# Patient Record
Sex: Female | Born: 1960
Health system: Southern US, Community
[De-identification: ages and names within clinical notes are randomized; demographics above are authoritative.]

## PROBLEM LIST (undated history)

## (undated) DIAGNOSIS — D649 Anemia, unspecified: Secondary | ICD-10-CM

## (undated) DIAGNOSIS — Z923 Personal history of irradiation: Secondary | ICD-10-CM

## (undated) DIAGNOSIS — Z860101 Personal history of adenomatous and serrated colon polyps: Secondary | ICD-10-CM

## (undated) DIAGNOSIS — F32A Depression, unspecified: Secondary | ICD-10-CM

## (undated) DIAGNOSIS — D069 Carcinoma in situ of cervix, unspecified: Secondary | ICD-10-CM

## (undated) DIAGNOSIS — R7303 Prediabetes: Secondary | ICD-10-CM

## (undated) DIAGNOSIS — Z8719 Personal history of other diseases of the digestive system: Secondary | ICD-10-CM

## (undated) DIAGNOSIS — D126 Benign neoplasm of colon, unspecified: Secondary | ICD-10-CM

## (undated) DIAGNOSIS — F329 Major depressive disorder, single episode, unspecified: Secondary | ICD-10-CM

## (undated) DIAGNOSIS — R011 Cardiac murmur, unspecified: Secondary | ICD-10-CM

## (undated) DIAGNOSIS — M4800 Spinal stenosis, site unspecified: Secondary | ICD-10-CM

## (undated) DIAGNOSIS — R52 Pain, unspecified: Secondary | ICD-10-CM

## (undated) DIAGNOSIS — R251 Tremor, unspecified: Secondary | ICD-10-CM

## (undated) DIAGNOSIS — Z8669 Personal history of other diseases of the nervous system and sense organs: Secondary | ICD-10-CM

## (undated) DIAGNOSIS — M4310 Spondylolisthesis, site unspecified: Secondary | ICD-10-CM

## (undated) DIAGNOSIS — Z86010 Personal history of colonic polyps: Secondary | ICD-10-CM

## (undated) DIAGNOSIS — M1990 Unspecified osteoarthritis, unspecified site: Secondary | ICD-10-CM

## (undated) DIAGNOSIS — F419 Anxiety disorder, unspecified: Secondary | ICD-10-CM

## (undated) DIAGNOSIS — E039 Hypothyroidism, unspecified: Secondary | ICD-10-CM

## (undated) DIAGNOSIS — R87613 High grade squamous intraepithelial lesion on cytologic smear of cervix (HGSIL): Secondary | ICD-10-CM

## (undated) DIAGNOSIS — K648 Other hemorrhoids: Secondary | ICD-10-CM

## (undated) DIAGNOSIS — K449 Diaphragmatic hernia without obstruction or gangrene: Secondary | ICD-10-CM

## (undated) DIAGNOSIS — Z800 Family history of malignant neoplasm of digestive organs: Secondary | ICD-10-CM

## (undated) DIAGNOSIS — K5730 Diverticulosis of large intestine without perforation or abscess without bleeding: Secondary | ICD-10-CM

## (undated) DIAGNOSIS — K759 Inflammatory liver disease, unspecified: Secondary | ICD-10-CM

## (undated) DIAGNOSIS — Z853 Personal history of malignant neoplasm of breast: Secondary | ICD-10-CM

## (undated) DIAGNOSIS — I10 Essential (primary) hypertension: Secondary | ICD-10-CM

## (undated) DIAGNOSIS — K219 Gastro-esophageal reflux disease without esophagitis: Secondary | ICD-10-CM

## (undated) DIAGNOSIS — C801 Malignant (primary) neoplasm, unspecified: Secondary | ICD-10-CM

## (undated) HISTORY — DX: Other hemorrhoids: K64.8

## (undated) HISTORY — PX: ESOPHAGOGASTRODUODENOSCOPY: SHX1529

## (undated) HISTORY — PX: WISDOM TOOTH EXTRACTION: SHX21

## (undated) HISTORY — DX: Benign neoplasm of colon, unspecified: D12.6

## (undated) HISTORY — PX: TOOTH EXTRACTION: SUR596

## (undated) HISTORY — DX: Family history of malignant neoplasm of digestive organs: Z80.0

## (undated) HISTORY — DX: High grade squamous intraepithelial lesion on cytologic smear of cervix (HGSIL): R87.613

## (undated) HISTORY — DX: Essential (primary) hypertension: I10

## (undated) HISTORY — DX: Carcinoma in situ of cervix, unspecified: D06.9

## (undated) HISTORY — PX: COLONOSCOPY: SHX174

## (undated) HISTORY — PX: CRANIECTOMY: SHX331

## (undated) HISTORY — DX: Personal history of other diseases of the nervous system and sense organs: Z86.69

## (undated) HISTORY — DX: Pain, unspecified: R52

## (undated) HISTORY — DX: Gastro-esophageal reflux disease without esophagitis: K21.9

---

## 2000-08-16 DIAGNOSIS — C50919 Malignant neoplasm of unspecified site of unspecified female breast: Secondary | ICD-10-CM

## 2000-08-16 HISTORY — DX: Malignant neoplasm of unspecified site of unspecified female breast: C50.919

## 2000-08-16 HISTORY — PX: BREAST LUMPECTOMY: SHX2

## 2001-02-15 DIAGNOSIS — D0590 Unspecified type of carcinoma in situ of unspecified breast: Secondary | ICD-10-CM | POA: Insufficient documentation

## 2004-10-29 ENCOUNTER — Ambulatory Visit: Payer: Self-pay | Admitting: Hematology and Oncology

## 2005-03-02 ENCOUNTER — Ambulatory Visit: Payer: Self-pay | Admitting: Hematology and Oncology

## 2005-06-29 ENCOUNTER — Ambulatory Visit: Payer: Self-pay | Admitting: Hematology and Oncology

## 2005-12-13 ENCOUNTER — Ambulatory Visit: Payer: Self-pay | Admitting: Family Medicine

## 2005-12-23 ENCOUNTER — Ambulatory Visit: Payer: Self-pay | Admitting: Family Medicine

## 2006-01-17 ENCOUNTER — Ambulatory Visit: Payer: Self-pay | Admitting: Hematology and Oncology

## 2006-01-19 LAB — CBC WITH DIFFERENTIAL/PLATELET
BASO%: 0.3 % (ref 0.0–2.0)
EOS%: 2.7 % (ref 0.0–7.0)
Eosinophils Absolute: 0.1 10*3/uL (ref 0.0–0.5)
HCT: 36.6 % (ref 34.8–46.6)
LYMPH%: 37.7 % (ref 14.0–48.0)
MONO#: 0.4 10*3/uL (ref 0.1–0.9)
MONO%: 7.9 % (ref 0.0–13.0)
NEUT#: 2.3 10*3/uL (ref 1.5–6.5)
NEUT%: 51.4 % (ref 39.6–76.8)
lymph#: 1.7 10*3/uL (ref 0.9–3.3)

## 2006-01-19 LAB — CANCER ANTIGEN 27.29: CA 27.29: 13 U/mL (ref 0–39)

## 2006-01-19 LAB — COMPREHENSIVE METABOLIC PANEL
AST: 17 U/L (ref 0–37)
CO2: 28 mEq/L (ref 19–32)
Calcium: 9.2 mg/dL (ref 8.4–10.5)
Creatinine, Ser: 0.85 mg/dL (ref 0.40–1.20)
Potassium: 4 mEq/L (ref 3.5–5.3)
Sodium: 139 mEq/L (ref 135–145)
Total Bilirubin: 0.4 mg/dL (ref 0.3–1.2)

## 2006-01-31 ENCOUNTER — Ambulatory Visit: Payer: Self-pay | Admitting: Sports Medicine

## 2006-02-07 ENCOUNTER — Ambulatory Visit: Payer: Self-pay | Admitting: Family Medicine

## 2006-03-07 ENCOUNTER — Ambulatory Visit: Payer: Self-pay | Admitting: Family Medicine

## 2006-04-04 ENCOUNTER — Ambulatory Visit: Payer: Self-pay | Admitting: Family Medicine

## 2006-04-16 ENCOUNTER — Encounter (INDEPENDENT_AMBULATORY_CARE_PROVIDER_SITE_OTHER): Payer: Self-pay | Admitting: *Deleted

## 2006-04-16 LAB — CONVERTED CEMR LAB

## 2006-05-02 ENCOUNTER — Ambulatory Visit: Payer: Self-pay | Admitting: Family Medicine

## 2006-05-30 ENCOUNTER — Ambulatory Visit: Payer: Self-pay | Admitting: Family Medicine

## 2006-07-18 ENCOUNTER — Ambulatory Visit: Payer: Self-pay | Admitting: Hematology and Oncology

## 2006-07-21 LAB — CBC WITH DIFFERENTIAL/PLATELET
Basophils Absolute: 0 10*3/uL (ref 0.0–0.1)
EOS%: 2.9 % (ref 0.0–7.0)
Eosinophils Absolute: 0.1 10*3/uL (ref 0.0–0.5)
HCT: 39.3 % (ref 34.8–46.6)
HGB: 13.7 g/dL (ref 11.6–15.9)
MCH: 31.3 pg (ref 26.0–34.0)
MCHC: 34.9 g/dL (ref 32.0–36.0)
MONO%: 5.9 % (ref 0.0–13.0)
NEUT%: 55.7 % (ref 39.6–76.8)
Platelets: 291 10*3/uL (ref 145–400)
RBC: 4.38 10*6/uL (ref 3.70–5.32)
RDW: 12.4 % (ref 11.3–14.5)

## 2006-07-21 LAB — COMPREHENSIVE METABOLIC PANEL
BUN: 14 mg/dL (ref 6–23)
Chloride: 103 mEq/L (ref 96–112)
Glucose, Bld: 92 mg/dL (ref 70–99)
Sodium: 141 mEq/L (ref 135–145)
Total Protein: 7.5 g/dL (ref 6.0–8.3)

## 2006-09-12 ENCOUNTER — Encounter: Payer: Self-pay | Admitting: Family Medicine

## 2006-09-12 ENCOUNTER — Ambulatory Visit (HOSPITAL_COMMUNITY): Admission: RE | Admit: 2006-09-12 | Discharge: 2006-09-12 | Payer: Self-pay | Admitting: Family Medicine

## 2006-09-12 ENCOUNTER — Ambulatory Visit: Payer: Self-pay | Admitting: Sports Medicine

## 2006-09-12 LAB — CONVERTED CEMR LAB
HCT: 37.7 % (ref 36.0–46.0)
Hemoglobin: 12.9 g/dL (ref 12.0–15.0)
Platelets: 263 10*3/uL (ref 150–400)
RDW: 12.5 % (ref 11.5–14.0)

## 2006-09-15 ENCOUNTER — Ambulatory Visit: Payer: Self-pay | Admitting: Family Medicine

## 2006-09-15 ENCOUNTER — Encounter: Payer: Self-pay | Admitting: Family Medicine

## 2006-09-15 LAB — CONVERTED CEMR LAB
ALT: 14 units/L (ref 0–35)
Alkaline Phosphatase: 34 units/L — ABNORMAL LOW (ref 39–117)
BUN: 14 mg/dL (ref 6–23)
CO2: 26 meq/L (ref 19–32)
Calcium: 9.4 mg/dL (ref 8.4–10.5)
Chloride: 101 meq/L (ref 96–112)
Glucose, Bld: 85 mg/dL (ref 70–99)
HDL: 65 mg/dL (ref >39)
Potassium: 4.3 meq/L (ref 3.5–5.3)
Sodium: 139 meq/L (ref 135–145)
Total Bilirubin: 0.4 mg/dL (ref 0.3–1.2)
VLDL: 23 mg/dL (ref 0–40)

## 2006-10-12 ENCOUNTER — Ambulatory Visit (HOSPITAL_COMMUNITY): Admission: RE | Admit: 2006-10-12 | Discharge: 2006-10-12 | Payer: Self-pay | Admitting: Sports Medicine

## 2006-10-14 ENCOUNTER — Encounter (INDEPENDENT_AMBULATORY_CARE_PROVIDER_SITE_OTHER): Payer: Self-pay | Admitting: *Deleted

## 2006-10-17 ENCOUNTER — Ambulatory Visit: Payer: Self-pay | Admitting: Family Medicine

## 2006-10-17 DIAGNOSIS — F39 Unspecified mood [affective] disorder: Secondary | ICD-10-CM | POA: Insufficient documentation

## 2006-10-17 DIAGNOSIS — I10 Essential (primary) hypertension: Secondary | ICD-10-CM | POA: Insufficient documentation

## 2006-11-21 ENCOUNTER — Ambulatory Visit: Payer: Self-pay | Admitting: Family Medicine

## 2006-11-21 DIAGNOSIS — K2970 Gastritis, unspecified, without bleeding: Secondary | ICD-10-CM | POA: Insufficient documentation

## 2006-11-21 DIAGNOSIS — K2990 Gastroduodenitis, unspecified, without bleeding: Secondary | ICD-10-CM

## 2007-02-16 ENCOUNTER — Ambulatory Visit: Payer: Self-pay | Admitting: Hematology and Oncology

## 2007-02-23 ENCOUNTER — Encounter: Admission: RE | Admit: 2007-02-23 | Discharge: 2007-02-23 | Payer: Self-pay | Admitting: Hematology and Oncology

## 2007-03-03 LAB — COMPREHENSIVE METABOLIC PANEL
ALT: 25 U/L (ref 0–35)
AST: 21 U/L (ref 0–37)
Albumin: 4.4 g/dL (ref 3.5–5.2)
Alkaline Phosphatase: 48 U/L (ref 39–117)
BUN: 10 mg/dL (ref 6–23)
CO2: 24 mEq/L (ref 19–32)
Creatinine, Ser: 0.74 mg/dL (ref 0.40–1.20)
Glucose, Bld: 106 mg/dL — ABNORMAL HIGH (ref 70–99)
Total Protein: 7.6 g/dL (ref 6.0–8.3)

## 2007-03-03 LAB — CBC WITH DIFFERENTIAL/PLATELET
EOS%: 2.6 % (ref 0.0–7.0)
MCHC: 35.2 g/dL (ref 32.0–36.0)
MONO#: 0.3 10*3/uL (ref 0.1–0.9)
MONO%: 7.1 % (ref 0.0–13.0)
NEUT#: 2.6 10*3/uL (ref 1.5–6.5)
NEUT%: 59.4 % (ref 39.6–76.8)
lymph#: 1.4 10*3/uL (ref 0.9–3.3)

## 2007-04-06 ENCOUNTER — Encounter (INDEPENDENT_AMBULATORY_CARE_PROVIDER_SITE_OTHER): Payer: Self-pay | Admitting: Family Medicine

## 2007-04-06 ENCOUNTER — Telehealth (INDEPENDENT_AMBULATORY_CARE_PROVIDER_SITE_OTHER): Payer: Self-pay | Admitting: *Deleted

## 2007-04-06 ENCOUNTER — Ambulatory Visit: Payer: Self-pay | Admitting: Family Medicine

## 2007-04-06 DIAGNOSIS — F329 Major depressive disorder, single episode, unspecified: Secondary | ICD-10-CM

## 2007-04-06 DIAGNOSIS — R5383 Other fatigue: Secondary | ICD-10-CM | POA: Insufficient documentation

## 2007-04-07 LAB — CONVERTED CEMR LAB
Basophils Absolute: 0 10*3/uL (ref 0.0–0.1)
Eosinophils Relative: 2 % (ref 0–5)
HCT: 38.3 % (ref 36.0–46.0)
Hemoglobin: 12.7 g/dL (ref 12.0–15.0)
RBC: 4.25 M/uL (ref 3.87–5.11)

## 2007-07-18 ENCOUNTER — Ambulatory Visit: Payer: Self-pay | Admitting: Family Medicine

## 2007-07-18 DIAGNOSIS — M7710 Lateral epicondylitis, unspecified elbow: Secondary | ICD-10-CM | POA: Insufficient documentation

## 2007-07-18 DIAGNOSIS — N912 Amenorrhea, unspecified: Secondary | ICD-10-CM | POA: Insufficient documentation

## 2007-07-19 ENCOUNTER — Emergency Department (HOSPITAL_COMMUNITY): Admission: EM | Admit: 2007-07-19 | Discharge: 2007-07-19 | Payer: Self-pay | Admitting: Emergency Medicine

## 2007-07-31 ENCOUNTER — Ambulatory Visit: Payer: Self-pay | Admitting: Family Medicine

## 2007-07-31 DIAGNOSIS — G5600 Carpal tunnel syndrome, unspecified upper limb: Secondary | ICD-10-CM | POA: Insufficient documentation

## 2007-07-31 LAB — CONVERTED CEMR LAB
ALT: 24 units/L (ref 0–35)
AST: 18 units/L (ref 0–37)
Alkaline Phosphatase: 58 units/L (ref 39–117)
BUN: 15 mg/dL (ref 6–23)
CO2: 27 meq/L (ref 19–32)
Creatinine, Ser: 0.7 mg/dL (ref 0.40–1.20)
Glucose, Bld: 115 mg/dL — ABNORMAL HIGH (ref 70–99)
Potassium: 4 meq/L (ref 3.5–5.3)
Sodium: 139 meq/L (ref 135–145)

## 2007-08-02 ENCOUNTER — Encounter: Admission: RE | Admit: 2007-08-02 | Discharge: 2007-08-02 | Payer: Self-pay | Admitting: Family Medicine

## 2007-08-02 ENCOUNTER — Encounter: Payer: Self-pay | Admitting: Family Medicine

## 2007-08-21 ENCOUNTER — Encounter (INDEPENDENT_AMBULATORY_CARE_PROVIDER_SITE_OTHER): Payer: Self-pay | Admitting: *Deleted

## 2007-08-29 ENCOUNTER — Ambulatory Visit: Payer: Self-pay | Admitting: Hematology and Oncology

## 2007-08-31 LAB — LACTATE DEHYDROGENASE: LDH: 142 U/L (ref 94–250)

## 2007-08-31 LAB — CBC WITH DIFFERENTIAL/PLATELET
Basophils Absolute: 0 10*3/uL (ref 0.0–0.1)
Eosinophils Absolute: 0.1 10*3/uL (ref 0.0–0.5)
HCT: 36.3 % (ref 34.8–46.6)
HGB: 12.8 g/dL (ref 11.6–15.9)
LYMPH%: 37.5 % (ref 14.0–48.0)
MONO#: 0.3 10*3/uL (ref 0.1–0.9)
NEUT#: 2.6 10*3/uL (ref 1.5–6.5)
NEUT%: 52.5 % (ref 39.6–76.8)
Platelets: 300 10*3/uL (ref 145–400)
RBC: 4.14 10*6/uL (ref 3.70–5.32)
WBC: 5 10*3/uL (ref 3.9–10.0)

## 2007-08-31 LAB — COMPREHENSIVE METABOLIC PANEL
CO2: 24 mEq/L (ref 19–32)
Glucose, Bld: 94 mg/dL (ref 70–99)
Sodium: 137 mEq/L (ref 135–145)
Total Bilirubin: 0.3 mg/dL (ref 0.3–1.2)
Total Protein: 7.5 g/dL (ref 6.0–8.3)

## 2007-08-31 LAB — CANCER ANTIGEN 27.29: CA 27.29: 22 U/mL (ref 0–39)

## 2007-10-26 ENCOUNTER — Emergency Department (HOSPITAL_COMMUNITY): Admission: EM | Admit: 2007-10-26 | Discharge: 2007-10-26 | Payer: Self-pay | Admitting: Emergency Medicine

## 2007-12-21 ENCOUNTER — Emergency Department (HOSPITAL_COMMUNITY): Admission: EM | Admit: 2007-12-21 | Discharge: 2007-12-21 | Payer: Self-pay | Admitting: Family Medicine

## 2007-12-22 ENCOUNTER — Telehealth: Payer: Self-pay | Admitting: *Deleted

## 2008-01-15 ENCOUNTER — Telehealth: Payer: Self-pay | Admitting: *Deleted

## 2008-01-17 ENCOUNTER — Ambulatory Visit: Payer: Self-pay | Admitting: Family Medicine

## 2008-01-17 DIAGNOSIS — H9190 Unspecified hearing loss, unspecified ear: Secondary | ICD-10-CM | POA: Insufficient documentation

## 2008-02-19 ENCOUNTER — Ambulatory Visit: Payer: Self-pay | Admitting: Hematology and Oncology

## 2008-03-25 ENCOUNTER — Encounter: Admission: RE | Admit: 2008-03-25 | Discharge: 2008-03-25 | Payer: Self-pay | Admitting: Hematology and Oncology

## 2008-04-08 ENCOUNTER — Ambulatory Visit: Payer: Self-pay | Admitting: Hematology and Oncology

## 2008-07-22 ENCOUNTER — Ambulatory Visit: Payer: Self-pay | Admitting: Family Medicine

## 2008-07-22 LAB — CONVERTED CEMR LAB
Alkaline Phosphatase: 56 units/L (ref 39–117)
Creatinine, Ser: 1.37 mg/dL — ABNORMAL HIGH (ref 0.40–1.20)
HCT: 38.8 % (ref 36.0–46.0)
Hemoglobin: 13.2 g/dL (ref 12.0–15.0)
MCHC: 34 g/dL (ref 30.0–36.0)
Platelets: 315 10*3/uL (ref 150–400)
Sodium: 140 meq/L (ref 135–145)
Total Protein: 8 g/dL (ref 6.0–8.3)
WBC: 5.6 10*3/uL (ref 4.0–10.5)

## 2008-07-23 ENCOUNTER — Encounter: Payer: Self-pay | Admitting: Family Medicine

## 2008-08-20 ENCOUNTER — Ambulatory Visit: Payer: Self-pay | Admitting: Family Medicine

## 2008-08-20 ENCOUNTER — Encounter: Payer: Self-pay | Admitting: Family Medicine

## 2008-08-20 LAB — CONVERTED CEMR LAB
BUN: 20 mg/dL (ref 6–23)
CO2: 24 meq/L (ref 19–32)
Chloride: 103 meq/L (ref 96–112)
Creatinine, Ser: 0.68 mg/dL (ref 0.40–1.20)
Glucose, Bld: 95 mg/dL (ref 70–99)
Pap Smear: NORMAL

## 2008-08-22 ENCOUNTER — Encounter: Payer: Self-pay | Admitting: Family Medicine

## 2008-12-24 ENCOUNTER — Ambulatory Visit: Payer: Self-pay | Admitting: Family Medicine

## 2008-12-24 LAB — CONVERTED CEMR LAB
Bilirubin Urine: NEGATIVE
Glucose, Urine, Semiquant: NEGATIVE
Nitrite: NEGATIVE
Specific Gravity, Urine: 1.015

## 2008-12-25 ENCOUNTER — Encounter: Admission: RE | Admit: 2008-12-25 | Discharge: 2008-12-25 | Payer: Self-pay | Admitting: Family Medicine

## 2009-01-05 ENCOUNTER — Encounter: Payer: Self-pay | Admitting: Family Medicine

## 2009-04-17 ENCOUNTER — Encounter: Admission: RE | Admit: 2009-04-17 | Discharge: 2009-04-17 | Payer: Self-pay | Admitting: Family Medicine

## 2009-08-06 ENCOUNTER — Encounter: Payer: Self-pay | Admitting: Family Medicine

## 2009-08-07 ENCOUNTER — Encounter: Payer: Self-pay | Admitting: Family Medicine

## 2009-08-07 ENCOUNTER — Ambulatory Visit: Payer: Self-pay | Admitting: Family Medicine

## 2009-08-07 DIAGNOSIS — IMO0001 Reserved for inherently not codable concepts without codable children: Secondary | ICD-10-CM | POA: Insufficient documentation

## 2009-08-07 DIAGNOSIS — E785 Hyperlipidemia, unspecified: Secondary | ICD-10-CM | POA: Insufficient documentation

## 2009-08-07 LAB — CONVERTED CEMR LAB
ALT: 37 units/L — ABNORMAL HIGH (ref 0–35)
Alkaline Phosphatase: 60 units/L (ref 39–117)
CO2: 22 meq/L (ref 19–32)
Chloride: 100 meq/L (ref 96–112)
Glucose, Bld: 96 mg/dL (ref 70–99)
HCT: 38 % (ref 36.0–46.0)
MCHC: 35.5 g/dL (ref 30.0–36.0)
MCV: 88.4 fL (ref 78.0–100.0)
Platelets: 320 10*3/uL (ref 150–400)
Total Protein: 7.8 g/dL (ref 6.0–8.3)
VLDL: 16 mg/dL (ref 0–40)
WBC: 4.3 10*3/uL (ref 4.0–10.5)

## 2009-08-22 ENCOUNTER — Encounter: Payer: Self-pay | Admitting: Family Medicine

## 2010-02-04 ENCOUNTER — Telehealth: Payer: Self-pay | Admitting: Family Medicine

## 2010-02-04 ENCOUNTER — Ambulatory Visit: Payer: Self-pay | Admitting: Family Medicine

## 2010-02-04 DIAGNOSIS — R112 Nausea with vomiting, unspecified: Secondary | ICD-10-CM | POA: Insufficient documentation

## 2010-02-04 DIAGNOSIS — R51 Headache: Secondary | ICD-10-CM | POA: Insufficient documentation

## 2010-02-04 DIAGNOSIS — R519 Headache, unspecified: Secondary | ICD-10-CM | POA: Insufficient documentation

## 2010-04-07 ENCOUNTER — Telehealth: Payer: Self-pay | Admitting: Family Medicine

## 2010-08-12 ENCOUNTER — Encounter
Admission: RE | Admit: 2010-08-12 | Discharge: 2010-08-12 | Payer: Self-pay | Source: Home / Self Care | Attending: Family Medicine | Admitting: Family Medicine

## 2010-09-06 ENCOUNTER — Encounter: Payer: Self-pay | Admitting: Family Medicine

## 2010-09-15 NOTE — Progress Notes (Signed)
Summary: triage  Phone Note Call from Patient Call back at Home Phone [phone]   Caller: Patient Summary of Call: not feeling well and wants to come in Initial call taken by: De Nurse,  February 04, 2010 10:40 AM  Follow-up for Phone Call        HA, dizzy, nausea. last bp meds were last night. tylenol yesterday for HA.  crying & says she feels sad. parents are in home with her. sad x 1 day. wants to be seen today. told her to be here at 2:30 to 3pm for a work in. she is aware there will be a wait & that her md will not be here Follow-up by: Golden Circle RN,  February 04, 2010 10:53 AM

## 2010-09-15 NOTE — Progress Notes (Signed)
Summary: refill  Phone Note Refill Request Call back at Home Phone [phone] Message from:  Patient  Refills Requested: Medication #1:  EFFEXOR XR 150 MG XR24H-CAP Take one tab daily   Supply Requested: 3 months   Notes: mail order [facility] [address] - pt needs 2 wks worth- enough to get it from mail order - she is out  Initial call taken by: De Nurse,  April 07, 2010 11:13 AM  Follow-up for Phone Call        Done Follow-up by: Zachery Dauer MD,  April 08, 2010 1:53 PM
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
## 2010-09-15 NOTE — Letter (Signed)
Summary: Results Follow-up Letter  [facility]  [address]   [place], [place] [address]   Phone: [phone]  Fax: [phone]    08/22/2009  [address] [place], [place]  [address]  Dear [name],   The following are the results of your recent test(s): Patient: Sherry Anderson Note: All result statuses are Final unless otherwise noted.  Tests: (1) CBC NO Diff (Complete Blood Count) (10000)   Order Note: FASTING   WBC                       4.3 K/uL                    4.0-10.5   RBC                       4.30 MIL/uL                 3.87-5.11   Hemoglobin                13.5 g/dL                   62.9-52.8   Hematocrit                38.0 %                      36.0-46.0   MCV                       88.4 fL                     78.0-100.0   MCHC                      35.5 g/dL                   41.3-24.4   RDW                       13.4 %                      11.5-15.5   Platelet Count            320 K/uL                    150-400 No padece de anemia Tests: (2) Comprehensive Metabolic Panel (01027)   Sodium                    138 mEq/L                   135-145   Potassium                 3.8 mEq/L                   3.5-5.3   Chloride                  100 mEq/L                   96-112   CO2                       22 mEq/L  19-32   Glucose                   96 mg/dL                    84-69   BUN                       15 mg/dL                    6-29   Creatinine                0.73 mg/dL                  0.40-1.20   Bilirubin, Total          0.6 mg/dL                   5.2-8.4   Alkaline Phosphatase      60 U/L                      39-117   AST/SGOT                  26 U/L                      0-37   ALT/SGPT             [H]  37 U/L                      0-35   Total Protein             7.8 g/dL                    1.3-2.4   Albumin                   4.6 g/dL                    4.0-1.0   Calcium                   9.2 mg/dL                    2.7-25.3 [place] estan normal Tests: (3) Lipid Profile (66440)   Cholesterol          [H]  216 mg/dL                   3-474     ATP III Classification:           < 200        mg/dL        Desirable          200 - 239     mg/dL        Borderline High          >= 240        mg/dL        High         Triglyceride              81 mg/dL                    <259   HDL Cholesterol           59  mg/dL                    >98   Total Chol/HDL Ratio      3.7 Ratio  VLDL Cholesterol (Calc)                             16 mg/dL                    1-19  LDL Cholesterol (Calc)                        [H]  141 mg/dL                   1-47    Cholesterol LDL(Bad cholesterol):          Su meta es menos de: 130        HDL (Good cholesterol):        Your goal is more than: 45 Baje la grasa saturada en su dieta.   _________________________________________________________ Other Tests: Tests: (4) CK, Total (82956)   CK, Total                 74 U/L                      7-177_  No hay enfermedad de los musculos _________________________________________________________  Sincerely,  Zachery Dauer MD [facility]           Appended Document: Results Follow-up Letter mailed.

## 2010-12-23 ENCOUNTER — Ambulatory Visit (INDEPENDENT_AMBULATORY_CARE_PROVIDER_SITE_OTHER): Payer: Managed Care, Other (non HMO) | Admitting: Family Medicine

## 2010-12-23 ENCOUNTER — Encounter: Payer: Self-pay | Admitting: Family Medicine

## 2010-12-23 VITALS — BP 118/75 | HR 71 | Temp 98.1°F | Wt 150.0 lb

## 2010-12-23 DIAGNOSIS — R51 Headache: Secondary | ICD-10-CM

## 2010-12-24 NOTE — Assessment & Plan Note (Signed)
Likely mild tension headache. No red flags. Does not fit cluster or migraine pattern. Advised treat with Tylenol. Reviewed red flags. Follow up with PCP as needed.

## 2010-12-24 NOTE — Progress Notes (Signed)
  Subjective:    Patient ID: Sherry Anderson, female    DOB: [birth year], 50 y.o.   MRN: [id number]  HPI  Reports intermittent headache which is bi-temporal, not "pounding", is "dull", last for a few hours then resolves, but she has not taken anything for this headache and the headache does not wake her from sleep and headache is not severe. Also reports  itching canals of bilateral ears over past two weeks. Reports "dry throat" over past two weeks.  Denies cough, runny nose, ear pain, ear discharge, hearing loss, fever, vision change, conjunctivitis or eye itching, focal neurological signs, sinus pain or pressure .   Pertinent past history reviewed  Review of Systems As per HPI     Objective:   Physical Exam  Constitutional: She appears well-developed and well-nourished. No distress.  HENT:  Right Ear: Hearing, tympanic membrane, external ear and ear canal normal. No drainage or tenderness. No foreign bodies.  Left Ear: Hearing, tympanic membrane, external ear and ear canal normal. No drainage or tenderness. No foreign bodies.  Nose: Nose normal. No mucosal edema, rhinorrhea or sinus tenderness. Right sinus exhibits no maxillary sinus tenderness and no frontal sinus tenderness. Left sinus exhibits no maxillary sinus tenderness and no frontal sinus tenderness.  Mouth/Throat: Uvula is midline, oropharynx is clear and moist and mucous membranes are normal. No oropharyngeal exudate, posterior oropharyngeal edema or posterior oropharyngeal erythema.       No tenderness with tragal tension bilaterally   Neck: Normal range of motion. No mass and no thyromegaly present.  Cardiovascular: Normal rate and regular rhythm.   Pulmonary/Chest: Effort normal and breath sounds normal.  Lymphadenopathy:    She has no cervical adenopathy.  Skin: Skin is warm and dry.          Assessment & Plan:

## 2011-05-10 LAB — URINALYSIS, ROUTINE W REFLEX MICROSCOPIC
Hgb urine dipstick: NEGATIVE
Nitrite: NEGATIVE
Protein, ur: NEGATIVE
Urobilinogen, UA: 1

## 2011-05-10 LAB — POCT CARDIAC MARKERS
CKMB, poc: <1 — ABNORMAL LOW
CKMB, poc: <1 — ABNORMAL LOW
Operator id: 270651
Troponin i, poc: <0.05

## 2011-05-10 LAB — I-STAT 8, (EC8 V) (CONVERTED LAB)
Glucose, Bld: 130 — ABNORMAL HIGH
Potassium: 3 — ABNORMAL LOW
TCO2: 23
pCO2, Ven: 24.2 — ABNORMAL LOW
pH, Ven: 7.569 — ABNORMAL HIGH

## 2011-05-10 LAB — CBC
HCT: 37.5
Hemoglobin: 13.2
MCV: 90.4
Platelets: 253
RDW: 13.1

## 2011-05-10 LAB — POCT I-STAT CREATININE: Operator id: 270651

## 2011-05-10 LAB — HEPATIC FUNCTION PANEL
ALT: 56 — ABNORMAL HIGH
AST: 92 — ABNORMAL HIGH
Bilirubin, Direct: 0.2
Indirect Bilirubin: 0.8
Total Bilirubin: 1

## 2011-05-10 LAB — DIFFERENTIAL
Basophils Absolute: 0
Eosinophils Absolute: 0.2
Eosinophils Relative: 3
Lymphocytes Relative: 43
Lymphs Abs: 2.2
Monocytes Absolute: 0.3

## 2011-09-02 ENCOUNTER — Other Ambulatory Visit: Payer: Self-pay | Admitting: Family Medicine

## 2011-09-02 MED ORDER — TRIAMTERENE-HCTZ 37.5-25 MG PO CAPS: 1.0000 | ORAL_CAPSULE | ORAL | Status: DC

## 2011-09-02 NOTE — Telephone Encounter (Signed)
Fax sent back with one refill for 90 with no further refills until seen since no blood tests in the past years Sent to [facility]

## 2011-09-06 ENCOUNTER — Other Ambulatory Visit: Payer: Self-pay | Admitting: Family Medicine

## 2011-09-06 MED ORDER — VENLAFAXINE HCL ER 150 MG PO CP24: 150.0000 mg | ORAL_CAPSULE | Freq: Every day | ORAL | Status: DC

## 2011-09-06 MED ORDER — LISINOPRIL 20 MG PO TABS: 20.0000 mg | ORAL_TABLET | Freq: Every day | ORAL | Status: DC

## 2011-09-06 NOTE — Telephone Encounter (Signed)
[employer] faxes for Omeprazole and Venlafaxine completed for 90 days with note that no more refills until seen.

## 2011-09-10 ENCOUNTER — Other Ambulatory Visit: Payer: Self-pay | Admitting: Family Medicine

## 2011-09-10 MED ORDER — TRIAMTERENE-HCTZ 37.5-25 MG PO CAPS: 1.0000 | ORAL_CAPSULE | ORAL | Status: DC

## 2011-09-10 NOTE — Telephone Encounter (Signed)
Faxed with note that she needs to be seen before further refills. Apparently I didn't sign the prior refill.

## 2011-09-24 ENCOUNTER — Other Ambulatory Visit: Payer: Self-pay | Admitting: Family Medicine

## 2011-09-24 DIAGNOSIS — Z1231 Encounter for screening mammogram for malignant neoplasm of breast: Secondary | ICD-10-CM

## 2011-10-06 ENCOUNTER — Other Ambulatory Visit: Payer: Self-pay | Admitting: Family Medicine

## 2011-10-14 ENCOUNTER — Ambulatory Visit
Admission: RE | Admit: 2011-10-14 | Discharge: 2011-10-14 | Disposition: A | Discharge status: Home / Self Care | Payer: Managed Care, Other (non HMO) | Source: Ambulatory Visit | Attending: Family Medicine | Admitting: Family Medicine

## 2011-10-14 DIAGNOSIS — Z1231 Encounter for screening mammogram for malignant neoplasm of breast: Secondary | ICD-10-CM

## 2012-01-05 ENCOUNTER — Telehealth: Payer: Self-pay | Admitting: Family Medicine

## 2012-01-05 NOTE — Telephone Encounter (Signed)
Fax requests for refills of Maxide and Venlafaxine denied due to no visits of labs in over a year.

## 2012-01-17 ENCOUNTER — Ambulatory Visit: Payer: Managed Care, Other (non HMO)

## 2012-01-19 ENCOUNTER — Ambulatory Visit (INDEPENDENT_AMBULATORY_CARE_PROVIDER_SITE_OTHER): Payer: Managed Care, Other (non HMO) | Admitting: Family Medicine

## 2012-01-19 ENCOUNTER — Encounter: Payer: Self-pay | Admitting: Family Medicine

## 2012-01-19 VITALS — BP 118/77 | HR 88 | Temp 97.7°F | Ht 63.25 in | Wt 155.8 lb

## 2012-01-19 DIAGNOSIS — D649 Anemia, unspecified: Secondary | ICD-10-CM

## 2012-01-19 DIAGNOSIS — H9201 Otalgia, right ear: Secondary | ICD-10-CM

## 2012-01-19 DIAGNOSIS — H9209 Otalgia, unspecified ear: Secondary | ICD-10-CM

## 2012-01-19 DIAGNOSIS — I10 Essential (primary) hypertension: Secondary | ICD-10-CM

## 2012-01-19 NOTE — Progress Notes (Signed)
  Subjective:  1. Right ear pain, right throat pain   Sherry Anderson is a 51 y.o. female who presents with ear pain and possible ear infection. Symptoms include: right ear pain, sore throat and swollen glands. Onset of symptoms was 3 days ago, and have been gradually worsening since that time. Associated symptoms include: none.  Patient denies: chills, congestion, coryza, fever , headache, non productive cough, post nasal drip, productive cough, sinus pressure and sneezing. She is drinking plenty of fluids.  Review of Systems Pertinent items are noted in HPI.  Objective:    BP 118/77  Pulse 88  Temp(Src) 97.7 F (36.5 C) (Oral)  Ht 5' 3.25" (1.607 m)  Wt 155 lb 12.8 oz (70.67 kg)  BMI 27.38 kg/m2 General:  alert and cooperative  Right Ear: normal landmarks and mobility ear canal normal without redness  Left Ear: normal landmarks and mobility  Mouth:  lips, mucosa, and tongue normal; teeth and gums normal  Neck: no adenopathy, no carotid bruit, no JVD, supple, symmetrical, trachea midline and thyroid not enlarged, symmetric, no tenderness/mass/nodules     Assessment:

## 2012-01-19 NOTE — Assessment & Plan Note (Signed)
No sign of infection in ear/throat/sinus. No allergy symptoms.  I think she may have a atypical viral illness.  Supportive treatment with saline gargles and warmth/fluids.

## 2012-01-24 ENCOUNTER — Encounter: Payer: Self-pay | Admitting: Family Medicine

## 2012-01-24 ENCOUNTER — Ambulatory Visit (INDEPENDENT_AMBULATORY_CARE_PROVIDER_SITE_OTHER): Payer: Managed Care, Other (non HMO) | Admitting: Family Medicine

## 2012-01-24 VITALS — BP 132/81 | HR 60 | Ht 63.25 in | Wt 157.0 lb

## 2012-01-24 DIAGNOSIS — D649 Anemia, unspecified: Secondary | ICD-10-CM

## 2012-01-24 DIAGNOSIS — K2960 Other gastritis without bleeding: Secondary | ICD-10-CM

## 2012-01-24 DIAGNOSIS — R109 Unspecified abdominal pain: Secondary | ICD-10-CM | POA: Insufficient documentation

## 2012-01-24 DIAGNOSIS — F329 Major depressive disorder, single episode, unspecified: Secondary | ICD-10-CM

## 2012-01-24 DIAGNOSIS — I10 Essential (primary) hypertension: Secondary | ICD-10-CM

## 2012-01-24 DIAGNOSIS — D0590 Unspecified type of carcinoma in situ of unspecified breast: Secondary | ICD-10-CM

## 2012-01-24 DIAGNOSIS — H9201 Otalgia, right ear: Secondary | ICD-10-CM

## 2012-01-24 DIAGNOSIS — R51 Headache: Secondary | ICD-10-CM

## 2012-01-24 DIAGNOSIS — H9209 Otalgia, unspecified ear: Secondary | ICD-10-CM

## 2012-01-24 LAB — POCT URINALYSIS DIPSTICK
Bilirubin, UA: NEGATIVE
Blood, UA: NEGATIVE
Glucose, UA: NEGATIVE
Spec Grav, UA: 1.02

## 2012-01-24 MED ORDER — TRIAMTERENE-HCTZ 37.5-25 MG PO CAPS: 1.0000 | ORAL_CAPSULE | Freq: Every day | ORAL | Status: DC

## 2012-01-24 MED ORDER — OMEPRAZOLE 20 MG PO CPDR: 20.0000 mg | DELAYED_RELEASE_CAPSULE | Freq: Two times a day (BID) | ORAL | Status: DC

## 2012-01-24 MED ORDER — VENLAFAXINE HCL ER 150 MG PO CP24: 150.0000 mg | ORAL_CAPSULE | Freq: Every day | ORAL | Status: DC

## 2012-01-24 MED ORDER — LISINOPRIL 20 MG PO TABS: 20.0000 mg | ORAL_TABLET | Freq: Every day | ORAL | Status: DC

## 2012-01-24 NOTE — Assessment & Plan Note (Signed)
I considered renal colic as the cause of her pains, but no hematuria

## 2012-01-24 NOTE — Assessment & Plan Note (Signed)
Controlled by Omeprazole.

## 2012-01-24 NOTE — Assessment & Plan Note (Signed)
Still controlled one week off of the HCTZ. No proteinuria to explain the facial puffiness

## 2012-01-24 NOTE — Assessment & Plan Note (Signed)
May be component of TMJ syndrome

## 2012-01-24 NOTE — Patient Instructions (Addendum)
We will continue your blood pressure medicines the same, unless the lab tests indicate that we need to change.   Please return to see Dr Sheffield Slider in 3 months  Favor de regresar en 3 meses.  Voy a [employer] [place] [place]. I'll send you the results of your tests.

## 2012-01-24 NOTE — Assessment & Plan Note (Signed)
Due to TMJ? No otitis

## 2012-01-24 NOTE — Progress Notes (Signed)
  Subjective:    Patient ID: Sherry Anderson, female    DOB: 17-Jun-1961, 51 y.o.   MRN: [id number]  HPI 7 days of facial and ankle edema since she has been off the Triamterene/HCTZ which I wouldn't refill until she came in for labs.   Syncopal episode after severe midabdominal pain with sweating that was gone when she awakened. She bruised her left cheek and R knee.   Intermittent LUQ pain not associated with eating or movement. No dysuria, constipation, or diarrhea.   She continues headaches. Ear pain is better, but it recurs.   Missed one day of Venlafaxine, very anxious, and isn't interested in weaning off of it.  Review of Systems     see HPI for ROS  Objective:   Physical Exam  Constitutional: She is oriented to person, place, and time. She appears well-developed and well-nourished.       She does appear puffy in the face  HENT:  Right Ear: External ear normal.  Left Ear: External ear normal.  Eyes:       Fundi normal, disks flat.   Cardiovascular: Normal rate and regular rhythm.   Pulmonary/Chest: Effort normal and breath sounds normal.  Abdominal: Soft. She exhibits no distension and no mass. There is no tenderness. There is no guarding.  Musculoskeletal: She exhibits no edema.  Neurological: She is alert and oriented to person, place, and time.  Psychiatric:       Mildly dysphoric affect as usual          Assessment & Plan:

## 2012-01-24 NOTE — Assessment & Plan Note (Signed)
She wishes to continue the Venlafaxine

## 2012-01-25 ENCOUNTER — Encounter: Payer: Self-pay | Admitting: Family Medicine

## 2012-01-25 DIAGNOSIS — D649 Anemia, unspecified: Secondary | ICD-10-CM | POA: Insufficient documentation

## 2012-01-25 LAB — COMPREHENSIVE METABOLIC PANEL
Albumin: 4 g/dL (ref 3.5–5.2)
BUN: 12 mg/dL (ref 6–23)
Calcium: 9.1 mg/dL (ref 8.4–10.5)
Chloride: 108 mEq/L (ref 96–112)
Glucose, Bld: 87 mg/dL (ref 70–99)
Potassium: 3.8 mEq/L (ref 3.5–5.3)

## 2012-01-25 LAB — CBC
Hemoglobin: 11.1 g/dL — ABNORMAL LOW (ref 12.0–15.0)
RBC: 3.71 MIL/uL — ABNORMAL LOW (ref 3.87–5.11)

## 2012-01-25 NOTE — Assessment & Plan Note (Signed)
Creatinine, K, and UA are normal

## 2012-01-25 NOTE — Assessment & Plan Note (Signed)
She had been given Guaiac cards. Letter sent to come in for CBC in about a month to see if back to normal.

## 2012-02-07 ENCOUNTER — Telehealth: Payer: Self-pay | Admitting: Family Medicine

## 2012-02-07 DIAGNOSIS — I10 Essential (primary) hypertension: Secondary | ICD-10-CM

## 2012-02-07 MED ORDER — TRIAMTERENE-HCTZ 37.5-25 MG PO CAPS: 1.0000 | ORAL_CAPSULE | Freq: Every day | ORAL | Status: DC

## 2012-02-07 NOTE — Telephone Encounter (Signed)
Patient is calling because [facility] [address] did not receive the Rx for triamterene-hydrochlorothiazide And she needs her medication.  Please call her when this has been addressed.

## 2012-02-07 NOTE — Telephone Encounter (Signed)
Called pt/ informed meds are at the pharmacy. Lorenda Hatchet, Renato Battles

## 2012-02-24 ENCOUNTER — Other Ambulatory Visit: Payer: Self-pay | Admitting: Family Medicine

## 2012-02-24 DIAGNOSIS — I10 Essential (primary) hypertension: Secondary | ICD-10-CM

## 2012-02-24 MED ORDER — OMEPRAZOLE 20 MG PO CPDR: 20.0000 mg | DELAYED_RELEASE_CAPSULE | Freq: Two times a day (BID) | ORAL | Status: DC

## 2012-02-24 MED ORDER — VENLAFAXINE HCL ER 150 MG PO CP24: 150.0000 mg | ORAL_CAPSULE | Freq: Every day | ORAL | Status: DC

## 2012-02-24 MED ORDER — TRIAMTERENE-HCTZ 37.5-25 MG PO CAPS: 1.0000 | ORAL_CAPSULE | Freq: Every day | ORAL | Status: DC

## 2012-02-24 NOTE — Telephone Encounter (Signed)
The prescriptions were printed and attached to the forms from [facility] Prescriptions to be faxed to them

## 2012-03-03 LAB — HEMOCCULT GUIAC POC 1CARD (OFFICE)
Card #3 Fecal Occult Blood, POC: NEGATIVE
Fecal Occult Blood, POC: NEGATIVE

## 2012-03-03 NOTE — Addendum Note (Signed)
Addended by: [place], Brayton Baumgartner on: 03/03/2012 05:37 PM   Modules accepted: Orders

## 2012-03-09 ENCOUNTER — Encounter: Payer: Self-pay | Admitting: Family Medicine

## 2012-03-30 ENCOUNTER — Ambulatory Visit (HOSPITAL_COMMUNITY)
Admission: RE | Admit: 2012-03-30 | Discharge: 2012-03-30 | Disposition: A | Discharge status: Home / Self Care | Payer: Managed Care, Other (non HMO) | Source: Ambulatory Visit | Attending: Family Medicine | Admitting: Family Medicine

## 2012-03-30 ENCOUNTER — Telehealth: Payer: Self-pay | Admitting: *Deleted

## 2012-03-30 ENCOUNTER — Ambulatory Visit (INDEPENDENT_AMBULATORY_CARE_PROVIDER_SITE_OTHER): Payer: Managed Care, Other (non HMO) | Admitting: Family Medicine

## 2012-03-30 VITALS — BP 144/90 | HR 60 | Ht 63.25 in | Wt 158.4 lb

## 2012-03-30 DIAGNOSIS — I498 Other specified cardiac arrhythmias: Secondary | ICD-10-CM | POA: Insufficient documentation

## 2012-03-30 DIAGNOSIS — I209 Angina pectoris, unspecified: Secondary | ICD-10-CM

## 2012-03-30 DIAGNOSIS — R079 Chest pain, unspecified: Secondary | ICD-10-CM

## 2012-03-30 DIAGNOSIS — I208 Other forms of angina pectoris: Secondary | ICD-10-CM | POA: Insufficient documentation

## 2012-03-30 DIAGNOSIS — I2089 Other forms of angina pectoris: Secondary | ICD-10-CM

## 2012-03-30 DIAGNOSIS — I10 Essential (primary) hypertension: Secondary | ICD-10-CM

## 2012-03-30 DIAGNOSIS — R9431 Abnormal electrocardiogram [ECG] [EKG]: Secondary | ICD-10-CM | POA: Insufficient documentation

## 2012-03-30 MED ORDER — ASPIRIN EC 81 MG PO TBEC: 81.0000 mg | DELAYED_RELEASE_TABLET | Freq: Every day | ORAL | Status: AC

## 2012-03-30 NOTE — Assessment & Plan Note (Signed)
A: elevated. Improved with rest to 144/90.  Meds: compliant  P: continue current management. Close f/u with PCP. I believe anxiety is playing a major role in her presentation Mild to moderate exercise (stress relief) recommended.

## 2012-03-30 NOTE — Assessment & Plan Note (Addendum)
A: sinus bradycardia on EKG. No evidence of old or current ischemia. Patient's symptoms concerning for angina vs anxiety. Her CAD risk factors include HTN and HLD. P: Cardiology referral for exercise stress test  Start daily aspirin  Hold on BB for pending EST and because of sinus bradycardia Close f/u with PCP in two weeks

## 2012-03-30 NOTE — Telephone Encounter (Signed)
Patient calls reporting BP elevation past 2 days 168/100 to 184/100. She is taking medication as directed. . Denies chest pain or headache, sometimes has vision problem. Appointment scheduled today for Work in appointment.

## 2012-03-30 NOTE — Patient Instructions (Signed)
Sherry Anderson,  Thank you for coming in today. Your blood pressure has improved to 144/90. We will get an EKG today. I am sending you for an exercise stress test because of your symptoms of chest pain and shortness of breath with exertion.  Additionally, please start a daily aspirin.   Please f/u in 2 weeks with Dr. Sheffield Slider. He recommend 04/10/12 late [facility].   Dr. Armen Pickup

## 2012-03-30 NOTE — Progress Notes (Signed)
Subjective:     Patient ID: Sherry Anderson, female   DOB: 09-Mar-1961, 51 y.o.   MRN: [id number]  HPI 51 yo F presents for elevated blood pressure x 3 days. She took her BP at home: SBP range 168-186 and DBP ~ 100. She denies symptoms currently but admits to chest pressure of dyspnea on exertion. She denies HA, vision changes, cough  and LE edema. She is compliant with her antihypertensives and a low salt diet. She does not exercise regularly. She admits to increases social stress/presurres regarding caring for her elderly parents and finding a new job as her current will soon be relocating 1.5 hrs away to [place], [facility].   Review of Systems As per HPI    Objective:   Physical Exam BP 175/98 BP 144/90  Pulse 60  Ht 5' 3.25" (1.607 m)  Wt 158 lb 6.4 oz (71.85 kg)  BMI 27.84 kg/m2 General appearance: alert, cooperative and no distress Eyes: conjunctivae/corneas clear. PERRL, EOM's intact.  Neck: no adenopathy, no carotid bruit, no JVD, supple, symmetrical, trachea midline and thyroid not enlarged, symmetric, no tenderness/mass/nodules Lungs: clear to auscultation bilaterally Heart: regular rate and rhythm, S1, S2 normal, no murmur, click, rub or gallop Extremities: extremities normal, atraumatic, no cyanosis or edema  EKG: no previous EKG, sinus bradycardia.     Assessment and Plan:

## 2012-04-10 ENCOUNTER — Ambulatory Visit: Payer: Managed Care, Other (non HMO)

## 2012-04-14 ENCOUNTER — Encounter: Payer: Self-pay | Admitting: *Deleted

## 2012-04-14 ENCOUNTER — Encounter: Payer: Self-pay | Admitting: Family Medicine

## 2012-04-14 NOTE — Telephone Encounter (Signed)
This encounter was created in error - please disregard.

## 2012-04-14 NOTE — Telephone Encounter (Signed)
error 

## 2012-04-20 ENCOUNTER — Ambulatory Visit: Payer: Managed Care, Other (non HMO) | Admitting: Cardiology

## 2012-09-26 ENCOUNTER — Telehealth: Payer: Self-pay | Admitting: Family Medicine

## 2012-09-26 ENCOUNTER — Other Ambulatory Visit: Payer: Self-pay | Admitting: *Deleted

## 2012-09-26 DIAGNOSIS — I10 Essential (primary) hypertension: Secondary | ICD-10-CM

## 2012-09-26 MED ORDER — LISINOPRIL 20 MG PO TABS: 20.0000 mg | ORAL_TABLET | Freq: Every day | ORAL | Status: DC

## 2012-09-26 NOTE — Telephone Encounter (Signed)
Pt does not have any lisinopril and pharmacy has no record (pt was getting it from mail order) of it and it needs to be called to [address]. Pt has appointment on Friday but needs this today. Please Advise.  JS, SMA

## 2012-09-26 NOTE — Telephone Encounter (Signed)
Called in Lisinopril and called pt and informed. Lorenda Hatchet, Renato Battles

## 2012-09-29 ENCOUNTER — Ambulatory Visit: Payer: Managed Care, Other (non HMO) | Admitting: Family Medicine

## 2012-10-24 ENCOUNTER — Ambulatory Visit (INDEPENDENT_AMBULATORY_CARE_PROVIDER_SITE_OTHER): Payer: Commercial Managed Care - PPO | Admitting: Family Medicine

## 2012-10-24 ENCOUNTER — Other Ambulatory Visit (HOSPITAL_COMMUNITY)
Admission: RE | Admit: 2012-10-24 | Discharge: 2012-10-24 | Disposition: A | Discharge status: Home / Self Care | Payer: Commercial Managed Care - PPO | Source: Ambulatory Visit | Attending: Family Medicine | Admitting: Family Medicine

## 2012-10-24 VITALS — BP 124/80 | HR 71 | Wt 149.5 lb

## 2012-10-24 DIAGNOSIS — Z124 Encounter for screening for malignant neoplasm of cervix: Secondary | ICD-10-CM

## 2012-10-24 DIAGNOSIS — Z01419 Encounter for gynecological examination (general) (routine) without abnormal findings: Secondary | ICD-10-CM | POA: Insufficient documentation

## 2012-10-24 DIAGNOSIS — Z1151 Encounter for screening for human papillomavirus (HPV): Secondary | ICD-10-CM | POA: Insufficient documentation

## 2012-10-24 DIAGNOSIS — Z23 Encounter for immunization: Secondary | ICD-10-CM

## 2012-10-24 DIAGNOSIS — E785 Hyperlipidemia, unspecified: Secondary | ICD-10-CM

## 2012-10-24 DIAGNOSIS — F329 Major depressive disorder, single episode, unspecified: Secondary | ICD-10-CM

## 2012-10-24 DIAGNOSIS — R87810 Cervical high risk human papillomavirus (HPV) DNA test positive: Secondary | ICD-10-CM | POA: Insufficient documentation

## 2012-10-24 DIAGNOSIS — D649 Anemia, unspecified: Secondary | ICD-10-CM

## 2012-10-24 DIAGNOSIS — K2960 Other gastritis without bleeding: Secondary | ICD-10-CM

## 2012-10-24 NOTE — Patient Instructions (Addendum)
Return Friday fasting for blood tests  I'll call you with the results or send by computer  Please return to see Dr Sheffield Slider in 3months.

## 2012-10-25 ENCOUNTER — Encounter: Payer: Self-pay | Admitting: Family Medicine

## 2012-10-25 NOTE — Assessment & Plan Note (Signed)
Still occurs at times despite Omeprazole

## 2012-10-25 NOTE — Assessment & Plan Note (Signed)
Continues her low dose Venlafaxine. Consider higher dose

## 2012-10-25 NOTE — Assessment & Plan Note (Signed)
No current menses or signs of blood in bowel movements. Will repeat CBC with ferritin

## 2012-10-25 NOTE — Progress Notes (Signed)
  Subjective:    Patient ID: Sherry Anderson, female    DOB: 03-Jun-1961, 52 y.o.   MRN: [id number]  HPI Derpression - She continues to be stressed by worries about caring for her parents who are never satisfied, and about her sons stationed in [place] and [place].   Menopausal - no menses for years. Agrees to pap smear.   Anemia - No signs of blood in her bowel movements. Review of Systems     Objective:   Physical Exam  Neck: No thyromegaly present.  Cardiovascular: Normal rate and regular rhythm.   No murmur heard. Pulmonary/Chest: Effort normal and breath sounds normal. She has no rales.  Abdominal: Soft. She exhibits no distension and no mass. There is no tenderness. There is no rebound and no guarding.  Genitourinary: Vagina normal and uterus normal. Guaiac negative stool. No vaginal discharge found.  Lymphadenopathy:    She has no cervical adenopathy.  Psychiatric: Her behavior is normal. Judgment and thought content normal.  Dysphoric affect as usual when stresses are brought up          Assessment & Plan:

## 2012-10-30 ENCOUNTER — Other Ambulatory Visit: Payer: Commercial Managed Care - PPO

## 2012-10-30 ENCOUNTER — Other Ambulatory Visit: Payer: Self-pay | Admitting: Family Medicine

## 2012-10-30 DIAGNOSIS — F329 Major depressive disorder, single episode, unspecified: Secondary | ICD-10-CM

## 2012-10-30 DIAGNOSIS — D649 Anemia, unspecified: Secondary | ICD-10-CM

## 2012-10-30 DIAGNOSIS — E785 Hyperlipidemia, unspecified: Secondary | ICD-10-CM

## 2012-10-30 LAB — CBC
HCT: 35.5 % — ABNORMAL LOW (ref 36.0–46.0)
Hemoglobin: 12.4 g/dL (ref 12.0–15.0)
RDW: 13.7 % (ref 11.5–15.5)
WBC: 4.4 10*3/uL (ref 4.0–10.5)

## 2012-10-30 LAB — LIPID PANEL
HDL: 60 mg/dL (ref >39)
LDL Cholesterol: 146 mg/dL — ABNORMAL HIGH (ref 0–99)

## 2012-10-30 LAB — IRON AND TIBC
TIBC: 380 ug/dL (ref 250–470)
UIBC: 299 ug/dL (ref 125–400)

## 2012-10-30 NOTE — Progress Notes (Signed)
FLP,CBC.TSH,FERRIRIN AND TIBC DONE TODAY Sherry Anderson

## 2012-11-01 ENCOUNTER — Other Ambulatory Visit: Payer: Self-pay | Admitting: *Deleted

## 2012-11-01 DIAGNOSIS — I10 Essential (primary) hypertension: Secondary | ICD-10-CM

## 2012-11-02 LAB — COMPREHENSIVE METABOLIC PANEL
ALT: 16 U/L (ref 0–35)
CO2: 28 mEq/L (ref 19–32)
Calcium: 10 mg/dL (ref 8.4–10.5)
Chloride: 102 mEq/L (ref 96–112)
Creat: 0.76 mg/dL (ref 0.50–1.10)
Glucose, Bld: 99 mg/dL (ref 70–99)
Total Bilirubin: 0.2 mg/dL — ABNORMAL LOW (ref 0.3–1.2)
Total Protein: 7.5 g/dL (ref 6.0–8.3)

## 2012-11-06 MED ORDER — LISINOPRIL 20 MG PO TABS: 20.0000 mg | ORAL_TABLET | Freq: Every day | ORAL | Status: DC

## 2012-11-06 MED ORDER — OMEPRAZOLE 20 MG PO CPDR: 20.0000 mg | DELAYED_RELEASE_CAPSULE | Freq: Two times a day (BID) | ORAL | Status: DC

## 2012-11-06 MED ORDER — TRIAMTERENE-HCTZ 37.5-25 MG PO CAPS: 1.0000 | ORAL_CAPSULE | Freq: Every day | ORAL | Status: DC

## 2012-11-06 MED ORDER — VENLAFAXINE HCL ER 150 MG PO CP24: 150.0000 mg | ORAL_CAPSULE | Freq: Every day | ORAL | Status: DC

## 2012-11-08 ENCOUNTER — Encounter: Payer: Self-pay | Admitting: Family Medicine

## 2012-12-13 ENCOUNTER — Encounter: Payer: Self-pay | Admitting: Family Medicine

## 2013-03-02 ENCOUNTER — Encounter: Payer: Self-pay | Admitting: Family Medicine

## 2013-03-02 ENCOUNTER — Ambulatory Visit (INDEPENDENT_AMBULATORY_CARE_PROVIDER_SITE_OTHER): Payer: Commercial Managed Care - PPO | Admitting: Family Medicine

## 2013-03-02 VITALS — BP 120/79 | HR 71 | Temp 98.2°F | Ht 63.25 in | Wt 150.2 lb

## 2013-03-02 DIAGNOSIS — M25532 Pain in left wrist: Secondary | ICD-10-CM

## 2013-03-02 DIAGNOSIS — R5383 Other fatigue: Secondary | ICD-10-CM

## 2013-03-02 DIAGNOSIS — M25539 Pain in unspecified wrist: Secondary | ICD-10-CM

## 2013-03-02 DIAGNOSIS — R5381 Other malaise: Secondary | ICD-10-CM

## 2013-03-02 LAB — COMPREHENSIVE METABOLIC PANEL
AST: 24 U/L (ref 0–37)
Albumin: 4.4 g/dL (ref 3.5–5.2)
Alkaline Phosphatase: 57 U/L (ref 39–117)
Calcium: 9.8 mg/dL (ref 8.4–10.5)
Chloride: 102 mEq/L (ref 96–112)
Potassium: 3.9 mEq/L (ref 3.5–5.3)
Sodium: 138 mEq/L (ref 135–145)
Total Protein: 7.8 g/dL (ref 6.0–8.3)

## 2013-03-02 LAB — CBC
HCT: 40.2 % (ref 36.0–46.0)
Hemoglobin: 13.9 g/dL (ref 12.0–15.0)
MCHC: 34.6 g/dL (ref 30.0–36.0)
RDW: 13.4 % (ref 11.5–15.5)
WBC: 5.6 10*3/uL (ref 4.0–10.5)

## 2013-03-02 LAB — TSH: TSH: 3.164 u[IU]/mL (ref 0.350–4.500)

## 2013-03-02 NOTE — Progress Notes (Signed)
  Subjective:    Patient ID: Sherry Anderson, female    DOB: 1961/07/09, 52 y.o.   MRN: [id number]  HPI  Visit conducted in Spanish.  Patient presents with concern about chronic feeling of tiredness and fatigue.  She has felt without energy for several months; cannot associate with a particular event.  Maintains good sleep hygiene, goes to bed at 9pm and awakens at 430am to prepare for work.  She gets up 1-2x/night to use the bathroom.  She does not drink excessive caffeine before bed.  She does not smoke or drink (was never a smoker).    Also complaint of pain along the ulnar aspect of her left wrist, on the bony prominence/styloid process of ulna.  No trauma, unclear what triggered. No radiation of pain. No weakness of L wrist or hand.   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  She started the job last Nov.  She previously worked in a similar job that paid her $13/hr, but that company moved to [place] and the commute was too far for her.  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  She cares for her parents, in their 80s/90s.  She expresses frustration that she uses most of her days off to take them to the doctors office.   PMHx; Surgical menopause 12 years ago (hysterectomy/oophorectomy).  She was diagnosed with breast cancer 12 yrs ago and had surgery (she describes what sounds like lumpectomy), radiation therapy and chemotherapy at age 2.  Has annual mammography (screening) with last one being Feb 2013 (normal).   Review of Systems  Is on Effexor for diagnosis of depression; feels that her depression is reasonably well controlled. If she misses a dose of Effexor she feels very ill (nausea, generalized malaise). Denies chest pain or palpitations. Does feel sweats from time to time.      Objective:   Physical Exam Well appearing, no apparent distress HEENT Neck supple. Thyroid supple without nodules or tenderness.   COR Regular S1S2, no extra sounds PULM Clear bilaterally, no rales or wheezes        Assessment & Plan:

## 2013-03-02 NOTE — Assessment & Plan Note (Signed)
Pain over styloid process, no radiation of pain.  No pain to palpate ulnar groove.  Some minor prominence of L ulnar styloid when compared with R side. For x-ray, to consider arthritic cause.  No findings to suggest peripheral neuropathy.

## 2013-03-02 NOTE — Assessment & Plan Note (Signed)
Patient presents with worsening fatigue; she is dealing with the care of her elderly parents and trying to get by with a reduction in her income (hourly wage decrease from $13/hr to $10/hr).  No other family in immediate area. Prior diagnosis of depression.  Plan to check thyroid function, CBC and metabolic profile.  Consider change from Effexor to SSRI; PHQ9 at next visit (in [place]).  Her home number for calls is [phone].

## 2013-03-07 ENCOUNTER — Telehealth: Payer: Self-pay | Admitting: Family Medicine

## 2013-03-07 NOTE — Telephone Encounter (Signed)
I called and left message in Spanish regarding lab results. JB

## 2013-03-22 ENCOUNTER — Telehealth: Payer: Self-pay | Admitting: Family Medicine

## 2013-03-22 NOTE — Telephone Encounter (Signed)
Called patient to report her recent labs; TSH/CBC/CMet all WNL with exception of fasting glucose 103.  She says she continues with tiredness during the day. Recent event where her son had trouble waking her while she was asleep on sofa, said she snored very loudly and even had stopped breathing for a moment.  Will consider Epworth Sleepiness Scale and possibly polysomnography as next step in workup.  She continues on Effexor which she says has been helpful for her mood. JB

## 2013-03-23 ENCOUNTER — Ambulatory Visit: Payer: Commercial Managed Care - PPO | Admitting: Family Medicine

## 2013-03-30 ENCOUNTER — Ambulatory Visit: Payer: Commercial Managed Care - PPO | Admitting: Family Medicine

## 2013-04-20 ENCOUNTER — Other Ambulatory Visit: Payer: Self-pay | Admitting: Family Medicine

## 2013-07-04 ENCOUNTER — Other Ambulatory Visit: Payer: Self-pay | Admitting: Family Medicine

## 2013-08-13 ENCOUNTER — Telehealth: Payer: Self-pay | Admitting: Family Medicine

## 2013-08-13 MED ORDER — VENLAFAXINE HCL ER 150 MG PO CP24: 150.0000 mg | ORAL_CAPSULE | Freq: Every day | ORAL | Status: DC

## 2013-08-13 MED ORDER — OMEPRAZOLE 20 MG PO CPDR: 20.0000 mg | DELAYED_RELEASE_CAPSULE | Freq: Two times a day (BID) | ORAL | Status: DC

## 2013-08-13 NOTE — Telephone Encounter (Signed)
Will fwd to MD.  Katrisha Segall L, CMA  

## 2013-08-13 NOTE — Telephone Encounter (Signed)
Refills sent to [facility] 

## 2013-08-13 NOTE — Telephone Encounter (Signed)
Patient request refill on Effexor XR and Prilosec. Patient usually get meds by mail but is almost completely out. Needs refills sent to [facility] on [address]

## 2013-08-14 NOTE — Telephone Encounter (Signed)
Attempted to notify patient but her mailbox is full.  Tran Arzuaga, Darlyne Russian, CMA

## 2013-10-15 ENCOUNTER — Other Ambulatory Visit: Payer: Self-pay | Admitting: Family Medicine

## 2013-10-23 ENCOUNTER — Ambulatory Visit: Payer: Commercial Managed Care - PPO | Admitting: Family Medicine

## 2013-11-05 ENCOUNTER — Other Ambulatory Visit: Payer: Self-pay

## 2013-11-05 DIAGNOSIS — Z1231 Encounter for screening mammogram for malignant neoplasm of breast: Secondary | ICD-10-CM

## 2013-11-05 DIAGNOSIS — Z9889 Other specified postprocedural states: Secondary | ICD-10-CM

## 2013-11-06 ENCOUNTER — Ambulatory Visit (INDEPENDENT_AMBULATORY_CARE_PROVIDER_SITE_OTHER): Payer: Commercial Managed Care - PPO | Admitting: Family Medicine

## 2013-11-06 ENCOUNTER — Encounter: Payer: Self-pay | Admitting: Family Medicine

## 2013-11-06 ENCOUNTER — Ambulatory Visit
Admission: RE | Admit: 2013-11-06 | Discharge: 2013-11-06 | Disposition: A | Discharge status: Home / Self Care | Payer: Commercial Managed Care - PPO | Source: Ambulatory Visit | Attending: Family Medicine | Admitting: Family Medicine

## 2013-11-06 VITALS — BP 129/81 | HR 65 | Temp 97.7°F | Ht 63.25 in | Wt 160.0 lb

## 2013-11-06 DIAGNOSIS — M25562 Pain in left knee: Principal | ICD-10-CM

## 2013-11-06 DIAGNOSIS — I10 Essential (primary) hypertension: Secondary | ICD-10-CM

## 2013-11-06 DIAGNOSIS — M25561 Pain in right knee: Secondary | ICD-10-CM | POA: Insufficient documentation

## 2013-11-06 DIAGNOSIS — M25569 Pain in unspecified knee: Secondary | ICD-10-CM

## 2013-11-06 DIAGNOSIS — B351 Tinea unguium: Secondary | ICD-10-CM | POA: Insufficient documentation

## 2013-11-06 LAB — COMPREHENSIVE METABOLIC PANEL
ALT: 19 U/L (ref 0–35)
AST: 16 U/L (ref 0–37)
Albumin: 4.4 g/dL (ref 3.5–5.2)
Alkaline Phosphatase: 51 U/L (ref 39–117)
BILIRUBIN TOTAL: 0.4 mg/dL (ref 0.2–1.2)
BUN: 18 mg/dL (ref 6–23)
CALCIUM: 9.7 mg/dL (ref 8.4–10.5)
CHLORIDE: 100 meq/L (ref 96–112)
CO2: 28 meq/L (ref 19–32)
CREATININE: 0.69 mg/dL (ref 0.50–1.10)
GLUCOSE: 97 mg/dL (ref 70–99)
Potassium: 3.9 mEq/L (ref 3.5–5.3)
Sodium: 137 mEq/L (ref 135–145)
Total Protein: 7.5 g/dL (ref 6.0–8.3)

## 2013-11-06 MED ORDER — TERBINAFINE HCL 250 MG PO TABS: 250.0000 mg | ORAL_TABLET | Freq: Every day | ORAL | Status: DC

## 2013-11-06 NOTE — Progress Notes (Signed)
Subjective:     Patient ID: Sherry Anderson, female   DOB: October 17, 1960, 53 y.o.   MRN: [id number] Attending Note; Patient seen and examined by me in Spanish and [place]; in conjunction with Charmian Muff, MS3 [facility], and I agree with his assessment and plan with additions in bold type.   Sherry Anderson presents with complaint of worsening pain along LEFT knee and also involving LEFT shoulder and hip, worsening over the past year.  She has had some pain starting in the right knee as well. Tylenol taken one time daily at the end of the day helps the pain.  She works in [employer], Therapist, nutritional.  Is right-hand dominant.  Pain worse at the end of work day.  No incident of trauma that she can recall.  Is concerned about the yellowing of her toenails, would like this treated. JB  HPI:  Sherry Anderson is here for evaluation of chronic joint pain.  She has polyarticular joint pain on the L side of her body--ankle, knee, hip, and shoulder--for the last year.  It has gotten progressively worse over the past year.  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  It is much worse at the end of the day, and due to the pain, she is unable to sleep on her L side.  There is no inciting traumatic event that she remembers.  Her L knee is probably causing her the most pain.  Over the past 1-2 weeks, a similar pain has developed in her R knee.  Same characteristics as the L knee.  Her pain is currently relieved by Tylenol 500 mg, for which the patient takes 1-2 pills at the end of the day.  Also want medication for toenail fungus on her R foot.   HTN:  Well maintained on Lisinopril, Triamterene-HCTZ.  BO was 129/81 today (repeated manual was 122/80).   Tolerating the medications well, and does not notice any side effects.  Non-smoker, never smoked.  Does no drink alcohol.  Does not exercise outside of work.  Depression/Fatigue:  Notes that she is chronically depressed, currently managed on  Venlafaxine.  Her mood has been "down" for the last few years.  She wonders if the medication is working.  She has endorsed a chronic sense of tiredness all day, and the desire to sleep more and more.  No problems with sleep or early morning awakenings.  No change in appetite.  Has not gotten better or worse over the past few months.  Denies anhedonia, SI/HI.  She does feel a little anxious and irritable from time to time.    Review of Systems Denies swelling, erythema of the joint.  Says the joints are not hot to touch.  Denies low back pain, tingling down her leg.      Objective:   Physical Exam  Constitutional: She appears well-developed and well-nourished.  Neck: Normal range of motion. Neck supple.  Cardiovascular: Normal rate, regular rhythm, normal heart sounds and intact distal pulses.   Pulmonary/Chest: Effort normal and breath sounds normal.  Skin: Skin is warm.   MSK:    L and R knee:  No erythema, no warmness, no swelling.  Tenderness to palpation at the patellar tendon and the medial/lateral joint space bilaterally.  Negative anterior drawer test.  No pain or limited ROM on knee functions.  No pain or limitation on varus and valgus testing.  L hip:  No erythema, no warmness, no swelling.  No pain or limitation in  ROM on adduction, abduction, flexion, extension, internal or external rotation.  L shoulder:  Good range of motion and no pain on the lift off test, arm abduction.  EXAM: Well appearing, no apparent distress.  Able to get up to exam table easily without assistance, gait unremarkable in/out of the room.  HEENT Neck supple, Full active ROM neck in all planes.   MSK: Shoulders: full active ROM, including full internal rotation bilaterally.  Mild tenderness along posterior aspect of left shoulder.  Flexion/extension of both elbows/wrists, as well as bilateral handgrip all full and symmetric.  Sensation in distal fingertips is full and symmetric, with brisk cap refill.  No  active synovitis in hands, no ulnar deviation of digits.  KNEES: no effusion, erythema or warmth to the joints.  Modest joint line tenderness that is worse on the LEFT knee.  Negative drawer sign bilaterally. Dorsi-plantarflexion full and symmetric.  No hyperreflexia, no clonus appreciated. TOENAILS: with erosion/gnawed appearance, and yellowed discoloration of both great toenails. JB    Assessment:     From a health maintenance standpoint, Sherry Anderson is doing well.  HTN is at goal and management should continue as is.  Continue to reinforce the importance of aeorbic exercise as a lifestyle modification.  Given her BMI of 26, and her history of hypertension, she should be screened for T2DM.  She drank a cup of juice at 6 am (blood work to be done around 10 am), so if sugars are above 100, consider a fasting glucose.  Does not need a fasting lipid panel at this time.  Last LDL 146 and she has a cumulative 10-year risk of CAD of 2.2%.  From an acute standpoint, this is likely OA.  The pain is worse with activity and relived by rest.  The primary weight bearing joints are the most painful.  Diagnosis should be confirmed radiographically.  Since the patient is tolerating Tylenol well, should continue with that (adjusting the timing of the dose), and hold Mobic as a backup.  Tylenol SE profile preferred.     Toe fungus should be treated with anti-fungal medication for 3 months, but full results are expected for upto 1 year.     Plan:     1.  Bilateral standing AP of the knee to evaluate for OA. 2.  OA pain management:  Tylenol, 500 mg.  Take 2 pills before work, and 2 pills when get home from work. 3. CMP:  Check blood sugar for potential sings of T2DM. 4. LFTs:  Monitor for liver disease prior to starting Terbafine 5. Terbinafine:  250 mg qd for 12 weeks.  6. F/u in 1 month with parents from [place] to assess OA and chronic fatigue/depression

## 2013-11-06 NOTE — Patient Instructions (Signed)
Fue un Civil [employer] fast streamer.  Creo que el dolor que tiene se debe a "osteoporosis". Tylenol 2 tabletas 2 veces por dia.  Placas de las rodillas.  Quiero verle de nuevo en un mes.   Medicina para las unas (terbinafine 250mg  una vez por dia, por 12 semanas).  Demora 12 meses para la una curarse completamente.   FOLLOWUP IN ONE MONTH WITH DR Lindell Noe.

## 2013-11-07 ENCOUNTER — Encounter: Payer: Self-pay | Admitting: Family Medicine

## 2013-11-07 NOTE — Assessment & Plan Note (Signed)
Clinical appearance of onychomycosis.  To check transaminases today, will treat with oral terbinafine 250mg  daily for 12 weeks.  Discussed expectations for visible improvement (12 months for toenails to regenerate completely), and high likelihood of recurrence. JB

## 2013-11-07 NOTE — Assessment & Plan Note (Signed)
Clinical presentation that is most consistent with OA; will schedule Tylenol for now, consider meloxicam if Tylenol no longer working. Standing Xrays ordered today, although clinical presentation is fairly convincing. JB

## 2013-11-14 ENCOUNTER — Ambulatory Visit: Payer: Commercial Managed Care - PPO

## 2013-11-14 ENCOUNTER — Ambulatory Visit
Admission: RE | Admit: 2013-11-14 | Discharge: 2013-11-14 | Disposition: A | Discharge status: Home / Self Care | Payer: Commercial Managed Care - PPO | Source: Ambulatory Visit

## 2013-11-14 DIAGNOSIS — Z9889 Other specified postprocedural states: Secondary | ICD-10-CM

## 2013-11-14 DIAGNOSIS — Z1231 Encounter for screening mammogram for malignant neoplasm of breast: Secondary | ICD-10-CM

## 2013-11-23 ENCOUNTER — Other Ambulatory Visit: Payer: Self-pay | Admitting: Family Medicine

## 2013-11-23 NOTE — Telephone Encounter (Signed)
Note to nursing staff - please call patient to make an appointment, has not been seen for hypertension in over a year, will need to be seen in office prior to refills being provided, she has given a 6 month supply in November (90 tablets plus 1 refill and therefore should not be out of meds at this time). Thanks

## 2013-12-04 ENCOUNTER — Encounter: Payer: Self-pay | Admitting: Family Medicine

## 2013-12-04 ENCOUNTER — Ambulatory Visit (INDEPENDENT_AMBULATORY_CARE_PROVIDER_SITE_OTHER): Payer: Commercial Managed Care - PPO | Admitting: Family Medicine

## 2013-12-04 VITALS — BP 122/85 | HR 69 | Ht 63.25 in | Wt 153.6 lb

## 2013-12-04 DIAGNOSIS — F329 Major depressive disorder, single episode, unspecified: Secondary | ICD-10-CM

## 2013-12-04 DIAGNOSIS — I10 Essential (primary) hypertension: Secondary | ICD-10-CM

## 2013-12-04 DIAGNOSIS — F3289 Other specified depressive episodes: Secondary | ICD-10-CM

## 2013-12-04 MED ORDER — VENLAFAXINE HCL ER 75 MG PO CP24: ORAL_CAPSULE | ORAL | Status: DC

## 2013-12-04 NOTE — Patient Instructions (Addendum)
Me alegro que las rodillas ya estan mejor.  Para la presion, continue con las medicinas lisinopril y triampterene/HCTZ.   Para la ansiedad, estamos agregando otra tableta de venlafaxine ER 75mg  a la de 150mg  que ya esta' tomando, para una dosis diaria de 225mg  por dia.   Quiero volver a verle en 4 a 6 semanas a ver como esta' de la ansiedad.  FOLLOW UP WITH DR Lindell Noe IN 4 TO 6 WEEKS FOR MEDICATION FOLLOW UP.

## 2013-12-06 NOTE — Assessment & Plan Note (Addendum)
Well controlled; no changes in medications for hypertension.  The 10-year CV risk calculator puts her at a 2% 10-yr risk.

## 2013-12-06 NOTE — Progress Notes (Signed)
   Subjective:    Patient ID: Sherry Anderson, female    DOB: June 07, 1961, 53 y.o.   MRN: [id number]  HPI Visit conducted in [place]. Patient reports improvement in her bilateral knee pain with use of Tylenol.  She had xrays looking for DJD, which were relatively unremarkable. Reviewed the images with her today in the office.   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 Has some soreness in her lower back at the end of the work day.  Not taking NSAIDs for pain.   Reports feeling tired during the day; sleeps well at night and does not have trouble with sleep initiation. Has had increase in her anxiety levels recently.  Is taking venlafaxine presently.    Review of Systems Denies shortness of breath, no chest pain.     Objective:   Physical Exam  Well appearing, no apparent distress.  HEENT Neck supple, no cervical adenopathy.  PULM Clear bilaterally.       Assessment & Plan:

## 2013-12-06 NOTE — Assessment & Plan Note (Signed)
Tiredness and increased anxiety that may be attributed to diagnosis of depression.  Plan to raise dose of venlafaxine to max dose of 225mg /day, recheck in 4 weeks.  Follow blood pressure.

## 2013-12-25 ENCOUNTER — Ambulatory Visit: Payer: Commercial Managed Care - PPO | Admitting: Family Medicine

## 2014-01-30 ENCOUNTER — Other Ambulatory Visit: Payer: Self-pay | Admitting: *Deleted

## 2014-01-30 MED ORDER — TRIAMTERENE-HCTZ 37.5-25 MG PO CAPS: ORAL_CAPSULE | ORAL | Status: DC

## 2014-01-30 MED ORDER — VENLAFAXINE HCL ER 150 MG PO CP24: 150.0000 mg | ORAL_CAPSULE | Freq: Every day | ORAL | Status: DC

## 2014-01-30 MED ORDER — LISINOPRIL 20 MG PO TABS: ORAL_TABLET | ORAL | Status: DC

## 2014-05-20 ENCOUNTER — Encounter: Payer: Self-pay | Admitting: Family Medicine

## 2014-05-20 ENCOUNTER — Ambulatory Visit (INDEPENDENT_AMBULATORY_CARE_PROVIDER_SITE_OTHER): Payer: Commercial Managed Care - PPO | Admitting: Family Medicine

## 2014-05-20 VITALS — BP 140/82 | HR 73 | Temp 98.4°F | Ht 63.25 in | Wt 158.0 lb

## 2014-05-20 DIAGNOSIS — J069 Acute upper respiratory infection, unspecified: Secondary | ICD-10-CM | POA: Insufficient documentation

## 2014-05-20 MED ORDER — VENLAFAXINE HCL ER 150 MG PO CP24: 150.0000 mg | ORAL_CAPSULE | Freq: Every day | ORAL | Status: DC

## 2014-05-20 MED ORDER — LISINOPRIL 20 MG PO TABS: ORAL_TABLET | ORAL | Status: DC

## 2014-05-20 MED ORDER — PHENOL 1.4 % MT LIQD: 2.0000 | OROMUCOSAL | Status: DC | PRN

## 2014-05-20 MED ORDER — OMEPRAZOLE 20 MG PO CPDR: 20.0000 mg | DELAYED_RELEASE_CAPSULE | Freq: Two times a day (BID) | ORAL | Status: DC

## 2014-05-20 MED ORDER — TRIAMTERENE-HCTZ 37.5-25 MG PO CAPS: ORAL_CAPSULE | ORAL | Status: DC

## 2014-05-20 NOTE — Progress Notes (Signed)
   Subjective:    Patient ID: Sherry Anderson, female    DOB: Jan 09, 1961, 53 y.o.   MRN: [id number]  HPI 53 year old female presents for a same day appointment with complaints of sore throat.  1) Sore Throat - Patient reports that this has been going on for 1 day - She reports associated headache, chills, and earache (R). - She denies any cough, SOB, chest pain, fever, nausea, vomiting. - Patient has known sick contact as her husband has been sick w/ sore throat recently - No exacerbating or relieving factors.   Review of Systems Per HPI    Objective:   Physical Exam Filed Vitals:   05/20/14 1048  BP: 140/82  Pulse: 73  Temp: 98.4 F (36.9 C)   Exam: General: well appearing female in NAD.  HEENT: NCAT. Normal TM's bilaterally. Oropharynx clear.  Cardiovascular: RRR. No murmurs, rubs, or gallops. Respiratory: CTAB. No rales, rhonchi, or wheeze     Assessment & Plan:  See Problem List

## 2014-05-20 NOTE — Assessment & Plan Note (Signed)
Exam unremarkable. Given sick contact, this is likely viral. Advised supportive care.

## 2014-05-20 NOTE — Patient Instructions (Addendum)
You have a viral URI.  Use Tylenol and/or Ibuprofen as needed for sore throat/headache/ear discomfort. You can also use the throat spray as needed. Try and get plenty of rest and drink plenty of fluids. Please follow up with your PCP if you fail to improve or worsen.   Upper Respiratory Infection, Adult An upper respiratory infection (URI) is also known as the common cold. It is often caused by a type of germ (virus). Colds are easily spread (contagious). You can pass it to others by kissing, coughing, sneezing, or drinking out of the same glass. Usually, you get better in 1 or 2 weeks.  HOME CARE   Only take medicine as told by your doctor.  Use a warm mist humidifier or breathe in steam from a hot shower.  Drink enough water and fluids to keep your pee (urine) clear or pale yellow.  Get plenty of rest.  Return to work when your temperature is back to normal or as told by your doctor. You may use a face mask and wash your hands to stop your cold from spreading. GET HELP RIGHT AWAY IF:   After the first few days, you feel you are getting worse.  You have questions about your medicine.  You have chills, shortness of breath, or brown or red spit (mucus).  You have yellow or brown snot (nasal discharge) or pain in the face, especially when you bend forward.  You have a fever, puffy (swollen) neck, pain when you swallow, or white spots in the back of your throat.  You have a bad headache, ear pain, sinus pain, or chest pain.  You have a high-pitched whistling sound when you breathe in and out (wheezing).  You have a lasting cough or cough up blood.  You have sore muscles or a stiff neck. MAKE SURE YOU:   Understand these instructions.  Will watch your condition.  Will get help right away if you are not doing well or get worse. Document Released: 01/19/2008 Document Revised: 10/25/2011 Document Reviewed: 11/07/2013 [facility] Patient Information 2015 [place], [place]. This  information is not intended to replace advice given to you by your health care provider. Make sure you discuss any questions you have with your health care provider.

## 2014-06-07 ENCOUNTER — Encounter: Payer: Commercial Managed Care - PPO | Admitting: Family Medicine

## 2014-06-13 ENCOUNTER — Other Ambulatory Visit: Payer: Self-pay | Admitting: *Deleted

## 2014-06-17 ENCOUNTER — Other Ambulatory Visit: Payer: Self-pay | Admitting: *Deleted

## 2014-06-17 NOTE — Telephone Encounter (Signed)
Note to nursing staff - please call patient and confirm that she needs refills of omeprazole, Venlafaxine 75 mg, Venlafaxine 150 mg, Lisinopril, and Dyazide, according to our records the only one that she should need is omeprazole, the rest should have refills and/or were just refills a few weeks ago by Dr. Lacinda Axon

## 2014-06-18 ENCOUNTER — Ambulatory Visit (INDEPENDENT_AMBULATORY_CARE_PROVIDER_SITE_OTHER): Payer: Commercial Managed Care - PPO | Admitting: *Deleted

## 2014-06-18 ENCOUNTER — Encounter: Payer: Self-pay | Admitting: Family Medicine

## 2014-06-18 ENCOUNTER — Ambulatory Visit (INDEPENDENT_AMBULATORY_CARE_PROVIDER_SITE_OTHER): Payer: Commercial Managed Care - PPO | Admitting: Family Medicine

## 2014-06-18 VITALS — BP 118/84 | HR 77 | Temp 98.1°F | Ht 63.0 in | Wt 153.0 lb

## 2014-06-18 DIAGNOSIS — R5383 Other fatigue: Secondary | ICD-10-CM

## 2014-06-18 DIAGNOSIS — F329 Major depressive disorder, single episode, unspecified: Secondary | ICD-10-CM

## 2014-06-18 DIAGNOSIS — K2970 Gastritis, unspecified, without bleeding: Secondary | ICD-10-CM

## 2014-06-18 DIAGNOSIS — K2990 Gastroduodenitis, unspecified, without bleeding: Secondary | ICD-10-CM

## 2014-06-18 DIAGNOSIS — F32A Depression, unspecified: Secondary | ICD-10-CM

## 2014-06-18 DIAGNOSIS — D649 Anemia, unspecified: Secondary | ICD-10-CM

## 2014-06-18 DIAGNOSIS — Z23 Encounter for immunization: Secondary | ICD-10-CM

## 2014-06-18 LAB — CBC
HCT: 38.5 % (ref 36.0–46.0)
Hemoglobin: 13.5 g/dL (ref 12.0–15.0)
MCH: 29.8 pg (ref 26.0–34.0)
MCHC: 35.1 g/dL (ref 30.0–36.0)
MCV: 85 fL (ref 78.0–100.0)
Platelets: 319 10*3/uL (ref 150–400)
RBC: 4.53 MIL/uL (ref 3.87–5.11)
RDW: 13.8 % (ref 11.5–15.5)
WBC: 4.5 10*3/uL (ref 4.0–10.5)

## 2014-06-18 LAB — BASIC METABOLIC PANEL
BUN: 12 mg/dL (ref 6–23)
CALCIUM: 9.6 mg/dL (ref 8.4–10.5)
CO2: 22 mEq/L (ref 19–32)
CREATININE: 0.84 mg/dL (ref 0.50–1.10)
Chloride: 103 mEq/L (ref 96–112)
Glucose, Bld: 84 mg/dL (ref 70–99)
Potassium: 3.7 mEq/L (ref 3.5–5.3)
Sodium: 139 mEq/L (ref 135–145)

## 2014-06-18 LAB — TSH: TSH: 4.134 u[IU]/mL (ref 0.350–4.500)

## 2014-06-18 MED ORDER — CITALOPRAM HYDROBROMIDE 20 MG PO TABS: 20.0000 mg | ORAL_TABLET | Freq: Every day | ORAL | Status: DC

## 2014-06-18 NOTE — Patient Instructions (Addendum)
[phone] mobile  Fue un placer verle hoy.   Para el cansancio, estoy chequeando algunos laboratorios y Physicist, medical con los resultados cuando esten listos.   Quiero que suspenda la Effexor, y comience a tomar la nueva medicina CITALOPRAM 20mg  una tableta todas las noches.   Quiero volver a verle en 2 a 4 semanas para seguimiento con Careers adviser.    Para el dolor en la boca del [place], estoy refiriendole a Brewing technologist para evaluarle para la posibilidad de una ulcera; tambien para vigilancia para cancer de colon (colonoscopia).   FOLLOW UP IN 2 TO 4 WEEKS WITH DR Lindell Noe MEDICATION FOLLOW UP

## 2014-06-19 ENCOUNTER — Encounter: Payer: Self-pay | Admitting: Gastroenterology

## 2014-06-19 ENCOUNTER — Telehealth: Payer: Self-pay | Admitting: Family Medicine

## 2014-06-19 NOTE — Telephone Encounter (Signed)
Called patient, call completed in Spanish. I informed her of results of recent labs.   She did not receive a follow-up appointment yesterday, I asked her to call our office to schedule in 2-4 weeks.   She is to have GI referral for suspected ulcer disease, likely will need upper endoscopy.  I told her that she should expect a call to arrange the GI referral.   JB

## 2014-06-19 NOTE — Progress Notes (Signed)
   Subjective:    Patient ID: Sherry Anderson, female    DOB: 1960-10-23, 53 y.o.   MRN: [id number]  HPI Visit in [place]. Patient with several complaints for this visit, marked as CPE.  She reports constantly feeling "tired", unable to get enough sleep despite sleeping from 9pm to 5am (workdays) and 8am (days off).  Sleeps all night, no nighttime awakening. Takes no meds to sleep. Sometimes (once /week) wakes up with HA. Feels hot flashes on continuous basis, more than when she went through menopause 13 years ago. She is a breast cancer survivor, underwent surgery/radiation/chemotherapy (tamoxifen)  in 2002 for cancer in the L breast. Now has regular screening mammography, most recent one this past April was negative.  Has a sharp pain in the epigastric area, worse when fasting and gets better after she eats. Not associated with any particular types of food. No nausea/vomiting. Takes omeprazole, not better with this.   Follow up for depression. Takes Effexor, feels like "I'm going to die" if she does not take it.  At the same time, she continues to feel depressed, especially when thinking about her three grown sons.  Two of them are in [place], one is local but has his own life. Sherry Anderson feels abandoned by her sons.  Is in a long-time dispute with her brother's wife; sister-in-law asked that Sherry Anderson's parents come to live with them (brother and sister-in-law), Sherry Anderson feels that her parents are not being well cared for there. Does not have social supports of her own locally. No SI.  Review of Systems  No fevers/chills, no weight loss, no chest pain, no cough, no shortness of breath. Does not drink alcohol or smoke tobacco (never-smoker). No vaginal bleeding or discharge. No dysuria.   Family Hx; No colon cancer or breast cancer in family.     Objective:   Physical Exam Generally well appearing, emotional lability when talking about conflict with sister-in-law and depression.  HEENT neck  supple, no cervical adenopathy. Thyroid supple and non-nodular/nontender.  COR Regular S1S2, no extra sounds PULM Clear bilaterally, no rales or wheeze.  ABD Soft, nontender, nondistended. Mild tenderness along epigastrium with deep palpation. No CVA tenderness or suprapubic tenderness.  EXTS no edema in lower extremities.        Assessment & Plan:

## 2014-06-19 NOTE — Telephone Encounter (Signed)
Yes her referral was pout in their workqueue. Marte Celani CMA

## 2014-06-19 NOTE — Assessment & Plan Note (Signed)
Suspect fatigue due to depression. To check TSH and CBC, despite low likelihood of being cause of her sxs. PHQ9 at next visit after change in antidepressant treatment.

## 2014-06-19 NOTE — Assessment & Plan Note (Signed)
Not well controlled based on her report of symptoms (especially sleep disorder; anhedonia; emotional lability). No SI.  Effexor does not appear to be contorlling her sxs. Plan to switch to SSRI (has not been on SSRI in the past), citalopram 20mg  daily. Discussed plan for follow up in 2-3 weeks, sooner if needed. Plans to eventually increase dose to 40mg /day.  Will do PHQ9 on intake at next visit.

## 2014-06-19 NOTE — Assessment & Plan Note (Signed)
Suspect PUD, given improvement after eating and worsening when fasting. She is fasting today for this exam. Already failed PPI (omeprazole). Plan for GI consult for likely EGD.  She lacks concerning sxs such as weight loss, emesis, or hematemesis. Also in need of colon cancer screening, discussed at length as well.

## 2014-06-20 ENCOUNTER — Other Ambulatory Visit: Payer: Self-pay | Admitting: Family Medicine

## 2014-06-20 NOTE — Telephone Encounter (Signed)
Attempted to contact patient by both home phone and mobile, unable to reach patient, per [facility] records the only medication that she is due for a refill is omeprazole, will attempt to contact again later today/tomorrow

## 2014-06-21 MED ORDER — LISINOPRIL 20 MG PO TABS: ORAL_TABLET | ORAL | Status: DC

## 2014-06-21 MED ORDER — TRIAMTERENE-HCTZ 37.5-25 MG PO CAPS: ORAL_CAPSULE | ORAL | Status: DC

## 2014-06-21 MED ORDER — OMEPRAZOLE 20 MG PO CPDR: DELAYED_RELEASE_CAPSULE | ORAL | Status: DC

## 2014-06-21 NOTE — Telephone Encounter (Signed)
Spoke to patient. She needs refills of omeprazole, lisinopril, and Dyazide (apparently was only given 30 tablets of lisinopril and Dyazide by [facility]), does not need refill of Effexor as she has recently changed to Celexa.

## 2014-08-20 ENCOUNTER — Ambulatory Visit: Payer: Commercial Managed Care - PPO | Admitting: Gastroenterology

## 2014-08-20 NOTE — Progress Notes (Signed)
Patient ID: Sherry Anderson, female   DOB: Sep 01, 1960, 54 y.o.   MRN: [id number] Patient no-showed today's appointment; provider notified for review of record.    Please send a no-show letter to the patient recommending that they reschedule the appointment.   Also send a letter to the referring provider alerting them of the NO SHOW.

## 2014-09-05 ENCOUNTER — Other Ambulatory Visit: Payer: Self-pay | Admitting: *Deleted

## 2014-09-09 ENCOUNTER — Other Ambulatory Visit: Payer: Self-pay | Admitting: *Deleted

## 2014-10-14 ENCOUNTER — Other Ambulatory Visit: Payer: Self-pay | Admitting: Family Medicine

## 2014-10-14 NOTE — Telephone Encounter (Signed)
Note to staff - please let patient know that I can not refill lamisil for toenail fungus unless she is evaluated in office again

## 2014-10-14 NOTE — Telephone Encounter (Signed)
Tried calling Aquilla Solian, CMA), left message for patient to call us back.

## 2014-10-16 ENCOUNTER — Encounter: Payer: Self-pay | Admitting: Gastroenterology

## 2014-10-16 ENCOUNTER — Ambulatory Visit (INDEPENDENT_AMBULATORY_CARE_PROVIDER_SITE_OTHER): Payer: Commercial Managed Care - PPO | Admitting: Gastroenterology

## 2014-10-16 VITALS — BP 118/70 | HR 68 | Ht 64.0 in | Wt 157.2 lb

## 2014-10-16 DIAGNOSIS — R109 Unspecified abdominal pain: Secondary | ICD-10-CM

## 2014-10-16 NOTE — Progress Notes (Signed)
HPI: This is a  very pleasant 54 year old woman whom I am meeting for the first time today.  This patient was referred to me by Dr. Lindell Noe to evaluate  GERD.  Labs 06/2014 cbc, cmet were normal  For a long time she has been on ppi, omeprazole.  Has been on 20mg  pill, bid (before dinner and at bedtime).  Normally eats dinner at 4pm, lays down 9pm.  Drinks one soda daily.  No dysphagia.  Overall stable weight.  Takes tylenol only for pains.  She has pyrosis, especially at night; waterbrash.   Laying down. She sill sit up, drink water.  Going on for 2 years.  Review of systems: Pertinent positive and negative review of systems were noted in the above HPI section. Complete review of systems was performed and was otherwise normal.    Past Medical History  Diagnosis Date  . Breast cancer 2002  . Absence of menstruation   . Carpal tunnel syndrome   . Depressive disorder, not elsewhere classified   . Other malaise and fatigue   . Other specified gastritis without mention of hemorrhage   . Headache(784.0)   . Unspecified hearing loss     right ear  . Other and unspecified hyperlipidemia   . Essential hypertension, benign   . Lateral epicondylitis  of elbow   . Myalgia and myositis, unspecified     Past Surgical History  Procedure Laterality Date  . Cesarean section  1990  . Breast lumpectomy  2002    ductal CA in situ    Current Outpatient Prescriptions  Medication Sig Dispense Refill  . Calcium Carbonate-Vitamin D (CALTRATE 600+D) 600-400 MG-UNIT per tablet Take 1 tablet by mouth 2 (two) times daily.      . citalopram (CELEXA) 20 MG tablet Take 1 tablet (20 mg total) by mouth daily. 30 tablet 3  . lisinopril (PRINIVIL,ZESTRIL) 20 MG tablet TAKE 1 TABLET DAILY 90 tablet 1  . omeprazole (PRILOSEC) 20 MG capsule Take 1 capsule (20 mg total) by mouth 2 (two) times daily. 180 capsule 0  . triamterene-hydrochlorothiazide (DYAZIDE) 37.5-25 MG per capsule TAKE 1 CAPSULE DAILY  90 capsule 1  . venlafaxine XR (EFFEXOR-XR) 150 MG 24 hr capsule TAKE 1 CAPSULE DAILY 90 capsule 0  . omeprazole (PRILOSEC) 20 MG capsule TAKE 1 CAPSULE TWICE DAILY (Patient not taking: Reported on 10/16/2014) 180 capsule 1  . phenol (CHLORASEPTIC) 1.4 % LIQD Use as directed 2 sprays in the mouth or throat as needed for throat irritation / pain. (Patient not taking: Reported on 10/16/2014) 118 mL 0  . terbinafine (LAMISIL) 250 MG tablet Take 1 tablet (250 mg total) by mouth daily. (Patient not taking: Reported on 10/16/2014) 90 tablet 0  . venlafaxine XR (EFFEXOR-XR) 75 MG 24 hr capsule Take 1 tablet by mouth one time daily together with venlafaxine ER 150mg , for a total daily dose of 225mg . (Patient not taking: Reported on 10/16/2014) 90 capsule 3   No current facility-administered medications for this visit.    Allergies as of 10/16/2014  . (No Known Allergies)    Family History  Problem Relation Age of Onset  . Diabetes Mother   . Hypertension Mother   . Thyroid disease Sister   . Schizophrenia Brother   . Cancer Maternal Aunt   . Diabetes      family history    History   Social History  . Marital Status: Legally Separated    Spouse Name: N/A  . Number of Children: 3  .  Years of Education: 6   Occupational History  .     Social History Main Topics  . Smoking status: Never Smoker   . Smokeless tobacco: Not on file  . Alcohol Use: Not on file  . Drug Use: No  . Sexual Activity: Not on file   Other Topics Concern  . Not on file   Social History Narrative   Moved from [place] to [place], to [place]   3 sons, Dellis Filbert in [place], another in [place], [place] at [employer] live in [place] with her     Physical Exam: BP 118/70 mmHg  Pulse 68  Ht 5\' 4"  (1.626 m)  Wt 157 lb 3.2 oz (71.305 kg)  BMI 26.97 kg/m2 Constitutional: generally well-appearing Psychiatric: alert and oriented x3 Eyes: extraocular movements intact Mouth: oral pharynx moist, no lesions Neck:  supple no lymphadenopathy Cardiovascular: heart regular rate and rhythm Lungs: clear to auscultation bilaterally Abdomen: soft, nontender, nondistended, no obvious ascites, no peritoneal signs, normal bowel sounds Extremities: no lower extremity edema bilaterally Skin: no lesions on visible extremities    Assessment and plan: 54 y.o. female with  chronic GERD, waterbrash  She is going to change the way she is safe proton pump inhibitor so that she is taking omeprazole 40 mg every morning shortly before breakfast. She will start taking H2 blocker at bedtime every night. This is a much better overnight regimen for GERD. I will proceed with EGD at her soonest to check for underlying gastritis, peptic ulcer disease, GERD complications.   Cc: Willeen Niece, MD [address] [place], [place] [address]

## 2014-10-16 NOTE — Patient Instructions (Signed)
You will be set up for an upper endoscopy. You should change the way you are taking your antiacid medicines; take 40mg  of omeprazole in AM 20-30 min before breakast. Start taking ranitidine 150mg  pill, take one pill at bedtime every night.

## 2014-10-21 ENCOUNTER — Ambulatory Visit (INDEPENDENT_AMBULATORY_CARE_PROVIDER_SITE_OTHER): Payer: Commercial Managed Care - PPO | Admitting: Family Medicine

## 2014-10-21 ENCOUNTER — Encounter: Payer: Self-pay | Admitting: Family Medicine

## 2014-10-21 VITALS — BP 130/70 | HR 66 | Temp 98.2°F | Ht 64.0 in | Wt 157.3 lb

## 2014-10-21 DIAGNOSIS — B351 Tinea unguium: Secondary | ICD-10-CM

## 2014-10-21 DIAGNOSIS — I10 Essential (primary) hypertension: Secondary | ICD-10-CM

## 2014-10-21 DIAGNOSIS — F329 Major depressive disorder, single episode, unspecified: Secondary | ICD-10-CM

## 2014-10-21 DIAGNOSIS — M5441 Lumbago with sciatica, right side: Secondary | ICD-10-CM

## 2014-10-21 DIAGNOSIS — F32A Depression, unspecified: Secondary | ICD-10-CM

## 2014-10-21 DIAGNOSIS — M5136 Other intervertebral disc degeneration, lumbar region: Secondary | ICD-10-CM | POA: Insufficient documentation

## 2014-10-21 MED ORDER — VENLAFAXINE HCL ER 150 MG PO CP24: 150.0000 mg | ORAL_CAPSULE | Freq: Every day | ORAL | Status: DC

## 2014-10-21 NOTE — Progress Notes (Signed)
   Subjective:    Patient ID: Sherry Anderson, female    DOB: 05-08-1961, 54 y.o.   MRN: [id number]  HPI 54 y/o female presents for refill of Venlafaxine. Ran out of medication 2 days ago.   Depression - intermittent since partial mastectomy in 2001, in late 2015 was transitioned from Venlafaxine to Celexa due to poor therapeutic effect, patient however did not tolerate Celexa, reports associated shaking and tingling sensation in extremities, restarted Venlafaxine at 150 mg daily, symptoms resolved, patient continues to have depressed mood which is partially situational, lives alone as boyfriend if often traveling for business, children no longer in household, works for a [employer] and has poor working relationship with supervisor, states that she sleeps well, no difficulty concentrating, no SI, no HI. Denies history of suicidal thoughts.   Low back pain - patient started having low back pain (bilateral lumbar paraspinal) a few months ago, had associated radicular symptoms down right leg, mild associated numbness and weakness of the right lower extremity, went to [facility], had MRI which is not viewable in EPIC, on pain medication (patient does not know name), symptoms have improved, no bladder or bowel incontinence  HTN - no chest pain, no headaches, no vision changes, reports compliance with medications  Social - lives with boyfriend, never a smoker   Review of Systems  Constitutional: Negative for fever, chills and fatigue.  Respiratory: Negative for shortness of breath.   Cardiovascular: Negative for chest pain.  Gastrointestinal: Negative for nausea, vomiting and diarrhea.  Psychiatric/Behavioral: Negative for suicidal ideas, hallucinations, confusion, self-injury, dysphoric mood and decreased concentration.       Objective:   Physical Exam Vitals: reviewed Gen: pleasant female, intermittently tearful HEENT: normocephalic, PERRL,  EOMI, no scleral icterus, MMM Cardiac: RRR, S1 and S2 present, no murmur, no heaves/thrills Resp: CTAB, normal effort Ext: no edema, slight yellowing and onchyomycosis of bilateral toes Psych: tearful, affect is slightly labile, dressed appropriately, normal thought process, does not appear to be responding to internal stimuli, no SI, no HI MSK: bilateral lumbar paraspinal tenderness, no midline tenderness, SLT negative bilaterally Neuro: strength 5/5 bilaterally to hip flexion/leg flexion and extension/ foot plantar and dorsiflexion, 2+ patellar reflexes, no clonus, sensations to light touch intact in lower extremities      Assessment & Plan:  Please see problem specific assessment and plan.

## 2014-10-21 NOTE — Assessment & Plan Note (Signed)
Patient did not tolerate Celexa. Improved symptoms when back on Venlafaxine. Still has some emotional liability however is without SI or HI. -continue Venlafaxine 150 mg daily

## 2014-10-21 NOTE — Patient Instructions (Signed)
It was nice to meet you.  Depressed Mood - continue Effexor 150 mg daily, a refill has been sent to pharmacy  Blood Pressure - well controlled, continue your current medications  Low Back Pain - Dr. Ree Kida will attempt to get records, please call in the name of the pain medication that you are taking  Return in 1-2 months to follow up on your mood.

## 2014-10-21 NOTE — Assessment & Plan Note (Signed)
Patient has few month history of lumbar back pain with right sided radiculopathy. Improved with unknown pain medication. Patient seen at [facility]. MRI completed however results not viewable in EPIC. -send for records -patient to call in name of current pain medication -no change in therapy as patient appears to be responding.

## 2014-10-21 NOTE — Assessment & Plan Note (Signed)
Improved s/p treatment with Terbinifine.

## 2014-10-21 NOTE — Assessment & Plan Note (Signed)
Controlled on current regimen.  -no change in therapy

## 2014-10-25 ENCOUNTER — Encounter: Payer: Self-pay | Admitting: Family Medicine

## 2014-10-30 ENCOUNTER — Other Ambulatory Visit: Payer: Self-pay | Admitting: Family Medicine

## 2014-11-13 ENCOUNTER — Encounter (HOSPITAL_COMMUNITY): Payer: Self-pay

## 2014-11-13 ENCOUNTER — Encounter: Payer: Self-pay | Admitting: Family Medicine

## 2014-11-13 ENCOUNTER — Ambulatory Visit (INDEPENDENT_AMBULATORY_CARE_PROVIDER_SITE_OTHER): Payer: Commercial Managed Care - PPO | Admitting: Family Medicine

## 2014-11-13 ENCOUNTER — Ambulatory Visit (HOSPITAL_COMMUNITY)
Admission: RE | Admit: 2014-11-13 | Discharge: 2014-11-13 | Disposition: A | Discharge status: Home / Self Care | Payer: Commercial Managed Care - PPO | Source: Ambulatory Visit | Attending: Family Medicine | Admitting: Family Medicine

## 2014-11-13 VITALS — BP 132/81 | HR 71 | Temp 98.1°F | Ht 64.0 in | Wt 153.0 lb

## 2014-11-13 DIAGNOSIS — H9041 Sensorineural hearing loss, unilateral, right ear, with unrestricted hearing on the contralateral side: Secondary | ICD-10-CM | POA: Diagnosis present

## 2014-11-13 DIAGNOSIS — Z853 Personal history of malignant neoplasm of breast: Secondary | ICD-10-CM | POA: Diagnosis not present

## 2014-11-13 DIAGNOSIS — H905 Unspecified sensorineural hearing loss: Secondary | ICD-10-CM | POA: Diagnosis not present

## 2014-11-13 DIAGNOSIS — R42 Dizziness and giddiness: Secondary | ICD-10-CM | POA: Diagnosis not present

## 2014-11-13 DIAGNOSIS — H9201 Otalgia, right ear: Secondary | ICD-10-CM | POA: Insufficient documentation

## 2014-11-13 LAB — BASIC METABOLIC PANEL
BUN: 12 mg/dL (ref 6–23)
CO2: 25 mEq/L (ref 19–32)
Calcium: 9.4 mg/dL (ref 8.4–10.5)
Chloride: 101 mEq/L (ref 96–112)
Creat: 0.69 mg/dL (ref 0.50–1.10)
GLUCOSE: 115 mg/dL — AB (ref 70–99)
POTASSIUM: 3.6 meq/L (ref 3.5–5.3)
SODIUM: 136 meq/L (ref 135–145)

## 2014-11-13 LAB — CBC WITH DIFFERENTIAL/PLATELET
Basophils Absolute: 0 10*3/uL (ref 0.0–0.1)
Basophils Relative: 0 % (ref 0–1)
Eosinophils Absolute: 0.2 10*3/uL (ref 0.0–0.7)
Eosinophils Relative: 4 % (ref 0–5)
HEMATOCRIT: 37.4 % (ref 36.0–46.0)
Hemoglobin: 13.1 g/dL (ref 12.0–15.0)
LYMPHS ABS: 2.2 10*3/uL (ref 0.7–4.0)
Lymphocytes Relative: 44 % (ref 12–46)
MCH: 30.5 pg (ref 26.0–34.0)
MCHC: 35 g/dL (ref 30.0–36.0)
MCV: 87 fL (ref 78.0–100.0)
MONO ABS: 0.4 10*3/uL (ref 0.1–1.0)
MPV: 10.5 fL (ref 8.6–12.4)
Monocytes Relative: 7 % (ref 3–12)
NEUTROS ABS: 2.3 10*3/uL (ref 1.7–7.7)
Neutrophils Relative %: 45 % (ref 43–77)
PLATELETS: 298 10*3/uL (ref 150–400)
RBC: 4.3 MIL/uL (ref 3.87–5.11)
RDW: 13.4 % (ref 11.5–15.5)
WBC: 5 10*3/uL (ref 4.0–10.5)

## 2014-11-13 LAB — POCT SEDIMENTATION RATE: POCT SED RATE: 25 mm/h — AB (ref 0–22)

## 2014-11-13 MED ORDER — IOHEXOL 300 MG/ML  SOLN
80.0000 mL | Freq: Once | INTRAMUSCULAR | Status: AC | PRN
  Administered 2014-11-13: 80 mL via INTRAVENOUS

## 2014-11-13 NOTE — Assessment & Plan Note (Addendum)
R sided hearing loss On screening has normal air conduction on screening in office, on weber test lateralizes to L and no bone conduction on R mastoid but still can hear via air conduction She does have some R sided TMJ pain but this is distinctly different Also with eye pain, blurred vision and R sided temple pain.  Vision screen normal 20/20 Neuro exam only significant for R sided tongue deviation With wave like tinitus, Vision changes, and R tongue deviation I am at least concerned enough for stroke or mass that I have ordered a stat CT head today,.  Consider meniere's disease Also consider temporal arteritis, ESR today If workup negative would recommend referral to ENT for consideration of meniere's disease With odd constellation of symptoms would also consider psychogenic etiology Very close follow up tomorrow, if significant results found will contact as appropriate today.  I recommended 600 mg motrin for pain, also hold lisinopril X 3 days with contrast media today.

## 2014-11-13 NOTE — Progress Notes (Signed)
Patient ID: Sherry Anderson, female   DOB: Jul 01, 1961, 54 y.o.   MRN: [id number]   HPI  Patient presents today for hearing loss and eye pain  Patient exaplains that she had sudden onset hearing loss about 2 days ago. She denies trauma or inciting event. She denies muffled hearing or any hearing at all. She describes wave like tinnitus and intermittent dizziness and unsteadiness on her feet without any falls.   She also describes normal vision with increased intermittent blurring (has some at baseline with eye fatigue but feels is worse), as well as intermittent pinching type pain of the eye.   Currently she is having R sided throbbing temple pain as well. She has had frontal/bitemporal HA on and off since the onset of symptoms.   She denies any previous episodes of similar pain or hearing loss.   Smoking status noted ROS: Per HPI +  No fever No ear pain No change in appetite or malaise.   Objective: BP 132/81 mmHg  Pulse 71  Temp(Src) 98.1 F (36.7 C) (Oral)  Ht 5' 4"  (1.626 m)  Wt 153 lb (69.4 kg)  BMI 26.25 kg/m2 Gen: NAD, alert, cooperative with exam HEENT: NCAT, L TM WNL, R tm with orange/brown lesion on TM about 5'oclock possibly cerumen or old scar  Tuning fork: weber lateralizes to L, R ear with no bone conduction from  mastoid but intact air conduction  No Papilledema appreciated  R sided pain at TMJ with palpation and mouth opening Neuro: Alert and oriented, strength 5/5 in BL Upper and lower extremities, normal gait, CN 2-12 with R sided tongue deviation Psych: slightly odd affect, answers seem inconsistent at times- such as saying no hearing att all but still having intact air cpnduction  Snellen 20/20 BL Hearing screening normal in clinic today   Assessment and plan:  Right-sided sensorineural hearing loss R sided hearing loss On screening has normal air conduction on screening in office, on weber test lateralizes to L and no bone conduction on R mastoid  but still can hear via air conduction She does have some R sided TMJ pain but this is distinctly different Also with eye pain, blurred vision and R sided temple pain.  Vision screen normal 20/20 Neuro exam only significant for R sided tongue deviation With wave like tinitus, Vision changes, and R tongue deviation I am at least concerned enough for stroke or mass that I have ordered a stat CT head today,.  Consider meniere's disease Also consider temporal arteritis, ESR today If workup negative would recommend referral to ENT for consideration of meniere's disease With odd constellation of symptoms would also consider psychogenic etiology Very close follow up tomorrow, if significant results found will contact as appropriate today.  I recommended 600 mg motrin for pain, also hold lisinopril X 3 days with contrast media today.         Orders Placed This Encounter  Procedures  . CT Head W Wo Contrast    Standing Status: Future     Number of Occurrences:      Standing Expiration Date: 02/13/2016    Order Specific Question:  Reason for Exam (SYMPTOM  OR DIAGNOSIS REQUIRED)    Answer:  suspect stroke    Order Specific Question:  Is the patient pregnant?    Answer:  No    Order Specific Question:  Preferred imaging location?    Answer:  [facility]  . CBC with Differential  . Basic Metabolic Panel  . POCT SEDIMENTATION RATE

## 2014-11-13 NOTE — Patient Instructions (Addendum)
Great to meet you!  I have arranged a CT scan for you, Also we will look at several labs We have also scheduled a follow up for you tomorrow.  If something comes back very abnormal you will hear from [place] today, iotherwise come back tomorrow for follow up on what we are doing.    If you develop signs of a stroke please go to the Emergency department immediately. These include difficulty talking, walking, ones sided weakness or numbness.   SYMPTOMS  These symptoms usually develop suddenly, or may be newly present upon awakening from sleep:  Sudden weakness or numbness of the face, arm, or leg, especially on one side of the body.  Sudden trouble walking or difficulty moving arms or legs.  Sudden confusion.  Sudden personality changes.  Trouble speaking (aphasia) or understanding.  Difficulty swallowing.  Sudden trouble seeing in one or both eyes.  Double vision.  Dizziness.  Loss of balance or coordination.  Sudden severe headache with no known cause.  Trouble reading or writing. SEEK MEDICAL CARE IF:  You have personality changes.  You have difficulty swallowing.  You are seeing double.  You have dizziness.  You have a fever.  You have skin breakdown. SEEK IMMEDIATE MEDICAL CARE IF:  Any of these symptoms may represent a serious problem that is an emergency. Do not wait to see if the symptoms will go away. Get medical help right away. Call your local emergency services (911 in [place] Do not drive yourself to the hospital.  You have sudden weakness or numbness of the face, arm, or leg, especially on one side of the body.  You have sudden trouble walking or difficulty moving arms or legs.  You have sudden confusion.  You have trouble speaking (aphasia) or understanding.  You have sudden trouble seeing in one or both eyes.  You have a loss of balance or coordination.  You have a sudden, severe headache with no known cause.  You have new chest pain or an  irregular heartbeat.  You have a partial or total loss of consciousness.

## 2014-11-13 NOTE — Addendum Note (Signed)
Addended by: Timmothy Euler on: 11/13/2014 12:22 PM   Modules accepted: Level of Service

## 2014-11-14 ENCOUNTER — Ambulatory Visit: Payer: Commercial Managed Care - PPO | Admitting: Family Medicine

## 2014-11-14 ENCOUNTER — Ambulatory Visit (INDEPENDENT_AMBULATORY_CARE_PROVIDER_SITE_OTHER): Payer: Commercial Managed Care - PPO | Admitting: Family Medicine

## 2014-11-14 ENCOUNTER — Encounter: Payer: Self-pay | Admitting: Family Medicine

## 2014-11-14 VITALS — BP 127/82 | HR 78 | Temp 98.0°F | Ht 64.0 in | Wt 156.0 lb

## 2014-11-14 DIAGNOSIS — H905 Unspecified sensorineural hearing loss: Secondary | ICD-10-CM

## 2014-11-14 NOTE — Progress Notes (Signed)
Patient ID: Sherry Anderson, female   DOB: 1961/05/26, 54 y.o.   MRN: [id number]   HPI  Patient presents today for hearing loss and eye pain  Patient exaplains that she had sudden onset hearing loss about 4 days ago. She denies trauma or inciting event. She denies muffled hearing or any hearing at all. She describes wave like tinnitus and intermittent dizziness and unsteadiness on her feet without any falls.   She also describes normal vision with increased intermittent blurring (has some at baseline with eye fatigue but feels is worse), as well as intermittent pinching type pain of the eye.   Seen yesterday in [facility] and had CT head to r/o acute CVA (negative exam), CBC which was negative for infectious process and ESR of 25 which is basically normal for her age range.  Denies new Sx or change from yesterday.  Denies recent URI or sick contacts or travel or loud noises at work place   She denies any previous episodes of similar pain or hearing loss.   ROS: Per HPI +  No fever No ear pain No change in appetite or malaise.   Objective: BP 127/82 mmHg  Pulse 78  Temp(Src) 98 F (36.7 C) (Oral)  Ht _0  (1.626 m)  Wt 156 lb (70.761 kg)  BMI 26.76 kg/m2 Gen: NAD, alert, cooperative with exam HEENT: NCAT, L TM WNL, R tm basically n ml   Tuning fork: weber lateralizes to L, R ear with no bone conduction from  mastoid but intact air conduction  R sided pain at TMJ with palpation and mouth opening Neuro: Alert and oriented, strength 5/5 in BL Upper and lower extremities, normal gait, CN 2-12 with R sided tongue deviation

## 2014-11-14 NOTE — Assessment & Plan Note (Signed)
Going back through notes, this seems to be chronic issue for her.  Still with R ear fullness and decreased hearing.  TMI R side with Weber lateralizing to L still.  Air Conduction > Bone conduction - Referral to ENT.  May be ongoing Meniere's vs Labyrinthitis vs vestibular neuritis  - F/U with PCP in 3-4 weeks after appt

## 2014-11-19 ENCOUNTER — Encounter: Payer: Self-pay | Admitting: Gastroenterology

## 2014-11-22 ENCOUNTER — Encounter: Payer: Self-pay | Admitting: Gastroenterology

## 2014-11-22 ENCOUNTER — Ambulatory Visit (AMBULATORY_SURGERY_CENTER): Payer: Commercial Managed Care - PPO | Admitting: Gastroenterology

## 2014-11-22 VITALS — BP 103/69 | HR 62 | Temp 97.3°F | Resp 16 | Ht 64.0 in | Wt 157.0 lb

## 2014-11-22 DIAGNOSIS — R1084 Generalized abdominal pain: Secondary | ICD-10-CM

## 2014-11-22 DIAGNOSIS — K2970 Gastritis, unspecified, without bleeding: Secondary | ICD-10-CM | POA: Diagnosis not present

## 2014-11-22 DIAGNOSIS — K2990 Gastroduodenitis, unspecified, without bleeding: Secondary | ICD-10-CM | POA: Diagnosis not present

## 2014-11-22 DIAGNOSIS — B9681 Helicobacter pylori [H. pylori] as the cause of diseases classified elsewhere: Secondary | ICD-10-CM | POA: Diagnosis not present

## 2014-11-22 MED ORDER — SODIUM CHLORIDE 0.9 % IV SOLN: 500.0000 mL | INTRAVENOUS | Status: DC

## 2014-11-22 NOTE — Progress Notes (Signed)
A/ox3 pleased with MAC, report to Tracy RN 

## 2014-11-22 NOTE — Op Note (Signed)
[facility]  [address]. [place], [address]   ENDOSCOPY PROCEDURE REPORT  PATIENT: Sherry, Anderson  MR#: [id number] BIRTHDATE: 12-Dec-1960 , 53  yrs. old GENDER: female ENDOSCOPIST: Milus Banister, MD PROCEDURE DATE:  11/22/2014 PROCEDURE:  EGD w/ biopsy ASA CLASS:     Class II INDICATIONS:  GERD, dyspepsia, epigastric pain. MEDICATIONS: Monitored anesthesia care and Propofol 100 mg IV TOPICAL ANESTHETIC: none  DESCRIPTION OF PROCEDURE: After the risks benefits and alternatives of the procedure were thoroughly explained, informed consent was obtained.  The LB [id number] endoscope was introduced through the mouth and advanced to the second portion of the duodenum , Without limitations.  The instrument was slowly withdrawn as the mucosa was fully examined.   There was mild, non specific pan-gastritis.  This was biopsied distally and sent to pathology.  There was a 4cm hiatal hernia. The examination was otherwise normal.  Retroflexed views revealed no abnormalities.     The scope was then withdrawn from the patient and the procedure completed.  COMPLICATIONS: There were no immediate complications.  ENDOSCOPIC IMPRESSION: There was mild, non specific pan-gastritis.  This was biopsied distally and sent to pathology.  There was a 4cm hiatal hernia. The examination was otherwise normal  RECOMMENDATIONS: Continue your antiacid meds as previously discussed.  If the biopsies show H.  pylori, you will be started on appropriate antibiotics.  If the biopsies are negative, will likely arrange abdominal US to check for gallstones.  eSigned:  Milus Banister, MD 11/22/2014 3:51 PM

## 2014-11-22 NOTE — Patient Instructions (Signed)
Impressions/recommendations:  Gastritis (handout given) Hiatal hernia (handout given)  Continue antacid medications.  YOU HAD AN ENDOSCOPIC PROCEDURE TODAY AT [facility]:   Refer to the procedure report that was given to you for any specific questions about what was found during the examination.  If the procedure report does not answer your questions, please call your gastroenterologist to clarify.  If you requested that your care partner not be given the details of your procedure findings, then the procedure report has been included in a sealed envelope for you to review at your convenience later.  YOU SHOULD EXPECT: Some feelings of bloating in the abdomen. Passage of more gas than usual.  Walking can help get rid of the air that was put into your GI tract during the procedure and reduce the bloating. If you had a lower endoscopy (such as a colonoscopy or flexible sigmoidoscopy) you may notice spotting of blood in your stool or on the toilet paper. If you underwent a bowel prep for your procedure, you may not have a normal bowel movement for a few days.  Please Note:  You might notice some irritation and congestion in your nose or some drainage.  This is from the oxygen used during your procedure.  There is no need for concern and it should clear up in a day or so.  SYMPTOMS TO REPORT IMMEDIATELY:   Following upper endoscopy (EGD)  Vomiting of blood or coffee ground material  New chest pain or pain under the shoulder blades  Painful or persistently difficult swallowing  New shortness of breath  Fever of 100F or higher  Black, tarry-looking stools  For urgent or emergent issues, a gastroenterologist can be reached at any hour by calling [phone].   DIET: Your first meal following the procedure should be a small meal and then it is ok to progress to your normal diet. Heavy or fried foods are harder to digest and may make you feel nauseous or bloated.  Likewise,  meals heavy in dairy and vegetables can increase bloating.  Drink plenty of fluids but you should avoid alcoholic beverages for 24 hours.  ACTIVITY:  You should plan to take it easy for the rest of today and you should NOT DRIVE or use heavy machinery until tomorrow (because of the sedation medicines used during the test).    FOLLOW UP: Our staff will call the number listed on your records the next business day following your procedure to check on you and address any questions or concerns that you may have regarding the information given to you following your procedure. If we do not reach you, we will leave a message.  However, if you are feeling well and you are not experiencing any problems, there is no need to return our call.  We will assume that you have returned to your regular daily activities without incident.  If any biopsies were taken you will be contacted by phone or by letter within the next 1-3 weeks.  Please call us at [phone] if you have not heard about the biopsies in 3 weeks.    SIGNATURES/CONFIDENTIALITY: You and/or your care partner have signed paperwork which will be entered into your electronic medical record.  These signatures attest to the fact that that the information above on your After Visit Summary has been reviewed and is understood.  Full responsibility of the confidentiality of this discharge information lies with you and/or your care-partner.

## 2014-11-22 NOTE — Progress Notes (Signed)
Called to room to assist during endoscopic procedure.  Patient ID and intended procedure confirmed with present staff. Received instructions for my participation in the procedure from the performing physician.  

## 2014-11-25 ENCOUNTER — Telehealth: Payer: Self-pay | Admitting: *Deleted

## 2014-11-25 NOTE — Telephone Encounter (Signed)
No answer, left message to call if questions or concerns. 

## 2014-11-28 ENCOUNTER — Other Ambulatory Visit: Payer: Self-pay

## 2014-11-28 MED ORDER — CLARITHROMYCIN 500 MG PO TABS: 500.0000 mg | ORAL_TABLET | Freq: Two times a day (BID) | ORAL | Status: DC

## 2014-11-28 MED ORDER — AMOXICILLIN 500 MG PO TABS: 1000.0000 mg | ORAL_TABLET | Freq: Two times a day (BID) | ORAL | Status: DC

## 2014-11-28 NOTE — Telephone Encounter (Signed)
Allergies and meds verified and pt notified of biopsy results and told that meds were sent to the pharmacy.  She was also notified to increase omeprazole to bid x 14 days.

## 2014-12-03 ENCOUNTER — Ambulatory Visit (INDEPENDENT_AMBULATORY_CARE_PROVIDER_SITE_OTHER): Payer: Commercial Managed Care - PPO | Admitting: Family Medicine

## 2014-12-03 ENCOUNTER — Encounter: Payer: Self-pay | Admitting: Family Medicine

## 2014-12-03 VITALS — BP 127/83 | HR 82 | Temp 98.1°F | Ht 64.0 in | Wt 158.1 lb

## 2014-12-03 DIAGNOSIS — K2970 Gastritis, unspecified, without bleeding: Secondary | ICD-10-CM

## 2014-12-03 DIAGNOSIS — F418 Other specified anxiety disorders: Secondary | ICD-10-CM

## 2014-12-03 DIAGNOSIS — H905 Unspecified sensorineural hearing loss: Secondary | ICD-10-CM | POA: Diagnosis not present

## 2014-12-03 DIAGNOSIS — K2990 Gastroduodenitis, unspecified, without bleeding: Secondary | ICD-10-CM | POA: Diagnosis not present

## 2014-12-03 NOTE — Assessment & Plan Note (Addendum)
Patient presents today with increased fatigue. Most likely a manifestation of her depression. She reports taking care of her elderly parents full-time which causes increased stress and means she has no time to take care of herself. She also notes increased anxiety over her family members, work, and recent H. Pylori diagnosis. Tearful in office today. No SI/HI.  - continue 150mg  Venlafaxine - offered psychology referral, patient declined at this time - follow-up in 1 month to re-evaluate mood, consider addition of Buspirone due to symptoms of anxiety.  Written by: Dois Davenport, MS3  I agree with the medical student assessment and plan. Continue Venlafaxine at current dose. Consider addition of Buspar. Patient encouraged to make time for herself and perform activities that she enjoys. Dossie Arbour MD

## 2014-12-03 NOTE — Assessment & Plan Note (Addendum)
Post-diagnosis of H.Pylori gastritis, patient is taking amoxicillin, clarithromycin, and omeprazole. Intermittent epigastric pain a few times a week lasting approximately 30 minutes. Unchanged with food intake.  - continue triple therapy - follow-up with GI for colonoscopy  Written by: Dois Davenport, MS3  I agree with the medical student assessment and plan. Little change in GERD/H. Pylori treated with clarithromycin/amoxicillin/omeprazole. As only recently started therapy to changes today.  Dossie Arbour MD

## 2014-12-03 NOTE — Patient Instructions (Signed)
Mood/Anxiety - please continue Venlafaxine, make time for yourself everyday (even if its only 10 minutes)  Stomach pain - continue the antibiotics as prescribed by the stomach doctors  Please talk to you stomach doctor about getting a colonoscopy.

## 2014-12-03 NOTE — Assessment & Plan Note (Addendum)
Patient reports resolution of symptoms today with normal hearing and no tinnitus. She will keep her ENT appointment on 12/06/2014.   Written by: Dois Davenport, MS3  I agree with the medical student assessment and plan. Symptoms resolved. Encouraged to keep follow up with ENT. Dossie Arbour MD

## 2014-12-03 NOTE — Progress Notes (Signed)
Subjective:     Patient ID: Sherry Anderson, female   DOB: 12/24/1960, 54 y.o.   MRN: [id number]  Written by: Dois Davenport, MS3  HPI Ms. Murley is a 54 year old female with a past medical history significant for H.pylori gastritis and depression who presents today for follow-up.   Vision/hearing changes: On 11/14/2014 patient presented with right-sided sensorineural hearing loss with wave-like tinnitus and intermittent blurry vision and dizziness. CT scan ruled out CVA. She experienced symptoms for one week that have since completely resolved. She denies hearing and vision changes today. She has an upcoming ENT appointment for further evaluation.   Depression/Anxiety: She reports increased fatigue over the past couple of weeks. The patient attributed the symptoms to recently-initiated therapy with clarithromycin and amoxicillin. She is sleeping 8-9 hours a night and napping when she has time. She denies anhedonia, but does not have time to walk, which she enjoys doing, due to caring for her parents. She cares for her parents full-time when she is not working and reports that it is very time-consuming. 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 She reports feeling increasingly unsettled and nervous, exacerbated by concern about her recent H.pylori diagnosis and stress of caring for her elderly parents. She reports that she has been anxious her entire life. Denies SI/HI today and no history of SI. She reports taking "a lot of medicines" for her mood in the past and her current medicine, Venlafaxine, is the only one that worked. She takes 150mg /day and did not tolerate the dose increase to 225mg  due to increased nausea. Denies difficulty concentration, no changes in weight.  GERD/H. Pylori - recently initiated therapy with Clarithromycin/Amoxicillin/Omeprazole per GI, no change in symtpoms  Social - lives with brother and parents on days that she is working (usually 5 days  per week), on off days she lives in her own home (nephew lives with her).   Review of Systems Gen: no change in weight, no fever or chills HEENT: no headache, rhinorrhea, sore throat, or cough GI: persistent intermittent epigastric pain, no diarrhea     Objective:   Physical Exam  Vitals: Reviewed Gen: well-appearing in NAD, teary throughout encounter HEENT: PERRLA, EOMI. Clear tympanic membranes bilaterally. No cervical or submandibular LAD. CV: no MRG, RRR, nl S1 and S2 Pulmonary: CTAB, no wheezes or crackles Abdominal: normoactive bowel sounds, no tenderness to palpation Neuro: CN II-XII intact. Grip strength 5/5 bilaterally. All extremities sensitive to light touch. Strength 5/5 in all extremities.  Psych: patient down and depressed throughout encounter, no tangential thoughts, does not appear to be responding to internal stimuli, no SI or HI.    Assessment:     Please see Problem List.     Plan:     Please see Problem List.     I personally saw and evaluated the patient. I have reviewed the medical student note and agree with the documentation. I personally completed the physical exam. Additions to the medical student note are made in blue.   Dossie Arbour MD

## 2014-12-24 ENCOUNTER — Other Ambulatory Visit: Payer: Self-pay | Admitting: Family Medicine

## 2015-02-26 ENCOUNTER — Other Ambulatory Visit: Payer: Self-pay

## 2015-02-26 DIAGNOSIS — Z1231 Encounter for screening mammogram for malignant neoplasm of breast: Secondary | ICD-10-CM

## 2015-03-10 ENCOUNTER — Ambulatory Visit
Admission: RE | Admit: 2015-03-10 | Discharge: 2015-03-10 | Disposition: A | Discharge status: Home / Self Care | Payer: Commercial Managed Care - PPO | Source: Ambulatory Visit

## 2015-03-10 DIAGNOSIS — Z1231 Encounter for screening mammogram for malignant neoplasm of breast: Secondary | ICD-10-CM

## 2015-03-11 ENCOUNTER — Other Ambulatory Visit: Payer: Self-pay | Admitting: Family Medicine

## 2015-03-11 DIAGNOSIS — R928 Other abnormal and inconclusive findings on diagnostic imaging of breast: Secondary | ICD-10-CM

## 2015-03-14 ENCOUNTER — Ambulatory Visit
Admission: RE | Admit: 2015-03-14 | Discharge: 2015-03-14 | Disposition: A | Discharge status: Home / Self Care | Payer: Commercial Managed Care - PPO | Source: Ambulatory Visit | Attending: Family Medicine | Admitting: Family Medicine

## 2015-03-14 DIAGNOSIS — R928 Other abnormal and inconclusive findings on diagnostic imaging of breast: Secondary | ICD-10-CM

## 2015-04-27 ENCOUNTER — Other Ambulatory Visit: Payer: Self-pay | Admitting: Family Medicine

## 2015-06-21 ENCOUNTER — Other Ambulatory Visit: Payer: Self-pay | Admitting: Family Medicine

## 2015-06-26 ENCOUNTER — Ambulatory Visit: Payer: Commercial Managed Care - PPO | Admitting: Family Medicine

## 2015-09-22 ENCOUNTER — Other Ambulatory Visit: Payer: Self-pay | Admitting: Family Medicine

## 2015-09-23 ENCOUNTER — Other Ambulatory Visit: Payer: Self-pay | Admitting: *Deleted

## 2015-10-23 ENCOUNTER — Other Ambulatory Visit: Payer: Self-pay | Admitting: Family Medicine

## 2015-11-04 ENCOUNTER — Ambulatory Visit (INDEPENDENT_AMBULATORY_CARE_PROVIDER_SITE_OTHER): Payer: Commercial Managed Care - PPO | Admitting: Family Medicine

## 2015-11-04 ENCOUNTER — Encounter: Payer: Self-pay | Admitting: Family Medicine

## 2015-11-04 VITALS — BP 127/80 | HR 84 | Temp 98.5°F | Ht 64.0 in | Wt 156.8 lb

## 2015-11-04 DIAGNOSIS — N9089 Other specified noninflammatory disorders of vulva and perineum: Secondary | ICD-10-CM | POA: Diagnosis not present

## 2015-11-04 DIAGNOSIS — R251 Tremor, unspecified: Secondary | ICD-10-CM | POA: Diagnosis not present

## 2015-11-04 NOTE — Patient Instructions (Addendum)
I'm going to refer you to a neurologist to have the tremor evaluated. Someone will call with that appointment  Stop shaving your pubic hair and stop using soap in that area. Lets see if this area improves with those things. Follow up with Dr. Ree Kida or myself in 3-4 weeks to see how it's doing then.  Be well, Dr. Ardelia Mems

## 2015-11-04 NOTE — Progress Notes (Signed)
Date of Visit: 11/04/2015   HPI:  Patient presents to discuss tremor in R hand.  Tremor - has noticed for about one month. Not constant, notices primarily when she's writing (R handed) but sometimes at rest as well. Occurs 3-4 times per day. Her father had a tremor as well. It's interfered with her writing, which is something she has to do regularly at work. Also interferes with ability to type. Denies headache.  At end of visit patient also brought up bumps in genital area. Has noticed these for a while. Has not discussed with PCP due to being embarrassed about him being female. Not sexually active in over 1 year. No history of herpes or partners with herpes in the past. No discharge. Shaves pubic hair and uses soap in vaginal area.  ROS: See HPI.  [place]: history of breast carcinoma in situ in 2001 s/p surgery and radiation, bilateral carpal tunnel syndrome, depression/anxiety, hyperlipidemia, hypertension, R sided sensorineural hearing loss  PHYSICAL EXAM: BP 127/80 mmHg  Pulse 84  Temp(Src) 98.5 F (36.9 C) (Oral)  Ht 5\' 4"  (1.626 m)  Wt 156 lb 12.8 oz (71.124 kg)  BMI 26.90 kg/m2 Gen: NAD, pleasant, cooperative HEENT: normocephalic, atraumatic, moist mucous membranes  Heart: regular rate and rhythm, no murmur Lungs: clear to auscultation bilaterally, normal work of breathing  Neuro: cranial nerves II-XII tested and intact. Speech normal. Full strength bilat upper and lower ext. Normal FNF. Fine tremor in fingers of R hand noted when patient demonstrates her writing. No resting or other intention tremor noted. No cogwheel rigidity in either arm.  Ext: No appreciable lower extremity edema bilaterally  GU: external genitals with shaved pubic hair and two 71mm areas of slight rawness/irritation to R labia majora  ASSESSMENT/PLAN:  Tremor of right hand Differential diagnosis includes benign essential tremor, parkinsonian syndrome, or other neurological cause. Will refer to neuro for  further evaluation. May benefit from EMG.    Vulvar irritation - suspect related to shaving and soap use. Encouraged cessation of shaving and not using soap for several weeks, monitor for improvement. If not improved with those measures would consider serum testing for HSV, or swab of lesion if more blister-like on exam in future.  Follow up with PCP in several weeks.  FOLLOW UP: Follow up in 3-4 weeks with PCP for vulvar irritation Referring to neurology  [place] J. Ardelia Mems, [place]

## 2015-11-07 DIAGNOSIS — R251 Tremor, unspecified: Secondary | ICD-10-CM | POA: Insufficient documentation

## 2015-11-07 NOTE — Assessment & Plan Note (Signed)
Differential diagnosis includes benign essential tremor, parkinsonian syndrome, or other neurological cause. Will refer to neuro for further evaluation. May benefit from EMG.

## 2015-12-18 ENCOUNTER — Other Ambulatory Visit: Payer: Self-pay | Admitting: Family Medicine

## 2016-03-20 ENCOUNTER — Other Ambulatory Visit: Payer: Self-pay | Admitting: Family Medicine

## 2016-03-22 ENCOUNTER — Other Ambulatory Visit: Payer: Self-pay | Admitting: Family Medicine

## 2016-03-22 MED ORDER — VENLAFAXINE HCL ER 150 MG PO CP24: 150.0000 mg | ORAL_CAPSULE | Freq: Every day | ORAL | 1 refills | Status: DC

## 2016-03-22 NOTE — Telephone Encounter (Signed)
Pt is wanting to switch her mail order pharmacy to [facility]. Pt needs a refill on effexor. Please advise. Thanks! ep

## 2016-03-22 NOTE — Telephone Encounter (Signed)
Pharmacy changed in epic, will forward to MD

## 2016-03-22 NOTE — Telephone Encounter (Signed)
Order completed in [facility]

## 2016-04-20 ENCOUNTER — Other Ambulatory Visit: Payer: Self-pay | Admitting: Family Medicine

## 2016-05-28 ENCOUNTER — Ambulatory Visit: Payer: Commercial Managed Care - PPO | Admitting: Family Medicine

## 2016-06-15 ENCOUNTER — Other Ambulatory Visit: Payer: Self-pay | Admitting: Family Medicine

## 2016-06-24 ENCOUNTER — Other Ambulatory Visit: Payer: Self-pay | Admitting: *Deleted

## 2016-07-27 ENCOUNTER — Ambulatory Visit (INDEPENDENT_AMBULATORY_CARE_PROVIDER_SITE_OTHER): Payer: Commercial Managed Care - PPO | Admitting: Family Medicine

## 2016-07-27 ENCOUNTER — Encounter: Payer: Self-pay | Admitting: Family Medicine

## 2016-07-27 VITALS — BP 118/76 | HR 72 | Temp 98.2°F | Wt 161.0 lb

## 2016-07-27 DIAGNOSIS — E785 Hyperlipidemia, unspecified: Secondary | ICD-10-CM

## 2016-07-27 DIAGNOSIS — Z114 Encounter for screening for human immunodeficiency virus [HIV]: Secondary | ICD-10-CM | POA: Diagnosis not present

## 2016-07-27 DIAGNOSIS — I10 Essential (primary) hypertension: Secondary | ICD-10-CM

## 2016-07-27 DIAGNOSIS — Z833 Family history of diabetes mellitus: Secondary | ICD-10-CM | POA: Diagnosis not present

## 2016-07-27 DIAGNOSIS — F418 Other specified anxiety disorders: Secondary | ICD-10-CM | POA: Diagnosis not present

## 2016-07-27 DIAGNOSIS — Z1159 Encounter for screening for other viral diseases: Secondary | ICD-10-CM

## 2016-07-27 LAB — POCT GLYCOSYLATED HEMOGLOBIN (HGB A1C): HEMOGLOBIN A1C: 5.2

## 2016-07-27 MED ORDER — VENLAFAXINE HCL ER 75 MG PO CP24: 225.0000 mg | ORAL_CAPSULE | Freq: Every day | ORAL | 0 refills | Status: DC

## 2016-07-27 NOTE — Assessment & Plan Note (Signed)
Hyperlipidemia on problem list, no recent assessment of lipids. Will check lipid panel today. Initiate statin if indicated based on results.

## 2016-07-27 NOTE — Assessment & Plan Note (Signed)
Well controlled. Continue current regimen. Check lipids & CMET today. Patient has family history of diabetes and would like testing for diabetes today - check A1c.

## 2016-07-27 NOTE — Patient Instructions (Addendum)
Follow up with your dentist for the tooth issues.  Checking cholesterol, kidneys, liver, HIV, hepatitis C, and for diabetes today. I will call you or send you a letter with your results.  For depression: - increase venlafaxine. You will now take THREE 75mg  pills every day. I sent these in for you. Stop the 150mg  pills.  Follow up with me in 1-2 weeks for depression to be sure you're doing better. If you have any thoughts of hurting yourself or anyone else, go to the Emergency Room to stay safe.   Be well, Dr. Ardelia Mems

## 2016-07-27 NOTE — Progress Notes (Signed)
Date of Visit: 07/27/2016   HPI:  Patient presents to discuss dental pain and depression. She is a patient of Dr. Ree Kida, but I see both of her parents, and she would like to switch to me to have a female provider.  Dental pain - 2 months ago went to the dentist for a routine cleaning. Had xrays before that. Since the cleaning, has had pain in the top of her gums on upper right side, feelings of "electroshocks". Saw the dentist after that, who told her it looked red and swollen and gave her an antibiotic and pain medication. This helped some. However, now the pain is getting worse and going deeper into her mouth, feels it on the bottom now as well. Feels it's painful to talk. Saw the dentist last week and was given more antibiotics (penicillin she thinks). Has follow up appointment with dentist on December 27.  Depression - feels very overwhelmed and lonely. Reports a history of depression and anxiety, for years. Has taken venlafaxine for "a long time". 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 Her siblings live out of town and when they come into town, she feels judged by them, despite doing all she can to help her parents. Feels tired all the time, just wants to sleep. Feels stressed. Muscles ache and fel crampy. Has tingling in her hands. She feels that increasing her medicine would be helpful.   Hypertension - taking lisinopril 20mg  daily, triamterene-HCTZ 37.5-25 mg daily. No issues with these medications. Has not had lipids in several years. Due for renal function. Would like to be tested for diabetes, as her mother has diabetes.   ROS: See HPI.  [place]: history of depression/anx, hypertension, hyperlipidemia, breast cancer  PHYSICAL EXAM: BP 118/76   Pulse 72   Temp 98.2 F (36.8 C) (Oral)   Wt 161 lb (73 kg)   BMI 27.64 kg/m  Gen: NAD, pleasant, cooperative HEENT: normocephalic, atraumatic, moist  mucous membranes. Missing a few teeth. Gums nontender to palpation. No facial pain with palpation. No drooling or trismus. No broken teeth or signs of infection noted. Heart: regular rate and rhythm, no murmur Lungs: clear to auscultation bilaterally normal work of breathing  Neuro: alert grossly nonfocal speech normal Ext: No appreciable lower extremity edema bilaterally   ASSESSMENT/PLAN:  Health maintenance:  -HIV & hep C antibodies drawn today for routine screening -reports she had her flu shot this year -need to discuss pap, Tdap, colonoscopy at a future visit  Depression with anxiety Uncontrolled. No evidence of danger to self or others. Offered therapeutic listening ear and emphasized to patient that she's doing a great job with caring for her parents.  - increase venlafaxine XR to 225mg  daily (will rx three 75mg  tabs) - follow up with me in 1-2 weeks to assess for stability/improvement - offered behavioral health/integrated care consult during today's visit, but patient declined this, said she has too much going on to attend extra visits - return precautions discussed  Hyperlipidemia Hyperlipidemia on problem list, no recent assessment of lipids. Will check lipid panel today. Initiate statin if indicated based on results.  HYPERTENSION, BENIGN ESSENTIAL Well controlled. Continue current regimen. Check lipids & CMET today. Patient has family history of diabetes and would like testing for diabetes today - check A1c.   Dental pain No signs of infection or abscess one xam. Recommended continuing to follow up with dentist. If persists could  consider advanced imaging of face (likely CT maxillofacial) but for now will defer management to dentist.  FOLLOW UP: Follow up in 1-2 weeks for depression.  [place]. Sherry Anderson, [place]

## 2016-07-27 NOTE — Assessment & Plan Note (Signed)
Uncontrolled. No evidence of danger to self or others. Offered therapeutic listening ear and emphasized to patient that she's doing a great job with caring for her parents.  - increase venlafaxine XR to 225mg  daily (will rx three 75mg  tabs) - follow up with me in 1-2 weeks to assess for stability/improvement - offered behavioral health/integrated care consult during today's visit, but patient declined this, said she has too much going on to attend extra visits - return precautions discussed

## 2016-07-28 ENCOUNTER — Encounter: Payer: Self-pay | Admitting: Family Medicine

## 2016-07-28 LAB — COMPLETE METABOLIC PANEL WITH GFR
ALBUMIN: 4.7 g/dL (ref 3.6–5.1)
ALK PHOS: 50 U/L (ref 33–130)
ALT: 24 U/L (ref 6–29)
AST: 24 U/L (ref 10–35)
BUN: 15 mg/dL (ref 7–25)
CALCIUM: 9.8 mg/dL (ref 8.6–10.4)
CO2: 29 mmol/L (ref 20–31)
Chloride: 103 mmol/L (ref 98–110)
Creat: 0.86 mg/dL (ref 0.50–1.05)
GFR, EST AFRICAN AMERICAN: 87 mL/min (ref >=60)
GFR, EST NON AFRICAN AMERICAN: 76 mL/min (ref >=60)
Glucose, Bld: 93 mg/dL (ref 65–99)
POTASSIUM: 4.1 mmol/L (ref 3.5–5.3)
Sodium: 139 mmol/L (ref 135–146)
Total Bilirubin: 0.5 mg/dL (ref 0.2–1.2)
Total Protein: 7.5 g/dL (ref 6.1–8.1)

## 2016-07-28 LAB — LIPID PANEL
CHOL/HDL RATIO: 3.6 ratio (ref <5.0)
Cholesterol: 234 mg/dL — ABNORMAL HIGH (ref <200)
HDL: 65 mg/dL (ref >50)
LDL Cholesterol: 145 mg/dL — ABNORMAL HIGH (ref <100)
TRIGLYCERIDES: 119 mg/dL (ref <150)
VLDL: 24 mg/dL (ref <30)

## 2016-07-28 LAB — HEPATITIS C ANTIBODY: HCV AB: NEGATIVE

## 2016-07-28 LAB — HIV ANTIBODY (ROUTINE TESTING W REFLEX): HIV 1&2 Ab, 4th Generation: NONREACTIVE

## 2016-09-16 ENCOUNTER — Other Ambulatory Visit: Payer: Self-pay | Admitting: Family Medicine

## 2016-09-16 DIAGNOSIS — R52 Pain, unspecified: Secondary | ICD-10-CM

## 2016-09-16 DIAGNOSIS — N644 Mastodynia: Secondary | ICD-10-CM

## 2016-09-18 ENCOUNTER — Other Ambulatory Visit: Payer: Self-pay | Admitting: Family Medicine

## 2016-09-24 ENCOUNTER — Other Ambulatory Visit: Payer: Self-pay | Admitting: Family Medicine

## 2016-09-24 MED ORDER — VENLAFAXINE HCL ER 75 MG PO CP24: 225.0000 mg | ORAL_CAPSULE | Freq: Every day | ORAL | 0 refills | Status: DC

## 2016-09-24 MED ORDER — LISINOPRIL 20 MG PO TABS: 20.0000 mg | ORAL_TABLET | Freq: Every day | ORAL | 3 refills | Status: DC

## 2016-09-24 MED ORDER — OMEPRAZOLE 20 MG PO CPDR: 20.0000 mg | DELAYED_RELEASE_CAPSULE | Freq: Two times a day (BID) | ORAL | 1 refills | Status: DC

## 2016-09-24 MED ORDER — TRIAMTERENE-HCTZ 37.5-25 MG PO CAPS: 1.0000 | ORAL_CAPSULE | Freq: Every day | ORAL | 3 refills | Status: DC

## 2016-09-24 NOTE — Telephone Encounter (Signed)
Please advise patient I will send in refills but she should schedule follow up visit with me After last visit in December she was supposed to see me in 1-2 weeks  Thanks Leeanne Rio, MD

## 2016-09-24 NOTE — Telephone Encounter (Signed)
Need refill omeprazole, HCTZ, lisonpril and venlafaxin,  [facility]

## 2016-10-01 NOTE — Telephone Encounter (Signed)
LM for patient asking her to call back and schedule an appointment. Letesha Klecker,CMA

## 2016-10-23 ENCOUNTER — Other Ambulatory Visit: Payer: Self-pay | Admitting: Family Medicine

## 2016-10-27 ENCOUNTER — Ambulatory Visit (INDEPENDENT_AMBULATORY_CARE_PROVIDER_SITE_OTHER): Payer: Commercial Managed Care - PPO | Admitting: Student

## 2016-10-27 ENCOUNTER — Encounter: Payer: Self-pay | Admitting: Student

## 2016-10-27 VITALS — BP 110/80 | HR 73 | Temp 98.3°F | Wt 155.0 lb

## 2016-10-27 DIAGNOSIS — K2990 Gastroduodenitis, unspecified, without bleeding: Secondary | ICD-10-CM

## 2016-10-27 DIAGNOSIS — K2970 Gastritis, unspecified, without bleeding: Secondary | ICD-10-CM

## 2016-10-27 LAB — POCT H PYLORI SCREEN: H Pylori Screen, POC: POSITIVE

## 2016-10-27 MED ORDER — CALCIUM CARBONATE-VITAMIN D 600-400 MG-UNIT PO TABS: 1.0000 | ORAL_TABLET | Freq: Two times a day (BID) | ORAL | 2 refills | Status: DC

## 2016-10-27 MED ORDER — OMEPRAZOLE 40 MG PO CPDR: 40.0000 mg | DELAYED_RELEASE_CAPSULE | Freq: Two times a day (BID) | ORAL | 3 refills | Status: DC

## 2016-10-27 MED ORDER — VENLAFAXINE HCL ER 75 MG PO CP24: 225.0000 mg | ORAL_CAPSULE | Freq: Every day | ORAL | 0 refills | Status: DC

## 2016-10-27 MED ORDER — TRIAMTERENE-HCTZ 37.5-25 MG PO CAPS: 1.0000 | ORAL_CAPSULE | Freq: Every day | ORAL | 3 refills | Status: DC

## 2016-10-27 MED ORDER — CLARITHROMYCIN 500 MG PO TABS: 500.0000 mg | ORAL_TABLET | Freq: Two times a day (BID) | ORAL | 0 refills | Status: DC

## 2016-10-27 MED ORDER — AMOXICILLIN 500 MG PO CAPS: 1000.0000 mg | ORAL_CAPSULE | Freq: Two times a day (BID) | ORAL | 0 refills | Status: DC

## 2016-10-27 MED ORDER — LISINOPRIL 20 MG PO TABS: 20.0000 mg | ORAL_TABLET | Freq: Every day | ORAL | 3 refills | Status: DC

## 2016-10-27 MED ORDER — SUCRALFATE 1 GM/10ML PO SUSP: 1.0000 g | Freq: Three times a day (TID) | ORAL | 0 refills | Status: DC

## 2016-10-27 NOTE — Assessment & Plan Note (Addendum)
History and physical exam consistent with gastritis. Given she does have a history of H pylori s/p treatment in 2016 will retest for eradication - h pylori testing positive, will treat with 10 days of amox and clarithromycin with omeprazole - will send to GI for further work up and imaging as needed

## 2016-10-27 NOTE — Progress Notes (Signed)
   Subjective:    Patient ID: Sherry Anderson, female    DOB: 29-Jul-1961, 56 y.o.   MRN: [id number]   CC: stomach pain  HPI: 56 y/o F with a PMH of H pylori gastritis presents for stomach pain yesterday that is now resolved  Stomach pain - pain was in her stomach at the same location where she typically gets stomach pain but she felt it was more severe - it self resolved without taking medication - she has similar stomach pain frequently, she is unsure of it is associated with food - she denies N/V  Smoking status reviewed  Review of Systems  Per HPI, else denies recent illness, fever, chest pain, shortness of breath    Objective:  BP 110/80   Pulse 73   Temp 98.3 F (36.8 C) (Oral)   Wt 155 lb (70.3 kg)   SpO2 99%   BMI 26.61 kg/m  Vitals and nursing note reviewed  General: NAD Cardiac: RRR,  Respiratory: CTAB, normal effort Abdomen: soft, nontender, nondistended,  Neuro: alert and oriented, no focal deficits   Assessment & Plan:    Gastritis and gastroduodenitis History and physical exam consistent with gastritis. Given she does have a history of H pylori s/p treatment in 2016 will retest for eradication - h pylori testing positive, will treat with 10 days of amox and clarithromycin with omeprazole - will send to GI for further work up and imaging as needed     Cayman Brogden A. Lincoln Brigham MD, [facility] Resident PGY-3 Pager [phone]

## 2016-10-27 NOTE — Patient Instructions (Signed)
Follow with your PCP as needed You will need to follow with GI, a referral was made for you and someone will call you for an appointment If you  Have any questions or concerns, call the office at [phone]

## 2016-11-05 ENCOUNTER — Encounter: Payer: Self-pay | Admitting: Nurse Practitioner

## 2016-11-05 ENCOUNTER — Ambulatory Visit (INDEPENDENT_AMBULATORY_CARE_PROVIDER_SITE_OTHER): Payer: Commercial Managed Care - PPO | Admitting: Nurse Practitioner

## 2016-11-05 VITALS — BP 106/76 | HR 78 | Ht 63.25 in | Wt 156.4 lb

## 2016-11-05 DIAGNOSIS — R1013 Epigastric pain: Secondary | ICD-10-CM

## 2016-11-05 DIAGNOSIS — A048 Other specified bacterial intestinal infections: Secondary | ICD-10-CM

## 2016-11-05 NOTE — Patient Instructions (Signed)
You have been scheduled for an endoscopy. Please follow written instructions given to you at your visit today. If you use inhalers (even only as needed), please bring them with you on the day of your procedure. Your physician has requested that you go to www.startemmi.com and enter the access code given to you at your visit today. This web site gives a general overview about your procedure. However, you should still follow specific instructions given to you by our office regarding your preparation for the procedure.  Please continue your Prilosec twice daily.  Thank you.

## 2016-11-05 NOTE — Progress Notes (Signed)
     HPI: Patient is a 56 yo female referred by PCP Dr. Marina Goodell for evaluation of epigastric pain. Patient has a hx of breast cancer in 2011. She has surgery, radiation and Tamoxifen. Patient began having epigastric pain radiating through to her back about 4 months ago. Pain unrelated to meals or activity. Episodes were severe and could last for hours at a time. No NSAID use. Patient saw PCP, H pylori screen was positive. Patient was treated with amoxicillin and clarithromycin. She had already been on a PPI for years but this was increased to twice a day. Patient has 2 days of antibiotics left. Her pain has significantly improved. She still gets daily episodes of the pain though the episodes only last for 15-20 minutes. She has some nausea but that has improved as well. No lower GI complaints. BM are normal, no blood. Patient still uncomfortable with the residual epigastric pain. She is especially concerned given hx of breast cancer.    Past Medical History:  Diagnosis Date  . Absence of menstruation   . Breast cancer ([place]) 2002  . Carpal tunnel syndrome   . Depressive disorder, not elsewhere classified   . Essential hypertension, benign   . Headache(784.0)   . Lateral epicondylitis  of elbow   . Myalgia and myositis, unspecified   . Other and unspecified hyperlipidemia   . Other malaise and fatigue   . Other specified gastritis without mention of hemorrhage   . Unspecified hearing loss    right ear    Patient's surgical history, family medical history, social history, medications and allergies were all reviewed in Epic    Physical Exam: BP 106/76   Pulse 78   Ht 5' 3.25" (1.607 m)   Wt 156 lb 6 oz (70.9 kg)   BMI 27.48 kg/m   GENERAL: pleasant female in NAD PSYCH: :Pleasant, cooperative, normal affect HEENT: Normocephalic, conjunctiva pink, mucous membranes moist, neck supple without masses CARDIAC:  RRR, no murmur heard, no peripheral edema PULM: Normal respiratory  effort, lungs CTA bilaterally, no wheezing ABDOMEN:  soft, nontender, nondistended, no obvious masses, no hepatomegaly,  normal bowel sounds SKIN:  turgor, no lesions seen Musculoskeletal:  Normal muscle tone, normal strength NEURO: Alert and oriented x 3, no focal neurologic deficits   ASSESSMENT and PLAN:  30. 56 year old female with several month history of epigastric pain radiating through to the back. Recent H.pylori test positive. She has two more days of amox / biaxin. Has been on a PPI for years. Epigastric pain has improved but still having daily pain, the episodes just don't last as long as they used to. She has had recent unintended weight loss of 5 pounds.  -complete antibiotics -continue BID PPI -for further evaluation she will be scheduled for an EGD. The risks and benefits of EGD were discussed and the patient agrees to proceed. At time of EGD with biopsy to check for H. pylori eradication-If EGD is unremarkable, consider ultrasound or other abdominal imaging.  2. Colon cancer screening. She needs this at some point but will wait until resolution of #1  3. History of breast cancer 2011  Tye Savoy , NP 11/05/2016, 9:27 AM   Cc:  Marina Goodell, MD

## 2016-11-11 ENCOUNTER — Encounter: Payer: Self-pay | Admitting: Gastroenterology

## 2016-11-11 NOTE — Progress Notes (Signed)
Reviewed and agree with documentation and assessment and plan. K. Veena Dajohn Ellender , MD   

## 2016-11-22 ENCOUNTER — Other Ambulatory Visit: Payer: Self-pay | Admitting: *Deleted

## 2016-11-22 MED ORDER — VENLAFAXINE HCL ER 75 MG PO CP24: 225.0000 mg | ORAL_CAPSULE | Freq: Every day | ORAL | 0 refills | Status: DC

## 2016-11-24 ENCOUNTER — Encounter: Payer: Self-pay | Admitting: Gastroenterology

## 2016-11-24 ENCOUNTER — Ambulatory Visit (AMBULATORY_SURGERY_CENTER): Payer: Commercial Managed Care - PPO | Admitting: Gastroenterology

## 2016-11-24 VITALS — BP 91/48 | HR 68 | Temp 98.4°F | Resp 11 | Ht 60.0 in | Wt 156.0 lb

## 2016-11-24 DIAGNOSIS — R1013 Epigastric pain: Secondary | ICD-10-CM | POA: Diagnosis present

## 2016-11-24 DIAGNOSIS — K2970 Gastritis, unspecified, without bleeding: Secondary | ICD-10-CM | POA: Diagnosis not present

## 2016-11-24 MED ORDER — SODIUM CHLORIDE 0.9 % IV SOLN: 500.0000 mL | INTRAVENOUS | Status: DC

## 2016-11-24 NOTE — Op Note (Signed)
[facility] Patient Name: Sherry Anderson Procedure Date: 11/24/2016 1:30 PM MRN: [id number] Endoscopist: Mauri Pole , MD Age: 56 Referring MD:  Date of Birth: 1960/12/10 Gender: Female Account #: [id number] Procedure:                Upper GI endoscopy Indications:              Epigastric abdominal pain Medicines:                Monitored Anesthesia Care Procedure:                Pre-Anesthesia Assessment:                           - Prior to the procedure, a History and Physical                            was performed, and patient medications and                            allergies were reviewed. The patient's tolerance of                            previous anesthesia was also reviewed. The risks                            and benefits of the procedure and the sedation                            options and risks were discussed with the patient.                            All questions were answered, and informed consent                            was obtained. Prior Anticoagulants: The patient has                            taken no previous anticoagulant or antiplatelet                            agents. ASA Grade Assessment: II - A patient with                            mild systemic disease. After reviewing the risks                            and benefits, the patient was deemed in                            satisfactory condition to undergo the procedure.                           After obtaining informed consent, the endoscope was  passed under direct vision. Throughout the                            procedure, the patient's blood pressure, pulse, and                            oxygen saturations were monitored continuously. The                            Endoscope was introduced through the mouth, and                            advanced to the second part of duodenum. The upper                            GI endoscopy was  somewhat difficult due to the                            patient's oxygen desaturation. Successful                            completion of the procedure was aided by performing                            chin lift, administering oxygen and treating with                            ventilation. The patient tolerated the procedure                            well. Scope In: Scope Out: Findings:                 The Z-line was regular and was found 30 cm from the                            incisors.                           A 5 cm hiatal hernia was present.                           Patchy mild inflammation characterized by                            congestion (edema) and erythema was found in the                            entire examined stomach. Biopsies were taken with a                            cold forceps for Helicobacter pylori testing.                           The examined duodenum was normal. Complications:  No immediate complications. Estimated Blood Loss:     Estimated blood loss was minimal. Impression:               - Z-line regular, 30 cm from the incisors.                           - 5 cm hiatal hernia.                           - Gastritis. Biopsied.                           - Normal examined duodenum. Recommendation:           - Patient has a contact number available for                            emergencies. The signs and symptoms of potential                            delayed complications were discussed with the                            patient. Return to normal activities tomorrow.                            Written discharge instructions were provided to the                            patient.                           - Resume previous diet.                           - Continue present medications.                           - Await pathology results.                           - No repeat upper endoscopy.                           - Return to GI  office PRN. Mauri Pole, MD 11/24/2016 1:55:42 PM This report has been signed electronically.

## 2016-11-24 NOTE — Progress Notes (Signed)
Pt begins coughing, SaO2 decreasing, 100% O2 via BVM assist with prompt return of SaO2 to normal values. OPA placed and jaw thrust briefly to maximize airflow.  Suctioned as well. Awake at end. To recovery, report to [employer], RN, VSS

## 2016-11-24 NOTE — Patient Instructions (Signed)
Discharge instructions given. Handout on Gastritis and a hiatal hernia. Resume previous medications. YOU HAD AN ENDOSCOPIC PROCEDURE TODAY AT [facility]:   Refer to the procedure report that was given to you for any specific questions about what was found during the examination.  If the procedure report does not answer your questions, please call your gastroenterologist to clarify.  If you requested that your care partner not be given the details of your procedure findings, then the procedure report has been included in a sealed envelope for you to review at your convenience later.  YOU SHOULD EXPECT: Some feelings of bloating in the abdomen. Passage of more gas than usual.  Walking can help get rid of the air that was put into your GI tract during the procedure and reduce the bloating. If you had a lower endoscopy (such as a colonoscopy or flexible sigmoidoscopy) you may notice spotting of blood in your stool or on the toilet paper. If you underwent a bowel prep for your procedure, you may not have a normal bowel movement for a few days.  Please Note:  You might notice some irritation and congestion in your nose or some drainage.  This is from the oxygen used during your procedure.  There is no need for concern and it should clear up in a day or so.  SYMPTOMS TO REPORT IMMEDIATELY:    Following upper endoscopy (EGD)  Vomiting of blood or coffee ground material  New chest pain or pain under the shoulder blades  Painful or persistently difficult swallowing  New shortness of breath  Fever of 100F or higher  Black, tarry-looking stools  For urgent or emergent issues, a gastroenterologist can be reached at any hour by calling [phone].   DIET:  We do recommend a small meal at first, but then you may proceed to your regular diet.  Drink plenty of fluids but you should avoid alcoholic beverages for 24 hours.  ACTIVITY:  You should plan to take it easy for the rest of  today and you should NOT DRIVE or use heavy machinery until tomorrow (because of the sedation medicines used during the test).    FOLLOW UP: Our staff will call the number listed on your records the next business day following your procedure to check on you and address any questions or concerns that you may have regarding the information given to you following your procedure. If we do not reach you, we will leave a message.  However, if you are feeling well and you are not experiencing any problems, there is no need to return our call.  We will assume that you have returned to your regular daily activities without incident.  If any biopsies were taken you will be contacted by phone or by letter within the next 1-3 weeks.  Please call us at [phone] if you have not heard about the biopsies in 3 weeks.    SIGNATURES/CONFIDENTIALITY: You and/or your care partner have signed paperwork which will be entered into your electronic medical record.  These signatures attest to the fact that that the information above on your After Visit Summary has been reviewed and is understood.  Full responsibility of the confidentiality of this discharge information lies with you and/or your care-partner.

## 2016-11-24 NOTE — Progress Notes (Signed)
Called to room to assist during endoscopic procedure.  Patient ID and intended procedure confirmed with present staff. Received instructions for my participation in the procedure from the performing physician.  

## 2016-11-25 ENCOUNTER — Telehealth: Payer: Self-pay

## 2016-11-25 NOTE — Telephone Encounter (Signed)
  Follow up Call-  Call back number 11/24/2016 11/22/2014  Post procedure Call Back phone  # [phone]  Permission to leave phone message Yes Yes  Some recent data might be hidden     Patient questions:  Do you have a fever, pain , or abdominal swelling? No. Pain Score  0 *  Have you tolerated food without any problems? Yes.    Have you been able to return to your normal activities? Yes.    Do you have any questions about your discharge instructions: Diet   No. Medications  No. Follow up visit  No.  Do you have questions or concerns about your Care? No.  Actions: * If pain score is 4 or above: No action needed, pain <4.

## 2016-12-06 ENCOUNTER — Encounter: Payer: Self-pay | Admitting: Gastroenterology

## 2016-12-13 ENCOUNTER — Ambulatory Visit: Payer: Commercial Managed Care - PPO | Admitting: Family Medicine

## 2017-01-08 ENCOUNTER — Other Ambulatory Visit: Payer: Self-pay | Admitting: Family Medicine

## 2017-01-11 ENCOUNTER — Other Ambulatory Visit: Payer: Self-pay | Admitting: Family Medicine

## 2017-01-11 ENCOUNTER — Encounter: Payer: Self-pay | Admitting: Gastroenterology

## 2017-01-11 ENCOUNTER — Ambulatory Visit (INDEPENDENT_AMBULATORY_CARE_PROVIDER_SITE_OTHER): Payer: Commercial Managed Care - PPO | Admitting: Family Medicine

## 2017-01-11 ENCOUNTER — Encounter: Payer: Self-pay | Admitting: Family Medicine

## 2017-01-11 VITALS — BP 138/82 | HR 68 | Temp 98.1°F | Wt 158.0 lb

## 2017-01-11 DIAGNOSIS — Z1231 Encounter for screening mammogram for malignant neoplasm of breast: Secondary | ICD-10-CM

## 2017-01-11 DIAGNOSIS — F418 Other specified anxiety disorders: Secondary | ICD-10-CM

## 2017-01-11 DIAGNOSIS — K1379 Other lesions of oral mucosa: Secondary | ICD-10-CM | POA: Diagnosis not present

## 2017-01-11 DIAGNOSIS — Z23 Encounter for immunization: Secondary | ICD-10-CM

## 2017-01-11 DIAGNOSIS — M5440 Lumbago with sciatica, unspecified side: Secondary | ICD-10-CM | POA: Diagnosis not present

## 2017-01-11 MED ORDER — BUPROPION HCL ER (XL) 150 MG PO TB24
150.0000 mg | ORAL_TABLET | Freq: Every day | ORAL | 0 refills | Status: DC
Start: 2017-01-11 — End: 2017-02-07

## 2017-01-11 MED ORDER — BACLOFEN 10 MG PO TABS: 5.0000 mg | ORAL_TABLET | Freq: Three times a day (TID) | ORAL | 0 refills | Status: DC | PRN

## 2017-01-11 NOTE — Progress Notes (Signed)
Date of Visit: 01/11/2017   HPI:  Mouth pain - has had some chronic pain in her mouth. Has seen 2 different dentists, and then an oral surgeon. None of them has found an explanation for her pain. Was started on gabapentin several days ago, with thoughts that this might be due to a nerve being irritated. Gabapentin has helped some but just a little bit.  Depression/anxiety - feels nervous often. No SI/HI. Worries about her parents, who are elderly and demented. Taking effexor 225mg  daily and tolerating this well. Does not feel that symptoms are very well controlled.  Back pain - works 12 hours a day. Gets tingling/numbness down right side sometimes, ongoing for a while. Denies having fever, saddle anesthesia, lower extremity weakness, or problems with stooling or urination. Has not tried a muscle relaxer.  ROS: See HPI.  [place]: history of carcinoma in situ of breast, hyperlipidemia, depression/anxiety, hypertension  PHYSICAL EXAM: BP 138/82   Pulse 68   Temp 98.1 F (36.7 C) (Oral)   Wt 158 lb (71.7 kg)   BMI 30.86 kg/m  Gen: no acute distress, pleasant, cooperative HEENT: normocephalic, atraumatic, moist mucous membranes. Normal dentition. No oral lesions or obvious gum disease. Heart: regular rate and rhythm, no murmur Lungs: clear to auscultation bilaterally, normal work of breathing  Neuro: alert, grossly nonfocal, speech normal Ext: full strenght bilateral lower extremities. No appreciable lower extremity edema bilaterally. Sensation intact over bilateral lower extremities Back: paraspinal muscles mildly tender to palpation over low back  ASSESSMENT/PLAN:  Health maintenance:  -tdap given today -advised patient to schedule appointment for pap smear -given colonoscopy handout on how to schedule  Back pain: No red flags, chronic. I am hopeful that getting better control of her mood/stress will help her physical symptoms as I suspect this is the major contributor Add baclofen  for muscle spasm relief  Depression/anxiety Uncontrolled. No SI/HI Continue venlafaxine Add wellbutrin Follow up in a few weeks to evaluate for safety and tolerability.  Mouth pain Unclear. Wonder if possible trigeminal neuralgia? Being managed by dentist/oral surgeon Will give gabapentin more time to work, follow up with dentistry. Consider uptitrating gabapentin, plenty of room to go in dosing. May help somatic symptoms as well.  FOLLOW UP: Follow up in a few weeks for depression/anxiety  [place] J. Ardelia Mems, [place]

## 2017-01-11 NOTE — Patient Instructions (Signed)
For mood: -add wellbutrin 150mg  daily. Sent this in -continue effexor  For back: -try baclofen 5mg  three times daily as needed. Use caution as it might make you sleepy  Health maintenance: -recommended tetanus vaccine today -schedule pap smear here -See the handout on how to schedule your colonoscopy. This is an important screening test for colon cancer.   For mouth: -continue gabapentin as prescribed by the oral surgeon -if you tolerate it we could go up on the dose  Follow up in a few weeks for your mood.  Be well, Dr. Ardelia Mems

## 2017-01-14 ENCOUNTER — Ambulatory Visit: Payer: Commercial Managed Care - PPO

## 2017-01-28 ENCOUNTER — Ambulatory Visit
Admission: RE | Admit: 2017-01-28 | Discharge: 2017-01-28 | Disposition: A | Discharge status: Home / Self Care | Payer: Commercial Managed Care - PPO | Source: Ambulatory Visit | Attending: Family Medicine | Admitting: Family Medicine

## 2017-01-28 ENCOUNTER — Encounter: Payer: Self-pay | Admitting: Gastroenterology

## 2017-01-28 ENCOUNTER — Ambulatory Visit (AMBULATORY_SURGERY_CENTER): Payer: Self-pay

## 2017-01-28 VITALS — Ht 64.0 in | Wt 158.0 lb

## 2017-01-28 DIAGNOSIS — Z1211 Encounter for screening for malignant neoplasm of colon: Secondary | ICD-10-CM

## 2017-01-28 DIAGNOSIS — Z1231 Encounter for screening mammogram for malignant neoplasm of breast: Secondary | ICD-10-CM

## 2017-01-28 MED ORDER — NA SULFATE-K SULFATE-MG SULF 17.5-3.13-1.6 GM/177ML PO SOLN: 1.0000 | Freq: Once | ORAL | 0 refills | Status: AC

## 2017-01-28 NOTE — Progress Notes (Signed)
Denies allergies to eggs or soy products. Denies complication of anesthesia or sedation. Denies use of weight loss medication. Denies use of O2.   Emmi instructions declined. Patient does not have email.

## 2017-02-02 ENCOUNTER — Other Ambulatory Visit: Payer: Self-pay | Admitting: Family Medicine

## 2017-02-07 ENCOUNTER — Other Ambulatory Visit: Payer: Self-pay | Admitting: Family Medicine

## 2017-02-08 ENCOUNTER — Ambulatory Visit (AMBULATORY_SURGERY_CENTER): Payer: Commercial Managed Care - PPO | Admitting: Gastroenterology

## 2017-02-08 ENCOUNTER — Encounter: Payer: Self-pay | Admitting: Gastroenterology

## 2017-02-08 VITALS — BP 150/85 | HR 54 | Temp 97.5°F | Resp 12 | Ht 64.0 in | Wt 158.0 lb

## 2017-02-08 DIAGNOSIS — Z1211 Encounter for screening for malignant neoplasm of colon: Secondary | ICD-10-CM

## 2017-02-08 DIAGNOSIS — D128 Benign neoplasm of rectum: Secondary | ICD-10-CM | POA: Diagnosis not present

## 2017-02-08 DIAGNOSIS — Z1212 Encounter for screening for malignant neoplasm of rectum: Secondary | ICD-10-CM | POA: Diagnosis not present

## 2017-02-08 DIAGNOSIS — D120 Benign neoplasm of cecum: Secondary | ICD-10-CM | POA: Diagnosis not present

## 2017-02-08 DIAGNOSIS — K621 Rectal polyp: Secondary | ICD-10-CM

## 2017-02-08 MED ORDER — SODIUM CHLORIDE 0.9 % IV SOLN: 500.0000 mL | INTRAVENOUS | Status: DC

## 2017-02-08 NOTE — Patient Instructions (Signed)
YOU HAD AN ENDOSCOPIC PROCEDURE TODAY AT [facility]:   Refer to the procedure report that was given to you for any specific questions about what was found during the examination.  If the procedure report does not answer your questions, please call your gastroenterologist to clarify.  If you requested that your care partner not be given the details of your procedure findings, then the procedure report has been included in a sealed envelope for you to review at your convenience later.  YOU SHOULD EXPECT: Some feelings of bloating in the abdomen. Passage of more gas than usual.  Walking can help get rid of the air that was put into your GI tract during the procedure and reduce the bloating. If you had a lower endoscopy (such as a colonoscopy or flexible sigmoidoscopy) you may notice spotting of blood in your stool or on the toilet paper. If you underwent a bowel prep for your procedure, you may not have a normal bowel movement for a few days.  Please Note:  You might notice some irritation and congestion in your nose or some drainage.  This is from the oxygen used during your procedure.  There is no need for concern and it should clear up in a day or so.  SYMPTOMS TO REPORT IMMEDIATELY:   Following lower endoscopy (colonoscopy or flexible sigmoidoscopy):  Excessive amounts of blood in the stool  Significant tenderness or worsening of abdominal pains  Swelling of the abdomen that is new, acute  Fever of 100F or higher    For urgent or emergent issues, a gastroenterologist can be reached at any hour by calling [phone].   DIET:  We do recommend a small meal at first, but then you may proceed to your regular diet.  Drink plenty of fluids but you should avoid alcoholic beverages for 24 hours.  ACTIVITY:  You should plan to take it easy for the rest of today and you should NOT DRIVE or use heavy machinery until tomorrow (because of the sedation medicines used during the test).     FOLLOW UP: Our staff will call the number listed on your records the next business day following your procedure to check on you and address any questions or concerns that you may have regarding the information given to you following your procedure. If we do not reach you, we will leave a message.  However, if you are feeling well and you are not experiencing any problems, there is no need to return our call.  We will assume that you have returned to your regular daily activities without incident.  If any biopsies were taken you will be contacted by phone or by letter within the next 1-3 weeks.  Please call us at [phone] if you have not heard about the biopsies in 3 weeks.    SIGNATURES/CONFIDENTIALITY: You and/or your care partner have signed paperwork which will be entered into your electronic medical record.  These signatures attest to the fact that that the information above on your After Visit Summary has been reviewed and is understood.  Full responsibility of the confidentiality of this discharge information lies with you and/or your care-partner.   AWAIT PATHOLOGY RESULTS INFORMATION ON POLYPS,DIVERTICULOSIS,HEMORRHOIDS ,AND HIGH FIBER DIET

## 2017-02-08 NOTE — Progress Notes (Signed)
Spontaneous respirations throughout. VSS. Resting comfortably. To PACU on room air. Report to  Penny RN.  

## 2017-02-08 NOTE — Op Note (Signed)
[facility] Patient Name: Sherry Anderson Procedure Date: 02/08/2017 1:59 PM MRN: [id number] Endoscopist: Mauri Pole , MD Age: 56 Referring MD:  Date of Birth: [birth year] Gender: Female Account #: [id number] Procedure:                Colonoscopy Indications:              Screening for colorectal malignant neoplasm Medicines:                Monitored Anesthesia Care Procedure:                Pre-Anesthesia Assessment:                           - Prior to the procedure, a History and Physical                            was performed, and patient medications and                            allergies were reviewed. The patient's tolerance of                            previous anesthesia was also reviewed. The risks                            and benefits of the procedure and the sedation                            options and risks were discussed with the patient.                            All questions were answered, and informed consent                            was obtained. Prior Anticoagulants: The patient has                            taken no previous anticoagulant or antiplatelet                            agents. ASA Grade Assessment: II - A patient with                            mild systemic disease. After reviewing the risks                            and benefits, the patient was deemed in                            satisfactory condition to undergo the procedure.                           After obtaining informed consent, the colonoscope  was passed under direct vision. Throughout the                            procedure, the patient's blood pressure, pulse, and                            oxygen saturations were monitored continuously. The                            Model PCF-H190DL [id number]) scope was introduced                            through the anus and advanced to the the cecum,   identified by appendiceal orifice and ileocecal                            valve. The colonoscopy was performed without                            difficulty. The patient tolerated the procedure                            well. The quality of the bowel preparation was                            excellent. The ileocecal valve, appendiceal                            orifice, and rectum were photographed. Scope In: 2:04:06 PM Scope Out: 2:24:26 PM Scope Withdrawal Time: 0 hours 15 minutes 41 seconds  Total Procedure Duration: 0 hours 20 minutes 20 seconds  Findings:                 The perianal and digital rectal examinations were                            normal.                           Two sessile polyps were found in the cecum. The                            polyps were 1 to 2 mm in size. These polyps were                            removed with a cold biopsy forceps. Resection and                            retrieval were complete.                           Two sessile polyps were found in the rectum and                            cecum. The polyps were  5 to 8 mm in size. These                            polyps were removed with a cold snare. Resection                            and retrieval were complete.                           A few small-mouthed diverticula were found in the                            sigmoid colon, descending colon and ascending colon.                           Non-bleeding internal hemorrhoids were found during                            retroflexion. The hemorrhoids were small.                           The exam was otherwise without abnormality. Complications:            No immediate complications. Estimated Blood Loss:     Estimated blood loss was minimal. Impression:               - Two 1 to 2 mm polyps in the cecum, removed with a                            cold biopsy forceps. Resected and retrieved.                           - Two 5 to 8 mm polyps in  the rectum and in the                            cecum, removed with a cold snare. Resected and                            retrieved.                           - Diverticulosis in the sigmoid colon, in the                            descending colon and in the ascending colon.                           - Non-bleeding internal hemorrhoids.                           - The examination was otherwise normal. Recommendation:           - Patient has a contact number available for  emergencies. The signs and symptoms of potential                            delayed complications were discussed with the                            patient. Return to normal activities tomorrow.                            Written discharge instructions were provided to the                            patient.                           - Resume previous diet.                           - Continue present medications.                           - Await pathology results.                           - Repeat colonoscopy in 3 - 5 years for                            surveillance based on pathology results. Mauri Pole, MD 02/08/2017 2:29:11 PM This report has been signed electronically.

## 2017-02-08 NOTE — Progress Notes (Signed)
Pt's states no medical or surgical changes since previsit or office visit. maw 

## 2017-02-08 NOTE — Progress Notes (Signed)
Called to room to assist during endoscopic procedure.  Patient ID and intended procedure confirmed with present staff. Received instructions for my participation in the procedure from the performing physician.  

## 2017-02-09 ENCOUNTER — Telehealth: Payer: Self-pay

## 2017-02-09 ENCOUNTER — Other Ambulatory Visit: Payer: Self-pay | Admitting: Family Medicine

## 2017-02-09 NOTE — Telephone Encounter (Signed)
Attempted to reach patient patient for post-procedure f/u call. No answer. Left message that we will attempt to reach her again later this afternoon and for her to please call us if she has any questions/concerns about her care.

## 2017-02-09 NOTE — Telephone Encounter (Signed)
  Follow up Call-  Call back number 02/08/2017 11/24/2016 11/22/2014  Post procedure Call Back phone  # [phone] cell [phone]  Permission to leave phone message Yes Yes Yes  Some recent data might be hidden     Patient questions:  Do you have a fever, pain , or abdominal swelling? No. Pain Score  0 *  Have you tolerated food without any problems? Yes.    Have you been able to return to your normal activities? Yes.    Do you have any questions about your discharge instructions: Diet   No. Medications  No. Follow up visit  No.  Do you have questions or concerns about your Care? No.  Actions: * If pain score is 4 or above: No action needed, pain <4.

## 2017-02-10 ENCOUNTER — Other Ambulatory Visit: Payer: Self-pay | Admitting: Family Medicine

## 2017-02-10 MED ORDER — LISINOPRIL 20 MG PO TABS: 20.0000 mg | ORAL_TABLET | Freq: Every day | ORAL | 3 refills | Status: DC

## 2017-02-10 MED ORDER — OMEPRAZOLE 20 MG PO CPDR: 20.0000 mg | DELAYED_RELEASE_CAPSULE | Freq: Two times a day (BID) | ORAL | 3 refills | Status: DC

## 2017-02-10 MED ORDER — TRIAMTERENE-HCTZ 37.5-25 MG PO CAPS: 1.0000 | ORAL_CAPSULE | Freq: Every day | ORAL | 3 refills | Status: DC

## 2017-02-10 MED ORDER — VENLAFAXINE HCL ER 75 MG PO CP24: 225.0000 mg | ORAL_CAPSULE | Freq: Every day | ORAL | 0 refills | Status: DC

## 2017-02-13 ENCOUNTER — Other Ambulatory Visit: Payer: Self-pay | Admitting: Family Medicine

## 2017-02-14 ENCOUNTER — Encounter: Payer: Self-pay | Admitting: Gastroenterology

## 2017-02-18 ENCOUNTER — Other Ambulatory Visit: Payer: Self-pay | Admitting: Family Medicine

## 2017-02-18 MED ORDER — VENLAFAXINE HCL ER 75 MG PO CP24: 225.0000 mg | ORAL_CAPSULE | Freq: Every day | ORAL | 0 refills | Status: DC

## 2017-02-18 NOTE — Telephone Encounter (Signed)
Called patient, no answer, mailbox full

## 2017-02-18 NOTE — Telephone Encounter (Signed)
Her Effexor is thru [facility] and will not get here until July 16.  She is wanting to get enough to get her to July 16. She has been without it for 3 days already and really is feeling bad.  She would to have the dr call in enough pills to [facility] at [facility] to last her until July 16.Please advise

## 2017-02-18 NOTE — Telephone Encounter (Signed)
rx sent for 10 days worth to [facility]. Please advise patient.  Thanks Leeanne Rio, MD

## 2017-03-01 ENCOUNTER — Other Ambulatory Visit: Payer: Self-pay | Admitting: Family Medicine

## 2017-03-02 ENCOUNTER — Ambulatory Visit (INDEPENDENT_AMBULATORY_CARE_PROVIDER_SITE_OTHER): Payer: Commercial Managed Care - PPO | Admitting: Family Medicine

## 2017-03-02 DIAGNOSIS — R51 Headache: Secondary | ICD-10-CM

## 2017-03-02 DIAGNOSIS — R519 Headache, unspecified: Secondary | ICD-10-CM | POA: Insufficient documentation

## 2017-03-02 MED ORDER — TROLAMINE SALICYLATE 10 % EX CREA: 1.0000 "application " | TOPICAL_CREAM | CUTANEOUS | 0 refills | Status: DC | PRN

## 2017-03-02 NOTE — Patient Instructions (Addendum)
Good to see you today!  Thanks for coming in.  I would come back to see Dr Ardelia Mems  Aspercreme four times a day on the tender area  If the pain becomes very severe or if you have visual problems you should go to the ER

## 2017-03-02 NOTE — Assessment & Plan Note (Signed)
Acute on Chronic.  Seems to be most consistent with nerve pain given negative dental work up but could not tolerate gabapentin.  Might be worsened by depression.  Try topical aspercreme but could try other antidepressants at follow up if needed.  No signs of cancer but may need imaging depending on persistence

## 2017-03-02 NOTE — Progress Notes (Signed)
Subjective  Patient is presenting with the following illnesses  R Face Pain Has been having for months to years.  Has seen several dentists.  Tried gabapentin but made her groggy.  Seems worse to her this AM but can not say any specific changes except the pain which seems to go from midface up to temple on the R.  No fever, no shortness of breath, No ear discharge or loss of hearing.  Seen for similar pain last visit 5/29   Chief Complaint noted Review of Symptoms - see HPI PMH - Smoking status noted.     Objective Vital Signs reviewed mildly tearful at first but seems to improve during exam Ears:  External ear exam shows no significant lesions or deformities.  Otoscopic examination reveals clear canals, tympanic membranes are intact bilaterally without bulging, retraction, inflammation or discharge. Hearing is grossly normal bilaterall Mouth - no lesions, mucous membranes are moist, no decaying teeth   Lungs:  Normal respiratory effort, chest expands symmetrically. Lungs are clear to auscultation, no crackles or wheezes. Neck:  No deformities, thyromegaly, masses, or focal tenderness noted.   Supple with full range of motion without pain. Skin:  Intact without suspicious lesions or rashes    Assessments/Plans  No problem-specific Assessment & Plan notes found for this encounter.   See Encounter view if individual problem A/Ps not visible See after visit summary for details of patient instuctions

## 2017-03-04 ENCOUNTER — Ambulatory Visit (INDEPENDENT_AMBULATORY_CARE_PROVIDER_SITE_OTHER): Payer: Commercial Managed Care - PPO | Admitting: Family Medicine

## 2017-03-04 ENCOUNTER — Encounter: Payer: Self-pay | Admitting: Family Medicine

## 2017-03-04 VITALS — BP 112/74 | HR 69 | Temp 98.5°F | Ht 64.0 in | Wt 156.2 lb

## 2017-03-04 DIAGNOSIS — F418 Other specified anxiety disorders: Secondary | ICD-10-CM | POA: Diagnosis not present

## 2017-03-04 DIAGNOSIS — R6884 Jaw pain: Secondary | ICD-10-CM

## 2017-03-04 DIAGNOSIS — R51 Headache: Secondary | ICD-10-CM

## 2017-03-04 DIAGNOSIS — R519 Headache, unspecified: Secondary | ICD-10-CM

## 2017-03-04 DIAGNOSIS — M25562 Pain in left knee: Secondary | ICD-10-CM

## 2017-03-04 DIAGNOSIS — M25561 Pain in right knee: Secondary | ICD-10-CM

## 2017-03-04 DIAGNOSIS — G8929 Other chronic pain: Secondary | ICD-10-CM | POA: Diagnosis not present

## 2017-03-04 NOTE — Assessment & Plan Note (Signed)
Unclear etiology, but persists. It seems that dentistry has done all the workup they plan to do. She has not had any advanced imaging of her face. I wonder whether this is some TMJ dysfunction or possible trigeminal neuralgia. We will obtain MRI to evaluate and ensure there is no identifiable cause on imaging. Follow up in 4-6 wks.

## 2017-03-04 NOTE — Progress Notes (Signed)
Date of Visit: 03/04/2017   HPI:  Patient presents for follow-up.  Right thigh pain: Reports having pain in the back of her right knee and thigh for a long time. At last visit she described this as more back pain, with some tingling and numbness going down the right side. At that time I added baclofen, which she has been taking. She now endorses more posterior knee pain, and reports occasionally having swelling in the back of her knee.  Left calf itching: Has had itching of her left calf overlying a varicose vein. Has not put anything on it.  Facial pain: This persists. She saw Dr. Erin Hearing here at the [facility] 2 days ago, who recommended she try Aspercreme. This helped just a little bit, not much. She has seen a dentist, orthodontist, and facial surgeon for the symptoms, and had various x-rays and a Panorex. She denies having any prior CT or MRI. Denies any fevers. The pain is worse when she eats or chews, and sometimes with talking. It comes in flares. Not presently painful. Previously was given gabapentin, which did not help. She has been still taking this on occasion as needed. It makes her very sleepy.  Depression: currently taking venlafaxine. At last visit I prescribed Wellbutrin, which she took for a few days. She thought this medication was to treat her pain and since it did not help with pain, she stopped it after just a few days. She is willing to resume this. Denies thoughts of harming herself or others. Actually feels like her mood is doing a little bit better as she has had more family support in caring for her parents recently.  ROS: See HPI.  [place]: history of carcinoma in situ of breast, hyperlipidemia, depression/anxiety, hypertension  PHYSICAL EXAM: BP 112/74   Pulse 69   Temp 98.5 F (36.9 C) (Oral)   Ht 5\' 4"  (1.626 m)   Wt 156 lb 3.2 oz (70.9 kg)   SpO2 99%   BMI 26.81 kg/m  Gen: no acute distress, pleasant, cooperative, well appearing HEENT: normocephalic, atraumatic,  moist mucous membranes. Oropharynx clear. No anterior cervical or supraclavicular lymphadenopathy. Tympanic membranes clear bilaterally. Nares patent. No mastoid tenderness or appreciable jaw tenderness. No TMJ clicking or locking. No trismus. Neuro: alert, grossly nonfocal, speech normal Ext: normal appearance of R knee. No lower extremity edema. Normal gait.  ASSESSMENT/PLAN:  Health maintenance:  -reviewed records - last pap in 2014 with ASCUS and high risk HPV, does not seem there was ever follow up for this. Discussed with patient need to schedule pap smear here at [facility], patient will do this.  Right-sided face pain Unclear etiology, but persists. It seems that dentistry has done all the workup they plan to do. She has not had any advanced imaging of her face. I wonder whether this is some TMJ dysfunction or possible trigeminal neuralgia. We will obtain MRI to evaluate and ensure there is no identifiable cause on imaging. Follow up in 4-6 wks.   Knee pain, bilateral Complains of right posterior knee pain and R thigh pain. Unclear if she could have a Baker cyst. She has many somatic complaints. I am going to refer her to sports medicine for possible ultrasound to evaluate for Baker's cyst or other knee pathology.  Depression with anxiety Encouraged her to restart the Wellbutrin, since she only took it for a few days. Follow-up in 4 weeks. Continue venlafaxine.   Left calf itching - recommended trying moisturizer cream. Area is right  over a varicose vein, if persists could see about getting her in with a vascular specialist to treat varicose veins.  FOLLOW UP: Follow up in 4-6 weeks for above issues Schedule pap smear  [place] J. Ardelia Mems, [place]

## 2017-03-04 NOTE — Assessment & Plan Note (Signed)
Encouraged her to restart the Wellbutrin, since she only took it for a few days. Follow-up in 4 weeks. Continue venlafaxine.

## 2017-03-04 NOTE — Patient Instructions (Signed)
Checking MRI of your face to evaluate the pain you've been having  For mood - restart wellbutrin  You are due for a pap smear. Please schedule an appointment for this.  Referring to sports medicine for the back of your knee.  Follow up with me in 4-6 weeks.  Be well, Dr. Ardelia Mems

## 2017-03-04 NOTE — Assessment & Plan Note (Signed)
Complains of right posterior knee pain and R thigh pain. Unclear if she could have a Baker cyst. She has many somatic complaints. I am going to refer her to sports medicine for possible ultrasound to evaluate for Baker's cyst or other knee pathology.

## 2017-03-05 LAB — BASIC METABOLIC PANEL
BUN/Creatinine Ratio: 16 (ref 9–23)
BUN: 13 mg/dL (ref 6–24)
CALCIUM: 9.5 mg/dL (ref 8.7–10.2)
CHLORIDE: 102 mmol/L (ref 96–106)
CO2: 25 mmol/L (ref 20–29)
Creatinine, Ser: 0.83 mg/dL (ref 0.57–1.00)
GFR calc non Af Amer: 79 mL/min/{1.73_m2} (ref >59)
GFR, EST AFRICAN AMERICAN: 91 mL/min/{1.73_m2} (ref >59)
GLUCOSE: 93 mg/dL (ref 65–99)
Potassium: 4.4 mmol/L (ref 3.5–5.2)
Sodium: 141 mmol/L (ref 134–144)

## 2017-03-06 ENCOUNTER — Other Ambulatory Visit: Payer: Self-pay | Admitting: Family Medicine

## 2017-03-14 ENCOUNTER — Encounter: Payer: Self-pay | Admitting: Family Medicine

## 2017-03-15 ENCOUNTER — Ambulatory Visit (HOSPITAL_COMMUNITY): Admission: RE | Admit: 2017-03-15 | Payer: Commercial Managed Care - PPO | Source: Ambulatory Visit

## 2017-03-22 ENCOUNTER — Other Ambulatory Visit: Payer: Self-pay | Admitting: Family Medicine

## 2017-03-24 NOTE — Telephone Encounter (Signed)
Second request from [facility] for refill on Baclofen. Hubbard Hartshorn, RN, BSN

## 2017-03-31 ENCOUNTER — Other Ambulatory Visit: Payer: Self-pay | Admitting: Family Medicine

## 2017-04-01 ENCOUNTER — Other Ambulatory Visit: Payer: Self-pay | Admitting: Family Medicine

## 2017-04-04 ENCOUNTER — Ambulatory Visit (INDEPENDENT_AMBULATORY_CARE_PROVIDER_SITE_OTHER): Payer: Commercial Managed Care - PPO | Admitting: Internal Medicine

## 2017-04-04 ENCOUNTER — Encounter: Payer: Self-pay | Admitting: Internal Medicine

## 2017-04-04 ENCOUNTER — Other Ambulatory Visit (HOSPITAL_COMMUNITY)
Admission: RE | Admit: 2017-04-04 | Discharge: 2017-04-04 | Disposition: A | Discharge status: Home / Self Care | Payer: Commercial Managed Care - PPO | Source: Ambulatory Visit | Attending: Family Medicine | Admitting: Family Medicine

## 2017-04-04 VITALS — BP 138/68 | HR 58 | Temp 98.3°F | Ht 64.0 in | Wt 152.6 lb

## 2017-04-04 DIAGNOSIS — Z124 Encounter for screening for malignant neoplasm of cervix: Secondary | ICD-10-CM

## 2017-04-04 DIAGNOSIS — R51 Headache: Secondary | ICD-10-CM | POA: Diagnosis not present

## 2017-04-04 DIAGNOSIS — Z0000 Encounter for general adult medical examination without abnormal findings: Secondary | ICD-10-CM | POA: Insufficient documentation

## 2017-04-04 DIAGNOSIS — R519 Headache, unspecified: Secondary | ICD-10-CM

## 2017-04-04 MED ORDER — VENLAFAXINE HCL ER 75 MG PO CP24: 225.0000 mg | ORAL_CAPSULE | Freq: Every day | ORAL | 3 refills | Status: DC

## 2017-04-04 NOTE — Patient Instructions (Signed)
I am going to refer you to neurology to see if they have any further input for your facial pain.

## 2017-04-04 NOTE — Assessment & Plan Note (Addendum)
Patient has been several times by multiple doctors and dentist for this pain without much improvement. Unable to get the MRI due to cost. Discussed with Dr. Erin Hearing, possibly referral to neurology for another perspective. Depression could be contributing to patient's pain- patient doing okay on medication - Discussed follow up with PCP in two weeks

## 2017-04-04 NOTE — Assessment & Plan Note (Signed)
Pap smear performed  Recent mammogram in June 2018 Will follow up results

## 2017-04-04 NOTE — Progress Notes (Signed)
   [facility] Sherry Mo, MD Phone: [phone]  Reason For Visit: F/U for Pap smear   # Pap Smear Screen  -Previous pap smear in 2014 was ASCUS with no follow up since then  - No concern about STDS  - No vaginal discharge or vaginal irritation - Postmenopausal   # Jaw Pain  - Previously seen by Dr. Ardelia Mems for jaw pain Concern for TMJ versus trigeminal neuralgia, Dr. Ardelia Mems ordered an MRI for patient. Patient was not able to get this MRI as she has a high deductible and has to pay $2000. She continues to have dull pain intermittently in her jaw. This is associated sometimes with chewing or talking. Patient was previously given Aspercreme by Dr. Erin Hearing which she states did not help. She was also given gabapentin which made her sleepy but did not help. She has been seen by dentist and oral surgeon. Patient currently indicates she still has a lot going on in her mind. However has been taking her Wellbutrin and Lexapro as directed. Discussed patient talking with behavioral health but she did not have time. Np SI today   Past Medical History Reviewed problem list.  Medications- reviewed and updated No additions to family history Social history- patient is a non-smoker  Objective: BP 138/68   Pulse (!) 58   Temp 98.3 F (36.8 C) (Oral)   Ht 5\' 4"  (1.626 m)   Wt 152 lb 9.6 oz (69.2 kg)   SpO2 99%   BMI 26.19 kg/m  Gen: NAD, alert, cooperative with exam HEENT: Normal    Neck: No masses palpated. No lymphadenopathy    Jaw: no specific point tenderness, no abnormalities noted on inspection  GU: external vaginal tissue wnl, cervix wnl, no discharge from cervical os, no bleeding, no cervical motion tenderness, no abdominal/ adnexal masses Extremities: warm, well perfused, No edema, cyanosis or clubbing;    Assessment/Plan: See problem based a/p  Right-sided face pain Patient has been several times by multiple doctors and dentist for this pain without  much improvement. Unable to get the MRI due to cost. Discussed with Dr. Erin Hearing, possibly referral to neurology for another perspective. Depression could be contributing to patient's pain- patient doing okay on medication - Discussed follow up with PCP in two weeks   Healthcare maintenance Pap smear performed  Recent mammogram in June 2018 Will follow up results

## 2017-04-06 ENCOUNTER — Encounter: Payer: Self-pay | Admitting: Neurology

## 2017-04-07 ENCOUNTER — Telehealth: Payer: Self-pay | Admitting: Internal Medicine

## 2017-04-07 LAB — CYTOLOGY - PAP: HPV: DETECTED — AB

## 2017-04-07 NOTE — Telephone Encounter (Signed)
Please call patient and let her know her results from her pap smear are abnormal. She needs to come gyn clinic to have an endometrial biopsy and coloscopy performed as soon as possibly.  Thanks Asiyah

## 2017-04-13 NOTE — Telephone Encounter (Signed)
Phone numbers in chart not in service, will send letter.

## 2017-04-21 ENCOUNTER — Ambulatory Visit (INDEPENDENT_AMBULATORY_CARE_PROVIDER_SITE_OTHER): Payer: Commercial Managed Care - PPO | Admitting: Family Medicine

## 2017-04-21 ENCOUNTER — Other Ambulatory Visit (HOSPITAL_COMMUNITY)
Admission: RE | Admit: 2017-04-21 | Discharge: 2017-04-21 | Disposition: A | Discharge status: Home / Self Care | Payer: Commercial Managed Care - PPO | Source: Ambulatory Visit | Attending: Family Medicine | Admitting: Family Medicine

## 2017-04-21 DIAGNOSIS — R87619 Unspecified abnormal cytological findings in specimens from cervix uteri: Secondary | ICD-10-CM | POA: Insufficient documentation

## 2017-04-21 NOTE — Assessment & Plan Note (Signed)
colpo with endocervical curretage and endometrial biopsy today One cervical biopsy Path pending

## 2017-04-21 NOTE — Progress Notes (Signed)
   Subjective:    Patient ID: Sherry Anderson, female    DOB: [birth year], 56 y.o.   MRN: [id number]   CC: endometrial biopsy Best phone number [phone]  HPI:  No abnormal paps in past prior to ths one  No abnormal bleeding No discharge   Breast cancer age 55 Periods stopped then  No family hx of ovarian, uterine, breast cancer  Cytology Pap smear (04/04/2017) Atypical glandular cells endocervical origin. HPV detected.   Smoking status reviewed  Review of Systems   Objective:  BP 122/80   Pulse 88   Temp 98.4 F (36.9 C) (Oral)   Wt 154 lb (69.9 kg)   BMI 26.43 kg/m  WD WN NAD GU externally GU area is normal with mild atrophic changes.    Assessment & Plan:    Patient given informed consent, signed copy in the chart.  Placed in lithotomy position. Cervix viewed with speculum and colposcope after application of acetic acid.   Colposcopy adequate (entire squamocolumnar junctions seen  in entirety) ?  yes Acet[place]hite lesions? no Punctation? no Mosaicism?  no Abnormal vasculature?  YES Biopsies? YES 12:00 ECC? YES Complications?  None monsels used for hemostasis at biospy site. Endocervical curretage performed using currette.    PROCEDURE NOTE: Endometrial Biopsy Patient given informed consent, signed copy in the chart.  Appropriate time out taken. The patient was placed in the lithotomy position and the cervix brought into view with sterile speculum.  The portion of cervix was cleansed x 3 with betadine swabs.  A tenaculum was placed in the anterior lip of the cervix.  A uterine sound was used to measure the uterus.. A pipelle was introduced  into the uterus, suction created,  and an endometrial sample was obtained. All equipment was removed and accounted for.   The patient tolerated the procedure well.  Minimal spotting type bleeding.    Patient given post procedure instructions.  I will notify her of pathology results. Some concern given abnormal  non-arborizing vessel at 12:00 and pap smear with AGUS.      No problem-specific Assessment & Plan notes found for this encounter.    No Follow-up on file.   Caroline More, DO, PGY-1

## 2017-04-21 NOTE — Progress Notes (Signed)
[place] Attending Note: I have seen and examined this patient.I assisted in all procedures. Agree with assessment and plan.

## 2017-04-24 ENCOUNTER — Other Ambulatory Visit: Payer: Self-pay | Admitting: Family Medicine

## 2017-04-27 ENCOUNTER — Other Ambulatory Visit: Payer: Self-pay | Admitting: Family Medicine

## 2017-04-27 DIAGNOSIS — R87619 Unspecified abnormal cytological findings in specimens from cervix uteri: Secondary | ICD-10-CM

## 2017-04-27 LAB — CYTOLOGY - PAP
Diagnosis: UNDETERMINED — AB
HPV: DETECTED — AB

## 2017-04-27 NOTE — Progress Notes (Signed)
[phone] (M)  I spoke with her and told her about results and need for GYN evaluation. I emphasized nothing is acutely worrisome but that this definitely needs follow up and she agrees. Referral put in and she will call us if she has not heard from them in 2 weeks. Dr. Ardelia Mems also informed and patient appreciated the consultation with her PCP.

## 2017-04-28 ENCOUNTER — Telehealth: Payer: Self-pay | Admitting: *Deleted

## 2017-04-28 NOTE — Telephone Encounter (Signed)
Creola Corn with [facility] called to request insurance information related to referral. Information faxed per request.  Derl Barrow, RN

## 2017-05-03 ENCOUNTER — Other Ambulatory Visit: Payer: Self-pay | Admitting: Family Medicine

## 2017-05-10 ENCOUNTER — Other Ambulatory Visit: Payer: Self-pay | Admitting: *Deleted

## 2017-05-10 MED ORDER — OMEPRAZOLE 20 MG PO CPDR: 20.0000 mg | DELAYED_RELEASE_CAPSULE | Freq: Two times a day (BID) | ORAL | 3 refills | Status: DC

## 2017-05-10 MED ORDER — VENLAFAXINE HCL ER 75 MG PO CP24: ORAL_CAPSULE | ORAL | 1 refills | Status: DC

## 2017-05-10 NOTE — Telephone Encounter (Signed)
[facility] called requesting a 90 day supply for omeprazole 20 mg and venlafaxine XR 75 mg. Please advise.  Derl Barrow, RN

## 2017-05-12 ENCOUNTER — Encounter: Payer: Self-pay | Admitting: Gynecology

## 2017-05-12 ENCOUNTER — Ambulatory Visit (INDEPENDENT_AMBULATORY_CARE_PROVIDER_SITE_OTHER): Payer: Commercial Managed Care - PPO | Admitting: Gynecology

## 2017-05-12 VITALS — BP 118/74 | Ht 63.0 in | Wt 154.0 lb

## 2017-05-12 DIAGNOSIS — R87619 Unspecified abnormal cytological findings in specimens from cervix uteri: Secondary | ICD-10-CM

## 2017-05-12 DIAGNOSIS — D069 Carcinoma in situ of cervix, unspecified: Secondary | ICD-10-CM

## 2017-05-12 NOTE — Patient Instructions (Signed)
Office will call you with the biopsy results  Office will call you to arrange for surgery

## 2017-05-12 NOTE — Progress Notes (Addendum)
    Sherry Anderson 1961-05-20 [id number]        56 y.o.  G2P003 new patient who presents in consultation from Dr. Dorcas Mcmurray at [facility] in reference to most recent screening Pap smear showing atypical glandular cells, favor endocervical origin and positive high-risk HPV. Follow up Pap smear showed ASCUS with positive HPV. Colposcopy was performed which showed atypical vessels at 12:00 and biopsy showed HGSIL. Endometrial biopsy showed atypical epithelium but is too scant to further categorize. Patient does have a history of ASCUS with positive high-risk HPV in 2014 and does not appear that a follow up was performed. History of cesarean section 1 for twins and follow up vaginal delivery. History of left breast lumpectomy 2002.  Past medical history,surgical history, problem list, medications, allergies, family history and social history were all reviewed and documented in the EPIC chart.  Directed ROS with pertinent positives and negatives documented in the history of present illness/assessment and plan.  Exam: Caryn Bee assistant Vitals:   05/12/17 1151  BP: 118/74  Weight: 154 lb (69.9 kg)  Height: 5\' 3"  (1.6 m)   General appearance:  Normal Both breast examined lying and sitting. Right without masses, retractions, discharge, adenopathy. Left with well-healed lumpectomy scar. No masses, discharge, adenopathy Abdomen soft nontender without masses guarding rebound Pelvic external BUS vagina with atrophic changes. Cervix with atrophic changes. Uterus grossly normal midline mobile nontender. Adnexa without masses or tenderness.  Colposcopy performed after acetic acid cleanse was borderline adequate using swab for manipulation transformation zone was seen within the canal 360. Generalized inflammatory changes noted along transformation zone. Random biopsy at 6:00 taken and subsequent ECC performed. Patient tolerated well. Physical Exam  Genitourinary:         Assessment/Plan:  56 y.o. G2P0 with high-grade dysplasia and atypical glandular cells suspect endocervical origin. Endometrium not well evaluated. Suspect the atypical glandular cells are in response to the high-grade dysplasia. Reviewed the difference between squamous and glandular atypia with the patient. High-grade dysplasia is a precancerous condition and the need to treat. Various options for treatment reviewed. Given the atypical glandular cells will plan on cold knife cone to avoid thermal artifact/damage to the endocervical tissue and facilitate pathology reviewed. Will also plan hysteroscopy D&C at the same time for complete endometrial assessment. We reviewed in general what is involved with the procedure and the potential for pathologic outcomes.  Possible need for future treatments up to and including hysterectomy was also discussed. At this point she has no other indication for hysterectomy, doing no bleeding or having any issues with pelvic pain. We discussed that if with Cone biopsy we excise all of the dysplasia that she may not require a more involved procedure in the future but certainly no guarantees and that she would need continued surveillance regardless particularly with her positive HPV.  Patients breast exam was normal status post lumpectomy on the left. No evidence of disease. Mammography 04/2017. Patient will continue with annual mammography when due.  Consults follow up was sent to Dr. Dorcas Mcmurray and Dr. Libby Maw MD, 12:30 PM 05/12/2017

## 2017-05-13 ENCOUNTER — Other Ambulatory Visit: Payer: Self-pay | Admitting: Family Medicine

## 2017-05-13 ENCOUNTER — Telehealth: Payer: Self-pay

## 2017-05-13 NOTE — Telephone Encounter (Signed)
I called patient to discuss scheduling surgery.  We discussed her ins benefits and her estimated surgery prepymt. Patient wants to speak with her family this weekend about this before proceeding with scheduling. I told her that very important she has this procedure and if not able to pay the full amount I will talk with my Clinic Mgr. To see if we can work with her on this. I will wait to hear from her on Monday.

## 2017-05-16 DIAGNOSIS — D069 Carcinoma in situ of cervix, unspecified: Secondary | ICD-10-CM

## 2017-05-16 DIAGNOSIS — R87613 High grade squamous intraepithelial lesion on cytologic smear of cervix (HGSIL): Secondary | ICD-10-CM

## 2017-05-16 HISTORY — DX: High grade squamous intraepithelial lesion on cytologic smear of cervix (HGSIL): R87.613

## 2017-05-16 HISTORY — DX: Carcinoma in situ of cervix, unspecified: D06.9

## 2017-05-18 ENCOUNTER — Encounter (HOSPITAL_COMMUNITY): Payer: Self-pay | Admitting: Emergency Medicine

## 2017-05-18 ENCOUNTER — Telehealth: Payer: Self-pay

## 2017-05-18 ENCOUNTER — Ambulatory Visit (HOSPITAL_COMMUNITY)
Admission: EM | Admit: 2017-05-18 | Discharge: 2017-05-18 | Disposition: A | Discharge status: Home / Self Care | Payer: Commercial Managed Care - PPO | Attending: Family Medicine | Admitting: Family Medicine

## 2017-05-18 ENCOUNTER — Encounter: Payer: Self-pay | Admitting: Gynecology

## 2017-05-18 DIAGNOSIS — M25561 Pain in right knee: Secondary | ICD-10-CM | POA: Diagnosis not present

## 2017-05-18 MED ORDER — NAPROXEN 500 MG PO TABS: 500.0000 mg | ORAL_TABLET | Freq: Two times a day (BID) | ORAL | 0 refills | Status: AC

## 2017-05-18 MED ORDER — MISOPROSTOL 200 MCG PO TABS: ORAL_TABLET | ORAL | 0 refills | Status: DC

## 2017-05-18 NOTE — Discharge Instructions (Signed)
Take naproxen as directed. Ice/heat compresses needed. Elevate your legs when resting. Wear knee brace during activity. This can take a few weeks to resolve, but you should be feeling better each week. Follow-up with orthopedics or PCP if symptoms do not improve.

## 2017-05-18 NOTE — Telephone Encounter (Signed)
Dr. Loetta Rough- Patient needs to know how long out of work for recovery?   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

## 2017-05-18 NOTE — ED Triage Notes (Signed)
Pt reports right knee pain with ambulation that started on Saturday.  Pt reports the pain is on the lower part of her knee.  She denies any injury or fall.

## 2017-05-18 NOTE — Telephone Encounter (Signed)
Patient called to schedule surgery. I asked her about call yesterday where she was going to get 2nd opinion. From what I understood she decided against that and is ready to schedule.  We had previously discussed her insurance benefits and estimated surgery prepymt amount. We reviewed that again and I will send her a financial letter.  I will also mail her a pamphlet from [facility].  Prep op cons was set for 05/27/17 at 4:20pm with Dr. Loetta Rough. Patient was advised regarding need for Cytotec vaginally hs before surgery and that Rx was sent to pharmacy.  Patient will have Disability forms and was advised regarding needing to sign med records release form so I can send info to the disability co. And also advised regarding associated fee.

## 2017-05-18 NOTE — ED Provider Notes (Signed)
[facility]    CSN: [id number] Arrival date & time: 05/18/17  1133     History   Chief Complaint Chief Complaint  Patient presents with  . Knee Pain    right    HPI Sherry Anderson is a 56 y.o. female.   56 year old female with history of breast cancer, CIN III comes in for right knee pain starting 4 days ago. States knee pain is present when walking and putting pressure, feeling like it is going to give out. Denies pain when sitting/standing. Denies injury. Has had some numbness/tingling of the lower leg. She was walking when the pain first started, denies falling/twisting knee. Was wearing good supportive shoes. Has been applying bengay, taking tylenol with good relief, but pain returns.       Past Medical History:  Diagnosis Date  . Absence of menstruation   . Anxiety   . Breast cancer ([place]) 2002  . Carpal tunnel syndrome   . Depressive disorder, not elsewhere classified   . Essential hypertension, benign   . GERD (gastroesophageal reflux disease)   . Headache(784.0)   . Lateral epicondylitis  of elbow   . Myalgia and myositis, unspecified   . Other and unspecified hyperlipidemia   . Other malaise and fatigue   . Other specified gastritis without mention of hemorrhage   . Unspecified hearing loss    right ear    Patient Active Problem List   Diagnosis Date Noted  . Atypical cervical glandular cells 04/21/2017  . Healthcare maintenance 04/04/2017  . Right-sided face pain 03/02/2017  . Tremor of right hand 11/07/2015  . Right-sided sensorineural hearing loss 11/13/2014  . DDD (degenerative disc disease), lumbar 10/21/2014  . URI (upper respiratory infection) 05/20/2014  . Knee pain, bilateral 11/06/2013  . Onychomycosis 11/06/2013  . Left wrist pain 03/02/2013  . Exertional angina (HCC) 03/30/2012  . Anemia 01/25/2012  . Right ear pain 01/19/2012  . HEADACHE 02/04/2010  . Hyperlipidemia 08/07/2009  . HEARING LOSS, RIGHT EAR 01/17/2008  .  CARPAL TUNNEL SYNDROME, BILATERAL 07/31/2007  . LATERAL EPICONDYLITIS, BILATERAL 07/18/2007  . Fatigue due to depression 04/06/2007  . Gastritis and gastroduodenitis 11/21/2006  . Depression with anxiety 10/17/2006  . HYPERTENSION, BENIGN ESSENTIAL 10/17/2006  . CA IN SITU, BREAST 02/15/2001    Past Surgical History:  Procedure Laterality Date  . BREAST LUMPECTOMY  2002   ductal CA in situ  . CESAREAN SECTION  1990  . UPPER GASTROINTESTINAL ENDOSCOPY      OB History    Gravida Para Term Preterm AB Living   2         3   SAB TAB Ectopic Multiple Live Births         1         Home Medications    Prior to Admission medications   Medication Sig Start Date End Date Taking? Authorizing Provider  buPROPion (WELLBUTRIN XL) 150 MG 24 hr tablet TAKE 1 TABLET BY MOUTH EVERY DAY 03/07/17  Yes Chambliss, Jeb Levering, MD  Calcium Carbonate-Vitamin D (CALTRATE 600+D) 600-400 MG-UNIT tablet Take 1 tablet by mouth 2 (two) times daily. 10/27/16  Yes Haney, Alyssa A, MD  lisinopril (PRINIVIL,ZESTRIL) 20 MG tablet Take 1 tablet (20 mg total) by mouth daily. 02/10/17  Yes Leeanne Rio, MD  omeprazole (PRILOSEC) 20 MG capsule Take 1 capsule (20 mg total) by mouth 2 (two) times daily before a meal. 05/10/17  Yes Chambliss, Jeb Levering, MD  triamterene-hydrochlorothiazide (DYAZIDE) 37.5-25  MG capsule Take 1 each (1 capsule total) by mouth daily. 02/10/17  Yes Leeanne Rio, MD  venlafaxine XR (EFFEXOR-XR) 75 MG 24 hr capsule TAKE 3 CAPSULES EVERY DAY 05/10/17  Yes Chambliss, Jeb Levering, MD  baclofen (LIORESAL) 10 MG tablet TAKE 1/2 TABLET 3 TIMES A DAY AS NEEDED FOR MUSCLE SPASM 05/16/17   Alveda Reasons, MD  naproxen (NAPROSYN) 500 MG tablet Take 1 tablet (500 mg total) by mouth 2 (two) times daily. 05/18/17 05/28/17  Tasia Catchings, Yamina Lenis V, PA-C  trolamine salicylate (ASPERCREME/ALOE) 10 % cream Apply 1 application topically as needed for muscle pain. 03/02/17   Lind Covert, MD    Family  History Family History  Problem Relation Age of Onset  . Diabetes Mother   . Hypertension Mother   . Thyroid disease Sister   . Schizophrenia Brother   . Colon cancer Neg Hx   . Esophageal cancer Neg Hx   . Rectal cancer Neg Hx   . Stomach cancer Neg Hx   . Breast cancer Neg Hx     Social History Social History  Substance Use Topics  . Smoking status: Never Smoker  . Smokeless tobacco: Never Used  . Alcohol use No     Allergies   Patient has no known allergies.   Review of Systems Review of Systems  Reason unable to perform ROS: See HPI as above.     Physical Exam Triage Vital Signs ED Triage Vitals  Enc Vitals Group     BP 05/18/17 1200 117/72     Pulse Rate 05/18/17 1200 76     Resp --      Temp 05/18/17 1200 98.2 F (36.8 C)     Temp Source 05/18/17 1200 Oral     SpO2 05/18/17 1200 100 %     Weight --      Height --      Head Circumference --      Peak Flow --      Pain Score 05/18/17 1201 10     Pain Loc --      Pain Edu? --      Excl. in [place]? --    No data found.   Updated Vital Signs BP 117/72 (BP Location: Right Arm)   Pulse 76   Temp 98.2 F (36.8 C) (Oral)   SpO2 100%   Physical Exam  Constitutional: She is oriented to person, place, and time. She appears well-developed and well-nourished. No distress.  HENT:  Head: Normocephalic and atraumatic.  Eyes: Pupils are equal, round, and reactive to light. Conjunctivae are normal.  Cardiovascular: Normal rate, regular rhythm and normal heart sounds.  Exam reveals no gallop and no friction rub.   No murmur heard. Pulmonary/Chest: Effort normal and breath sounds normal. She has no wheezes. She has no rales.  Musculoskeletal:       Right knee: She exhibits no swelling and no effusion. Tenderness found. Patellar tendon tenderness noted. No medial joint line, no lateral joint line, no MCL and no LCL tenderness noted.  Neurological: She is alert and oriented to person, place, and time.  Skin: Skin  is warm and dry.     UC Treatments / Results  Labs (all labs ordered are listed, but only abnormal results are displayed) Labs Reviewed - No data to display  EKG  EKG Interpretation None       Radiology No results found.  Procedures Procedures (including critical care time)  Medications Ordered in UC Medications -  No data to display   Initial Impression / Assessment and Plan / UC Course  I have reviewed the triage vital signs and the nursing notes.  Pertinent labs & imaging results that were available during my care of the patient were reviewed by me and considered in my medical decision making (see chart for details).    Discussed with patient history and exam most consistent with tendinitis. Naproxen as directed. Knee sleeve during activity. Follow up with PCP or orthopedics if symptoms do not improve for further evaluation.   Final Clinical Impressions(s) / UC Diagnoses   Final diagnoses:  Acute pain of right knee    New Prescriptions New Prescriptions   NAPROXEN (NAPROSYN) 500 MG TABLET    Take 1 tablet (500 mg total) by mouth 2 (two) times daily.      Ok Edwards, PA-C 05/18/17 1306

## 2017-05-18 NOTE — Telephone Encounter (Signed)
Patient informed. 

## 2017-05-18 NOTE — Telephone Encounter (Signed)
One week should be fine.

## 2017-05-23 ENCOUNTER — Other Ambulatory Visit: Payer: Self-pay | Admitting: Family Medicine

## 2017-05-27 ENCOUNTER — Ambulatory Visit (INDEPENDENT_AMBULATORY_CARE_PROVIDER_SITE_OTHER): Payer: Commercial Managed Care - PPO | Admitting: Gynecology

## 2017-05-27 VITALS — BP 116/74

## 2017-05-27 DIAGNOSIS — R87613 High grade squamous intraepithelial lesion on cytologic smear of cervix (HGSIL): Secondary | ICD-10-CM

## 2017-05-27 NOTE — Patient Instructions (Signed)
follow up for surgery as scheduled

## 2017-05-27 NOTE — Progress Notes (Signed)
Sherry Anderson 02-Mar-1961 [id number]   Preoperative consult  Chief complaint: High-grade cervical dysplasia, atypical glandular cells  History of present illness: 56 y.o. G2P0 with history of recent screening Pap smear showing atypical glandular cells favor endocervical origin and a positive high-risk HPV. Repeat Pap smear showed ASCUS with positive high-risk HPV. Colposcopy showed atypical vessels at 12:00 with biopsy showing HGSIL. Endometrial biopsy at that time showed rare fragment of atypical epithelium. Repeat colposcopy showed inflammatory changes with random 6:00 biopsy showing HGSIL. ECC showed benign endocervical tissue but also fragment of dysplastic squamous epithelium too small to grade. Patient admitted for cone biopsy of the cervix given the atypical glandular cells to preserve margins and hysteroscopy D&C for complete endometrial assessment.  Past Medical History:  Diagnosis Date  . Absence of menstruation   . Anxiety   . Breast cancer ([place]) 2002  . Carpal tunnel syndrome   . Depressive disorder, not elsewhere classified   . Essential hypertension, benign   . GERD (gastroesophageal reflux disease)   . Headache(784.0)   . Lateral epicondylitis  of elbow   . Myalgia and myositis, unspecified   . Other and unspecified hyperlipidemia   . Other malaise and fatigue   . Other specified gastritis without mention of hemorrhage   . Unspecified hearing loss    right ear    Past Surgical History:  Procedure Laterality Date  . BREAST LUMPECTOMY  2002   ductal CA in situ  . CESAREAN SECTION  1990  . UPPER GASTROINTESTINAL ENDOSCOPY      Family History  Problem Relation Age of Onset  . Diabetes Mother   . Hypertension Mother   . Thyroid disease Sister   . Schizophrenia Brother   . Colon cancer Neg Hx   . Esophageal cancer Neg Hx   . Rectal cancer Neg Hx   . Stomach cancer Neg Hx   . Breast cancer Neg Hx     Social History:  reports that she has never smoked. She  has never used smokeless tobacco. She reports that she does not drink alcohol or use drugs.  Allergies: No Known Allergies  Medications: See current Epic entry  ROS:  Was performed and pertinent positives and negatives are included in the history of present illness.  Exam:  Caryn Bee assistant Vitals:   05/27/17 1606  BP: 116/74   General: well developed, well nourished female, no acute distress HEENT: normal  Lungs: clear to auscultation without wheezing, rales or rhonchi  Cardiac: regular rate without rubs, murmurs or gallops  Abdomen: soft, nontender without masses, guarding, rebound, organomegaly  Pelvic: external bus vagina: normal with atrophic changes Cervix: grossly normal with atrophic changes  Uterus: normal size, midline and mobile, nontender  Adnexa: without masses or tenderness    Assessment/Plan:  57 y.o. G2P0 with history of high-grade cervical dysplasia, atypical glandular cells and fragment of atypical epithelium from the endometrium. Patient admitted for cone biopsy of the cervix and hysteroscopy D&C. I reviewed the proposed surgery with the patient to include the expected intraoperative and postoperative courses as well as the recovery period. I used diagrams to show her the surgery and to answer her questions. We reviewed pathology possibilities from the cone biopsy and she understands that several possibilities exist to include no pathology found, pathology found and excised with clear margins and the possibility of cut through lesions with residual disease that may need treatment in the future.  I discussed the risks of the surgery to include damage to internal  organs including vagina, cervix, bladder and rectum. The risk of uterine perforation during the hysteroscopy with internal organ damage including bowel, bladder, ureters, vessels, nerves necessitating major exploratory reparative surgeries and future reparative surgeries including ostomy formation was all  discussed with her. The risks of infection following the procedure requiring prolonged antibiotics as well as the risk of bleeding and hemorrhage necessitating transfusion with risks of transfusion reaction hepatitis HIV mad cow disease and other unknown entities was all discussed with her.  patient's questions were answered her satisfaction and she is ready to proceed with surgery.    Anastasio Auerbach MD, 4:57 PM 05/27/2017

## 2017-05-27 NOTE — H&P (Signed)
Griselda Bramblett 06-Apr-1961 [id number]   History and Physical  Chief complaint: High-grade cervical dysplasia, atypical glandular cells  History of present illness: 56 y.o. G2P0 with history of recent screening Pap smear showing atypical glandular cells favor endocervical origin and a positive high-risk HPV. Repeat Pap smear showed ASCUS with positive high-risk HPV. Colposcopy showed atypical vessels at 12:00 with biopsy showing HGSIL. Endometrial biopsy at that time showed rare fragment of atypical epithelium. Repeat colposcopy showed inflammatory changes with random 6:00 biopsy showing HGSIL. ECC showed benign endocervical tissue but also fragment of dysplastic squamous epithelium too small to grade. Patient admitted for cone biopsy of the cervix given the atypical glandular cells to preserve margins and hysteroscopy D&C for complete endometrial assessment.  Past Medical History:  Diagnosis Date  . Absence of menstruation   . Anxiety   . Breast cancer ([place]) 2002  . Carpal tunnel syndrome   . Depressive disorder, not elsewhere classified   . Essential hypertension, benign   . GERD (gastroesophageal reflux disease)   . Headache(784.0)   . Lateral epicondylitis  of elbow   . Myalgia and myositis, unspecified   . Other and unspecified hyperlipidemia   . Other malaise and fatigue   . Other specified gastritis without mention of hemorrhage   . Unspecified hearing loss    right ear    Past Surgical History:  Procedure Laterality Date  . BREAST LUMPECTOMY  2002   ductal CA in situ  . CESAREAN SECTION  1990  . UPPER GASTROINTESTINAL ENDOSCOPY      Family History  Problem Relation Age of Onset  . Diabetes Mother   . Hypertension Mother   . Thyroid disease Sister   . Schizophrenia Brother   . Colon cancer Neg Hx   . Esophageal cancer Neg Hx   . Rectal cancer Neg Hx   . Stomach cancer Neg Hx   . Breast cancer Neg Hx     Social History:  reports that she has never smoked. She  has never used smokeless tobacco. She reports that she does not drink alcohol or use drugs.  Allergies: No Known Allergies  Medications: See current Epic entry  ROS:  Was performed and pertinent positives and negatives are included in the history of present illness.  Exam:  Caryn Bee assistant Vitals:   05/27/17 1606  BP: 116/74   General: well developed, well nourished female, no acute distress HEENT: normal  Lungs: clear to auscultation without wheezing, rales or rhonchi  Cardiac: regular rate without rubs, murmurs or gallops  Abdomen: soft, nontender without masses, guarding, rebound, organomegaly  Pelvic: external bus vagina: normal with atrophic changes Cervix: grossly normal with atrophic changes  Uterus: normal size, midline and mobile, nontender  Adnexa: without masses or tenderness    Assessment/Plan:  56 y.o. G2P0 with history of high-grade cervical dysplasia, atypical glandular cells and fragment of atypical epithelium from the endometrium. Patient admitted for cone biopsy of the cervix and hysteroscopy D&C. I reviewed the proposed surgery with the patient to include the expected intraoperative and postoperative courses as well as the recovery period. I used diagrams to show her the surgery and to answer her questions. We reviewed pathology possibilities from the cone biopsy and she understands that several possibilities exist to include no pathology found, pathology found and excised with clear margins and the possibility of cut through lesions with residual disease that may need treatment in the future.  I discussed the risks of the surgery to include damage to  internal organs including vagina, cervix, bladder and rectum. The risk of uterine perforation during the hysteroscopy with internal organ damage including bowel, bladder, ureters, vessels, nerves necessitating major exploratory reparative surgeries and future reparative surgeries including ostomy formation was all  discussed with her. The risks of infection following the procedure requiring prolonged antibiotics as well as the risk of bleeding and hemorrhage necessitating transfusion with risks of transfusion reaction hepatitis HIV mad cow disease and other unknown entities was all discussed with her.  patient's questions were answered her satisfaction and she is ready to proceed with surgery.   Anastasio Auerbach MD, 5:09 PM 05/27/2017

## 2017-05-30 DIAGNOSIS — Z0289 Encounter for other administrative examinations: Secondary | ICD-10-CM

## 2017-05-31 ENCOUNTER — Encounter (HOSPITAL_BASED_OUTPATIENT_CLINIC_OR_DEPARTMENT_OTHER): Payer: Self-pay | Admitting: *Deleted

## 2017-05-31 NOTE — Progress Notes (Signed)
NPO AFTER MN.  ARRIVE AT 0530.  NEEDS EKG.  GETTING CBC AND CMET DONE Friday 05-31-2017.  WILL TAKE EFFEXOR, WELLBUTRIN, AND PRILOSEC AM DOS W/ SIPS OF WATER.

## 2017-06-03 DIAGNOSIS — K219 Gastro-esophageal reflux disease without esophagitis: Secondary | ICD-10-CM | POA: Diagnosis not present

## 2017-06-03 DIAGNOSIS — N841 Polyp of cervix uteri: Secondary | ICD-10-CM | POA: Diagnosis not present

## 2017-06-03 DIAGNOSIS — F329 Major depressive disorder, single episode, unspecified: Secondary | ICD-10-CM | POA: Diagnosis not present

## 2017-06-03 DIAGNOSIS — Z79899 Other long term (current) drug therapy: Secondary | ICD-10-CM | POA: Diagnosis not present

## 2017-06-03 DIAGNOSIS — F419 Anxiety disorder, unspecified: Secondary | ICD-10-CM | POA: Diagnosis not present

## 2017-06-03 DIAGNOSIS — D070 Carcinoma in situ of endometrium: Secondary | ICD-10-CM | POA: Diagnosis present

## 2017-06-03 DIAGNOSIS — D069 Carcinoma in situ of cervix, unspecified: Secondary | ICD-10-CM | POA: Diagnosis not present

## 2017-06-03 DIAGNOSIS — Z792 Long term (current) use of antibiotics: Secondary | ICD-10-CM | POA: Diagnosis not present

## 2017-06-03 DIAGNOSIS — I10 Essential (primary) hypertension: Secondary | ICD-10-CM | POA: Diagnosis not present

## 2017-06-03 DIAGNOSIS — E785 Hyperlipidemia, unspecified: Secondary | ICD-10-CM | POA: Diagnosis not present

## 2017-06-03 DIAGNOSIS — N840 Polyp of corpus uteri: Secondary | ICD-10-CM | POA: Diagnosis not present

## 2017-06-03 DIAGNOSIS — Z853 Personal history of malignant neoplasm of breast: Secondary | ICD-10-CM | POA: Diagnosis not present

## 2017-06-03 LAB — COMPREHENSIVE METABOLIC PANEL
ALT: 20 U/L (ref 14–54)
AST: 20 U/L (ref 15–41)
Albumin: 4 g/dL (ref 3.5–5.0)
Alkaline Phosphatase: 53 U/L (ref 38–126)
Anion gap: 10 (ref 5–15)
BUN: 17 mg/dL (ref 6–20)
CHLORIDE: 103 mmol/L (ref 101–111)
CO2: 27 mmol/L (ref 22–32)
CREATININE: 0.96 mg/dL (ref 0.44–1.00)
Calcium: 9.2 mg/dL (ref 8.9–10.3)
GFR calc non Af Amer: >60 mL/min (ref >60)
Glucose, Bld: 75 mg/dL (ref 65–99)
Potassium: 3.6 mmol/L (ref 3.5–5.1)
SODIUM: 140 mmol/L (ref 135–145)
Total Bilirubin: 0.5 mg/dL (ref 0.3–1.2)
Total Protein: 7.6 g/dL (ref 6.5–8.1)

## 2017-06-03 LAB — CBC
HCT: 34.1 % — ABNORMAL LOW (ref 36.0–46.0)
Hemoglobin: 11.6 g/dL — ABNORMAL LOW (ref 12.0–15.0)
MCH: 30.3 pg (ref 26.0–34.0)
MCHC: 34 g/dL (ref 30.0–36.0)
MCV: 89 fL (ref 78.0–100.0)
PLATELETS: 262 10*3/uL (ref 150–400)
RBC: 3.83 MIL/uL — AB (ref 3.87–5.11)
RDW: 13.1 % (ref 11.5–15.5)
WBC: 4.6 10*3/uL (ref 4.0–10.5)

## 2017-06-06 ENCOUNTER — Telehealth: Payer: Self-pay

## 2017-06-06 ENCOUNTER — Other Ambulatory Visit: Payer: Self-pay | Admitting: Gynecology

## 2017-06-06 MED ORDER — MISOPROSTOL 200 MCG PO TABS: ORAL_TABLET | ORAL | 0 refills | Status: DC

## 2017-06-06 NOTE — Telephone Encounter (Signed)
Received a return to work form and Dr. Loetta Rough upon review and further consideration of job description thought patient should take 2 weeks out of work to recover.  Form was completed to reflect that. I called patient to let her know and since FMLA was completed with just one week out of work I told her that if employer wanted me to correct original FMLA form or complete another I will be happy to.   Patient notified me that they Cytotec tab was not at her pharmacy. Upon further checking it was sent to her [facility] on file and [facility]. [facility] and called [facility] and cancelled it.

## 2017-06-07 ENCOUNTER — Encounter: Payer: Self-pay | Admitting: Gynecology

## 2017-06-07 NOTE — Anesthesia Preprocedure Evaluation (Addendum)
Anesthesia Evaluation  Patient identified by MRN, date of birth, ID band Patient awake    Reviewed: Allergy & Precautions, H&P , Patient's Chart, lab work & pertinent test results, reviewed documented beta blocker date and time   Airway Mallampati: II  TM Distance: >3 FB Neck ROM: full    Dental no notable dental hx.    Pulmonary    Pulmonary exam normal breath sounds clear to auscultation       Cardiovascular hypertension, On Medications  Rhythm:regular Rate:Normal     Neuro/Psych    GI/Hepatic   Endo/Other    Renal/GU      Musculoskeletal   Abdominal   Peds  Hematology   Anesthesia Other Findings   Reproductive/Obstetrics                             Anesthesia Physical Anesthesia Plan  ASA: II  Anesthesia Plan: General   Post-op Pain Management:    Induction: Intravenous  PONV Risk Score and Plan:   Airway Management Planned: LMA  Additional Equipment:   Intra-op Plan:   Post-operative Plan:   Informed Consent: I have reviewed the patients History and Physical, chart, labs and discussed the procedure including the risks, benefits and alternatives for the proposed anesthesia with the patient or authorized representative who has indicated his/her understanding and acceptance.   Dental Advisory Given  Plan Discussed with: CRNA and Surgeon  Anesthesia Plan Comments: ( )        Anesthesia Quick Evaluation

## 2017-06-08 ENCOUNTER — Encounter (HOSPITAL_BASED_OUTPATIENT_CLINIC_OR_DEPARTMENT_OTHER)
Admission: RE | Disposition: A | Discharge status: Home / Self Care | Payer: Self-pay | Source: Ambulatory Visit | Attending: Gynecology

## 2017-06-08 ENCOUNTER — Ambulatory Visit (HOSPITAL_BASED_OUTPATIENT_CLINIC_OR_DEPARTMENT_OTHER): Payer: Commercial Managed Care - PPO | Admitting: Anesthesiology

## 2017-06-08 ENCOUNTER — Ambulatory Visit (HOSPITAL_BASED_OUTPATIENT_CLINIC_OR_DEPARTMENT_OTHER)
Admission: RE | Admit: 2017-06-08 | Discharge: 2017-06-08 | Disposition: A | Discharge status: Home / Self Care | Payer: Commercial Managed Care - PPO | Source: Ambulatory Visit | Attending: Gynecology | Admitting: Gynecology

## 2017-06-08 ENCOUNTER — Encounter (HOSPITAL_BASED_OUTPATIENT_CLINIC_OR_DEPARTMENT_OTHER): Payer: Self-pay | Admitting: *Deleted

## 2017-06-08 DIAGNOSIS — E785 Hyperlipidemia, unspecified: Secondary | ICD-10-CM | POA: Insufficient documentation

## 2017-06-08 DIAGNOSIS — I10 Essential (primary) hypertension: Secondary | ICD-10-CM | POA: Insufficient documentation

## 2017-06-08 DIAGNOSIS — F419 Anxiety disorder, unspecified: Secondary | ICD-10-CM | POA: Insufficient documentation

## 2017-06-08 DIAGNOSIS — D069 Carcinoma in situ of cervix, unspecified: Secondary | ICD-10-CM | POA: Diagnosis not present

## 2017-06-08 DIAGNOSIS — N840 Polyp of corpus uteri: Secondary | ICD-10-CM | POA: Diagnosis not present

## 2017-06-08 DIAGNOSIS — Z792 Long term (current) use of antibiotics: Secondary | ICD-10-CM | POA: Insufficient documentation

## 2017-06-08 DIAGNOSIS — K219 Gastro-esophageal reflux disease without esophagitis: Secondary | ICD-10-CM | POA: Insufficient documentation

## 2017-06-08 DIAGNOSIS — Z853 Personal history of malignant neoplasm of breast: Secondary | ICD-10-CM | POA: Insufficient documentation

## 2017-06-08 DIAGNOSIS — Z79899 Other long term (current) drug therapy: Secondary | ICD-10-CM | POA: Insufficient documentation

## 2017-06-08 DIAGNOSIS — N841 Polyp of cervix uteri: Secondary | ICD-10-CM | POA: Diagnosis not present

## 2017-06-08 DIAGNOSIS — R87613 High grade squamous intraepithelial lesion on cytologic smear of cervix (HGSIL): Secondary | ICD-10-CM | POA: Diagnosis not present

## 2017-06-08 DIAGNOSIS — F329 Major depressive disorder, single episode, unspecified: Secondary | ICD-10-CM | POA: Insufficient documentation

## 2017-06-08 DIAGNOSIS — R87619 Unspecified abnormal cytological findings in specimens from cervix uteri: Secondary | ICD-10-CM | POA: Diagnosis not present

## 2017-06-08 HISTORY — DX: Anxiety disorder, unspecified: F41.9

## 2017-06-08 HISTORY — PX: CERVICAL CONIZATION W/BX: SHX1330

## 2017-06-08 HISTORY — DX: Major depressive disorder, single episode, unspecified: F32.9

## 2017-06-08 HISTORY — DX: Personal history of irradiation: Z92.3

## 2017-06-08 HISTORY — DX: Depression, unspecified: F32.A

## 2017-06-08 HISTORY — DX: Personal history of adenomatous and serrated colon polyps: Z86.0101

## 2017-06-08 HISTORY — DX: Personal history of colonic polyps: Z86.010

## 2017-06-08 HISTORY — PX: DILATATION & CURETTAGE/HYSTEROSCOPY WITH MYOSURE: SHX6511

## 2017-06-08 HISTORY — DX: Personal history of malignant neoplasm of breast: Z85.3

## 2017-06-08 HISTORY — DX: Diaphragmatic hernia without obstruction or gangrene: K44.9

## 2017-06-08 HISTORY — DX: Diverticulosis of large intestine without perforation or abscess without bleeding: K57.30

## 2017-06-08 HISTORY — DX: Personal history of other diseases of the digestive system: Z87.19

## 2017-06-08 SURGERY — CONE BIOPSY, CERVIX
Anesthesia: General | Site: Vagina

## 2017-06-08 MED ORDER — FENTANYL CITRATE (PF) 100 MCG/2ML IJ SOLN
25.0000 ug | INTRAMUSCULAR | Status: DC | PRN
  Filled 2017-06-08: qty 1

## 2017-06-08 MED ORDER — LIDOCAINE 2% (20 MG/ML) 5 ML SYRINGE
INTRAMUSCULAR | Status: DC | PRN
  Administered 2017-06-08: 100 mg via INTRAVENOUS

## 2017-06-08 MED ORDER — FERRIC SUBSULFATE SOLN
Status: DC | PRN
  Administered 2017-06-08: 1

## 2017-06-08 MED ORDER — DEXAMETHASONE SODIUM PHOSPHATE 10 MG/ML IJ SOLN
INTRAMUSCULAR | Status: AC
Start: 2017-06-08 — End: 2017-06-08
  Filled 2017-06-08: qty 1

## 2017-06-08 MED ORDER — ONDANSETRON HCL 4 MG/2ML IJ SOLN
INTRAMUSCULAR | Status: AC
  Filled 2017-06-08: qty 2

## 2017-06-08 MED ORDER — LACTATED RINGERS IV SOLN
INTRAVENOUS | Status: DC
  Administered 2017-06-08 (×2): via INTRAVENOUS
  Filled 2017-06-08: qty 1000

## 2017-06-08 MED ORDER — MIDAZOLAM HCL 5 MG/5ML IJ SOLN
INTRAMUSCULAR | Status: DC | PRN
  Administered 2017-06-08: 1 mg via INTRAVENOUS

## 2017-06-08 MED ORDER — PROPOFOL 10 MG/ML IV BOLUS
INTRAVENOUS | Status: DC | PRN
  Administered 2017-06-08: 150 mg via INTRAVENOUS

## 2017-06-08 MED ORDER — BUPROPION HCL ER (XL) 150 MG PO TB24: ORAL_TABLET | ORAL | 1 refills | Status: DC

## 2017-06-08 MED ORDER — MIDAZOLAM HCL 2 MG/2ML IJ SOLN
INTRAMUSCULAR | Status: AC
  Filled 2017-06-08: qty 2

## 2017-06-08 MED ORDER — LIDOCAINE 2% (20 MG/ML) 5 ML SYRINGE
INTRAMUSCULAR | Status: AC
  Filled 2017-06-08: qty 5

## 2017-06-08 MED ORDER — KETOROLAC TROMETHAMINE 30 MG/ML IJ SOLN
INTRAMUSCULAR | Status: AC
  Filled 2017-06-08: qty 1

## 2017-06-08 MED ORDER — ONDANSETRON HCL 4 MG/2ML IJ SOLN
INTRAMUSCULAR | Status: DC | PRN
  Administered 2017-06-08: 4 mg via INTRAVENOUS

## 2017-06-08 MED ORDER — CEFOTETAN DISODIUM-DEXTROSE 2-2.08 GM-%(50ML) IV SOLR
INTRAVENOUS | Status: AC
  Filled 2017-06-08: qty 50

## 2017-06-08 MED ORDER — SODIUM CHLORIDE 0.9 % IR SOLN
Status: DC | PRN
  Administered 2017-06-08: 3000 mL

## 2017-06-08 MED ORDER — PROPOFOL 10 MG/ML IV BOLUS
INTRAVENOUS | Status: AC
  Filled 2017-06-08: qty 20

## 2017-06-08 MED ORDER — OXYCODONE-ACETAMINOPHEN 5-325 MG PO TABS: 1.0000 | ORAL_TABLET | ORAL | 0 refills | Status: DC | PRN

## 2017-06-08 MED ORDER — KETOROLAC TROMETHAMINE 30 MG/ML IJ SOLN
INTRAMUSCULAR | Status: DC | PRN
  Administered 2017-06-08: 30 mg via INTRAVENOUS

## 2017-06-08 MED ORDER — DEXTROSE 5 % IV SOLN
2.0000 g | INTRAVENOUS | Status: AC
  Administered 2017-06-08: 2 g via INTRAVENOUS
  Filled 2017-06-08: qty 2

## 2017-06-08 MED ORDER — DEXAMETHASONE SODIUM PHOSPHATE 4 MG/ML IJ SOLN
INTRAMUSCULAR | Status: DC | PRN
  Administered 2017-06-08: 10 mg via INTRAVENOUS

## 2017-06-08 MED ORDER — FENTANYL CITRATE (PF) 100 MCG/2ML IJ SOLN
INTRAMUSCULAR | Status: AC
  Filled 2017-06-08: qty 2

## 2017-06-08 MED ORDER — LIDOCAINE-EPINEPHRINE 2 %-1:100000 IJ SOLN
INTRAMUSCULAR | Status: DC | PRN
  Administered 2017-06-08: 8 mL

## 2017-06-08 MED ORDER — FENTANYL CITRATE (PF) 100 MCG/2ML IJ SOLN
INTRAMUSCULAR | Status: DC | PRN
  Administered 2017-06-08: 100 ug via INTRAVENOUS

## 2017-06-08 SURGICAL SUPPLY — 51 items
APPLICATOR COTTON TIP 6IN STRL (MISCELLANEOUS) IMPLANT
BAG DECANTER FOR FLEXI CONT (MISCELLANEOUS) IMPLANT
BLADE SURG 11 STRL SS (BLADE) ×2 IMPLANT
CANISTER SUCT 3000ML PPV (MISCELLANEOUS) ×2 IMPLANT
CANISTER SUCTION 1200CC (MISCELLANEOUS) IMPLANT
CATH ROBINSON RED A/P 16FR (CATHETERS) ×2 IMPLANT
CLOTH BEACON ORANGE TIMEOUT ST (SAFETY) ×2 IMPLANT
COUNTER NEEDLE 1200 MAGNETIC (NEEDLE) ×2 IMPLANT
DEVICE MYOSURE LITE (MISCELLANEOUS) ×1 IMPLANT
DEVICE MYOSURE REACH (MISCELLANEOUS) IMPLANT
DILATOR CANAL MILEX (MISCELLANEOUS) IMPLANT
ELECT BALL LEEP 3MM BLK (ELECTRODE) IMPLANT
ELECT BALL LEEP 5MM RED (ELECTRODE) ×1 IMPLANT
ELECT LOOP 20X12 DISP (CUTTING LOOP)
ELECT LOOP LEEP RND 15X12 GRN (CUTTING LOOP)
ELECT LOOP LEEP RND 20X12 WHT (CUTTING LOOP)
ELECT NDL TIP 2.8 STRL (NEEDLE) IMPLANT
ELECT NEEDLE TIP 2.8 STRL (NEEDLE) IMPLANT
ELECTRODE LOOP 20X12 DISP (CUTTING LOOP) IMPLANT
ELECTRODE LOOP LP RND 15X12GRN (CUTTING LOOP) IMPLANT
ELECTRODE LOOP LP RND 20X12WHT (CUTTING LOOP) IMPLANT
FILTER ARTHROSCOPY CONVERTOR (FILTER) ×2 IMPLANT
GLOVE BIO SURGEON STRL SZ 6.5 (GLOVE) ×1 IMPLANT
GLOVE BIO SURGEON STRL SZ7.5 (GLOVE) ×4 IMPLANT
GLOVE BIOGEL PI IND STRL 6.5 (GLOVE) IMPLANT
GLOVE BIOGEL PI IND STRL 7.5 (GLOVE) IMPLANT
GLOVE BIOGEL PI INDICATOR 6.5 (GLOVE) ×1
GLOVE BIOGEL PI INDICATOR 7.5 (GLOVE) ×1
GOWN STRL REUS W/TWL LRG LVL3 (GOWN DISPOSABLE) ×3 IMPLANT
GOWN STRL REUS W/TWL XL LVL3 (GOWN DISPOSABLE) ×2 IMPLANT
IV NS IRRIG 3000ML ARTHROMATIC (IV SOLUTION) ×2 IMPLANT
KIT RM TURNOVER CYSTO AR (KITS) ×2 IMPLANT
MYOSURE XL FIBROID REM (MISCELLANEOUS)
NDL SAFETY ECLIPSE 18X1.5 (NEEDLE) IMPLANT
NEEDLE HYPO 18GX1.5 SHARP (NEEDLE)
NS IRRIG 500ML POUR BTL (IV SOLUTION) IMPLANT
PACK VAGINAL MINOR WOMEN LF (CUSTOM PROCEDURE TRAY) ×2 IMPLANT
PACK VAGINAL WOMENS (CUSTOM PROCEDURE TRAY) ×2 IMPLANT
PAD OB MATERNITY 4.3X12.25 (PERSONAL CARE ITEMS) ×2 IMPLANT
SCOPETTES 8  STERILE (MISCELLANEOUS) ×1
SCOPETTES 8 STERILE (MISCELLANEOUS) ×1 IMPLANT
SEAL ROD LENS SCOPE MYOSURE (ABLATOR) ×2 IMPLANT
SUT SILK 2 0 FS (SUTURE) IMPLANT
SUT VIC AB 0 CT1 36 (SUTURE) ×1 IMPLANT
SYRINGE LUER LOK 1CC (MISCELLANEOUS) IMPLANT
SYSTEM TISS REMOVAL MYSR XL RM (MISCELLANEOUS) IMPLANT
TOWEL OR 17X24 6PK STRL BLUE (TOWEL DISPOSABLE) ×4 IMPLANT
TUBING AQUILEX INFLOW (TUBING) ×2 IMPLANT
TUBING AQUILEX OUTFLOW (TUBING) ×2 IMPLANT
VACUUM HOSE/TUBING 7/8INX6FT (MISCELLANEOUS) IMPLANT
WATER STERILE IRR 500ML POUR (IV SOLUTION) IMPLANT

## 2017-06-08 NOTE — Op Note (Signed)
Late entry: The patient was evaluated preoperatively and was examined.  I reviewed the proposed surgery and consent form with the patient and her brother.  The dictated history and physical is current and accurate and all questions were answered. The patient is ready to proceed with surgery and has a realistic understanding and expectation for the outcome.   Anastasio Auerbach MD, 9:37 AM 06/08/2017

## 2017-06-08 NOTE — Discharge Instructions (Signed)
° °  Postoperative Instructions Hysteroscopy D & C ° °Dr. Fontaine and the nursing staff have discussed postoperative instructions with you.  If you have any questions please ask them before you leave the hospital, or call Dr Fontaine’s office at [phone].   ° °We would like to emphasize the following instructions: ° ° °? Call the office to make your follow-up appointment as recommended by Dr Fontaine (usually 1-2 weeks). ° °? You were given a prescription, or one was ordered for you at the pharmacy you designated.  Get that prescription filled and take the medication according to instructions. ° °? You may eat a regular diet, but slowly until you start having bowel movements. ° °? Drink plenty of water daily. ° °? Nothing in the vagina (intercourse, douching, objects of any kind) for two weeks.  When reinitiating intercourse, if it is uncomfortable, stop and make an appointment with Dr Fontaine to be evaluated. ° °? No driving for one to two days until the effects of anesthesia has worn off.  No traveling out of town for several days. ° °? You may shower, but no baths for one week.  Walking up and down stairs is ok.  No heavy lifting, prolonged standing, repeated bending or any “working out” until your post op check. ° °? Rest frequently, listen to your body and do not push yourself and overdo it. ° °? Call if: ° °o Your pain medication does not seem strong enough. °o Worsening pain or abdominal bloating °o Persistent nausea or vomiting °o Difficulty with urination or bowel movements. °o Temperature of 101 degrees or higher. °o Heavy vaginal bleeding.  If your period is due, you may use tampons. °o You have any questions or concerns ° ° °Post Anesthesia Home Care Instructions ° °Activity: °Get plenty of rest for the remainder of the day. A responsible individual must stay with you for 24 hours following the procedure.  °For the next 24 hours, DO NOT: °-Drive a car °-Operate machinery °-Drink alcoholic  beverages °-Take any medication unless instructed by your physician °-Make any legal decisions or sign important papers. ° °Meals: °Start with liquid foods such as gelatin or soup. Progress to regular foods as tolerated. Avoid greasy, spicy, heavy foods. If nausea and/or vomiting occur, drink only clear liquids until the nausea and/or vomiting subsides. Call your physician if vomiting continues. ° °Special Instructions/Symptoms: °Your throat may feel dry or sore from the anesthesia or the breathing tube placed in your throat during surgery. If this causes discomfort, gargle with warm salt water. The discomfort should disappear within 24 hours. ° °If you had a scopolamine patch placed behind your ear for the management of post- operative nausea and/or vomiting: ° °1. The medication in the patch is effective for 72 hours, after which it should be removed.  Wrap patch in a tissue and discard in the trash. Wash hands thoroughly with soap and water. °2. You may remove the patch earlier than 72 hours if you experience unpleasant side effects which may include dry mouth, dizziness or visual disturbances. °3. Avoid touching the patch. Wash your hands with soap and water after contact with the patch. °   ° ° ° °

## 2017-06-08 NOTE — Anesthesia Postprocedure Evaluation (Signed)
Anesthesia Post Note  Patient: Sherry Anderson  Procedure(s) Performed: CONIZATION CERVIX WITH BIOPSY (N/A Vagina ) DILATATION & CURETTAGE/HYSTEROSCOPY WITH MYOSURE (N/A Vagina )     Patient location during evaluation: PACU Anesthesia Type: General Level of consciousness: awake and alert Pain management: pain level controlled Vital Signs Assessment: post-procedure vital signs reviewed and stable Respiratory status: spontaneous breathing, nonlabored ventilation, respiratory function stable and patient connected to nasal cannula oxygen Cardiovascular status: blood pressure returned to baseline and stable Postop Assessment: no apparent nausea or vomiting Anesthetic complications: no    Last Vitals:  Vitals:   06/08/17 0930 06/08/17 0945  BP: 120/71 131/65  Pulse: 65 67  Resp: 14 13  Temp:    SpO2: 100% 98%    Last Pain:  Vitals:   06/08/17 0915  TempSrc:   PainSc: 0-No pain                 Yoan Sallade EDWARD

## 2017-06-08 NOTE — Anesthesia Procedure Notes (Signed)
Procedure Name: LMA Insertion Date/Time: 06/08/2017 7:28 AM Performed by: Lyndee Leo Pre-anesthesia Checklist: Patient identified, Emergency Drugs available, Suction available and Patient being monitored Patient Re-evaluated:Patient Re-evaluated prior to induction Oxygen Delivery Method: Circle system utilized Preoxygenation: Pre-oxygenation with 100% oxygen Induction Type: IV induction Ventilation: Mask ventilation without difficulty LMA: LMA inserted LMA Size: 4.0 Number of attempts: 1 Airway Equipment and Method: Bite block Placement Confirmation: positive ETCO2 Tube secured with: Tape Dental Injury: Teeth and Oropharynx as per pre-operative assessment

## 2017-06-08 NOTE — Op Note (Signed)
Sherry Anderson 11/08/1960 [id number]   Post Operative Note   Date of surgery:  06/08/2017  Pre Op Dx: High-grade cervical dysplasia, atypical glandular cells  Post Op Dx: High-grade cervical dysplasia, atypical glandular cells, endometrial polyp  Procedure: Cold knife cone biopsy of the cervix, ECC, EMC, hysteroscopy with Myosure resection endometrial polyp  Surgeon:  Donalynn Furlong P  Anesthesia:  General  EBL: 10 cc  Distended media discrepancy: 950 cc saline  Complications:  None  Specimen: #1 cone biopsy suture at 12:00, cut at 3:00 #2 endocervical curetting #3 endometrial curetting #4 endometrial polyp to pathology  Findings: EUA: External BUS vagina with atrophic changes.  Cervix grossly normal.  Uterus normal size midline mobile.  Adnexa without masses   Hysteroscopy: Adequate noting fundus, right/left tubal ostia, anterior/posterior endometrial surfaces, lower uterine segment and endocervical canal all visualized.  Classic appearing endometrial polyp from lower posterior endometrial surface resected in its entirety  Procedure:  The patient was taken to the operating room, was placed in the low dorsal lithotomy position, underwent general anesthesia, received a perineal/vaginal preparation with Betadine solution and the bladder was emptied with in and out Foley catheterization. The timeout was performed by the surgical team. An EUA was performed. The patient was draped in the usual fashion. The cervix was visualized with a speculum and the cervical mucosa was circumferentially injected using 2% Xylocaine with epinephrine dilution, 8 cc total used.  The cervical mucosa was then circumferentially incised using a scalpel to obtain a good lateral cone sampling of the ectocervix and the cone specimen was sharply excised and a conical fashion.  A suture was placed at the 12 o'clock position for identification and was cut open at the 3 o'clock position and pinned open for fixation.   The single-tooth tenaculum was then placed on the anterior cervix and an ECC was then performed.  The cervix was then gently dilated to admit the Myosure hysteroscope.  Hysteroscopy was performed with findings noted above.  Using the Myosure Lite resectoscopic wand the polyp was resected in it's entirety to the level the surrounding endometrium. A gentle sharp curettage was performed.  Repeat hysteroscopy showed an empty cavity with good distention and no evidence of perforation.  Several small bleeding points at the cone base were cauterized using the ball coagulator and prophylactic Monsel's was placed with no bleeding noted.  The instruments were removed and adequate hemostasis was visualized at the tenaculum site and external cervical os. The patient was given intraoperative Toradol, was awakened without difficulty and was taken to the recovery room in good condition having tolerated the procedure well.    Anastasio Auerbach MD, 9:38 AM 06/08/2017

## 2017-06-08 NOTE — Transfer of Care (Signed)
Immediate Anesthesia Transfer of Care Note  Patient: Sherry Anderson  Procedure(s) Performed: CONIZATION CERVIX WITH BIOPSY (N/A Vagina ) DILATATION & CURETTAGE/HYSTEROSCOPY WITH MYOSURE (N/A Vagina )  Patient Location: PACU  Anesthesia Type:General  Level of Consciousness: awake, sedated and patient cooperative  Airway & Oxygen Therapy: Patient Spontanous Breathing and Patient connected to face mask oxygen  Post-op Assessment: Report given to RN and Post -op Vital signs reviewed and stable  Post vital signs: Reviewed and stable  Last Vitals:  Vitals:   06/08/17 0600 06/08/17 0816  BP: 125/69 (!) 149/76  Pulse: 65 77  Resp: 18 13  Temp: 37 C 36.7 C  SpO2: 100% 99%    Last Pain:  Vitals:   06/08/17 0600  TempSrc: Oral      Patients Stated Pain Goal: 5 (62/86/38 1771)  Complications: No apparent anesthesia complications

## 2017-06-09 ENCOUNTER — Encounter (HOSPITAL_BASED_OUTPATIENT_CLINIC_OR_DEPARTMENT_OTHER): Payer: Self-pay | Admitting: Gynecology

## 2017-06-14 ENCOUNTER — Telehealth: Payer: Self-pay

## 2017-06-14 NOTE — Telephone Encounter (Signed)
Patient called to check on biopsy results because Dr. Loetta Rough had indicated to her she would hear by now. I explained that Dr. Loetta Rough had me call this morning to check on the result and the pathologist is still working on it and hopefully to sign off on it today. I assured patient we will call her as soon as we get result and Dr. Loetta Rough reviews it.

## 2017-06-20 ENCOUNTER — Other Ambulatory Visit: Payer: Self-pay | Admitting: Family Medicine

## 2017-06-22 ENCOUNTER — Ambulatory Visit (INDEPENDENT_AMBULATORY_CARE_PROVIDER_SITE_OTHER): Payer: Commercial Managed Care - PPO | Admitting: Gynecology

## 2017-06-22 ENCOUNTER — Encounter: Payer: Self-pay | Admitting: Gynecology

## 2017-06-22 ENCOUNTER — Telehealth: Payer: Self-pay | Admitting: *Deleted

## 2017-06-22 VITALS — BP 124/74

## 2017-06-22 DIAGNOSIS — Z01818 Encounter for other preprocedural examination: Secondary | ICD-10-CM

## 2017-06-22 DIAGNOSIS — D069 Carcinoma in situ of cervix, unspecified: Secondary | ICD-10-CM

## 2017-06-22 NOTE — Telephone Encounter (Signed)
Referral placed at [facility] location, they will contact pt to schedule.

## 2017-06-22 NOTE — Progress Notes (Addendum)
    Ellouise Mcwhirter Jun 24, 1961 [id number]        56 y.o.  G2P0 presents for her postoperative visit status post cone biopsy D&C.  Is done well since then without complaints.  Final pathology did show high-grade squamous dysplasia moderate to severe, adenocarcinoma in situ and benign endocervical polyp.  Margins were clear.  ECC did show dysplastic fragments which was felt to be low-grade.  Endometrial curetting showed atrophic endometrium  Past medical history,surgical history, problem list, medications, allergies, family history and social history were all reviewed and documented in the EPIC chart.  Directed ROS with pertinent positives and negatives documented in the history of present illness/assessment and plan.  Exam: Caryn Bee assistant Vitals:   06/22/17 1031  BP: 124/74   General appearance:  Normal Abdomen soft nontender without masses guarding rebound Pelvic external BUS vagina with atrophic changes.  Cervix healing from the cone.  Uterus grossly normal midline mobile nontender.  Adnexa without masses or tenderness.  Assessment/Plan:  56 y.o. G2P0 with high-grade dysplasia of the cervix and adenocarcinoma in situ.  Margins were clear although ECC showed low-grade dysplasia.  Options for management were reviewed with patient to include expectant management with frequent Pap smear surveillance versus proceeding with hysterectomy given the adenocarcinoma in situ and higher risk of recurrence.  Even her total picture my recommendation was to proceed with hysterectomy as an elective procedure after healing of her cone biopsy.  Various approaches to include vaginal, LAVH, robotic and TAH were discussed in general.  Her uterus is well supported.  I recommended she represent after complete healing in 2 months to allow for reexamination and determination as to the best route.  The patient agrees with this feeling very nervous about not proceeding with hysterectomy.  I also reviewed her  preoperative EKG which showed possible old infarct.  She has no history of cardiac issues in the past.  I recommended she follow-up with cardiology preoperatively for her hysterectomy to let them evaluate her and if any further testing needed preoperatively to clear her from a cardiac standpoint and she agrees with this.  Portance of follow-up and potential for recurrence of her adenocarcinoma stressed.    Anastasio Auerbach MD, 11:16 AM 06/22/2017

## 2017-06-22 NOTE — Telephone Encounter (Signed)
-----   Message from Anastasio Auerbach, MD sent at 06/22/2017 10:54 AM EST ----- Patient needs appointment with cardiology reference preoperative EKG showed evidence of old infarct.  No history of cardiac issues in the past.  For planned hysterectomy over the next several months.  Request preoperative clearance for upcoming hysterectomy

## 2017-06-22 NOTE — Progress Notes (Signed)
Cardiology Office Note   Date:  06/23/2017   ID:  Sherry Anderson, DOB 1960-11-19, MRN [id number]  PCP:  Leeanne Rio, MD  Cardiologist:   Minus Breeding, MD  Referring:  Anastasio Auerbach, MD   Chief Complaint  Patient presents with  . Chest Pain      History of Present Illness: Sherry Anderson is a 56 y.o. female who is referred by Fontaine, Belinda Block, MD for evaluation of an abnormal EKG.  She is being considered for hysterectomy.  She had an abnormal EKG.   There was a questionable inferior infarct.  She has no past cardiac history.  She is going to have oophorectomy and possible hysterectomy.  This is not scheduled but might happen in a few months.  She has not had any prior cardiac workup.  She has a very physical job.  She says she does tire easily.  She has excessive sweating.  She does get short of breath at times with this.  She might have some chest pressure.  It is very difficult for her to quantify or qualify this.  It is not necessarily happening that she gets discomfort every time with exertion.  It seems to be sporadic.  Seems to be mild.  There is no chest pressure, neck or arm discomfort.   Past Medical History:  Diagnosis Date  . Adenocarcinoma in situ (AIS) of uterine cervix 05/2017   Associated with high-grade dysplasia  . Anxiety and depression   . Diverticulosis of colon   . Essential hypertension, benign   . GERD (gastroesophageal reflux disease)   . Hiatal hernia   . High grade squamous intraepithelial cervical dysplasia 05/2017   Associated with adenocarcinoma in situ  . History of adenomatous polyp of colon    tubular adenoma's  02-08-2017  . History of gastritis   . History of left breast cancer 2002---- per pt no recurrence   dx DCIS --- s/p  left breast lumpectomy;  per pt radiation therapy completed same year and completed antiestrogen therapy   . History of radiation therapy    2002   left breast    Past Surgical History:    Procedure Laterality Date  . BREAST LUMPECTOMY  2002   ductal CA in situ  . CESAREAN SECTION  1990  . COLONOSCOPY  02-08-2017   dr Karleen Hampshire  . ESOPHAGOGASTRODUODENOSCOPY  last one 11-24-2016   dr Karleen Hampshire     Current Outpatient Medications  Medication Sig Dispense Refill  . baclofen (LIORESAL) 10 MG tablet TAKE 1/2 TABLET 3 TIMES A DAY AS NEEDED FOR MUSCLE SPASM 30 tablet 0  . buPROPion (WELLBUTRIN XL) 150 MG 24 hr tablet TAKE 1 TABLET BY MOUTH EVERY DAY--- takes in am 30 tablet 1  . Calcium Carbonate-Vitamin D (CALTRATE 600+D) 600-400 MG-UNIT tablet Take 1 tablet by mouth 2 (two) times daily. 180 tablet 2  . lisinopril (PRINIVIL,ZESTRIL) 20 MG tablet Take 1 tablet (20 mg total) by mouth daily. (Patient taking differently: Take 20 mg by mouth every morning. ) 90 tablet 3  . omeprazole (PRILOSEC) 20 MG capsule Take 1 capsule (20 mg total) by mouth 2 (two) times daily before a meal. (Patient taking differently: Take 20 mg by mouth 2 (two) times daily before a meal. ) 60 capsule 3  . oxyCODONE-acetaminophen (ROXICET) 5-325 MG tablet Take 1 tablet by mouth every 4 (four) hours as needed for severe pain. 5 tablet 0  . triamterene-hydrochlorothiazide (DYAZIDE) 37.5-25 MG capsule Take 1 each (  1 capsule total) by mouth daily. (Patient taking differently: Take 1 capsule by mouth every morning. ) 90 capsule 3  . trolamine salicylate (ASPERCREME/ALOE) 10 % cream Apply 1 application topically as needed for muscle pain. 85 g 0  . venlafaxine XR (EFFEXOR-XR) 75 MG 24 hr capsule TAKE 3 CAPSULES EVERY DAY (Patient taking differently: Take 75 mg by mouth every morning. TAKE 3 CAPSULES EVERY DAY) 90 capsule 1   No current facility-administered medications for this visit.     Allergies:   Patient has no known allergies.    Social History:  The patient  reports that  has never smoked. she has never used smokeless tobacco. She reports that she does not drink alcohol or use drugs.   Family History:  The  patient's family history includes Diabetes in her mother; Heart disease in her father; Hypertension in her mother; Schizophrenia in her brother; Thyroid disease in her sister.    ROS:  Please see the history of present illness.   Otherwise, review of systems are positive for none.   All other systems are reviewed and negative.    PHYSICAL EXAM: VS:  BP 118/70   Pulse 88   Ht 5\' 3"  (1.6 m)   Wt 152 lb 3.2 oz (69 kg)   SpO2 98%   BMI 26.96 kg/m  , BMI Body mass index is 26.96 kg/m. GENERAL:  Well appearing HEENT:  Pupils equal round and reactive, fundi not visualized, oral mucosa unremarkable NECK:  No jugular venous distention, waveform within normal limits, carotid upstroke brisk and symmetric, no bruits, no thyromegaly LYMPHATICS:  No cervical, inguinal adenopathy LUNGS:  Clear to auscultation bilaterally BACK:  No CVA tenderness CHEST:   Left partial mastectomy.   HEART:  PMI not displaced or sustained,S1 and S2 within normal limits, no S3, no S4, no clicks, no rubs, no murmurs ABD:  Flat, positive bowel sounds normal in frequency in pitch, no bruits, no rebound, no guarding, no midline pulsatile mass, no hepatomegaly, no splenomegaly EXT:  2 plus pulses throughout, no edema, no cyanosis no clubbing SKIN:  No rashes no nodules NEURO:  Cranial nerves II through XII grossly intact, motor grossly intact throughout PSYCH:  Cognitively intact, oriented to person place and time    EKG:  EKG is not ordered today. The ekg ordered 06/08/17 demonstrates sinus rhythm, rate 66, axis within normal limits, intervals within normal limits, poor anterior R wave progression, no acute ST-T wave changes.   Recent Labs: 06/03/2017: ALT 20; BUN 17; Creatinine, Ser 0.96; Hemoglobin 11.6; Platelets 262; Potassium 3.6; Sodium 140    Lipid Panel    Component Value Date/Time   CHOL 234 (H) 07/27/2016 1001   TRIG 119 07/27/2016 1001   HDL 65 07/27/2016 1001   CHOLHDL 3.6 07/27/2016 1001   VLDL 24  07/27/2016 1001   LDLCALC 145 (H) 07/27/2016 1001   LDLDIRECT 157 (H) 08/20/2008 2054      Wt Readings from Last 3 Encounters:  06/23/17 152 lb 3.2 oz (69 kg)  06/08/17 153 lb 5 oz (69.5 kg)  05/12/17 154 lb (69.9 kg)      Other studies Reviewed: Additional studies/ records that were reviewed today include: Office records Ob/Gyn. Review of the above records demonstrates:  Please see elsewhere in the note.     ASSESSMENT AND PLAN:   ABNORMAL EKG: Patient's EKG does not seem to represent old inferior anterior microinfarction.  There is poor anterior R wave progression.  The inferior leads did  not represent pathologic Q waves.  I think the computer interpretation is incorrect.  However, she does have some risk factors, dyspnea and some chest discomfort.  I think the pretest probability of obstructive coronary disease low.  However, screening with POET (Plain Old Exercise Treadmill) is indicated.  This would serve as preoperative clearance as well.  HTN:  The blood pressure is at target. No change in medications is indicated. We will continue with therapeutic lifestyle changes (TLC).    Current medicines are reviewed at length with the patient today.  The patient does not have concerns regarding medicines.  The following changes have been made:  no change  Labs/ tests ordered today include:   Orders Placed This Encounter  Procedures  . Exercise Tolerance Test     Disposition:   FU with me as needed.      Signed, Minus Breeding, MD  06/23/2017 2:17 PM    [facility]

## 2017-06-22 NOTE — Telephone Encounter (Signed)
Pt scheduled on 06/23/17 with Dr.Hochrein

## 2017-06-22 NOTE — Patient Instructions (Signed)
Follow up in 2 months for reexamination  Office will call to arrange cardiology appointment

## 2017-06-23 ENCOUNTER — Encounter: Payer: Self-pay | Admitting: Cardiology

## 2017-06-23 ENCOUNTER — Ambulatory Visit: Payer: Commercial Managed Care - PPO | Admitting: Cardiology

## 2017-06-23 VITALS — BP 118/70 | HR 88 | Ht 63.0 in | Wt 152.2 lb

## 2017-06-23 DIAGNOSIS — R9431 Abnormal electrocardiogram [ECG] [EKG]: Secondary | ICD-10-CM | POA: Diagnosis not present

## 2017-06-23 DIAGNOSIS — R0602 Shortness of breath: Secondary | ICD-10-CM

## 2017-06-23 DIAGNOSIS — R079 Chest pain, unspecified: Secondary | ICD-10-CM

## 2017-06-23 NOTE — Patient Instructions (Signed)
Medication Instructions:  Continue current medications  If you need a refill on your cardiac medications before your next appointment, please call your pharmacy.  Labwork: None Ordered   Testing/Procedures: Your physician has requested that you have an exercise tolerance test. For further information please visit [email or web address]. Please also follow instruction sheet, as given.   Follow-Up: Your physician wants you to follow-up in: As Needed.     Thank you for choosing [facility]!!       

## 2017-07-01 ENCOUNTER — Other Ambulatory Visit: Payer: Self-pay | Admitting: Family Medicine

## 2017-07-06 ENCOUNTER — Telehealth (HOSPITAL_COMMUNITY): Payer: Self-pay

## 2017-07-06 NOTE — Telephone Encounter (Signed)
UTR 

## 2017-07-09 ENCOUNTER — Other Ambulatory Visit: Payer: Self-pay | Admitting: Family Medicine

## 2017-07-12 ENCOUNTER — Ambulatory Visit (HOSPITAL_COMMUNITY)
Admission: RE | Admit: 2017-07-12 | Discharge: 2017-07-12 | Disposition: A | Discharge status: Home / Self Care | Payer: Commercial Managed Care - PPO | Source: Ambulatory Visit | Attending: Cardiology | Admitting: Cardiology

## 2017-07-12 DIAGNOSIS — R079 Chest pain, unspecified: Secondary | ICD-10-CM | POA: Diagnosis not present

## 2017-07-12 LAB — EXERCISE TOLERANCE TEST
CHL CUP MPHR: 163 {beats}/min
CSEPEDS: 19 s
CSEPHR: 106 %
CSEPPHR: 173 {beats}/min
Estimated workload: 9 METS
Exercise duration (min): 7 min
RPE: 17
Rest HR: 64 {beats}/min

## 2017-07-13 ENCOUNTER — Ambulatory Visit: Payer: Commercial Managed Care - PPO | Admitting: Neurology

## 2017-07-15 ENCOUNTER — Telehealth: Payer: Self-pay

## 2017-07-15 NOTE — Telephone Encounter (Signed)
Informed patient. I read her Dr. Dorette Grate reply and explained it to her. She understood. She will schedule appt to come in and be examined for hysterectomy in the New Year.

## 2017-07-15 NOTE — Telephone Encounter (Signed)
Patient called in voice mail stating her heart studies were fine and hopefully you can see results.  She said that she would like to schedule surgery in December.  She said she has met her $3500.00 deductible and if she waits until the new year she will have to pay it again and she does not have the money to pay that again.   I have 3 hours on 07/25/17 where we could schedule Vag Hyst or TAH. No Robotic time left until February.  I did see your office note. Please advise what to rec. To her.

## 2017-07-15 NOTE — Telephone Encounter (Signed)
She needs to allow her cervix to heal.  After a cone biopsy we do not recommend proceeding with hysterectomy for roughly 2 months.  I do not see this happening in December.  She needs to be reexamined after everything heals to determine what is the best route for her surgery and I suspect that it may be robotic which would then have to be scheduled regardless.  So she will need a office visit to determine the best route but being that she had her cone the end of October I think the December date would be a little too early.  There is increased risks of doing hysterectomy is too close to a cone biopsy.  You really need to let everything heal.

## 2017-07-31 ENCOUNTER — Other Ambulatory Visit: Payer: Self-pay | Admitting: Family Medicine

## 2017-08-08 ENCOUNTER — Other Ambulatory Visit: Payer: Self-pay | Admitting: Family Medicine

## 2017-08-23 ENCOUNTER — Encounter: Payer: Self-pay | Admitting: Gynecology

## 2017-08-23 ENCOUNTER — Ambulatory Visit: Payer: Commercial Managed Care - PPO | Admitting: Gynecology

## 2017-08-23 VITALS — BP 118/76

## 2017-08-23 DIAGNOSIS — R87613 High grade squamous intraepithelial lesion on cytologic smear of cervix (HGSIL): Secondary | ICD-10-CM

## 2017-08-23 DIAGNOSIS — D069 Carcinoma in situ of cervix, unspecified: Secondary | ICD-10-CM

## 2017-08-23 NOTE — Patient Instructions (Signed)
Office will call you to arrange for the surgery 

## 2017-08-23 NOTE — Progress Notes (Signed)
    Jennell Janosik February 17, 1961 [id number]        57 y.o.  G2P0 presents to discuss long-term management of her HGSIL and adenocarcinoma in situ of the endocervix.  Patient underwent cone biopsy in follow-up of Pap smear showing atypical glandular cells and biopsy showing high-grade dysplasia.  Cone biopsy results showed HGSIL with adenocarcinoma in situ of the endocervix with margins free as well as a benign endometrial polyp.  Past medical history,surgical history, problem list, medications, allergies, family history and social history were all reviewed and documented in the EPIC chart.  Directed ROS with pertinent positives and negatives documented in the history of present illness/assessment and plan.  Exam: Caryn Bee assistant Vitals:   08/23/17 0953  BP: 118/76   General appearance:  Normal Abdomen soft nontender without masses guarding rebound Pelvic external BUS vagina with atrophic changes.  Cervix with some scarring but cervical loss visible and outline of the cervix also visible.  Uterus grossly normal size midline mobile nontender.  Adnexa without masses or tenderness  Assessment/Plan:  57 y.o. G2P0 with history of high-grade dysplasia, adenocarcinoma in situ of the endocervix, positive high risk HPV with recent cone biopsy showing clear margins.  I reviewed the risks of recurrence particularly of the adenocarcinoma in situ and the options for management to include ongoing expectant management with close follow-up Pap smears/ECCs of the endocervix versus proceeding with hysterectomy for definitive treatment.  She understands the HPV relationship and that this does not guarantee she will not have dysplastic changes of the vagina postoperatively dressed the adenocarcinoma issue of the endocervix.  I also reviewed the issues of ovarian conservation in a 57 year old woman and the options to remove both ovaries versus keeping both ovaries for what ever hormonal contribution risks of  ovarian disease up to and including ovarian cancer discussed with ovarian conservation.  The patient's cervix is well supported high in the vagina and I do not feel technically I would be able to do an LAVH.  She has had 2 pregnancies with one vaginal delivery and one cesarean section for twins.  Her options would be TAH or robotic hysterectomy.  At this point the patient feels strongly that she wants to proceed with hysterectomy and is willing to proceed with robotic hysterectomy.  She understands that my partner is an expert in this technique and I will refer her to Dr Dellis Filbert for evaluation and the robotic hysterectomy.  Patient also wants to proceed with bilateral salpingo-oophorectomies at that time.  We will go ahead and schedule her for surgery and she will have a preoperative evaluation by Dr Dellis Filbert.    Anastasio Auerbach MD, 10:33 AM 08/23/2017

## 2017-08-29 ENCOUNTER — Other Ambulatory Visit: Payer: Self-pay | Admitting: Family Medicine

## 2017-08-30 ENCOUNTER — Encounter: Payer: Self-pay | Admitting: Anesthesiology

## 2017-08-31 NOTE — Patient Instructions (Addendum)
Your procedure is scheduled on:  Wednesday, Feb 6  Enter through the [employer] [facility] at: 6 am  Pick up the phone at the desk and dial [phone].  Call this number if you have problems the morning of surgery: [phone].  Remember: Do NOT eat or Do NOT drink clear liquids (including water) after midnight Tuesday  Take these medicines the morning of surgery with a SIP OF WATER: prilosec, triamterene-hctz and effexor  Stop herbal medications and supplements 1 week prior to surgery.  Do NOT wear jewelry (body piercing), metal hair clips/bobby pins, make-up, or nail polish. Do NOT wear lotions, powders, or perfumes.  You may wear deoderant. Do NOT shave for 48 hours prior to surgery. Do NOT bring valuables to the hospital. Dentures/partials may not be worn into surgery.  Leave suitcase in car.  After surgery it may be brought to your room.  For patients admitted to the hospital, checkout time is 11:00 AM the day of discharge. Have a responsible adult drive you home and stay with you for 24 hours after your procedure.  Home with [facility] cell [phone]

## 2017-09-01 MED ORDER — BACLOFEN 10 MG PO TABS: ORAL_TABLET | ORAL | 0 refills | Status: DC

## 2017-09-01 MED ORDER — OMEPRAZOLE 20 MG PO CPDR: 20.0000 mg | DELAYED_RELEASE_CAPSULE | Freq: Two times a day (BID) | ORAL | 0 refills | Status: DC

## 2017-09-01 MED ORDER — BUPROPION HCL ER (XL) 150 MG PO TB24: ORAL_TABLET | ORAL | 0 refills | Status: DC

## 2017-09-01 NOTE — Telephone Encounter (Signed)
Please ask patient to schedule appointment with me, it has been quite a while since I've seen her. I will send in a month supply of her medications  Thanks Leeanne Rio, MD

## 2017-09-05 NOTE — Telephone Encounter (Signed)
Attempted to call pt, couldn't leave message because voicemail full. Sherry Anderson Kennon Holter, CMA

## 2017-09-06 ENCOUNTER — Ambulatory Visit: Payer: Commercial Managed Care - PPO | Admitting: Obstetrics & Gynecology

## 2017-09-07 ENCOUNTER — Encounter (HOSPITAL_COMMUNITY)
Admission: RE | Admit: 2017-09-07 | Discharge: 2017-09-07 | Disposition: A | Discharge status: Home / Self Care | Payer: Commercial Managed Care - PPO | Source: Ambulatory Visit | Attending: Obstetrics & Gynecology | Admitting: Obstetrics & Gynecology

## 2017-09-07 ENCOUNTER — Other Ambulatory Visit: Payer: Self-pay

## 2017-09-07 ENCOUNTER — Encounter (HOSPITAL_COMMUNITY): Payer: Self-pay

## 2017-09-07 DIAGNOSIS — Z01812 Encounter for preprocedural laboratory examination: Secondary | ICD-10-CM | POA: Insufficient documentation

## 2017-09-07 DIAGNOSIS — Z0183 Encounter for blood typing: Secondary | ICD-10-CM | POA: Diagnosis not present

## 2017-09-07 HISTORY — DX: Major depressive disorder, single episode, unspecified: F32.9

## 2017-09-07 HISTORY — DX: Depression, unspecified: F32.A

## 2017-09-07 HISTORY — DX: Malignant (primary) neoplasm, unspecified: C80.1

## 2017-09-07 LAB — BASIC METABOLIC PANEL
Anion gap: 10 (ref 5–15)
BUN: 19 mg/dL (ref 6–20)
CALCIUM: 9.3 mg/dL (ref 8.9–10.3)
CO2: 23 mmol/L (ref 22–32)
CREATININE: 1 mg/dL (ref 0.44–1.00)
Chloride: 105 mmol/L (ref 101–111)
GFR calc Af Amer: >60 mL/min (ref >60)
GFR calc non Af Amer: >60 mL/min (ref >60)
Glucose, Bld: 105 mg/dL — ABNORMAL HIGH (ref 65–99)
POTASSIUM: 3.4 mmol/L — AB (ref 3.5–5.1)
SODIUM: 138 mmol/L (ref 135–145)

## 2017-09-07 LAB — CBC
HCT: 36.5 % (ref 36.0–46.0)
HEMOGLOBIN: 12.5 g/dL (ref 12.0–15.0)
MCH: 30.9 pg (ref 26.0–34.0)
MCHC: 34.2 g/dL (ref 30.0–36.0)
MCV: 90.3 fL (ref 78.0–100.0)
Platelets: 274 10*3/uL (ref 150–400)
RBC: 4.04 MIL/uL (ref 3.87–5.11)
RDW: 12.7 % (ref 11.5–15.5)
WBC: 5.2 10*3/uL (ref 4.0–10.5)

## 2017-09-07 LAB — TYPE AND SCREEN
ABO/RH(D): A POS
Antibody Screen: NEGATIVE

## 2017-09-07 LAB — ABO/RH: ABO/RH(D): A POS

## 2017-09-14 ENCOUNTER — Ambulatory Visit: Payer: Commercial Managed Care - PPO | Admitting: Obstetrics & Gynecology

## 2017-09-14 NOTE — Progress Notes (Signed)
Stress test 07-12-17 epic  lov cardiology D Hochrein 06-23-17 epic  ekg 06-08-17 epic  BMP, CBC 09-07-17 epic

## 2017-09-14 NOTE — Patient Instructions (Signed)
Sherry Anderson  09/14/2017   Your procedure is scheduled on: 09-21-17   Report to [facility] Main  Entrance    Report to admitting at 6:30AM  Call this number if you have problems the morning of surgery [phone]   Remember: Do not eat food or drink liquids :After Midnight.     Take these medicines the morning of surgery with A SIP OF WATER: bupropion, omeprazole, venlafaxine                                 You may not have any metal on your body including hair pins and              piercings  Do not wear jewelry, make-up, lotions, powders or perfumes, deodorant             Do not wear nail polish.  Do not shave  48 hours prior to surgery.              Do not bring valuables to the hospital. [facility].  Contacts, dentures or bridgework may not be worn into surgery.  Leave suitcase in the car. After surgery it may be brought to your room.                 Please read over the following fact sheets you were given: _____________________________________________________________________             [facility] - Preparing for Surgery Before surgery, you can play an important role.  Because skin is not sterile, your skin needs to be as free of germs as possible.  You can reduce the number of germs on your skin by washing with CHG (chlorahexidine gluconate) soap before surgery.  CHG is an antiseptic cleaner which kills germs and bonds with the skin to continue killing germs even after washing. Please DO NOT use if you have an allergy to CHG or antibacterial soaps.  If your skin becomes reddened/irritated stop using the CHG and inform your nurse when you arrive at Short Stay. Do not shave (including legs and underarms) for at least 48 hours prior to the first CHG shower.  You may shave your face/neck. Please follow these instructions carefully:  1.  Shower with CHG Soap the night before surgery and the   morning of Surgery.  2.  If you choose to wash your hair, wash your hair first as usual with your  normal  shampoo.  3.  After you shampoo, rinse your hair and body thoroughly to remove the  shampoo.                           4.  Use CHG as you would any other liquid soap.  You can apply chg directly  to the skin and wash                       Gently with a scrungie or clean washcloth.  5.  Apply the CHG Soap to your body ONLY FROM THE NECK DOWN.   Do not use on face/ open  Wound or open sores. Avoid contact with eyes, ears mouth and genitals (private parts).                       Wash face,  Genitals (private parts) with your normal soap.             6.  Wash thoroughly, paying special attention to the area where your surgery  will be performed.  7.  Thoroughly rinse your body with warm water from the neck down.  8.  DO NOT shower/wash with your normal soap after using and rinsing off  the CHG Soap.                9.  Pat yourself dry with a clean towel.            10.  Wear clean pajamas.            11.  Place clean sheets on your bed the night of your first shower and do not  sleep with pets. Day of Surgery : Do not apply any lotions/deodorants the morning of surgery.  Please wear clean clothes to the hospital/surgery center.  FAILURE TO FOLLOW THESE INSTRUCTIONS MAY RESULT IN THE CANCELLATION OF YOUR SURGERY PATIENT SIGNATURE_________________________________  NURSE SIGNATURE__________________________________  ________________________________________________________________________   Adam Phenix  An incentive spirometer is a tool that can help keep your lungs clear and active. This tool measures how well you are filling your lungs with each breath. Taking long deep breaths may help reverse or decrease the chance of developing breathing (pulmonary) problems (especially infection) following:  A long period of time when you are unable to move or be  active. BEFORE THE PROCEDURE   If the spirometer includes an indicator to show your best effort, your nurse or respiratory therapist will set it to a desired goal.  If possible, sit up straight or lean slightly forward. Try not to slouch.  Hold the incentive spirometer in an upright position. INSTRUCTIONS FOR USE  1. Sit on the edge of your bed if possible, or sit up as far as you can in bed or on a chair. 2. Hold the incentive spirometer in an upright position. 3. Breathe out normally. 4. Place the mouthpiece in your mouth and seal your lips tightly around it. 5. Breathe in slowly and as deeply as possible, raising the piston or the ball toward the top of the column. 6. Hold your breath for 3-5 seconds or for as long as possible. Allow the piston or ball to fall to the bottom of the column. 7. Remove the mouthpiece from your mouth and breathe out normally. 8. Rest for a few seconds and repeat Steps 1 through 7 at least 10 times every 1-2 hours when you are awake. Take your time and take a few normal breaths between deep breaths. 9. The spirometer may include an indicator to show your best effort. Use the indicator as a goal to work toward during each repetition. 10. After each set of 10 deep breaths, practice coughing to be sure your lungs are clear. If you have an incision (the cut made at the time of surgery), support your incision when coughing by placing a pillow or rolled up towels firmly against it. Once you are able to get out of bed, walk around indoors and cough well. You may stop using the incentive spirometer when instructed by your caregiver.  RISKS AND COMPLICATIONS  Take your time so you do not get  dizzy or light-headed.  If you are in pain, you may need to take or ask for pain medication before doing incentive spirometry. It is harder to take a deep breath if you are having pain. AFTER USE  Rest and breathe slowly and easily.  It can be helpful to keep track of a log of  your progress. Your caregiver can provide you with a simple table to help with this. If you are using the spirometer at home, follow these instructions: [place] IF:   You are having difficultly using the spirometer.  You have trouble using the spirometer as often as instructed.  Your pain medication is not giving enough relief while using the spirometer.  You develop fever of 100.5 F (38.1 C) or higher. SEEK IMMEDIATE MEDICAL CARE IF:   You cough up bloody sputum that had not been present before.  You develop fever of 102 F (38.9 C) or greater.  You develop worsening pain at or near the incision site. MAKE SURE YOU:   Understand these instructions.  Will watch your condition.  Will get help right away if you are not doing well or get worse. Document Released: 12/13/2006 Document Revised: 10/25/2011 Document Reviewed: 02/13/2007 [facility] Patient Information 2014 [place], [place].   ________________________________________________________________________

## 2017-09-15 ENCOUNTER — Other Ambulatory Visit: Payer: Self-pay

## 2017-09-15 ENCOUNTER — Encounter: Payer: Self-pay | Admitting: Obstetrics & Gynecology

## 2017-09-15 ENCOUNTER — Ambulatory Visit: Payer: Commercial Managed Care - PPO | Admitting: Obstetrics & Gynecology

## 2017-09-15 ENCOUNTER — Encounter (HOSPITAL_COMMUNITY): Payer: Self-pay

## 2017-09-15 ENCOUNTER — Encounter (HOSPITAL_COMMUNITY)
Admission: RE | Admit: 2017-09-15 | Discharge: 2017-09-15 | Disposition: A | Discharge status: Home / Self Care | Payer: Commercial Managed Care - PPO | Source: Ambulatory Visit | Attending: Obstetrics & Gynecology | Admitting: Obstetrics & Gynecology

## 2017-09-15 VITALS — BP 126/74

## 2017-09-15 DIAGNOSIS — Z01812 Encounter for preprocedural laboratory examination: Secondary | ICD-10-CM | POA: Diagnosis present

## 2017-09-15 DIAGNOSIS — R87612 Low grade squamous intraepithelial lesion on cytologic smear of cervix (LGSIL): Secondary | ICD-10-CM | POA: Insufficient documentation

## 2017-09-15 DIAGNOSIS — D060 Carcinoma in situ of endocervix: Secondary | ICD-10-CM | POA: Diagnosis not present

## 2017-09-15 DIAGNOSIS — D069 Carcinoma in situ of cervix, unspecified: Secondary | ICD-10-CM | POA: Diagnosis not present

## 2017-09-15 LAB — PREGNANCY, URINE: PREG TEST UR: NEGATIVE

## 2017-09-15 NOTE — Progress Notes (Signed)
    Sherry Anderson [birth year] [id number]        57 y.o.  G2P2L3 Single.  Twin by C/S and 1 SVD.  Jobs involves heavy lifting.  RP: Preop for Robotic TLH/BSO for Endocervical AdenoCarcinoma in Situ  HPI:  Colposcopy showing Severe Dysplasia 04/2017.  Conization 05/2017 with Patho as shown below.  Patient desires Hysterectomy.  Sent to me by Dr Wayne Both to proceed with a Robotic TLH/BSO.  H/O Left Breast Cancer in 2002, left lumpectomy/Radiation/Chemotherapy.  Menopause since then, no HRT.  No PMB.  No pelvic pain.  Abstinent.   Patho 05/2017: Diagnosis 1. Cervix, cone - HIGH GRADE SQUAMOUS INTRAEPITHELIAL LESION (CIN 2-3; MODERATE TO SEVERE DYSPLASIA) - ENDOCERVICAL ADENOCARCINOMA IN-SITU - MARGINS UNINVOLVED BY DYSPLASIA OR CARCINOMA - SEE COMMENT 2. Endocervix, curettage - SQUAMOUS DYSPLASIA - BENIGN GLANDULAR EPITHELIUM - NO MALIGNANCY IDENTIFIED 3. Endometrium, curettage - MINUTE STRIPS OF INACTIVE ENDOMETRIAL GLANDS - NO HYPERPLASIA OR MALIGNANCY IDENTIFIED 4. Polyp - BENIGN ENDOCERVICAL POLYP  OB History  Gravida Para Term Preterm AB Living  2         3  SAB TAB Ectopic Multiple Live Births        1      # Outcome Date GA Lbr Len/2nd Weight Sex Delivery Anes PTL Lv  2 Gravida           1 Gravida               Past medical history,surgical history, problem list, medications, allergies, family history and social history were all reviewed and documented in the EPIC chart.   Directed ROS with pertinent positives and negatives documented in the history of present illness/assessment and plan.  Exam:  Vitals:   09/15/17 1457  BP: 126/74   General appearance:  Normal  Abdomen: Soft, NT, not distended.  Gynecologic exam:  Vulva normal.  Bimanual exam: Anteverted uterus, normal volume, nontender.  No adnexal mass, nontender.  No descent.   Assessment/Plan:  57 y.o. G2P2   1. Adenocarcinoma in situ (AIS) of uterine cervix Endocervical adeno carcinoma in situ on  cold knife conization on October 2018.  At that same procedure and endocervical curettage done after the conization was negative, endometrial biopsy benign and a benign cervical polyp was removed.  Patient has a history of left breast cancer in 2002 and has been in menopause since her chemo therapy treatment.  She has had a C-section and a vaginal delivery, with minimal descent of the uterus on exam.  Decision to proceed with robotic total laparoscopic hysterectomy and bilateral salpingo-oophorectomy.  Preop, surgery, risks and benefits, as well as postop course thoroughly discussed with the patient.  Patient voiced understanding and agreement with plan.  Pamphlet given.                        Patient was counseled as to the risk of surgery to include the following:  1. Infection (prohylactic antibiotics will be administered)  2. DVT/Pulmonary Embolism (prophylactic pneumo compression stockings will be used)  3.Trauma to internal organs requiring additional surgical procedure to repair any injury to internal organs requiring perhaps additional hospitalization days.  4.Hemmorhage requiring transfusion and blood products which carry risks such as anaphylactic reaction, hepatitis and AIDS  Patient had received literature information on the procedure scheduled and all her questions were answered and fully accepts all risk.   Princess Bruins MD, 3:18 PM 09/15/2017

## 2017-09-16 ENCOUNTER — Encounter: Payer: Self-pay | Admitting: Obstetrics & Gynecology

## 2017-09-16 NOTE — Patient Instructions (Signed)
1. Adenocarcinoma in situ (AIS) of uterine cervix Endocervical adeno carcinoma in situ on cold knife conization on October 2018.  At that same procedure and endocervical curettage done after the conization was negative, endometrial biopsy benign and a benign cervical polyp was removed.  Patient has a history of left breast cancer in 2002 and has been in menopause since her chemo therapy treatment.  She has had a C-section and a vaginal delivery, with minimal descent of the uterus on exam.  Decision to proceed with robotic total laparoscopic hysterectomy and bilateral salpingo-oophorectomy.  Preop, surgery, risks and benefits, as well as postop course thoroughly discussed with the patient.  Patient voiced understanding and agreement with plan.  Pamphlet given.                        Patient was counseled as to the risk of surgery to include the following:  1. Infection (prohylactic antibiotics will be administered)  2. DVT/Pulmonary Embolism (prophylactic pneumo compression stockings will be used)  3.Trauma to internal organs requiring additional surgical procedure to repair any injury to internal organs requiring perhaps additional hospitalization days.  4.Hemmorhage requiring transfusion and blood products which carry risks such as anaphylactic reaction, hepatitis and AIDS  Patient had received literature information on the procedure scheduled and all her questions were answered and fully accepts all risk.   Elwyn Lade, fue un placer conocerle hoy!    Histerectoma total laparoscpica (Total Laparoscopic Hysterectomy) La histerectoma laparoscpica total es una ciruga mnimamente invasiva para extirpar el tero y el cuello. Esta ciruga se realiza haciendo pequeos cortes(incisiones) en el abdomen. Tambin se puede hacer con un tubo delgado que ilumina (laparoscopio) que se inserta a travs de dos pequeas incisiones en el abdomen inferior. Tambin durante esta ciruga podrn extirparse las  trompas de Falopio y los ovarios (salpingo-ooforectoma bilateral). Los beneficios de la ciruga mnimamente invasiva son:  Producer, television/film/video.  Menor riesgo de cogulos sanguneos.  Menor riesgo de sufrir infecciones.  Regreso ms rpido a las [employer]. INFORME A SU MDICO:  Cualquier alergia que tenga.  Todos los [employer] [place], incluyendo vitaminas, hierbas, gotas oftlmicas, cremas y medicamentos de venta libre.  Problemas previos que usted o los [employer] de su familia hayan tenido con el uso de anestsicos.  Enfermedades de [employer].  Cirugas previas.  Padecimientos mdicos.  RIESGOS Y COMPLICACIONES Generalmente es un procedimiento seguro. Sin embargo, Games developer procedimiento, pueden surgir complicaciones. Las complicaciones posibles son:  Hemorragias.  Cogulos de [employer] piernas o los pulmones.  Infeccin.  Lesin en los rganos circundantes.  Problemas con la anestesia.  Sntomas precoces de menopausia (sofocos, sudores nocturnos, insomnio).  Riesgo de conversin a una incisin abdominal abierta. ANTES DEL PROCEDIMIENTO  Consulte a su mdico si debe cambiar o suspender los medicamentos que toma habitualmente.  No tome aspirina ni anticoagulantes durante la semana previa a la [place], o segn le haya indicado su mdico.  No coma ni beba nada durante las 8 horas previas al procedimiento, o segn le haya indicado su mdico.  Si fuma, deje de hacerlo.  Solicite que la lleven a su casa luego de la ciruga y que alguien la ayude con las tareas de la casa durante el tiempo de la recuperacin.  PROCEDIMIENTO  Le administraran antibiticos.  Se le colocar una sonda intravenosa en un brazo. Le administrarn un medicamento que la har dormir (anestesia general).  Le insuflarn un gas (dixido de carbono) en el abdomen.  Esto le permitir al cirujano observar el interior del abdomen, Optometrist la ciruga y si es necesario, el  tratamiento de otros problemas encontrados.  Le harn tres o cuatro pequeas incisiones (generalmente de menos de  pulgada) en el abdomen. Una de estas incisiones se harn en la zona del ombligo. El laparoscopio se inserta en la incisin. El cirujano observa a travs del laparoscopio mientras realiza el procedimiento.  Otros instrumentos quirrgicos se insertan a travs de las otras incisiones.  El tero se extirpar a travs de la vagina o se seccionar en trozos pequeos y se retirar por las pequeas [place].  DESPUS DEL PROCEDIMIENTO  Se liberar el gas del interior del abdomen.  Despus de la ciruga la llevarn al rea de recuperacin donde un enfermero la observar y Chief Technology Officer su evolucin. Una vez que despierte, se encuentre estabilizada y pueda ingerir lquidos, excepto que ocurra un imprevisto, podr volver a Administrator, arts.  Suele haber pocas molestias despus de la ciruga debido a que las incisiones son muy pequeas.  Se le dar medicamento para el dolor mientras se encuentre en el hospital y para cuando vaya a su casa.  Esta informacin no tiene Marine scientist el consejo del mdico. Asegrese de hacerle al mdico cualquier pregunta que tenga. Document Released: 07/22/2011 Document Revised: 04/04/2013 Document Reviewed: 02/20/2013 Elsevier Interactive Patient Education  2017 [employer].

## 2017-09-19 ENCOUNTER — Encounter: Payer: Self-pay | Admitting: Anesthesiology

## 2017-09-20 ENCOUNTER — Encounter (HOSPITAL_COMMUNITY): Payer: Self-pay | Admitting: Anesthesiology

## 2017-09-20 NOTE — Anesthesia Preprocedure Evaluation (Addendum)
Anesthesia Evaluation  Patient identified by MRN, date of birth, ID band Patient awake    Reviewed: Allergy & Precautions, NPO status , Patient's Chart, lab work & pertinent test results  Airway Mallampati: II  TM Distance: >3 FB Neck ROM: Full    Dental  (+) Missing,    Pulmonary    breath sounds clear to auscultation       Cardiovascular hypertension, Pt. on medications  Rhythm:Regular Rate:Normal     Neuro/Psych  Headaches, PSYCHIATRIC DISORDERS Anxiety Depression  Neuromuscular disease    GI/Hepatic Neg liver ROS, GERD  Medicated,  Endo/Other  negative endocrine ROS  Renal/GU negative Renal ROS     Musculoskeletal  (+) Arthritis , Osteoarthritis,    Abdominal Normal abdominal exam  (+)   Peds  Hematology   Anesthesia Other Findings   Reproductive/Obstetrics                            Lab Results  Component Value Date   WBC 5.2 09/07/2017   HGB 12.5 09/07/2017   HCT 36.5 09/07/2017   MCV 90.3 09/07/2017   PLT 274 09/07/2017   Lab Results  Component Value Date   CREATININE 1.00 09/07/2017   BUN 19 09/07/2017   NA 138 09/07/2017   K 3.4 (L) 09/07/2017   CL 105 09/07/2017   CO2 23 09/07/2017   No results found for: INR, PROTIME  EKG: NSR  Stress Test:  There was no ST segment deviation noted during stress.  No T wave inversion was noted during stress.  Blood pressure demonstrated a normal response to exercise.  The test was stopped because the patient complained of shortness of breath  Overall, the patient's exercise capacity was normal.  Duke Treadmill Score: low risk     Anesthesia Physical Anesthesia Plan  ASA: II  Anesthesia Plan: General   Post-op Pain Management:    Induction: Intravenous  PONV Risk Score and Plan: 4 or greater and Ondansetron, Dexamethasone, Midazolam and Scopolamine patch - Pre-op  Airway Management Planned: Oral  ETT  Additional Equipment: None  Intra-op Plan:   Post-operative Plan: Extubation in OR  Informed Consent: I have reviewed the patients History and Physical, chart, labs and discussed the procedure including the risks, benefits and alternatives for the proposed anesthesia with the patient or authorized representative who has indicated his/her understanding and acceptance.   Dental advisory given  Plan Discussed with: CRNA  Anesthesia Plan Comments:        Anesthesia Quick Evaluation

## 2017-09-21 ENCOUNTER — Encounter (HOSPITAL_COMMUNITY): Payer: Self-pay | Admitting: Emergency Medicine

## 2017-09-21 ENCOUNTER — Ambulatory Visit (HOSPITAL_COMMUNITY): Payer: Commercial Managed Care - PPO | Admitting: Anesthesiology

## 2017-09-21 ENCOUNTER — Ambulatory Visit (HOSPITAL_COMMUNITY)
Admission: RE | Admit: 2017-09-21 | Discharge: 2017-09-22 | Disposition: A | Discharge status: Home / Self Care | Payer: Commercial Managed Care - PPO | Source: Ambulatory Visit | Attending: Obstetrics & Gynecology | Admitting: Obstetrics & Gynecology

## 2017-09-21 ENCOUNTER — Other Ambulatory Visit: Payer: Self-pay

## 2017-09-21 ENCOUNTER — Encounter (HOSPITAL_COMMUNITY)
Admission: RE | Disposition: A | Discharge status: Home / Self Care | Payer: Self-pay | Source: Ambulatory Visit | Attending: Obstetrics & Gynecology

## 2017-09-21 DIAGNOSIS — K219 Gastro-esophageal reflux disease without esophagitis: Secondary | ICD-10-CM | POA: Insufficient documentation

## 2017-09-21 DIAGNOSIS — Z9221 Personal history of antineoplastic chemotherapy: Secondary | ICD-10-CM | POA: Diagnosis not present

## 2017-09-21 DIAGNOSIS — N888 Other specified noninflammatory disorders of cervix uteri: Secondary | ICD-10-CM | POA: Insufficient documentation

## 2017-09-21 DIAGNOSIS — N736 Female pelvic peritoneal adhesions (postinfective): Secondary | ICD-10-CM | POA: Insufficient documentation

## 2017-09-21 DIAGNOSIS — K660 Peritoneal adhesions (postprocedural) (postinfection): Secondary | ICD-10-CM | POA: Diagnosis not present

## 2017-09-21 DIAGNOSIS — D061 Carcinoma in situ of exocervix: Secondary | ICD-10-CM | POA: Diagnosis present

## 2017-09-21 DIAGNOSIS — Z853 Personal history of malignant neoplasm of breast: Secondary | ICD-10-CM | POA: Insufficient documentation

## 2017-09-21 DIAGNOSIS — Z923 Personal history of irradiation: Secondary | ICD-10-CM | POA: Diagnosis not present

## 2017-09-21 DIAGNOSIS — F329 Major depressive disorder, single episode, unspecified: Secondary | ICD-10-CM | POA: Insufficient documentation

## 2017-09-21 DIAGNOSIS — D259 Leiomyoma of uterus, unspecified: Secondary | ICD-10-CM | POA: Diagnosis not present

## 2017-09-21 DIAGNOSIS — D060 Carcinoma in situ of endocervix: Secondary | ICD-10-CM | POA: Diagnosis not present

## 2017-09-21 DIAGNOSIS — N870 Mild cervical dysplasia: Secondary | ICD-10-CM | POA: Diagnosis not present

## 2017-09-21 DIAGNOSIS — D271 Benign neoplasm of left ovary: Secondary | ICD-10-CM | POA: Diagnosis not present

## 2017-09-21 DIAGNOSIS — Z79899 Other long term (current) drug therapy: Secondary | ICD-10-CM | POA: Insufficient documentation

## 2017-09-21 DIAGNOSIS — I10 Essential (primary) hypertension: Secondary | ICD-10-CM | POA: Insufficient documentation

## 2017-09-21 DIAGNOSIS — Z9889 Other specified postprocedural states: Secondary | ICD-10-CM

## 2017-09-21 DIAGNOSIS — R87613 High grade squamous intraepithelial lesion on cytologic smear of cervix (HGSIL): Secondary | ICD-10-CM | POA: Diagnosis not present

## 2017-09-21 HISTORY — PX: ROBOTIC ASSISTED TOTAL HYSTERECTOMY WITH BILATERAL SALPINGO OOPHERECTOMY: SHX6086

## 2017-09-21 LAB — TYPE AND SCREEN
ABO/RH(D): A POS
Antibody Screen: NEGATIVE

## 2017-09-21 LAB — ABO/RH: ABO/RH(D): A POS

## 2017-09-21 SURGERY — HYSTERECTOMY, TOTAL, ROBOT-ASSISTED, LAPAROSCOPIC, WITH BILATERAL SALPINGO-OOPHORECTOMY
Anesthesia: General | Site: Abdomen

## 2017-09-21 MED ORDER — BUPIVACAINE HCL (PF) 0.25 % IJ SOLN
INTRAMUSCULAR | Status: DC | PRN
  Administered 2017-09-21: 12 mL

## 2017-09-21 MED ORDER — CEFAZOLIN SODIUM-DEXTROSE 2-4 GM/100ML-% IV SOLN
2.0000 g | INTRAVENOUS | Status: AC
  Administered 2017-09-21: 2 g via INTRAVENOUS
  Filled 2017-09-21: qty 100

## 2017-09-21 MED ORDER — ONDANSETRON HCL 4 MG/2ML IJ SOLN
INTRAMUSCULAR | Status: AC
  Filled 2017-09-21: qty 2

## 2017-09-21 MED ORDER — MIDAZOLAM HCL 2 MG/2ML IJ SOLN
INTRAMUSCULAR | Status: DC | PRN
  Administered 2017-09-21: 2 mg via INTRAVENOUS

## 2017-09-21 MED ORDER — SUGAMMADEX SODIUM 200 MG/2ML IV SOLN
INTRAVENOUS | Status: AC
  Filled 2017-09-21: qty 2

## 2017-09-21 MED ORDER — HYDROMORPHONE HCL 1 MG/ML IJ SOLN
0.2500 mg | INTRAMUSCULAR | Status: DC | PRN
  Administered 2017-09-21 (×4): 0.5 mg via INTRAVENOUS

## 2017-09-21 MED ORDER — ACETAMINOPHEN 10 MG/ML IV SOLN
1000.0000 mg | Freq: Once | INTRAVENOUS | Status: AC
  Administered 2017-09-21: 1000 mg via INTRAVENOUS

## 2017-09-21 MED ORDER — PHENYLEPHRINE HCL 10 MG/ML IJ SOLN
INTRAMUSCULAR | Status: DC | PRN
  Administered 2017-09-21 (×5): 80 ug via INTRAVENOUS

## 2017-09-21 MED ORDER — PROPOFOL 10 MG/ML IV BOLUS
INTRAVENOUS | Status: DC | PRN
  Administered 2017-09-21: 130 mg via INTRAVENOUS

## 2017-09-21 MED ORDER — LACTATED RINGERS IV SOLN
INTRAVENOUS | Status: DC
  Administered 2017-09-21: 08:00:00 via INTRAVENOUS

## 2017-09-21 MED ORDER — PROPOFOL 10 MG/ML IV BOLUS
INTRAVENOUS | Status: AC
  Filled 2017-09-21: qty 40

## 2017-09-21 MED ORDER — HYDROMORPHONE HCL 1 MG/ML IJ SOLN
0.5000 mg | INTRAMUSCULAR | Status: DC | PRN
  Administered 2017-09-21 (×2): 0.5 mg via INTRAVENOUS

## 2017-09-21 MED ORDER — ACETAMINOPHEN 10 MG/ML IV SOLN
INTRAVENOUS | Status: AC
  Filled 2017-09-21: qty 100

## 2017-09-21 MED ORDER — FENTANYL CITRATE (PF) 100 MCG/2ML IJ SOLN
INTRAMUSCULAR | Status: DC | PRN
  Administered 2017-09-21 (×5): 50 ug via INTRAVENOUS

## 2017-09-21 MED ORDER — HYDROMORPHONE HCL 1 MG/ML IJ SOLN
INTRAMUSCULAR | Status: AC
  Filled 2017-09-21: qty 1

## 2017-09-21 MED ORDER — PHENYLEPHRINE HCL 10 MG/ML IJ SOLN
INTRAMUSCULAR | Status: AC
  Filled 2017-09-21: qty 2

## 2017-09-21 MED ORDER — PROMETHAZINE HCL 25 MG/ML IJ SOLN: 6.2500 mg | INTRAMUSCULAR | Status: DC | PRN

## 2017-09-21 MED ORDER — ARTIFICIAL TEARS OPHTHALMIC OINT
TOPICAL_OINTMENT | OPHTHALMIC | Status: AC
  Filled 2017-09-21: qty 3.5

## 2017-09-21 MED ORDER — OXYCODONE-ACETAMINOPHEN 5-325 MG PO TABS
1.0000 | ORAL_TABLET | ORAL | Status: DC | PRN
  Administered 2017-09-21 – 2017-09-22 (×3): 1 via ORAL

## 2017-09-21 MED ORDER — HYDROMORPHONE HCL 1 MG/ML IJ SOLN
INTRAMUSCULAR | Status: AC
Start: 2017-09-21 — End: 2017-09-21
  Filled 2017-09-21: qty 1

## 2017-09-21 MED ORDER — DEXAMETHASONE SODIUM PHOSPHATE 10 MG/ML IJ SOLN
INTRAMUSCULAR | Status: DC | PRN
  Administered 2017-09-21: 10 mg via INTRAVENOUS

## 2017-09-21 MED ORDER — BUPROPION HCL ER (XL) 150 MG PO TB24
150.0000 mg | ORAL_TABLET | Freq: Every day | ORAL | Status: DC
  Filled 2017-09-21: qty 1

## 2017-09-21 MED ORDER — PHENYLEPHRINE 40 MCG/ML (10ML) SYRINGE FOR IV PUSH (FOR BLOOD PRESSURE SUPPORT)
PREFILLED_SYRINGE | INTRAVENOUS | Status: AC
  Filled 2017-09-21: qty 10

## 2017-09-21 MED ORDER — IBUPROFEN 600 MG PO TABS
600.0000 mg | ORAL_TABLET | Freq: Four times a day (QID) | ORAL | Status: DC | PRN
  Administered 2017-09-22: 600 mg via ORAL
  Filled 2017-09-21: qty 1

## 2017-09-21 MED ORDER — LIDOCAINE 2% (20 MG/ML) 5 ML SYRINGE
INTRAMUSCULAR | Status: AC
  Filled 2017-09-21: qty 5

## 2017-09-21 MED ORDER — PHENYLEPHRINE HCL 10 MG/ML IJ SOLN
INTRAVENOUS | Status: DC | PRN
  Administered 2017-09-21: 25 ug/min via INTRAVENOUS

## 2017-09-21 MED ORDER — SUGAMMADEX SODIUM 200 MG/2ML IV SOLN
INTRAVENOUS | Status: DC | PRN
  Administered 2017-09-21: 400 mg via INTRAVENOUS

## 2017-09-21 MED ORDER — VENLAFAXINE HCL ER 75 MG PO CP24
225.0000 mg | ORAL_CAPSULE | Freq: Every day | ORAL | Status: DC
  Filled 2017-09-21: qty 1

## 2017-09-21 MED ORDER — SCOPOLAMINE 1 MG/3DAYS TD PT72
MEDICATED_PATCH | TRANSDERMAL | Status: DC | PRN
  Administered 2017-09-21: 1 via TRANSDERMAL

## 2017-09-21 MED ORDER — DEXAMETHASONE SODIUM PHOSPHATE 10 MG/ML IJ SOLN
INTRAMUSCULAR | Status: AC
  Filled 2017-09-21: qty 1

## 2017-09-21 MED ORDER — KETOROLAC TROMETHAMINE 30 MG/ML IJ SOLN
INTRAMUSCULAR | Status: DC | PRN
  Administered 2017-09-21: 30 mg via INTRAVENOUS

## 2017-09-21 MED ORDER — LACTATED RINGERS IV SOLN: INTRAVENOUS | Status: DC

## 2017-09-21 MED ORDER — ROCURONIUM BROMIDE 10 MG/ML (PF) SYRINGE
PREFILLED_SYRINGE | INTRAVENOUS | Status: AC
  Filled 2017-09-21: qty 5

## 2017-09-21 MED ORDER — SODIUM CHLORIDE 0.9 % IR SOLN
Status: DC | PRN
  Administered 2017-09-21: 3000 mL

## 2017-09-21 MED ORDER — OXYCODONE-ACETAMINOPHEN 5-325 MG PO TABS
ORAL_TABLET | ORAL | Status: AC
  Filled 2017-09-21: qty 1

## 2017-09-21 MED ORDER — MEPERIDINE HCL 50 MG/ML IJ SOLN: 6.2500 mg | INTRAMUSCULAR | Status: DC | PRN

## 2017-09-21 MED ORDER — MIDAZOLAM HCL 2 MG/2ML IJ SOLN
INTRAMUSCULAR | Status: AC
  Filled 2017-09-21: qty 2

## 2017-09-21 MED ORDER — FENTANYL CITRATE (PF) 250 MCG/5ML IJ SOLN
INTRAMUSCULAR | Status: AC
  Filled 2017-09-21: qty 10

## 2017-09-21 MED ORDER — LIDOCAINE 2% (20 MG/ML) 5 ML SYRINGE
INTRAMUSCULAR | Status: DC | PRN
  Administered 2017-09-21: 60 mg via INTRAVENOUS

## 2017-09-21 MED ORDER — LACTATED RINGERS IV SOLN
INTRAVENOUS | Status: DC
  Administered 2017-09-21 – 2017-09-22 (×2): via INTRAVENOUS

## 2017-09-21 MED ORDER — ONDANSETRON HCL 4 MG/2ML IJ SOLN
INTRAMUSCULAR | Status: DC | PRN
  Administered 2017-09-21: 4 mg via INTRAVENOUS

## 2017-09-21 MED ORDER — ROCURONIUM BROMIDE 10 MG/ML (PF) SYRINGE
PREFILLED_SYRINGE | INTRAVENOUS | Status: DC | PRN
  Administered 2017-09-21: 20 mg via INTRAVENOUS
  Administered 2017-09-21: 50 mg via INTRAVENOUS
  Administered 2017-09-21: 20 mg via INTRAVENOUS

## 2017-09-21 MED ORDER — TRIAMTERENE-HCTZ 37.5-25 MG PO CAPS
1.0000 | ORAL_CAPSULE | Freq: Every morning | ORAL | Status: DC
  Filled 2017-09-21: qty 1

## 2017-09-21 MED ORDER — SCOPOLAMINE 1 MG/3DAYS TD PT72: MEDICATED_PATCH | TRANSDERMAL | Status: DC | PRN

## 2017-09-21 MED ORDER — SCOPOLAMINE 1 MG/3DAYS TD PT72
MEDICATED_PATCH | TRANSDERMAL | Status: AC
  Filled 2017-09-21: qty 1

## 2017-09-21 MED ORDER — BUPIVACAINE HCL (PF) 0.25 % IJ SOLN
INTRAMUSCULAR | Status: AC
  Filled 2017-09-21: qty 30

## 2017-09-21 MED ORDER — BACLOFEN 5 MG HALF TABLET
5.0000 mg | ORAL_TABLET | Freq: Three times a day (TID) | ORAL | Status: DC
  Administered 2017-09-21: 5 mg via ORAL
  Filled 2017-09-21 (×2): qty 1

## 2017-09-21 MED ORDER — LISINOPRIL 20 MG PO TABS
20.0000 mg | ORAL_TABLET | Freq: Every morning | ORAL | Status: DC
  Filled 2017-09-21: qty 1

## 2017-09-21 MED ORDER — PANTOPRAZOLE SODIUM 40 MG PO TBEC
40.0000 mg | DELAYED_RELEASE_TABLET | Freq: Every day | ORAL | Status: DC
  Administered 2017-09-21: 40 mg via ORAL
  Filled 2017-09-21: qty 1

## 2017-09-21 MED ORDER — KETOROLAC TROMETHAMINE 30 MG/ML IJ SOLN
INTRAMUSCULAR | Status: AC
  Filled 2017-09-21: qty 1

## 2017-09-21 SURGICAL SUPPLY — 58 items
ADH SKN CLS APL DERMABOND .7 (GAUZE/BANDAGES/DRESSINGS) ×1
BARRIER ADHS 3X4 INTERCEED (GAUZE/BANDAGES/DRESSINGS) IMPLANT
BRR ADH 4X3 ABS CNTRL BYND (GAUZE/BANDAGES/DRESSINGS)
CANISTER SUCT 3000ML PPV (MISCELLANEOUS) ×2 IMPLANT
CATH FOLEY 3WAY  5CC 16FR (CATHETERS) ×1
CATH FOLEY 3WAY 5CC 16FR (CATHETERS) ×1 IMPLANT
COVER BACK TABLE 60X90IN (DRAPES) ×2 IMPLANT
COVER SURGICAL LIGHT HANDLE (MISCELLANEOUS) ×1 IMPLANT
COVER TIP SHEARS 8 DVNC (MISCELLANEOUS) ×1 IMPLANT
COVER TIP SHEARS 8MM DA VINCI (MISCELLANEOUS) ×1
DECANTER SPIKE VIAL GLASS SM (MISCELLANEOUS) ×3 IMPLANT
DEFOGGER SCOPE WARMER CLEARIFY (MISCELLANEOUS) ×2 IMPLANT
DERMABOND ADVANCED (GAUZE/BANDAGES/DRESSINGS) ×1
DERMABOND ADVANCED .7 DNX12 (GAUZE/BANDAGES/DRESSINGS) ×1 IMPLANT
DRAPE ARM DVNC X/XI (DISPOSABLE) ×4 IMPLANT
DRAPE COLUMN DVNC XI (DISPOSABLE) ×1 IMPLANT
DRAPE DA VINCI XI ARM (DISPOSABLE) ×4
DRAPE DA VINCI XI COLUMN (DISPOSABLE) ×1
DURAPREP 26ML APPLICATOR (WOUND CARE) ×2 IMPLANT
ELECT REM PT RETURN 9FT ADLT (ELECTROSURGICAL) ×2
ELECTRODE REM PT RTRN 9FT ADLT (ELECTROSURGICAL) ×1 IMPLANT
GLOVE BIO SURGEON STRL SZ 6.5 (GLOVE) ×6 IMPLANT
GLOVE BIO SURGEON STRL SZ7.5 (GLOVE) ×2 IMPLANT
GLOVE BIOGEL PI IND STRL 7.0 (GLOVE) ×5 IMPLANT
GLOVE BIOGEL PI INDICATOR 7.0 (GLOVE) ×6
IRRIG SUCT STRYKERFLOW 2 WTIP (MISCELLANEOUS) ×2
IRRIGATION SUCT STRKRFLW 2 WTP (MISCELLANEOUS) ×1 IMPLANT
LEGGING LITHOTOMY PAIR STRL (DRAPES) ×2 IMPLANT
OBTURATOR OPTICAL STANDARD 8MM (TROCAR) ×1
OBTURATOR OPTICAL STND 8 DVNC (TROCAR) ×1
OBTURATOR OPTICALSTD 8 DVNC (TROCAR) IMPLANT
OCCLUDER COLPOPNEUMO (BALLOONS) ×2 IMPLANT
PACK ROBOT WH (CUSTOM PROCEDURE TRAY) ×2 IMPLANT
PACK ROBOTIC GOWN (GOWN DISPOSABLE) ×2 IMPLANT
PACK TRENDGUARD 450 HYBRID PRO (MISCELLANEOUS) IMPLANT
PACK TRENDGUARD 600 HYBRD PROC (MISCELLANEOUS) IMPLANT
PAD PREP 24X48 CUFFED NSTRL (MISCELLANEOUS) ×2 IMPLANT
PROTECTOR NERVE ULNAR (MISCELLANEOUS) ×6 IMPLANT
SEAL CANN UNIV 5-8 DVNC XI (MISCELLANEOUS) ×3 IMPLANT
SEAL XI 5MM-8MM UNIVERSAL (MISCELLANEOUS) ×3
SET CYSTO W/LG BORE CLAMP LF (SET/KITS/TRAYS/PACK) IMPLANT
SET TRI-LUMEN FLTR TB AIRSEAL (TUBING) ×2 IMPLANT
SUT ETHIBOND 0 (SUTURE) IMPLANT
SUT VIC AB 4-0 PS2 27 (SUTURE) ×4 IMPLANT
SUT VICRYL 0 UR6 27IN ABS (SUTURE) ×2 IMPLANT
SUT VLOC 180 0 9IN  GS21 (SUTURE)
SUT VLOC 180 0 9IN GS21 (SUTURE) IMPLANT
SUT VLOC 180 2-0 6IN GS21 (SUTURE) IMPLANT
TIP RUMI ORANGE 6.7MMX12CM (TIP) IMPLANT
TIP UTERINE 5.1X6CM LAV DISP (MISCELLANEOUS) ×2 IMPLANT
TIP UTERINE 6.7X10CM GRN DISP (MISCELLANEOUS) IMPLANT
TIP UTERINE 6.7X6CM WHT DISP (MISCELLANEOUS) IMPLANT
TIP UTERINE 6.7X8CM BLUE DISP (MISCELLANEOUS) IMPLANT
TOWEL OR 17X24 6PK STRL BLUE (TOWEL DISPOSABLE) ×6 IMPLANT
TRENDGUARD 450 HYBRID PRO PACK (MISCELLANEOUS) ×2
TRENDGUARD 600 HYBRID PROC PK (MISCELLANEOUS)
TROCAR PORT AIRSEAL 5X120 (TROCAR) ×2 IMPLANT
WATER STERILE IRR 1000ML POUR (IV SOLUTION) ×2 IMPLANT

## 2017-09-21 NOTE — Anesthesia Procedure Notes (Addendum)
Procedure Name: Intubation Date/Time: 09/21/2017 8:46 AM Performed by: Wanita Chamberlain, CRNA Pre-anesthesia Checklist: Patient identified, Emergency Drugs available, Suction available, Patient being monitored and Timeout performed Patient Re-evaluated:Patient Re-evaluated prior to induction Oxygen Delivery Method: Circle system utilized Preoxygenation: Pre-oxygenation with 100% oxygen Induction Type: IV induction and Cricoid Pressure applied Ventilation: Mask ventilation without difficulty Laryngoscope Size: Mac Grade View: Grade II Tube type: Oral Tube size: 7.0 mm Number of attempts: 1 Airway Equipment and Method: Stylet and Bite block Placement Confirmation: ETT inserted through vocal cords under direct vision,  positive ETCO2,  CO2 detector and breath sounds checked- equal and bilateral Secured at: 21 cm Tube secured with: Tape Dental Injury: Teeth and Oropharynx as per pre-operative assessment

## 2017-09-21 NOTE — Op Note (Signed)
Operative Note  09/21/2017  11:04 AM  PATIENT:  Sherry Anderson  57 y.o. female  PRE-OPERATIVE DIAGNOSIS:  high grade dysplasia, adenocarcinoma in situ of the endocervix  POST-OPERATIVE DIAGNOSIS:  high grade dysplasia, adenocarcinoma in situ of the endocervix, adhesions of bowels and appendix  PROCEDURE:  Procedure(s): XI ROBOTIC ASSISTED TOTAL HYSTERECTOMY WITH BILATERAL SALPINGO OOPHORECTOMY, Lysis of Adhesions  SURGEON:  Surgeon(s): Fontaine, Belinda Block, MD Princess Bruins, MD  ANESTHESIA:   general  FINDINGS: Uterus, tubes and ovaries normal.  Adhesions of bowels with the left abdominal-pelvic wall and adhesions of the appendix with the right pelvic wall.  DESCRIPTION OF OPERATION: Under general anesthesia with endotracheal intubation the patient is in lithotomy position.  She is prepped with DuraPrep of the abdomen and with Betadine on the suprapubic, vulvar and vaginal areas.  Trendelenburg test is done.  Timeout is done.  The Foley is inserted in the bladder.  The weighted speculum was inserted in the vagina and the anterior lip of the cervix is grasped with a tenaculum.  The hysterometry is at 8 cm.  We used a #6 Rumi with a small Ko ring and put it in place without difficulty.  The other instruments were removed.  The uterus is anteverted normal volume with no adnexal mass.  We go to the abdomen.  Infiltration of Marcaine one quarter plain and the supraumbilical area.  A 1 cm incision is made with a scalpel at that level.  The Veress needle was inserted to create a pneumoperitoneum with CO2.  Security test done with water.  The Veress needle was then removed.  Insertion of the first robotic trocar.  The camera was inserted at that level.  Inspection of the abdominal pelvic cavities.  The anterior wall of the abdomen is clear.  We infiltrate all port locations.  We make 8 mm incisions at each side with a scalpel.  Robotic ports are inserted under direct vision with 2 on the right  side and one on the left side all in line with the umbilicus.  We then inserted the assistant port under direct vision, 500 slightly higher, on the left most distal site.  The Robot is docked.  Targeting is done.  The robotic instruments are inserted under direct vision.  We put the [place] in the  fourth arm, the Endo Shears scissors and the third arm and the fenestrated bipolar in the fourth arm.  We go to the console.        Both ureters are visualized in normal anatomic position with good peristalsis.  The uterus, both tubes and both ovaries are normal.  Bowel adhesions with the left abdominal pelvic wall are present.    The appendix looks normal but is adherent to the right pelvic wall.  Lysis of the bowel adhesions on the left abdominal and pelvic walls.  We start on the right side with cauterization and section of the right infundibulopelvic ligament.  We continue to cauterize and cut just below the right ovary.  We cauterized and cut the right round ligament.  We then opened the broad ligament and then the anterior visceral peritoneum.  We proceeded exactly the same way on the left side.  The bladder is adherent to the anterior aspect of the uterus probably secondary to her previous C-section.  Those adhesions are carefully lysed.  The bladder is descended past the Ko ring.  We then cauterized the right uterine artery.  We cauterized and cut to the left uterine artery.  We come back to the right side and cauterized and cut the right uterine artery.  The vaginal occluder is inflated.  We then opened the superior aspect of the vagina over the Ko ring with the tip of the Endo Shears scissor anteriorly then posteriorly and finally on each side.  The uterus is completely freed with both tubes and both ovaries and passed vaginally.  We send the specimen to pathology.  Hemostasis is adequate at all levels.  We then switched the instruments to the fenestrated bipolar and the fourth arm, the cutting needle driver  in the  third arm and the large needle driver in the first arm.  We used a V lock 0 at 9 inches to close the vaginal vault.  We started at the right angle and do a running suture all the way to the left angle and then back to the right angle.  The needle was removed through a robotic port.  We switched the first arm robotic instrument to the [employer] scissor.  Lysed the adhesions between the right pelvic wall and the appendix.  We confirmed hemostasis once more at all levels.  Pictures were taken before and after the hysterectomy.  All robotic instruments were removed.  The robot was undocked.  We went to laparoscopy time.      We removed the Trendelenburg, irrigated and suctioned the abdominal pelvic cavities.  Hemostasis was adequate at all levels.  Robotic instruments were removed.  The ports were removed under direct vision.  The CO2 was evacuated.  All incisions were closed with separate stitches of Vicryl 4-0.  Dermabond was added on all incisions.  The vaginal occluder was removed.  Hemostasis was adequate at that level as well.  The patient was brought to recovery room in good and stable status.  ESTIMATED BLOOD LOSS: 20 mL   Intake/Output Summary (Last 24 hours) at 09/21/2017 1104 Last data filed at 09/21/2017 1059 Gross per 24 hour  Intake 1100 ml  Output 20 ml  Net 1080 ml     BLOOD ADMINISTERED:none   LOCAL MEDICATIONS USED:  MARCAINE     SPECIMEN:  Source of Specimen:  Uterus with cervix,  Bilateral tubes and bilateral ovaries.  DISPOSITION OF SPECIMEN:  PATHOLOGY  COUNTS:  YES  PLAN OF CARE: Transfer to PACU  Marie-Lyne LavoieMD11:04 AM

## 2017-09-21 NOTE — Transfer of Care (Signed)
Immediate Anesthesia Transfer of Care Note  Patient: Sherry Anderson  Procedure(s) Performed: XI ROBOTIC ASSISTED TOTAL HYSTERECTOMY WITH BILATERAL SALPINGO OOPHORECTOMY, Lysis of Adhesions (N/A Abdomen)  Patient Location: PACU  Anesthesia Type:General  Level of Consciousness: awake, alert , oriented and patient cooperative  Airway & Oxygen Therapy: Patient Spontanous Breathing and Patient connected to face mask oxygen  Post-op Assessment: Report given to RN and Post -op Vital signs reviewed and stable  Post vital signs: Reviewed and stable  Last Vitals:  Vitals:   09/21/17 0711  BP: 124/73  Pulse: 72  Resp: 16  Temp: 36.8 C  SpO2: 100%    Last Pain:  Vitals:   09/21/17 0711  TempSrc: Oral      Patients Stated Pain Goal: 4 (92/44/62 8638)  Complications: No apparent anesthesia complications

## 2017-09-21 NOTE — Progress Notes (Signed)
Spoke with Dr. Lonia Skinner, pt needs type and screen. Sample was drawn by [facility]um and sent to lab. Dr. Lonia Skinner will put in order for type and screen.

## 2017-09-21 NOTE — Progress Notes (Signed)
Received pt from PACU via strecher.  Report given by Patrcia Dolly, RN.  VSS. Dermabond intact to 5 port sites- no bleeding noted.  peripad in place w/ scant bloody drng noted.  Instructed pt in using IS- pt did 1250; will reinforce again shortly.  Pt's pain level 5 and asks to hold off on pain med for now.  Will continue to monitor.

## 2017-09-21 NOTE — Anesthesia Postprocedure Evaluation (Signed)
Anesthesia Post Note  Patient: Sherry Anderson  Procedure(s) Performed: XI ROBOTIC ASSISTED TOTAL HYSTERECTOMY WITH BILATERAL SALPINGO OOPHORECTOMY, Lysis of Adhesions (N/A Abdomen)     Patient location during evaluation: PACU Anesthesia Type: General Level of consciousness: awake and alert Pain management: pain level controlled Vital Signs Assessment: post-procedure vital signs reviewed and stable Respiratory status: spontaneous breathing, nonlabored ventilation, respiratory function stable and patient connected to nasal cannula oxygen Cardiovascular status: blood pressure returned to baseline and stable Postop Assessment: no apparent nausea or vomiting Anesthetic complications: no    Last Vitals:  Vitals:   09/21/17 1135 09/21/17 1145  BP:  122/77  Pulse: 86 83  Resp: 12 11  Temp:    SpO2: 100% 100%                 Effie Berkshire

## 2017-09-21 NOTE — H&P (Signed)
Lanyah Calix is an 57 y.o. female.  G2P2L3 Single.  Twin by C/S and 1 SVD.  Jobs involves heavy lifting.  RP: Robotic TLH/BSO for Endocervical AdenoCarcinoma in Situ  HPI:  Colposcopy showing Severe Dysplasia 04/2017.  Conization 05/2017 with Patho as shown below.  Patient desires Hysterectomy.  Sent to me by Dr Wayne Both to proceed with a Robotic TLH/BSO.  H/O Left Breast Cancer in 2002, left lumpectomy/Radiation/Chemotherapy.  Menopause since then, no HRT.  No PMB.  No pelvic pain.  Abstinent.   Patho 05/2017: Diagnosis 1. Cervix, cone - HIGH GRADE SQUAMOUS INTRAEPITHELIAL LESION (CIN 2-3; MODERATE TO SEVERE DYSPLASIA) - ENDOCERVICAL ADENOCARCINOMA IN-SITU - MARGINS UNINVOLVED BY DYSPLASIA OR CARCINOMA - SEE COMMENT 2. Endocervix, curettage - SQUAMOUS DYSPLASIA - BENIGN GLANDULAR EPITHELIUM - NO MALIGNANCY IDENTIFIED 3. Endometrium, curettage - MINUTE STRIPS OF INACTIVE ENDOMETRIAL GLANDS - NO HYPERPLASIA OR MALIGNANCY IDENTIFIED 4. Polyp - BENIGN ENDOCERVICAL POLYP    Pertinent Gynecological History: Menses: post-menopausal Contraception: post menopausal status Blood transfusions: none Last mammogram: normal    Menstrual History:  No LMP recorded (lmp unknown). Patient is postmenopausal.    Past Medical History:  Diagnosis Date  . Adenocarcinoma in situ (AIS) of uterine cervix 05/2017   Associated with high-grade dysplasia  . Anxiety and depression   . Cancer [facility]) 2002   left breast cancer   . Depression   . Diverticulosis of colon   . Essential hypertension, benign   . GERD (gastroesophageal reflux disease)   . High grade squamous intraepithelial cervical dysplasia 05/2017   Associated with adenocarcinoma in situ  . History of adenomatous polyp of colon    tubular adenoma's  02-08-2017  . History of gastritis   . History of left breast cancer 2002---- per pt no recurrence   dx DCIS --- s/p  left breast lumpectomy;  per pt radiation therapy  completed same year and completed antiestrogen therapy   . History of radiation therapy    2002   left breast  . SVD (spontaneous vaginal delivery)    x 1    Past Surgical History:  Procedure Laterality Date  . BREAST LUMPECTOMY Left 2002   ductal CA in situ  . CERVICAL CONIZATION W/BX N/A 06/08/2017   Procedure: CONIZATION CERVIX WITH BIOPSY;  Surgeon: Anastasio Auerbach, MD;  Location: [facility];  Service: Gynecology;  Laterality: N/A;  . [place]   twins  . COLONOSCOPY  02-08-2017   dr Karleen Hampshire  . DILATATION & CURETTAGE/HYSTEROSCOPY WITH MYOSURE N/A 06/08/2017   Procedure: [place];  Surgeon: Anastasio Auerbach, MD;  Location: [place];  Service: Gynecology;  Laterality: N/A;  . ESOPHAGOGASTRODUODENOSCOPY  last one 11-24-2016   dr Karleen Hampshire  . TOOTH EXTRACTION    . WISDOM TOOTH EXTRACTION      Family History  Problem Relation Age of Onset  . Diabetes Mother   . Hypertension Mother   . Heart disease Father        Pacemaker  . Thyroid disease Sister   . Schizophrenia Brother   . Colon cancer Neg Hx   . Esophageal cancer Neg Hx   . Rectal cancer Neg Hx   . Stomach cancer Neg Hx   . Breast cancer Neg Hx     Social History:  reports that  has never smoked. she has never used smokeless tobacco. She reports that she does not drink alcohol or use drugs.  Allergies: No Known Allergies  Medications Prior to Admission  Medication Sig Dispense Refill Last Dose  . baclofen (LIORESAL) 10 MG tablet TAKE 1/2 TABLET 3 TIMES A DAY AS NEEDED FOR MUSCLE SPASM (Patient taking differently: Take 5 mg by mouth 3 (three) times daily. TAKE 1/2 TABLET 3 TIMES A DAY AS NEEDED FOR MUSCLE SPASM) 30 tablet 0 09/21/2017 at 0600  . buPROPion (WELLBUTRIN XL) 150 MG 24 hr tablet TAKE 1 TABLET BY MOUTH EVERY DAY--- takes in am (Patient taking differently: TAKE 1 TABLET BY MOUTH EVERY DAY--- takes med qhs) 30 tablet 0  09/20/2017 at Unknown time  . Calcium Carbonate-Vitamin D (CALTRATE 600+D) 600-400 MG-UNIT tablet Take 1 tablet by mouth 2 (two) times daily. 180 tablet 2 Past Week at Unknown time  . lisinopril (PRINIVIL,ZESTRIL) 20 MG tablet Take 1 tablet (20 mg total) by mouth daily. (Patient taking differently: Take 20 mg by mouth every morning. ) 90 tablet 3 09/20/2017 at Unknown time  . naproxen sodium (ALEVE) 220 MG tablet Take 220-440 mg by mouth 2 (two) times daily as needed (leg/hip pain).   Past Week at Unknown time  . omeprazole (PRILOSEC) 20 MG capsule Take 1 capsule (20 mg total) by mouth 2 (two) times daily. 60 capsule 0 09/21/2017 at 0600  . triamterene-hydrochlorothiazide (DYAZIDE) 37.5-25 MG capsule Take 1 each (1 capsule total) by mouth daily. (Patient taking differently: Take 1 capsule by mouth every morning. ) 90 capsule 3 09/20/2017 at Unknown time  . trolamine salicylate (ASPERCREME/ALOE) 10 % cream Apply 1 application topically as needed for muscle pain. 85 g 0 Past Month at Unknown time  . venlafaxine XR (EFFEXOR-XR) 75 MG 24 hr capsule TAKE 3 CAPSULES EVERY DAY (Patient taking differently: take 225mg  by mouth daily) 90 capsule 0 09/21/2017 at 0600    REVIEW OF SYSTEMS: A ROS was performed and pertinent positives and negatives are included in the history.  GENERAL: No fevers or chills. HEENT: No change in vision, no earache, sore throat or sinus congestion. NECK: No pain or stiffness. CARDIOVASCULAR: No chest pain or pressure. No palpitations. PULMONARY: No shortness of breath, cough or wheeze. GASTROINTESTINAL: No abdominal pain, nausea, vomiting or diarrhea, melena or bright red blood per rectum. GENITOURINARY: No urinary frequency, urgency, hesitancy or dysuria. MUSCULOSKELETAL: No joint or muscle pain, no back pain, no recent trauma. DERMATOLOGIC: No rash, no itching, no lesions. ENDOCRINE: No polyuria, polydipsia, no heat or cold intolerance. No recent change in weight. HEMATOLOGICAL: No anemia or easy  bruising or bleeding. NEUROLOGIC: No headache, seizures, numbness, tingling or weakness. PSYCHIATRIC: No depression, no loss of interest in normal activity or change in sleep pattern.     Blood pressure 124/73, pulse 72, temperature 98.2 F (36.8 C), temperature source Oral, resp. rate 16, height 5\' 3"  (1.6 m), weight 152 lb (68.9 kg), SpO2 100 %.  Physical Exam:  See office notes  Assessment/Plan:  57 y.o. G2P2   1. Adenocarcinoma in situ (AIS) of uterine cervix Endocervical adeno carcinoma in situ on cold knife conization on October 2018.  At that same procedure and endocervical curettage done after the conization was negative, endometrial biopsy benign and a benign cervical polyp was removed.  Patient has a history of left breast cancer in 2002 and has been in menopause since her chemo therapy treatment.  She has had a C-section and a vaginal delivery, with minimal descent of the uterus on exam.  Decision to proceed with robotic total laparoscopic hysterectomy and bilateral salpingo-oophorectomy.  Preop, surgery, risks and benefits, as  well as postop course thoroughly discussed with the patient.  Patient voiced understanding and agreement with plan.  Pamphlet given.                        Patient was counseled as to the risk of surgery to include the following:  1. Infection (prohylactic antibiotics will be administered)  2. DVT/Pulmonary Embolism (prophylactic pneumo compression stockings will be used)  3.Trauma to internal organs requiring additional surgical procedure to repair any injury to internal organs requiring perhaps additional hospitalization days.  4.Hemmorhage requiring transfusion and blood products which carry risks such as anaphylactic reaction, hepatitis and AIDS  Patient had received literature information on the procedure scheduled and all her questions were answered and fully accepts all risk.     [place] Arvetta Araque 09/21/2017, 8:18 AM

## 2017-09-22 ENCOUNTER — Encounter (HOSPITAL_COMMUNITY): Payer: Self-pay | Admitting: Obstetrics & Gynecology

## 2017-09-22 DIAGNOSIS — N870 Mild cervical dysplasia: Secondary | ICD-10-CM | POA: Diagnosis not present

## 2017-09-22 LAB — CBC
HEMATOCRIT: 29.8 % — AB (ref 36.0–46.0)
HEMOGLOBIN: 10.4 g/dL — AB (ref 12.0–15.0)
MCH: 31.2 pg (ref 26.0–34.0)
MCHC: 34.9 g/dL (ref 30.0–36.0)
MCV: 89.5 fL (ref 78.0–100.0)
Platelets: 241 10*3/uL (ref 150–400)
RBC: 3.33 MIL/uL — ABNORMAL LOW (ref 3.87–5.11)
RDW: 12.3 % (ref 11.5–15.5)
WBC: 11.8 10*3/uL — AB (ref 4.0–10.5)

## 2017-09-22 MED ORDER — OXYCODONE-ACETAMINOPHEN 5-325 MG PO TABS
ORAL_TABLET | ORAL | Status: AC
  Filled 2017-09-22: qty 1

## 2017-09-22 MED ORDER — OXYCODONE-ACETAMINOPHEN 7.5-325 MG PO TABS: 1.0000 | ORAL_TABLET | Freq: Four times a day (QID) | ORAL | 0 refills | Status: DC | PRN

## 2017-09-22 MED ORDER — IBUPROFEN 200 MG PO TABS
ORAL_TABLET | ORAL | Status: AC
  Filled 2017-09-22: qty 3

## 2017-09-22 NOTE — Discharge Summary (Signed)
Physician Discharge Summary  Patient ID: Sherry Anderson MRN: [id number] DOB/AGE: Feb 02, 1961 57 y.o.  Admit date: 09/21/2017 Discharge date: 09/22/2017  Admission Diagnoses: high grade dysplasia, adenocarcinoma in situ of the endocervix   Discharge Diagnoses: Same and intestinal and appendix adhesions with abdominal-pelvic walls Active Problems:   Post-operative state  Discharged Condition: good  Consults:None  Significant Diagnostic Studies: None  Treatments:surgery: Robotic Total Laparoscopic Hysterectomy with bilateral salpingo-oophorectomy and lysis of adhesions  Vitals:   09/22/17 0303 09/22/17 0541  BP: 105/60 (!) 114/59  Pulse: 72 73  Resp: 16 16  Temp: 98.6 F (37 C) 98.8 F (37.1 C)  SpO2: 100% 100%     No intake/output data recorded.   Hospital Course: Good  Discharge Exam: Good  Disposition: 01-Home or Self Care  Discharge Instructions    Discharge patient   Complete by:  As directed    Discharge disposition:  01-Home or Self Care   Discharge patient date:  09/22/2017       Allergies as of 09/22/2017   No Known Allergies     Medication List    TAKE these medications   baclofen 10 MG tablet Commonly known as:  LIORESAL TAKE 1/2 TABLET 3 TIMES A DAY AS NEEDED FOR MUSCLE SPASM What changed:    how much to take  how to take this  when to take this  additional instructions   buPROPion 150 MG 24 hr tablet Commonly known as:  WELLBUTRIN XL TAKE 1 TABLET BY MOUTH EVERY DAY--- takes in am What changed:  additional instructions   Calcium Carbonate-Vitamin D 600-400 MG-UNIT tablet Commonly known as:  CALTRATE 600+D Take 1 tablet by mouth 2 (two) times daily.   lisinopril 20 MG tablet Commonly known as:  PRINIVIL,ZESTRIL Take 1 tablet (20 mg total) by mouth daily. What changed:  when to take this   naproxen sodium 220 MG tablet Commonly known as:  ALEVE Take 220-440 mg by mouth 2 (two) times daily as needed (leg/hip pain).    omeprazole 20 MG capsule Commonly known as:  PRILOSEC Take 1 capsule (20 mg total) by mouth 2 (two) times daily.   oxyCODONE-acetaminophen 7.5-325 MG tablet Commonly known as:  PERCOCET Take 1 tablet by mouth every 6 (six) hours as needed for severe pain.   triamterene-hydrochlorothiazide 37.5-25 MG capsule Commonly known as:  DYAZIDE Take 1 each (1 capsule total) by mouth daily. What changed:  when to take this   trolamine salicylate 10 % cream Commonly known as:  ASPERCREME/ALOE Apply 1 application topically as needed for muscle pain.   venlafaxine XR 75 MG 24 hr capsule Commonly known as:  EFFEXOR-XR TAKE 3 CAPSULES EVERY DAY What changed:  See the new instructions.        Follow-up Information    Princess Bruins, MD Follow up in 3 week(s).   Specialty:  Obstetrics and Gynecology Contact information: [address] [place] [address] [phone]            Signed: Princess Bruins 09/22/2017, 8:24 AM

## 2017-09-22 NOTE — Progress Notes (Signed)
POD #1  Subjective: Patient reports tolerating PO and no problems voiding.    Objective: I have reviewed patient's vital signs.  vital signs, intake and output, medications and labs.  Vitals:   09/22/17 0303 09/22/17 0541  BP: 105/60 (!) 114/59  Pulse: 72 73  Resp: 16 16  Temp: 98.6 F (37 C) 98.8 F (37.1 C)  SpO2: 100% 100%   I/O last 3 completed shifts: In: 4262.9 [P.O.:740; I.V.:3322.9; IV Piggyback:200] Out: 1595 [Urine:1575; Blood:20] No intake/output data recorded.  Results for orders placed or performed during the hospital encounter of 09/21/17 (from the past 24 hour(s))  BLOOD TRANSFUSION REPORT - SCANNED     Status: None   Collection Time: 09/21/17 12:06 PM   Narrative   Ordered by an unspecified provider.  CBC     Status: Abnormal   Collection Time: 09/22/17  5:17 AM  Result Value Ref Range   WBC 11.8 (H) 4.0 - 10.5 K/uL   RBC 3.33 (L) 3.87 - 5.11 MIL/uL   Hemoglobin 10.4 (L) 12.0 - 15.0 g/dL   HCT 29.8 (L) 36.0 - 46.0 %   MCV 89.5 78.0 - 100.0 fL   MCH 31.2 26.0 - 34.0 pg   MCHC 34.9 30.0 - 36.0 g/dL   RDW 12.3 11.5 - 15.5 %   Platelets 241 150 - 400 K/uL    EXAM General: alert and cooperative Resp: clear to auscultation bilaterally Cardio: regular rate and rhythm GI: soft, non-tender; bowel sounds normal; no masses,  no organomegaly and incision: clean, dry and intact Extremities: no edema, redness or tenderness in the calves or thighs Vaginal Bleeding: none  Assessment: s/p Procedure(s): XI ROBOTIC ASSISTED TOTAL HYSTERECTOMY WITH BILATERAL SALPINGO OOPHORECTOMY, Lysis of Adhesions: stable and progressing well  Plan: Advance diet Encourage ambulation Discharge home  LOS: 0 days    Princess Bruins, MD 09/22/2017 8:23 AM

## 2017-09-28 ENCOUNTER — Other Ambulatory Visit: Payer: Self-pay | Admitting: Gynecology

## 2017-09-28 ENCOUNTER — Other Ambulatory Visit: Payer: Self-pay | Admitting: Family Medicine

## 2017-10-10 ENCOUNTER — Ambulatory Visit (INDEPENDENT_AMBULATORY_CARE_PROVIDER_SITE_OTHER): Payer: Commercial Managed Care - PPO | Admitting: Women's Health

## 2017-10-10 ENCOUNTER — Encounter: Payer: Self-pay | Admitting: Women's Health

## 2017-10-10 VITALS — BP 122/80

## 2017-10-10 DIAGNOSIS — Z789 Other specified health status: Secondary | ICD-10-CM

## 2017-10-10 NOTE — Progress Notes (Signed)
57 yo MHF presents with complaint of questionable incisional infection. 09/21/2017 robotic-assisted TAH with BSO for adenocarcinoma in situ, margins neg.  Reports minimal to no pain. Denies vaginal discharge, urinary symptoms, abdominal pain, nausea, constipation or fever. Reports normal urination and bowel elimination. Medical problems include hypertension and GERD.  Exam: Appears well. Abdomen soft, laparoscopic incisions well approximated, without erythema or drainage. Left distal laparoscopic area less than .5 centimeter superficial open area noted, no erythema, area surrounding soft, without warmth or visible infection.  Healing laparoscopic incisions  Plan: Reassurance given regarding normality of healing incisions. Left distal laparoscopic area cleansed with alcohol, small amount of triple antibiotic ointment applied, encouraged to wear loose clothing, avoid lifting rubs. Keep scheduled follow-up appointment with Dr. Dellis Filbert

## 2017-10-14 ENCOUNTER — Encounter: Payer: Self-pay | Admitting: Obstetrics & Gynecology

## 2017-10-14 ENCOUNTER — Ambulatory Visit (INDEPENDENT_AMBULATORY_CARE_PROVIDER_SITE_OTHER): Payer: Commercial Managed Care - PPO | Admitting: Obstetrics & Gynecology

## 2017-10-14 VITALS — BP 130/80

## 2017-10-14 DIAGNOSIS — Z09 Encounter for follow-up examination after completed treatment for conditions other than malignant neoplasm: Secondary | ICD-10-CM

## 2017-10-14 NOTE — Patient Instructions (Signed)
1. Follow-up examination after gynecological surgery Very good postop evolution.  No complication.  Pathology discussed again with patient, reassurance given that the cervix showed only residual CIN 1, mild dysplasia.  Patient reassured that there was no cancer.  Will follow up again in 3 weeks to reassess the vaginal vault.  Nothing in vagina for 8 weeks postop.  Will probably resume work at 6 weeks postop, given that her work involves heavy lifting.  Sherry Anderson, un placer verle hoy!

## 2017-10-14 NOTE — Progress Notes (Signed)
    Trystyn Sitts Dec 29, 1960 [id number]        57 y.o.  G2P2L3 Single  RP: 3 weeks postop Robotic TLH/BSO for Cervical Adenocarcinoma in situ  HPI: Very good postop evolution with no abdominal pelvic pain.  No vaginal bleeding.  No abnormal vaginal discharge.  Abdominal incisions healing well with no discharge, no redness and no tenderness.  No fever.  Urine and bowel movements normal.   OB History  Gravida Para Term Preterm AB Living  2         3  SAB TAB Ectopic Multiple Live Births        1      # Outcome Date GA Lbr Len/2nd Weight Sex Delivery Anes PTL Lv  2 Gravida           1 Gravida               Past medical history,surgical history, problem list, medications, allergies, family history and social history were all reviewed and documented in the EPIC chart.   Directed ROS with pertinent positives and negatives documented in the history of present illness/assessment and plan.  Exam:  Vitals:   10/14/17 1008  BP: 130/80   General appearance:  Normal  Abdomen: Incisions closed, no erythema, no discharge, NT.  Gynecologic exam: Vulva normal.  Speculum:  Vaginal vault well closed, no erythema, no bleeding, no vaginal discharge.  Patho 09/21/2017:  Uterus +/- tubes/ovaries, neoplastic, cervix CERVIX: - LOW GRADE SQUAMOUS INTRAEPITHELIAL LESION (CIN1, MILD DYSPLASIA) - NABOTHIAN CYSTS - PREVIOUS SURGICAL SITE CHANGES - NO MALIGNANCY IDENTIFIED - SEE COMMENT UTERUS: - DISORDERED PROLIFERATIVE ENDOMETRIUM WITH FOCAL GLANDULAR CROWDING - LEIOMYOMA (0.5 CM) - NO HYPERPLASIA OR MALIGNANCY IDENTIFIED BILATERAL OVARIES: - BENIGN FIBROMA (RIGHT) - BENIGN SEROUS CYSTADENOMA (LEFT) - NO CARCINOMA IDENTIFIED BILATERAL FALLOPIAN TUBES: - BENIGN FALLOPIAN TUBES - NO CARCINOMA IDENTIFIED   Assessment/Plan:  57 y.o. G2P0   1. Follow-up examination after gynecological surgery Very good postop evolution.  No complication.  Pathology discussed again with patient,  reassurance given that the cervix showed only residual CIN 1, mild dysplasia.  Patient reassured that there was no cancer.  Will follow up again in 3 weeks to reassess the vaginal vault.  Nothing in vagina for 8 weeks postop.  Will probably resume work at 6 weeks postop, given that her work involves heavy lifting.  Princess Bruins MD, 10:13 AM 10/14/2017

## 2017-10-29 ENCOUNTER — Other Ambulatory Visit: Payer: Self-pay | Admitting: Family Medicine

## 2017-11-02 ENCOUNTER — Encounter: Payer: Self-pay | Admitting: Obstetrics & Gynecology

## 2017-11-02 ENCOUNTER — Ambulatory Visit (INDEPENDENT_AMBULATORY_CARE_PROVIDER_SITE_OTHER): Payer: Commercial Managed Care - PPO | Admitting: Obstetrics & Gynecology

## 2017-11-02 VITALS — BP 128/86

## 2017-11-02 DIAGNOSIS — F329 Major depressive disorder, single episode, unspecified: Secondary | ICD-10-CM | POA: Diagnosis not present

## 2017-11-02 DIAGNOSIS — R5383 Other fatigue: Secondary | ICD-10-CM | POA: Diagnosis not present

## 2017-11-02 DIAGNOSIS — Z09 Encounter for follow-up examination after completed treatment for conditions other than malignant neoplasm: Secondary | ICD-10-CM

## 2017-11-02 NOTE — Patient Instructions (Signed)
1. Follow-up examination after gynecological surgery Excellent postop healing.  No complication.  Vaginal vault well healted.  Can resume work, physical and sexual activity, although not planning on being sexually active at this time.  2. Fatigue due to depression Major depression symptoms not controled with effexor-XR 75 mg 3 tab daily.  No suicidal ideation.  Recommend urgent reevaluation with Dr Ardelia Mems who is managing her depression.  R/O Anemia, electrolyte imbalance and thyroid dysfunction.   - CBC - Comp Met (CMET) - TSH  Sherry Anderson, fue un placer verle de nuevo hoy!  Voy a informarle de sus [employer]!

## 2017-11-02 NOTE — Progress Notes (Signed)
    Sherry Anderson 06/04/1961 [id number]        57 y.o.  G2P2L3 Single.  Taking care of her aging parents.  RP: Post op 6 weeks Robotic TLH/BSO for Cervical AdenoCarcinoma in situ  HPI: Doing very well physically postop with no pelvic pain, no vaginal bleeding, normal vaginal secretions and no fever.  But feeling depressed in spite of Effexor-XL 75 mg 3 tab daily.  Crying often, tired, difficulty sleeping.  No suicidal ideations.  H/O Depression followed by Dr Ardelia Mems.   OB History  Gravida Para Term Preterm AB Living  2         3  SAB TAB Ectopic Multiple Live Births        1      # Outcome Date GA Lbr Len/2nd Weight Sex Delivery Anes PTL Lv  2 Gravida           1 Gravida               Past medical history,surgical history, problem list, medications, allergies, family history and social history were all reviewed and documented in the EPIC chart.   Directed ROS with pertinent positives and negatives documented in the history of present illness/assessment and plan.  Exam:  Vitals:   11/02/17 0958  BP: 128/86   General appearance:  Normal  Abdomen: Soft, NT.  Incision well healed.  Gynecologic exam: Vulva normal.  Speculum:  Vaginal vault well-closed.  No bleeding, normal vaginal secretions.  Bimanual exam:  No pelvic mass, NT.   Assessment/Plan:  57 y.o. G2P0   1. Follow-up examination after gynecological surgery Excellent postop healing.  No complication.  Vaginal vault well healted.  Can resume work, physical and sexual activity, although not planning on being sexually active at this time.  2. Fatigue due to depression Major depression symptoms not controled with effexor-XR 75 mg 3 tab daily.  No suicidal ideation.  Recommend urgent reevaluation with Dr Ardelia Mems who is managing her depression.  R/O Anemia, electrolyte imbalance and thyroid dysfunction.   - CBC - Comp Met (CMET) - TSH  Counseling on above issues and coordination of care  >50% x 15  minutes.  Princess Bruins MD, 10:03 AM 11/02/2017

## 2017-11-03 NOTE — Addendum Note (Signed)
Addended by: Alen Blew on: 11/03/2017 09:29 AM   Modules accepted: Level of Service

## 2017-11-07 LAB — TSH: TSH: 5.58 m[IU]/L — AB (ref 0.40–4.50)

## 2017-11-07 LAB — TEST AUTHORIZATION

## 2017-11-07 LAB — CBC
HCT: 37.1 % (ref 35.0–45.0)
Hemoglobin: 13 g/dL (ref 11.7–15.5)
MCH: 30.2 pg (ref 27.0–33.0)
MCHC: 35 g/dL (ref 32.0–36.0)
MCV: 86.3 fL (ref 80.0–100.0)
MPV: 11 fL (ref 7.5–12.5)
PLATELETS: 291 10*3/uL (ref 140–400)
RBC: 4.3 10*6/uL (ref 3.80–5.10)
RDW: 12.6 % (ref 11.0–15.0)
WBC: 4.1 10*3/uL (ref 3.8–10.8)

## 2017-11-07 LAB — T3, FREE: T3, Free: 2.8 pg/mL (ref 2.3–4.2)

## 2017-11-07 LAB — COMPREHENSIVE METABOLIC PANEL
AG Ratio: 1.4 (calc) (ref 1.0–2.5)
ALBUMIN MSPROF: 4.3 g/dL (ref 3.6–5.1)
ALKALINE PHOSPHATASE (APISO): 59 U/L (ref 33–130)
ALT: 43 U/L — AB (ref 6–29)
AST: 29 U/L (ref 10–35)
BILIRUBIN TOTAL: 0.5 mg/dL (ref 0.2–1.2)
BUN: 12 mg/dL (ref 7–25)
CALCIUM: 9.5 mg/dL (ref 8.6–10.4)
CO2: 28 mmol/L (ref 20–32)
CREATININE: 0.96 mg/dL (ref 0.50–1.05)
Chloride: 103 mmol/L (ref 98–110)
Globulin: 3 g/dL (calc) (ref 1.9–3.7)
Glucose, Bld: 94 mg/dL (ref 65–99)
POTASSIUM: 4 mmol/L (ref 3.5–5.3)
Sodium: 140 mmol/L (ref 135–146)
Total Protein: 7.3 g/dL (ref 6.1–8.1)

## 2017-11-07 LAB — T4, FREE: Free T4: 1.1 ng/dL (ref 0.8–1.8)

## 2017-11-10 ENCOUNTER — Encounter: Payer: Self-pay | Admitting: Anesthesiology

## 2017-11-15 ENCOUNTER — Ambulatory Visit: Payer: Commercial Managed Care - PPO | Admitting: Family Medicine

## 2017-11-15 DIAGNOSIS — E039 Hypothyroidism, unspecified: Secondary | ICD-10-CM | POA: Diagnosis not present

## 2017-11-15 DIAGNOSIS — E038 Other specified hypothyroidism: Secondary | ICD-10-CM

## 2017-11-15 DIAGNOSIS — F418 Other specified anxiety disorders: Secondary | ICD-10-CM | POA: Diagnosis not present

## 2017-11-15 MED ORDER — BUPROPION HCL ER (XL) 150 MG PO TB24: 300.0000 mg | ORAL_TABLET | Freq: Every day | ORAL | 1 refills | Status: DC

## 2017-11-15 MED ORDER — LEVOTHYROXINE SODIUM 50 MCG PO TABS: 50.0000 ug | ORAL_TABLET | Freq: Every day | ORAL | 1 refills | Status: DC

## 2017-11-15 NOTE — Progress Notes (Signed)
Date of Visit: 11/15/2017   HPI:  Patient presents for routine follow up. Since I last saw her she has undergone hysterectomy for cervical adenocarcinoma in situ.  Labs: Patient recently had labs done at her GYN office and would like me to go over these with her.  Her TSH was a little bit elevated with a normal T4 and T3.  She does admit to feeling depressed, having dry skin, and feeling very tired.  She is agreeable to trying a course of levothyroxine to see if this improves her symptoms.  Labs also showed mildly elevated ALT at 43.  Depression: Currently taking Wellbutrin 150 mg daily and venlafaxine to 25 mg daily.  Feels depressed.  Her father is older and has worsening dementia.  She cares for him.  Denies thoughts of harming herself or others.  Agreeable to adjusting her medications today.  ROS: See HPI.  [place]: history of depression, breast carcinoma in situ, hyperlipidemia, hypertension, cervical adenocarcinoma in situ  PHYSICAL EXAM: BP 130/85 (BP Location: Left Arm, Patient Position: Sitting, Cuff Size: Normal)   Pulse 75   Temp 98.1 F (36.7 C) (Oral)   Wt 151 lb 12.8 oz (68.9 kg)   LMP  (LMP Unknown)   SpO2 99%   BMI 26.89 kg/m  Gen: No acute distress, pleasant, cooperative HEENT: Normocephalic, atraumatic Heart: Regular rate and rhythm, no murmur Lungs: Clear bilaterally, normal effort Neuro: Alert, grossly nonfocal, speech normal Ext: No edema bilaterally Psych: normal range of affect, well groomed, speech normal in rate and volume, normal eye contact   ASSESSMENT/PLAN:  Health maintenance:  -Current on health maintenance items  Depression with anxiety Uncontrolled.  Increase Wellbutrin to 300 mg daily.  Continue Effexor at present dose.  Follow-up in 6 weeks.  Subclinical hypothyroidism Patient is endorsing symptoms that could be attributed to subclinical hypothyroidism.  With her elevated TSH, I think it is reasonable to do a trial of low-dose Synthroid to see  if it helps her symptoms.  Start Synthroid 50 mcg daily.  Follow-up in 6 weeks with plan to recheck TSH at that time.  Elevated ALT: Mildly elevated at 43 on recent lab draw.  We will plan to recheck this with labs in 6 weeks.  Plan at that time will be to get a TSH, free T4, complete metabolic panel, lipid panel, and vitamin D.  Patient has been instructed to come fasting for that visit.  FOLLOW UP: Follow up in 6 weeks for above issues  [place] J. Ardelia Mems, [place]

## 2017-11-15 NOTE — Patient Instructions (Signed)
Sent in low dose of thyroid medication to take Will need to recheck labs in 6 weeks  Increase wellbutrin to 2 pills (300mg  total per day).  Follow up in 6 weeks with me, come fasting.  Be well, Dr. Ardelia Mems

## 2017-11-18 DIAGNOSIS — E038 Other specified hypothyroidism: Secondary | ICD-10-CM | POA: Insufficient documentation

## 2017-11-18 DIAGNOSIS — E039 Hypothyroidism, unspecified: Secondary | ICD-10-CM | POA: Insufficient documentation

## 2017-11-18 NOTE — Assessment & Plan Note (Signed)
Patient is endorsing symptoms that could be attributed to subclinical hypothyroidism.  With her elevated TSH, I think it is reasonable to do a trial of low-dose Synthroid to see if it helps her symptoms.  Start Synthroid 50 mcg daily.  Follow-up in 6 weeks with plan to recheck TSH at that time.

## 2017-11-18 NOTE — Assessment & Plan Note (Signed)
Uncontrolled.  Increase Wellbutrin to 300 mg daily.  Continue Effexor at present dose.  Follow-up in 6 weeks.

## 2017-12-02 ENCOUNTER — Other Ambulatory Visit: Payer: Self-pay | Admitting: Family Medicine

## 2017-12-08 ENCOUNTER — Other Ambulatory Visit: Payer: Self-pay | Admitting: Family Medicine

## 2017-12-26 ENCOUNTER — Other Ambulatory Visit: Payer: Self-pay | Admitting: Family Medicine

## 2017-12-26 DIAGNOSIS — E039 Hypothyroidism, unspecified: Secondary | ICD-10-CM

## 2017-12-26 DIAGNOSIS — E785 Hyperlipidemia, unspecified: Secondary | ICD-10-CM

## 2017-12-26 DIAGNOSIS — F32A Depression, unspecified: Secondary | ICD-10-CM

## 2017-12-26 DIAGNOSIS — F329 Major depressive disorder, single episode, unspecified: Secondary | ICD-10-CM

## 2017-12-26 DIAGNOSIS — E038 Other specified hypothyroidism: Secondary | ICD-10-CM

## 2017-12-27 ENCOUNTER — Other Ambulatory Visit: Payer: Commercial Managed Care - PPO

## 2017-12-27 DIAGNOSIS — F32A Depression, unspecified: Secondary | ICD-10-CM

## 2017-12-27 DIAGNOSIS — E038 Other specified hypothyroidism: Secondary | ICD-10-CM

## 2017-12-27 DIAGNOSIS — E785 Hyperlipidemia, unspecified: Secondary | ICD-10-CM

## 2017-12-27 DIAGNOSIS — E039 Hypothyroidism, unspecified: Secondary | ICD-10-CM

## 2017-12-27 DIAGNOSIS — F329 Major depressive disorder, single episode, unspecified: Secondary | ICD-10-CM

## 2017-12-28 LAB — CMP14+EGFR
A/G RATIO: 1.7 (ref 1.2–2.2)
ALK PHOS: 57 IU/L (ref 39–117)
ALT: 25 IU/L (ref 0–32)
AST: 22 IU/L (ref 0–40)
Albumin: 4.3 g/dL (ref 3.5–5.5)
BUN / CREAT RATIO: 16 (ref 9–23)
BUN: 16 mg/dL (ref 6–24)
CHLORIDE: 102 mmol/L (ref 96–106)
CO2: 23 mmol/L (ref 20–29)
Calcium: 9.3 mg/dL (ref 8.7–10.2)
Creatinine, Ser: 1 mg/dL (ref 0.57–1.00)
GFR calc non Af Amer: 63 mL/min/{1.73_m2} (ref >59)
GFR, EST AFRICAN AMERICAN: 72 mL/min/{1.73_m2} (ref >59)
GLUCOSE: 86 mg/dL (ref 65–99)
Globulin, Total: 2.6 g/dL (ref 1.5–4.5)
Potassium: 4 mmol/L (ref 3.5–5.2)
Sodium: 140 mmol/L (ref 134–144)
TOTAL PROTEIN: 6.9 g/dL (ref 6.0–8.5)

## 2017-12-28 LAB — LIPID PANEL
CHOL/HDL RATIO: 2.8 ratio (ref 0.0–4.4)
Cholesterol, Total: 210 mg/dL — ABNORMAL HIGH (ref 100–199)
HDL: 75 mg/dL (ref >39)
LDL Calculated: 118 mg/dL — ABNORMAL HIGH (ref 0–99)
TRIGLYCERIDES: 83 mg/dL (ref 0–149)
VLDL CHOLESTEROL CAL: 17 mg/dL (ref 5–40)

## 2017-12-28 LAB — T4, FREE: Free T4: 0.96 ng/dL (ref 0.82–1.77)

## 2017-12-28 LAB — VITAMIN D 25 HYDROXY (VIT D DEFICIENCY, FRACTURES): Vit D, 25-Hydroxy: 38.1 ng/mL (ref 30.0–100.0)

## 2017-12-28 LAB — TSH: TSH: 6.11 u[IU]/mL — ABNORMAL HIGH (ref 0.450–4.500)

## 2017-12-30 ENCOUNTER — Other Ambulatory Visit: Payer: Self-pay | Admitting: Family Medicine

## 2018-01-02 NOTE — Telephone Encounter (Signed)
LMOVM with spanish interpretor (omar [phone]) for pt to call back and schedule and appt with dr Ardelia Mems. Deseree Kennon Holter, CMA

## 2018-01-02 NOTE — Telephone Encounter (Signed)
Please ask patient to schedule a follow up visit with me, thanks! Tonae Livolsi J Saxon Crosby, MD  

## 2018-01-03 ENCOUNTER — Other Ambulatory Visit: Payer: Self-pay | Admitting: Family Medicine

## 2018-01-04 NOTE — Telephone Encounter (Signed)
Attempted to call pt again to scheduled an appt with dr Ardelia Mems, LMOVM will spanish interpretor Mauricio Po [phone]). Deseree Kennon Holter, CMA

## 2018-01-05 MED ORDER — LISINOPRIL 20 MG PO TABS: 20.0000 mg | ORAL_TABLET | Freq: Every day | ORAL | 0 refills | Status: DC

## 2018-01-07 ENCOUNTER — Other Ambulatory Visit: Payer: Self-pay

## 2018-01-07 ENCOUNTER — Emergency Department (HOSPITAL_COMMUNITY): Payer: Commercial Managed Care - PPO

## 2018-01-07 ENCOUNTER — Emergency Department (HOSPITAL_COMMUNITY)
Admission: EM | Admit: 2018-01-07 | Discharge: 2018-01-08 | Disposition: A | Discharge status: Home / Self Care | Payer: Commercial Managed Care - PPO | Attending: Emergency Medicine | Admitting: Emergency Medicine

## 2018-01-07 ENCOUNTER — Encounter (HOSPITAL_COMMUNITY): Payer: Self-pay | Admitting: Emergency Medicine

## 2018-01-07 ENCOUNTER — Emergency Department (HOSPITAL_BASED_OUTPATIENT_CLINIC_OR_DEPARTMENT_OTHER)
Admit: 2018-01-07 | Discharge: 2018-01-07 | Disposition: A | Discharge status: Home / Self Care | Payer: Commercial Managed Care - PPO | Attending: Emergency Medicine | Admitting: Emergency Medicine

## 2018-01-07 DIAGNOSIS — I10 Essential (primary) hypertension: Secondary | ICD-10-CM | POA: Diagnosis not present

## 2018-01-07 DIAGNOSIS — R0602 Shortness of breath: Secondary | ICD-10-CM | POA: Insufficient documentation

## 2018-01-07 DIAGNOSIS — Z79899 Other long term (current) drug therapy: Secondary | ICD-10-CM | POA: Diagnosis not present

## 2018-01-07 DIAGNOSIS — R42 Dizziness and giddiness: Secondary | ICD-10-CM

## 2018-01-07 DIAGNOSIS — E876 Hypokalemia: Secondary | ICD-10-CM | POA: Diagnosis not present

## 2018-01-07 DIAGNOSIS — R61 Generalized hyperhidrosis: Secondary | ICD-10-CM | POA: Insufficient documentation

## 2018-01-07 DIAGNOSIS — Z853 Personal history of malignant neoplasm of breast: Secondary | ICD-10-CM | POA: Insufficient documentation

## 2018-01-07 DIAGNOSIS — R112 Nausea with vomiting, unspecified: Secondary | ICD-10-CM | POA: Diagnosis not present

## 2018-01-07 DIAGNOSIS — R609 Edema, unspecified: Secondary | ICD-10-CM

## 2018-01-07 DIAGNOSIS — M7989 Other specified soft tissue disorders: Secondary | ICD-10-CM | POA: Insufficient documentation

## 2018-01-07 LAB — CBC
HCT: 34.1 % — ABNORMAL LOW (ref 36.0–46.0)
Hemoglobin: 11.4 g/dL — ABNORMAL LOW (ref 12.0–15.0)
MCH: 29.9 pg (ref 26.0–34.0)
MCHC: 33.4 g/dL (ref 30.0–36.0)
MCV: 89.5 fL (ref 78.0–100.0)
PLATELETS: 267 10*3/uL (ref 150–400)
RBC: 3.81 MIL/uL — ABNORMAL LOW (ref 3.87–5.11)
RDW: 12.4 % (ref 11.5–15.5)
WBC: 5 10*3/uL (ref 4.0–10.5)

## 2018-01-07 LAB — I-STAT TROPONIN, ED
Troponin i, poc: 0 ng/mL (ref 0.00–0.08)
Troponin i, poc: 0.02 ng/mL (ref 0.00–0.08)

## 2018-01-07 LAB — BASIC METABOLIC PANEL
Anion gap: 11 (ref 5–15)
BUN: 19 mg/dL (ref 6–20)
CHLORIDE: 101 mmol/L (ref 101–111)
CO2: 29 mmol/L (ref 22–32)
CREATININE: 1 mg/dL (ref 0.44–1.00)
Calcium: 9.3 mg/dL (ref 8.9–10.3)
GFR calc Af Amer: >60 mL/min (ref >60)
GFR calc non Af Amer: >60 mL/min (ref >60)
Glucose, Bld: 101 mg/dL — ABNORMAL HIGH (ref 65–99)
Potassium: 3.3 mmol/L — ABNORMAL LOW (ref 3.5–5.1)
SODIUM: 141 mmol/L (ref 135–145)

## 2018-01-07 MED ORDER — SODIUM CHLORIDE 0.9 % IV BOLUS
1000.0000 mL | Freq: Once | INTRAVENOUS | Status: AC
  Administered 2018-01-07: 1000 mL via INTRAVENOUS

## 2018-01-07 MED ORDER — POTASSIUM CHLORIDE CRYS ER 20 MEQ PO TBCR
40.0000 meq | EXTENDED_RELEASE_TABLET | Freq: Once | ORAL | Status: AC
  Administered 2018-01-07: 40 meq via ORAL
  Filled 2018-01-07: qty 2

## 2018-01-07 MED ORDER — ONDANSETRON HCL 4 MG/2ML IJ SOLN
4.0000 mg | Freq: Once | INTRAMUSCULAR | Status: DC | PRN
  Filled 2018-01-07: qty 2

## 2018-01-07 NOTE — ED Triage Notes (Signed)
Patient complains of sudden onset of dizziness, nausea, and vomiting at 1130. Patient states she feels a little better now after vomiting but still unwell.  Patient denies chest pain, complains of shortness of breath.

## 2018-01-07 NOTE — Progress Notes (Signed)
Right lower extremity venous duplex has been completed. Negative for DVT. Results were given to Avie Echevaria PA.  01/07/18 6:19 PM Carlos Levering RVT

## 2018-01-07 NOTE — ED Provider Notes (Signed)
[facility] EMERGENCY DEPARTMENT Provider Note   CSN: [id number] Arrival date & time: 01/07/18  1320     History   Chief Complaint Chief Complaint  Patient presents with  . Dizziness    HPI [name] is a 57 y.o. female with past medical history significant for breast cancer in remission for 17 years, anxiety and depression, diverticulosis, hypertension, GERD, presenting with sudden onset lightheadedness, dizziness, diaphoresis, nausea vomiting which lasted approximately 2 hours while at work this morning around 11:30 AM.  She reports that she improved since but is still experiencing some dizziness when she stands.  She denies any chest pain, she does report some shortness of breath.  She has had changes in medications approximately 2 months ago but recently has been taking a lot of pain medications due to ongoing intermittent edema to the back of her knee and her right calf with pain aggravated by walking.  She denies any head trauma or loss of consciousness, no headache or visual disturbances, weakness or numbness.  She denies any nausea at this time.   HPI  Past Medical History:  Diagnosis Date  . Adenocarcinoma in situ (AIS) of uterine cervix 05/2017   Associated with high-grade dysplasia  . Anxiety and depression   . Cancer [facility]) 2002   left breast cancer   . Depression   . Diverticulosis of colon   . Essential hypertension, benign   . GERD (gastroesophageal reflux disease)   . High grade squamous intraepithelial cervical dysplasia 05/2017   Associated with adenocarcinoma in situ  . History of adenomatous polyp of colon    tubular adenoma's  02-08-2017  . History of gastritis   . History of left breast cancer 2002---- per pt no recurrence   dx DCIS --- s/p  left breast lumpectomy;  per pt radiation therapy completed same year and completed antiestrogen therapy   . History of radiation therapy    2002   left breast  . SVD (spontaneous vaginal  delivery)    x 1    Patient Active Problem List   Diagnosis Date Noted  . Subclinical hypothyroidism 11/18/2017  . Post-operative state 09/21/2017  . Atypical cervical glandular cells 04/21/2017  . Healthcare maintenance 04/04/2017  . Right-sided face pain 03/02/2017  . Tremor of right hand 11/07/2015  . Right-sided sensorineural hearing loss 11/13/2014  . DDD (degenerative disc disease), lumbar 10/21/2014  . URI (upper respiratory infection) 05/20/2014  . Knee pain, bilateral 11/06/2013  . Onychomycosis 11/06/2013  . Left wrist pain 03/02/2013  . Exertional angina (HCC) 03/30/2012  . Anemia 01/25/2012  . Right ear pain 01/19/2012  . HEADACHE 02/04/2010  . Hyperlipidemia 08/07/2009  . HEARING LOSS, RIGHT EAR 01/17/2008  . CARPAL TUNNEL SYNDROME, BILATERAL 07/31/2007  . LATERAL EPICONDYLITIS, BILATERAL 07/18/2007  . Fatigue due to depression 04/06/2007  . Gastritis and gastroduodenitis 11/21/2006  . Depression with anxiety 10/17/2006  . HYPERTENSION, BENIGN ESSENTIAL 10/17/2006  . CA IN SITU, BREAST 02/15/2001    Past Surgical History:  Procedure Laterality Date  . BREAST LUMPECTOMY Left 2002   ductal CA in situ  . CERVICAL CONIZATION W/BX N/A 06/08/2017   Procedure: CONIZATION CERVIX WITH BIOPSY;  Surgeon: Anastasio Auerbach, MD;  Location: [facility];  Service: Gynecology;  Laterality: N/A;  . [place]   twins  . COLONOSCOPY  02-08-2017   dr Karleen Hampshire  . DILATATION & CURETTAGE/HYSTEROSCOPY WITH MYOSURE N/A 06/08/2017   Procedure: DILATATION & CURETTAGE/HYSTEROSCOPY WITH MYOSURE;  Surgeon: Anastasio Auerbach, MD;  Location: [facility];  Service: Gynecology;  Laterality: N/A;  . ESOPHAGOGASTRODUODENOSCOPY  last one 11-24-2016   dr Karleen Hampshire  . ROBOTIC ASSISTED TOTAL HYSTERECTOMY WITH BILATERAL SALPINGO OOPHERECTOMY N/A 09/21/2017   Procedure: XI ROBOTIC ASSISTED TOTAL HYSTERECTOMY WITH BILATERAL SALPINGO OOPHORECTOMY, Lysis of  Adhesions;  Surgeon: Princess Bruins, MD;  Location: [facility];  Service: Gynecology;  Laterality: N/A;  . TOOTH EXTRACTION    . WISDOM TOOTH EXTRACTION       OB History    Gravida  2   Para      Term      Preterm      AB      Living  3     SAB      TAB      Ectopic      Multiple  1   Live Births               Home Medications    Prior to Admission medications   Medication Sig Start Date End Date Taking? Authorizing Provider  acetaminophen (TYLENOL) 500 MG tablet Take 1,000 mg by mouth every 6 (six) hours as needed (pain).   Yes [provider]  buPROPion (WELLBUTRIN XL) 150 MG 24 hr tablet Take 2 tablets (300 mg total) by mouth daily. 11/15/17  Yes Leeanne Rio, MD  Calcium Carbonate-Vitamin D (CALTRATE 600+D) 600-400 MG-UNIT tablet Take 1 tablet by mouth 2 (two) times daily. Patient taking differently: Take 2 tablets by mouth daily.  10/27/16  Yes Haney, Alyssa A, MD  cholecalciferol (VITAMIN D) 1000 units tablet Take 1,000 Units by mouth daily.   Yes [provider]  lisinopril (PRINIVIL,ZESTRIL) 20 MG tablet Take 1 tablet (20 mg total) by mouth daily. 01/05/18  Yes Leeanne Rio, MD  omeprazole (PRILOSEC) 20 MG capsule TAKE 1 CAPSULE BY MOUTH TWICE A DAY 01/02/18  Yes Leeanne Rio, MD  triamterene-hydrochlorothiazide (DYAZIDE) 37.5-25 MG capsule Take 1 each (1 capsule total) by mouth daily. 02/10/17  Yes Leeanne Rio, MD  venlafaxine XR (EFFEXOR-XR) 75 MG 24 hr capsule TAKE 3 CAPSULES EVERY DAY 01/05/18  Yes Leeanne Rio, MD  baclofen (LIORESAL) 10 MG tablet TAKE 1/2 TABLET 3 TIMES A DAY AS NEEDED FOR MUSCLE SPASM Patient not taking: Reported on 01/07/2018 09/01/17   Leeanne Rio, MD  levothyroxine (SYNTHROID, LEVOTHROID) 50 MCG tablet Take 1 tablet (50 mcg total) by mouth daily. Patient not taking: Reported on 01/07/2018 11/15/17   Leeanne Rio, MD  trolamine salicylate (ASPERCREME/ALOE) 10 % cream  Apply 1 application topically as needed for muscle pain. Patient not taking: Reported on 01/07/2018 03/02/17   Lind Covert, MD    Family History Family History  Problem Relation Age of Onset  . Diabetes Mother   . Hypertension Mother   . Heart disease Father        Pacemaker  . Thyroid disease Sister   . Schizophrenia Brother   . Colon cancer Neg Hx   . Esophageal cancer Neg Hx   . Rectal cancer Neg Hx   . Stomach cancer Neg Hx   . Breast cancer Neg Hx     Social History Social History   Tobacco Use  . Smoking status: Never Smoker  . Smokeless tobacco: Never Used  Substance Use Topics  . Alcohol use: No  . Drug use: No     Allergies   Patient has no known allergies.  Review of Systems Review of Systems  Constitutional: Positive for diaphoresis. Negative for chills, fatigue and fever.  HENT: Negative for congestion.   Eyes: Negative for photophobia, pain, redness and visual disturbance.  Respiratory: Positive for shortness of breath. Negative for cough, choking, chest tightness, wheezing and stridor.   Cardiovascular: Negative for chest pain and palpitations.  Gastrointestinal: Positive for nausea and vomiting.  Genitourinary: Negative for difficulty urinating, dysuria, flank pain and hematuria.  Musculoskeletal: Positive for arthralgias, joint swelling and myalgias. Negative for back pain, gait problem, neck pain and neck stiffness.  Skin: Negative for color change, pallor and rash.  Neurological: Positive for dizziness and light-headedness. Negative for tremors, seizures, syncope, facial asymmetry, speech difficulty, weakness, numbness and headaches.     Physical Exam Updated Vital Signs BP 137/70   Pulse 66   Temp 97.7 F (36.5 C) (Oral)   Resp 11   LMP  (LMP Unknown)   SpO2 98%   Physical Exam  Constitutional: She is oriented to person, place, and time. She appears well-developed and well-nourished. No distress.  Well-appearing, nontoxic  afebrile lying comfortably in bed no acute distress.  HENT:  Head: Normocephalic and atraumatic.  Right Ear: External ear normal.  Left Ear: External ear normal.  Mouth/Throat: Oropharynx is clear and moist. No oropharyngeal exudate.  Eyes: Pupils are equal, round, and reactive to light. Conjunctivae and EOM are normal. Right eye exhibits no discharge. Left eye exhibits no discharge.  Neck: Normal range of motion.  Cardiovascular: Normal rate, regular rhythm, normal heart sounds and intact distal pulses.  Pulmonary/Chest: Effort normal and breath sounds normal. No stridor. No respiratory distress. She has no wheezes. She has no rales.  Abdominal: Soft. She exhibits no distension. There is no tenderness.  Musculoskeletal: Normal range of motion. She exhibits no deformity.  Neurological: She is alert and oriented to person, place, and time. No cranial nerve deficit or sensory deficit. She exhibits normal muscle tone. Coordination normal.  Neurologic Exam:  - Mental status: Patient is alert and cooperative. Fluent speech and words are clear. Coherent thought processes and insight is good. Patient is oriented x 4 to person, place, time and event.  - Cranial nerves:  CN III, IV, VI: pupils equally round, reactive to light both direct and conscensual. Full extra-ocular movement. CN V: motor temporalis and masseter strength intact. CN VII : muscles of facial expression intact. CN X :  midline uvula. XI strength of sternocleidomastoid and trapezius muscles 5/5, XII: tongue is midline when protruded. - Motor: No involuntary movements. Muscle tone and bulk normal throughout. Muscle strength is 5/5 in bilateral shoulder abduction, elbow flexion and extension, grip, hip extension, flexion, leg flexion and extension, ankle dorsiflexion and plantar flexion.  - Sensory: Proprioception, light tough sensation intact in all extremities. - Cerebellar: rapid alternating movements and point to point movement intact in  upper and lower extremities. Normal stance and gait.  Skin: Skin is warm and dry. No rash noted. She is not diaphoretic. No erythema. No pallor.  Psychiatric: She has a normal mood and affect. Her behavior is normal.  Nursing note and vitals reviewed.    ED Treatments / Results  Labs (all labs ordered are listed, but only abnormal results are displayed) Labs Reviewed  BASIC METABOLIC PANEL - Abnormal; Notable for the following components:      Result Value   Potassium 3.3 (*)    Glucose, Bld 101 (*)    All other components within normal limits  CBC - Abnormal;  Notable for the following components:   RBC 3.81 (*)    Hemoglobin 11.4 (*)    HCT 34.1 (*)    All other components within normal limits  I-STAT TROPONIN, ED  I-STAT TROPONIN, ED    EKG EKG Interpretation  Date/Time:  Saturday Jan 07 2018 13:29:01 EDT Ventricular Rate:  69 PR Interval:  148 QRS Duration: 90 QT Interval:  434 QTC Calculation: 465 R Axis:   57 Text Interpretation:  Normal sinus rhythm Normal ECG No significant change since last tracing Confirmed by Dorie Rank [phone]) on 01/07/2018 8:29:10 PM   Radiology Dg Chest 2 View  Result Date: 01/07/2018 CLINICAL DATA:  Patient complains of sudden onset of dizziness, nausea, and vomiting at 1130. Patient states she feels a little better now after vomiting but still unwell. EXAM: CHEST - 2 VIEW COMPARISON:  12/25/2008 FINDINGS: Cardiac silhouette is normal in size and configuration. Small to moderate size hiatal hernia. Mediastinal contours otherwise unremarkable. Clear lungs. No pleural effusion or pneumothorax. Skeletal structures are intact. IMPRESSION: No active cardiopulmonary disease. Electronically Signed   By: Lajean Manes M.D.   On: 01/07/2018 14:01    Procedures Procedures (including critical care time)  Medications Ordered in ED Medications  ondansetron (ZOFRAN) injection 4 mg (has no administration in time range)  sodium chloride 0.9 % bolus  1,000 mL (0 mLs Intravenous Stopped 01/07/18 2022)  potassium chloride SA (K-DUR,KLOR-CON) CR tablet 40 mEq (40 mEq Oral Given 01/07/18 1844)     Initial Impression / Assessment and Plan / ED Course  I have reviewed the triage vital signs and the nursing notes.  Pertinent labs & imaging results that were available during my care of the patient were reviewed by me and considered in my medical decision making (see chart for details).   Patient presenting with sudden onset dizziness described as spinning, lightheadedness and feeling like she is going to "pass out".  Her symptoms are aggravated by positional changes but she denies worsening symptoms with rapid head movement.  Patient had 2 hours of diaphoresis, nausea and vomiting while at work at 1130 this morning.  States that she has since improved.  No chest pain, visual disturbances, imbalance.  Her symptoms are slightly reproducible when she stands up, feels lightheaded.  On exam she is negative for Dix-Hallpike maneuver, no nystagmus but does reports recurrence of dizziness when head moved to the left.  Negative test of skew (normal). Normal neuro exam.  Labs with mild hypokalemia  Orthostatic vital signs negative She was given IV fluids and potassium supplement.   Heart score: 2 Normal initial troponin, Delta Troponin: Negative EKG normal sinus rhythm Chest x-ray negative  On reassessment, patient reported some improvement after IV fluids but continues to experience dizziness.  States that she does not feel like the room is spinning but does feel lightheaded and dizzy at rest.  She denies any chest pain, shortness of breath, abdominal pain, nausea or vomiting.  Ordered MR brain  Patient care transferred at end of shift to Quincy Carnes, PA-C pending MR brain. Anticipate discharge home with close follow-up if normal. Treat as appropriate if any new abnormal findings.  Final Clinical Impressions(s) / ED Diagnoses   Final  diagnoses:  Dizziness  Hypokalemia    ED Discharge Orders    None       Sherry Anderson 01/07/18 2232    Dorie Rank, MD 01/09/18 [phone]

## 2018-01-08 ENCOUNTER — Emergency Department (HOSPITAL_COMMUNITY): Payer: Commercial Managed Care - PPO

## 2018-01-08 MED ORDER — ONDANSETRON 4 MG PO TBDP: 4.0000 mg | ORAL_TABLET | Freq: Three times a day (TID) | ORAL | 0 refills | Status: DC | PRN

## 2018-01-08 MED ORDER — MECLIZINE HCL 25 MG PO TABS: 25.0000 mg | ORAL_TABLET | Freq: Three times a day (TID) | ORAL | 0 refills | Status: DC | PRN

## 2018-01-08 NOTE — ED Notes (Signed)
Pt transported to MRI 

## 2018-01-08 NOTE — Discharge Instructions (Signed)
Your MRI today was normal.  No evidence of stroke. Take the prescribed medication as directed to help with dizziness. Follow-up with your primary care doctor. You can also follow-up with neurology if any ongoing issues-- call for appt. Return to the ED for new or worsening symptoms.

## 2018-01-08 NOTE — ED Provider Notes (Signed)
Assumed care from PA [place] at shift change.  See prior note for full H&P.  Briefly, 57 year old female here with dizziness.  Symptoms reproducible with standing and certain movements.  Reports she feels lightheaded.  Some improvement with IV fluids but continuing to complain of dizziness.  Labs thus far reassuring, mildly low potassium and was given oral replacement.  Plan: MRI pending.  If negative can discharge home.  Results for orders placed or performed during the hospital encounter of 24/23/53  Basic metabolic panel  Result Value Ref Range   Sodium 141 135 - 145 mmol/L   Potassium 3.3 (L) 3.5 - 5.1 mmol/L   Chloride 101 101 - 111 mmol/L   CO2 29 22 - 32 mmol/L   Glucose, Bld 101 (H) 65 - 99 mg/dL   BUN 19 6 - 20 mg/dL   Creatinine, Ser 1.00 0.44 - 1.00 mg/dL   Calcium 9.3 8.9 - 10.3 mg/dL   GFR calc non Af Amer >60 >60 mL/min   GFR calc Af Amer >60 >60 mL/min   Anion gap 11 5 - 15  CBC  Result Value Ref Range   WBC 5.0 4.0 - 10.5 K/uL   RBC 3.81 (L) 3.87 - 5.11 MIL/uL   Hemoglobin 11.4 (L) 12.0 - 15.0 g/dL   HCT 34.1 (L) 36.0 - 46.0 %   MCV 89.5 78.0 - 100.0 fL   MCH 29.9 26.0 - 34.0 pg   MCHC 33.4 30.0 - 36.0 g/dL   RDW 12.4 11.5 - 15.5 %   Platelets 267 150 - 400 K/uL  I-stat troponin, ED  Result Value Ref Range   Troponin i, poc 0.00 0.00 - 0.08 ng/mL   Comment 3          I-Stat Troponin, ED (not at [facility])  Result Value Ref Range   Troponin i, poc 0.02 0.00 - 0.08 ng/mL   Comment 3           Dg Chest 2 View  Result Date: 01/07/2018 CLINICAL DATA:  Patient complains of sudden onset of dizziness, nausea, and vomiting at 1130. Patient states she feels a little better now after vomiting but still unwell. EXAM: CHEST - 2 VIEW COMPARISON:  12/25/2008 FINDINGS: Cardiac silhouette is normal in size and configuration. Small to moderate size hiatal hernia. Mediastinal contours otherwise unremarkable. Clear lungs. No pleural effusion or pneumothorax. Skeletal structures are  intact. IMPRESSION: No active cardiopulmonary disease. Electronically Signed   By: Lajean Manes M.D.   On: 01/07/2018 14:01   Mr Brain Wo Contrast  Result Date: 01/08/2018 CLINICAL DATA:  Sudden onset dizziness. EXAM: MRI HEAD WITHOUT CONTRAST TECHNIQUE: Multiplanar, multiecho pulse sequences of the brain and surrounding structures were obtained without intravenous contrast. COMPARISON:  Head CT 11/13/2014 FINDINGS: BRAIN: The midline structures are normal. There is no acute infarct or acute hemorrhage. There is no mass lesion or other mass effect. There is no hydrocephalus, dural abnormality or extra-axial collection. The white matter signal is normal for the patient's age. No age-advanced or lobar predominant atrophy. No chronic microhemorrhage or superficial siderosis. VASCULAR: Major intracranial arterial and venous sinus flow voids are preserved. SKULL AND UPPER CERVICAL SPINE: The visualized skull base, calvarium, upper cervical spine and extracranial soft tissues are normal. SINUSES/ORBITS: No fluid levels or advanced mucosal thickening. No mastoid or middle ear effusion. The orbits are normal. IMPRESSION: Normal brain MRI. Electronically Signed   By: Ulyses Jarred M.D.   On: 01/08/2018 03:43     MRI today is  normal.  Will d/c home with sympatomtiac care for dizziness.  Refereed to neurology if any ongoing issues.  Follow-up with PCP.  Discussed plan with patient, she acknowledged understanding and agreed with plan of care.  Return precautions given for new or worsening symptoms.   Larene Pickett, PA-C 01/08/18 0540    Jola Schmidt, MD 01/08/18 [phone]

## 2018-01-10 ENCOUNTER — Telehealth: Payer: Self-pay

## 2018-01-10 MED ORDER — LISINOPRIL 20 MG PO TABS: 20.0000 mg | ORAL_TABLET | Freq: Every day | ORAL | 0 refills | Status: DC

## 2018-01-10 NOTE — Telephone Encounter (Signed)
Patient called nurse line because has not received refill on Lisinopril. Rx was sent to [facility] but patient asking for it to be sent to [facility]. Rx re-sent.  Patient also states she has not heard from her repeat thyroid test from 12/28/14 and if she is still supposed to be taking Levothyroxine.  Lastly, patient has appt with neurology on 03/15/18, ED called and made appt. She believes this is too long to wait and would like PCP to look at ED notes and advise.  Call back is [phone].  Danley Danker, RN [facility] RN)

## 2018-01-13 MED ORDER — TRIAMTERENE-HCTZ 37.5-25 MG PO CAPS: 1.0000 | ORAL_CAPSULE | Freq: Every day | ORAL | 3 refills | Status: DC

## 2018-01-13 MED ORDER — LEVOTHYROXINE SODIUM 50 MCG PO TABS: 50.0000 ug | ORAL_TABLET | Freq: Every day | ORAL | 1 refills | Status: DC

## 2018-01-13 NOTE — Telephone Encounter (Signed)
Returned call to patient.  It turns out she never started the levothyroxine because her pharmacy said they didn't receive it. I'm not sure why they said this, as it says "receipt confirmed by pharmacy". I will resend it now. She has follow up scheduled with me in 2 weeks.  Patient says she is no longer feeling dizzy after ED visit. Advised it is reasonable to wait until end of July for appointment if she is feeling better now.  Leeanne Rio, MD

## 2018-01-24 ENCOUNTER — Ambulatory Visit (INDEPENDENT_AMBULATORY_CARE_PROVIDER_SITE_OTHER): Payer: Commercial Managed Care - PPO | Admitting: Family Medicine

## 2018-01-24 VITALS — BP 110/80 | HR 94 | Temp 98.5°F | Wt 149.4 lb

## 2018-01-24 DIAGNOSIS — F418 Other specified anxiety disorders: Secondary | ICD-10-CM | POA: Diagnosis not present

## 2018-01-24 DIAGNOSIS — E038 Other specified hypothyroidism: Secondary | ICD-10-CM

## 2018-01-24 DIAGNOSIS — E039 Hypothyroidism, unspecified: Secondary | ICD-10-CM

## 2018-01-24 MED ORDER — VENLAFAXINE HCL ER 75 MG PO CP24: ORAL_CAPSULE | ORAL | 1 refills | Status: DC

## 2018-01-24 MED ORDER — LISINOPRIL 20 MG PO TABS: 20.0000 mg | ORAL_TABLET | Freq: Every day | ORAL | 1 refills | Status: DC

## 2018-01-24 MED ORDER — TRIAMTERENE-HCTZ 37.5-25 MG PO CAPS: 1.0000 | ORAL_CAPSULE | Freq: Every day | ORAL | 3 refills | Status: DC

## 2018-01-24 MED ORDER — BUPROPION HCL ER (XL) 150 MG PO TB24: 300.0000 mg | ORAL_TABLET | Freq: Every day | ORAL | 1 refills | Status: DC

## 2018-01-24 NOTE — Assessment & Plan Note (Signed)
Instructed patient to schedule lab visit for 1 month to recheck TSH to ensure that dose of Synthroid is appropriate.

## 2018-01-24 NOTE — Progress Notes (Signed)
Date of Visit: 01/24/2018   HPI:  Sherry Anderson is a 57 y.o. woman here today for follow up from E.D. visit for dizziness on 01/07/18.  Hx of Dizziness - Patient was at work and had sudden-onset lightheadedness, dizziness, nausea, and vomiting. Someone at work took her blood pressure at that time and noted that it was low. Her symptoms persisted for ~2 hours, and at that point she went to the ED. - Full cardiac workup, CBC, BMP, and brain MRI w/o contrast completed with reassuring results; mild hypokalemia of 3.3 was noted. IV fluids were administered with some improvement in symptoms, and patient was discharged with prescription for meclizine and ondansetron - Patient has had no recurrence of dizziness since discharge from ED and has not filled prescription for either meclizine or ondansetron - Denies chest pain, shortness of breath, swelling  Subclinical Hypothyroid - Taking Synthroid 50 mcg daily, began this about 1.5 weeks ago  Depression/Anxiety - Taking Effexor-XR 75 mg daily and bupropion 300 mg daily without side effects - Reports that mood has been improved notably since increasing dose of bupropion to 300 mg daily - She feels like her mood is back to normal and is not interested in increasing dose of bupropion more at this time  Of incidental note she is seeing a chiropractor for her chronic leg pain and has had an MRI for this (results not available to me).  ROS: See HPI.  PMH: HTN, HLD, hx of breast cancer in situ, hx of adenocarcinoma in situ of endocervix  PHYSICAL EXAM: BP 110/80 (BP Location: Left Arm, Patient Position: Sitting, Cuff Size: Normal)   Pulse 94   Temp 98.5 F (36.9 C) (Oral)   Wt 149 lb 6.4 oz (67.8 kg)   LMP  (LMP Unknown)   SpO2 98%   BMI 26.47 kg/m  Gen: Well appearing, sitting comfortably in chair, in no acute distress HEENT: PERRL, EOM intact Heart: RRR, no murmurs Lungs: CTAB, normal work of breathing Neuro: alert, grossly nonfocal, speech  normal Ext: atraumatic, no cyanosis or edema Psych: normal range of affect, well groomed, speech normal in rate and volume, normal eye contact   ASSESSMENT/PLAN:  Hx of dizziness Absence of symptom recurrence and negative workup in ED are reassuring for benign cause. - Encouraged patient to continue to drink plenty of water to stay well-hydrated as weather gets warmer  Subclinical hypothyroidism Instructed patient to schedule lab visit for 1 month to recheck TSH to ensure that dose of Synthroid is appropriate.  Depression with anxiety Well controlled. Continue current regimen. Follow up in 3 months.  Health maintenance  - Up to date  FOLLOW UP: Follow up in 3 months for above issues.  Sherry Anderson, [facility]  Patient seen along with MS3 student Sherry Anderson. I personally evaluated this patient along with the student, and verified all aspects of the history, physical exam, and medical decision making as documented by the student. I agree with the student's documentation and have made all necessary edits.   Sherry Netters, MD [facility]

## 2018-01-24 NOTE — Assessment & Plan Note (Signed)
Well controlled. Continue current regimen. Follow up in  3 months.  

## 2018-01-24 NOTE — Patient Instructions (Signed)
It was great to see you again today! Glad things are going so well.  Sent in refills for 3 months Follow up in 3 months, sooner if needed  Schedule lab visit in 1 month to recheck thyroid  Be well, Dr. Ardelia Mems

## 2018-02-02 ENCOUNTER — Other Ambulatory Visit: Payer: Self-pay

## 2018-02-02 MED ORDER — LEVOTHYROXINE SODIUM 50 MCG PO TABS: 50.0000 ug | ORAL_TABLET | Freq: Every day | ORAL | 0 refills | Status: DC

## 2018-02-02 NOTE — Telephone Encounter (Signed)
Patient needs three month supply of Levothyroxine sent to pharmacy[place]  Call back is [phone]  Danley Danker, RN [facility] RN)

## 2018-02-05 ENCOUNTER — Other Ambulatory Visit: Payer: Self-pay | Admitting: Family Medicine

## 2018-02-09 ENCOUNTER — Other Ambulatory Visit: Payer: Self-pay | Admitting: Family Medicine

## 2018-02-09 DIAGNOSIS — G8929 Other chronic pain: Secondary | ICD-10-CM

## 2018-02-09 DIAGNOSIS — M545 Low back pain: Principal | ICD-10-CM

## 2018-02-15 ENCOUNTER — Ambulatory Visit
Admission: RE | Admit: 2018-02-15 | Discharge: 2018-02-15 | Disposition: A | Discharge status: Home / Self Care | Payer: Commercial Managed Care - PPO | Source: Ambulatory Visit | Attending: Family Medicine | Admitting: Family Medicine

## 2018-02-15 DIAGNOSIS — M545 Low back pain: Principal | ICD-10-CM

## 2018-02-15 DIAGNOSIS — G8929 Other chronic pain: Secondary | ICD-10-CM

## 2018-02-15 MED ORDER — METHYLPREDNISOLONE ACETATE 40 MG/ML INJ SUSP (RADIOLOG
120.0000 mg | Freq: Once | INTRAMUSCULAR | Status: AC
  Administered 2018-02-15: 120 mg via EPIDURAL

## 2018-02-15 MED ORDER — IOPAMIDOL (ISOVUE-M 200) INJECTION 41%
1.0000 mL | Freq: Once | INTRAMUSCULAR | Status: AC
  Administered 2018-02-15: 1 mL via EPIDURAL

## 2018-02-15 NOTE — Discharge Instructions (Signed)

## 2018-03-14 ENCOUNTER — Other Ambulatory Visit: Payer: Self-pay | Admitting: Obstetrics & Gynecology

## 2018-03-15 ENCOUNTER — Telehealth: Payer: Self-pay | Admitting: *Deleted

## 2018-03-15 ENCOUNTER — Ambulatory Visit: Payer: Commercial Managed Care - PPO | Admitting: Neurology

## 2018-03-15 NOTE — Telephone Encounter (Signed)
No showed new patient appointment. 

## 2018-03-16 ENCOUNTER — Encounter: Payer: Self-pay | Admitting: Neurology

## 2018-03-23 ENCOUNTER — Other Ambulatory Visit: Payer: Self-pay

## 2018-04-03 ENCOUNTER — Other Ambulatory Visit: Payer: Self-pay | Admitting: Family Medicine

## 2018-04-03 ENCOUNTER — Encounter: Payer: Self-pay | Admitting: Obstetrics & Gynecology

## 2018-04-03 ENCOUNTER — Ambulatory Visit (INDEPENDENT_AMBULATORY_CARE_PROVIDER_SITE_OTHER): Payer: Commercial Managed Care - PPO | Admitting: Obstetrics & Gynecology

## 2018-04-03 VITALS — BP 126/84

## 2018-04-03 DIAGNOSIS — N898 Other specified noninflammatory disorders of vagina: Secondary | ICD-10-CM

## 2018-04-03 DIAGNOSIS — R102 Pelvic and perineal pain: Secondary | ICD-10-CM

## 2018-04-03 LAB — WET PREP FOR TRICH, YEAST, CLUE

## 2018-04-03 MED ORDER — FLUCONAZOLE 150 MG PO TABS: 150.0000 mg | ORAL_TABLET | Freq: Once | ORAL | 2 refills | Status: AC

## 2018-04-03 MED ORDER — METRONIDAZOLE 500 MG PO TABS: 500.0000 mg | ORAL_TABLET | Freq: Two times a day (BID) | ORAL | 0 refills | Status: DC

## 2018-04-03 NOTE — Progress Notes (Signed)
    Sherry Anderson 08-Sep-1960 [id number]        57 y.o.  G2P2L3 (1 set of twins)  RP: Vaginal discharge with suprapubic discomfort on-off x 1 week  HPI: Status post TLH BSO for AIS of the endocervix on September 21, 2017.  Good postop recovery without complication.  Patient complains of increased vaginal discharge with suprapubic discomfort on and off for a week.  No urinary tract infection symptoms otherwise.  Bowel movements normal.  No fever.  Patient has lower back problems for which a surgery is planned in the near future.   OB History  Gravida Para Term Preterm AB Living  2         3  SAB TAB Ectopic Multiple Live Births        1      # Outcome Date GA Lbr Len/2nd Weight Sex Delivery Anes PTL Lv  2 Gravida           1 Gravida             Past medical history,surgical history, problem list, medications, allergies, family history and social history were all reviewed and documented in the EPIC chart.   Directed ROS with pertinent positives and negatives documented in the history of present illness/assessment and plan.  Exam:  Abdomen  CVAT negative bilaterally  Gyn exam:  U/A: Yellow clear, proteins negative, nitrites negative, white blood cells 0-5, red blood cells negative, no bacteria.  Urine culture pending.  Wet prep: No yeast and no clue cells, but many white blood cells and bacteria too numerous to count.   Assessment/Plan:  57 y.o. G2P0   1. Vaginal discharge No evidence of bacterial vaginosis or yeast vaginitis, but many white blood cells and bacteria on the wet prep.  Decision to treat with Flagyl 500 mg tablet twice a day for 7 days.  Will take fluconazole at the end of the antibiotic to prevent or treat a yeast vaginitis. - WET PREP FOR TRICH, YEAST, CLUE  2. Pelvic pain Probably not an acute cystitis, but urine sent for culture.  Will wait on resolve before deciding on treatment.  Pain might be associated with lower back pain for which a back surgery is  coming up. - Urinalysis,Complete w/RFL Culture  Other orders - metroNIDAZOLE (FLAGYL) 500 MG tablet; Take 1 tablet (500 mg total) by mouth 2 (two) times daily for 7 days. - fluconazole (DIFLUCAN) 150 MG tablet; Take 1 tablet (150 mg total) by mouth once for 1 dose.  Counseling on above issues and coordination of care more than 50% for 15 minutes.  Princess Bruins MD, 3:04 PM 04/03/2018

## 2018-04-05 ENCOUNTER — Other Ambulatory Visit: Payer: Self-pay | Admitting: Family Medicine

## 2018-04-05 ENCOUNTER — Other Ambulatory Visit: Payer: Self-pay | Admitting: Orthopedic Surgery

## 2018-04-05 LAB — URINALYSIS, COMPLETE W/RFL CULTURE
Bacteria, UA: NONE SEEN /HPF
Bilirubin Urine: NEGATIVE
GLUCOSE, UA: NEGATIVE
Hgb urine dipstick: NEGATIVE
Hyaline Cast: NONE SEEN /LPF
Ketones, ur: NEGATIVE
NITRITES URINE, INITIAL: NEGATIVE
PH: 5.5 (ref 5.0–8.0)
Protein, ur: NEGATIVE
RBC / HPF: NONE SEEN /HPF (ref 0–2)
SPECIFIC GRAVITY, URINE: 1.025 (ref 1.001–1.03)

## 2018-04-05 LAB — URINE CULTURE
MICRO NUMBER:: 90990254
SPECIMEN QUALITY:: ADEQUATE

## 2018-04-05 LAB — CULTURE INDICATED

## 2018-04-09 ENCOUNTER — Encounter: Payer: Self-pay | Admitting: Obstetrics & Gynecology

## 2018-04-09 NOTE — Patient Instructions (Signed)
1. Vaginal discharge No evidence of bacterial vaginosis or yeast vaginitis, but many white blood cells and bacteria on the wet prep.  Decision to treat with Flagyl 500 mg tablet twice a day for 7 days.  Will take fluconazole at the end of the antibiotic to prevent or treat a yeast vaginitis. - WET PREP FOR TRICH, YEAST, CLUE  2. Pelvic pain Probably not an acute cystitis, but urine sent for culture.  Will wait on resolve before deciding on treatment.  Pain might be associated with lower back pain for which a back surgery is coming up. - Urinalysis,Complete w/RFL Culture  Other orders - metroNIDAZOLE (FLAGYL) 500 MG tablet; Take 1 tablet (500 mg total) by mouth 2 (two) times daily for 7 days. - fluconazole (DIFLUCAN) 150 MG tablet; Take 1 tablet (150 mg total) by mouth once for 1 dose.  Elwyn Lade, fue un placer verle hoy!  Voy a informarle de sus [employer].

## 2018-04-12 ENCOUNTER — Other Ambulatory Visit: Payer: Self-pay

## 2018-04-12 ENCOUNTER — Encounter (HOSPITAL_COMMUNITY): Payer: Self-pay | Admitting: *Deleted

## 2018-04-12 NOTE — Progress Notes (Signed)
Pt denies SOB, chest pain, and being under the care of a cardiologist. Pt denies having a cardiac cath and echo. Pt made aware to stop taking  Aspirin, vitamins, fish oil and herbal medications. Do not take any NSAIDs ie: Ibuprofen, Advil, Naproxen (Aleve), Motrin, BC and Goody Powder. Pt verbalized understanding of all pre-op instructions.

## 2018-04-13 ENCOUNTER — Inpatient Hospital Stay (HOSPITAL_COMMUNITY): Payer: Commercial Managed Care - PPO | Admitting: Certified Registered"

## 2018-04-13 ENCOUNTER — Inpatient Hospital Stay (HOSPITAL_COMMUNITY): Payer: Commercial Managed Care - PPO

## 2018-04-13 ENCOUNTER — Inpatient Hospital Stay (HOSPITAL_COMMUNITY)
Admission: RE | Admit: 2018-04-13 | Discharge: 2018-04-14 | DRG: 455 | Disposition: A | Discharge status: Home / Self Care | Payer: Commercial Managed Care - PPO | Source: Ambulatory Visit | Attending: Orthopedic Surgery | Admitting: Orthopedic Surgery

## 2018-04-13 ENCOUNTER — Inpatient Hospital Stay (HOSPITAL_COMMUNITY)
Admission: RE | Disposition: A | Discharge status: Home / Self Care | Payer: Self-pay | Source: Ambulatory Visit | Attending: Orthopedic Surgery

## 2018-04-13 DIAGNOSIS — R110 Nausea: Secondary | ICD-10-CM | POA: Diagnosis not present

## 2018-04-13 DIAGNOSIS — Z8249 Family history of ischemic heart disease and other diseases of the circulatory system: Secondary | ICD-10-CM

## 2018-04-13 DIAGNOSIS — Z923 Personal history of irradiation: Secondary | ICD-10-CM

## 2018-04-13 DIAGNOSIS — Z8349 Family history of other endocrine, nutritional and metabolic diseases: Secondary | ICD-10-CM

## 2018-04-13 DIAGNOSIS — M4807 Spinal stenosis, lumbosacral region: Secondary | ICD-10-CM | POA: Diagnosis present

## 2018-04-13 DIAGNOSIS — Z90710 Acquired absence of both cervix and uterus: Secondary | ICD-10-CM | POA: Diagnosis not present

## 2018-04-13 DIAGNOSIS — Z419 Encounter for procedure for purposes other than remedying health state, unspecified: Secondary | ICD-10-CM

## 2018-04-13 DIAGNOSIS — I10 Essential (primary) hypertension: Secondary | ICD-10-CM | POA: Diagnosis present

## 2018-04-13 DIAGNOSIS — Z90722 Acquired absence of ovaries, bilateral: Secondary | ICD-10-CM

## 2018-04-13 DIAGNOSIS — F329 Major depressive disorder, single episode, unspecified: Secondary | ICD-10-CM | POA: Diagnosis present

## 2018-04-13 DIAGNOSIS — E039 Hypothyroidism, unspecified: Secondary | ICD-10-CM | POA: Diagnosis present

## 2018-04-13 DIAGNOSIS — Z87410 Personal history of cervical dysplasia: Secondary | ICD-10-CM | POA: Diagnosis not present

## 2018-04-13 DIAGNOSIS — Z86010 Personal history of colonic polyps: Secondary | ICD-10-CM | POA: Diagnosis not present

## 2018-04-13 DIAGNOSIS — M5116 Intervertebral disc disorders with radiculopathy, lumbar region: Secondary | ICD-10-CM | POA: Diagnosis present

## 2018-04-13 DIAGNOSIS — Z79899 Other long term (current) drug therapy: Secondary | ICD-10-CM | POA: Diagnosis not present

## 2018-04-13 DIAGNOSIS — F419 Anxiety disorder, unspecified: Secondary | ICD-10-CM | POA: Diagnosis present

## 2018-04-13 DIAGNOSIS — Z9079 Acquired absence of other genital organ(s): Secondary | ICD-10-CM

## 2018-04-13 DIAGNOSIS — Z79890 Hormone replacement therapy (postmenopausal): Secondary | ICD-10-CM | POA: Diagnosis not present

## 2018-04-13 DIAGNOSIS — Z01818 Encounter for other preprocedural examination: Secondary | ICD-10-CM

## 2018-04-13 DIAGNOSIS — Z853 Personal history of malignant neoplasm of breast: Secondary | ICD-10-CM

## 2018-04-13 DIAGNOSIS — K219 Gastro-esophageal reflux disease without esophagitis: Secondary | ICD-10-CM | POA: Diagnosis present

## 2018-04-13 DIAGNOSIS — M4317 Spondylolisthesis, lumbosacral region: Secondary | ICD-10-CM | POA: Diagnosis present

## 2018-04-13 DIAGNOSIS — M4310 Spondylolisthesis, site unspecified: Secondary | ICD-10-CM

## 2018-04-13 DIAGNOSIS — M5410 Radiculopathy, site unspecified: Secondary | ICD-10-CM | POA: Diagnosis present

## 2018-04-13 DIAGNOSIS — M5416 Radiculopathy, lumbar region: Secondary | ICD-10-CM

## 2018-04-13 HISTORY — DX: Inflammatory liver disease, unspecified: K75.9

## 2018-04-13 HISTORY — PX: TRANSFORAMINAL LUMBAR INTERBODY FUSION (TLIF) WITH PEDICLE SCREW FIXATION 2 LEVEL: SHX6142

## 2018-04-13 HISTORY — DX: Spinal stenosis, site unspecified: M48.00

## 2018-04-13 HISTORY — DX: Spondylolisthesis, site unspecified: M43.10

## 2018-04-13 HISTORY — DX: Hypothyroidism, unspecified: E03.9

## 2018-04-13 LAB — CBC WITH DIFFERENTIAL/PLATELET
Abs Immature Granulocytes: 0 10*3/uL (ref 0.0–0.1)
Basophils Absolute: 0 10*3/uL (ref 0.0–0.1)
Basophils Relative: 1 %
EOS ABS: 0.2 10*3/uL (ref 0.0–0.7)
Eosinophils Relative: 5 %
HCT: 38.6 % (ref 36.0–46.0)
Hemoglobin: 12.5 g/dL (ref 12.0–15.0)
Immature Granulocytes: 0 %
Lymphocytes Relative: 47 %
Lymphs Abs: 2 10*3/uL (ref 0.7–4.0)
MCH: 30.2 pg (ref 26.0–34.0)
MCHC: 32.4 g/dL (ref 30.0–36.0)
MCV: 93.2 fL (ref 78.0–100.0)
MONO ABS: 0.4 10*3/uL (ref 0.1–1.0)
Monocytes Relative: 9 %
Neutro Abs: 1.6 10*3/uL — ABNORMAL LOW (ref 1.7–7.7)
Neutrophils Relative %: 38 %
Platelets: 288 10*3/uL (ref 150–400)
RBC: 4.14 MIL/uL (ref 3.87–5.11)
RDW: 12.8 % (ref 11.5–15.5)
WBC: 4.2 10*3/uL (ref 4.0–10.5)

## 2018-04-13 LAB — SURGICAL PCR SCREEN
MRSA, PCR: NEGATIVE
STAPHYLOCOCCUS AUREUS: NEGATIVE

## 2018-04-13 LAB — ABO/RH: ABO/RH(D): A POS

## 2018-04-13 LAB — TYPE AND SCREEN
ABO/RH(D): A POS
ANTIBODY SCREEN: NEGATIVE

## 2018-04-13 LAB — COMPREHENSIVE METABOLIC PANEL
ALT: 19 U/L (ref 0–44)
ANION GAP: 10 (ref 5–15)
AST: 22 U/L (ref 15–41)
Albumin: 3.6 g/dL (ref 3.5–5.0)
Alkaline Phosphatase: 43 U/L (ref 38–126)
BILIRUBIN TOTAL: 0.7 mg/dL (ref 0.3–1.2)
BUN: 14 mg/dL (ref 6–20)
CALCIUM: 8.9 mg/dL (ref 8.9–10.3)
CO2: 25 mmol/L (ref 22–32)
Chloride: 106 mmol/L (ref 98–111)
Creatinine, Ser: 0.92 mg/dL (ref 0.44–1.00)
GFR calc non Af Amer: >60 mL/min (ref >60)
GLUCOSE: 94 mg/dL (ref 70–99)
POTASSIUM: 3.6 mmol/L (ref 3.5–5.1)
SODIUM: 141 mmol/L (ref 135–145)
Total Protein: 6.7 g/dL (ref 6.5–8.1)

## 2018-04-13 LAB — PROTIME-INR
INR: 1.03
Prothrombin Time: 13.4 seconds (ref 11.4–15.2)

## 2018-04-13 LAB — APTT: APTT: 28 s (ref 24–36)

## 2018-04-13 SURGERY — TRANSFORAMINAL LUMBAR INTERBODY FUSION (TLIF) WITH PEDICLE SCREW FIXATION 2 LEVEL
Anesthesia: General | Site: Back

## 2018-04-13 MED ORDER — PROPOFOL 10 MG/ML IV BOLUS
INTRAVENOUS | Status: DC | PRN
  Administered 2018-04-13: 140 mg via INTRAVENOUS

## 2018-04-13 MED ORDER — ROCURONIUM BROMIDE 50 MG/5ML IV SOSY
PREFILLED_SYRINGE | INTRAVENOUS | Status: AC
  Filled 2018-04-13: qty 20

## 2018-04-13 MED ORDER — CEFAZOLIN SODIUM-DEXTROSE 2-4 GM/100ML-% IV SOLN
2.0000 g | INTRAVENOUS | Status: AC
  Administered 2018-04-13 (×2): 2 g via INTRAVENOUS
  Filled 2018-04-13: qty 100

## 2018-04-13 MED ORDER — HYDROMORPHONE HCL 1 MG/ML IJ SOLN
0.2500 mg | INTRAMUSCULAR | Status: DC | PRN
  Administered 2018-04-13 (×2): 0.5 mg via INTRAVENOUS

## 2018-04-13 MED ORDER — SUGAMMADEX SODIUM 200 MG/2ML IV SOLN
INTRAVENOUS | Status: DC | PRN
  Administered 2018-04-13: 140 mg via INTRAVENOUS

## 2018-04-13 MED ORDER — SODIUM CHLORIDE 0.9% FLUSH
3.0000 mL | Freq: Two times a day (BID) | INTRAVENOUS | Status: DC
  Administered 2018-04-13: 3 mL via INTRAVENOUS

## 2018-04-13 MED ORDER — METHYLENE BLUE 0.5 % INJ SOLN
INTRAVENOUS | Status: DC | PRN
  Administered 2018-04-13: 1 mL

## 2018-04-13 MED ORDER — LACTATED RINGERS IV SOLN
INTRAVENOUS | Status: DC
  Administered 2018-04-13 (×3): via INTRAVENOUS

## 2018-04-13 MED ORDER — LIDOCAINE 2% (20 MG/ML) 5 ML SYRINGE
INTRAMUSCULAR | Status: DC | PRN
  Administered 2018-04-13: 60 mg via INTRAVENOUS

## 2018-04-13 MED ORDER — ONDANSETRON HCL 4 MG/2ML IJ SOLN
INTRAMUSCULAR | Status: AC
  Filled 2018-04-13: qty 2

## 2018-04-13 MED ORDER — ROCURONIUM BROMIDE 50 MG/5ML IV SOSY
PREFILLED_SYRINGE | INTRAVENOUS | Status: DC | PRN
  Administered 2018-04-13 (×3): 50 mg via INTRAVENOUS

## 2018-04-13 MED ORDER — SODIUM CHLORIDE 0.9 % IV SOLN: 250.0000 mL | INTRAVENOUS | Status: DC

## 2018-04-13 MED ORDER — ZOLPIDEM TARTRATE 5 MG PO TABS: 5.0000 mg | ORAL_TABLET | Freq: Every evening | ORAL | Status: DC | PRN

## 2018-04-13 MED ORDER — BUPROPION HCL ER (XL) 300 MG PO TB24
300.0000 mg | ORAL_TABLET | Freq: Every day | ORAL | Status: DC
  Administered 2018-04-13: 300 mg via ORAL
  Filled 2018-04-13: qty 1

## 2018-04-13 MED ORDER — HYDROMORPHONE HCL 1 MG/ML IJ SOLN
INTRAMUSCULAR | Status: AC
  Filled 2018-04-13: qty 1

## 2018-04-13 MED ORDER — SODIUM CHLORIDE 0.9 % IV SOLN
INTRAVENOUS | Status: DC | PRN
  Administered 2018-04-13: 50 ug/min via INTRAVENOUS

## 2018-04-13 MED ORDER — MIDAZOLAM HCL 5 MG/5ML IJ SOLN
INTRAMUSCULAR | Status: DC | PRN
  Administered 2018-04-13: 2 mg via INTRAVENOUS

## 2018-04-13 MED ORDER — CEFAZOLIN SODIUM 1 G IJ SOLR
INTRAMUSCULAR | Status: AC
  Filled 2018-04-13: qty 40

## 2018-04-13 MED ORDER — THROMBIN (RECOMBINANT) 20000 UNITS EX SOLR
CUTANEOUS | Status: AC
  Filled 2018-04-13: qty 20000

## 2018-04-13 MED ORDER — DOCUSATE SODIUM 100 MG PO CAPS
100.0000 mg | ORAL_CAPSULE | Freq: Two times a day (BID) | ORAL | Status: DC
  Administered 2018-04-13: 100 mg via ORAL
  Filled 2018-04-13: qty 1

## 2018-04-13 MED ORDER — THROMBIN 20000 UNITS EX SOLR
CUTANEOUS | Status: DC | PRN
  Administered 2018-04-13: 20000 [IU] via TOPICAL

## 2018-04-13 MED ORDER — DIAZEPAM 5 MG PO TABS
5.0000 mg | ORAL_TABLET | Freq: Four times a day (QID) | ORAL | Status: DC | PRN
  Administered 2018-04-14: 5 mg via ORAL
  Filled 2018-04-13: qty 1

## 2018-04-13 MED ORDER — POVIDONE-IODINE 7.5 % EX SOLN
Freq: Once | CUTANEOUS | Status: DC
  Filled 2018-04-13: qty 118

## 2018-04-13 MED ORDER — MORPHINE SULFATE (PF) 2 MG/ML IV SOLN: 1.0000 mg | INTRAVENOUS | Status: DC | PRN

## 2018-04-13 MED ORDER — LIDOCAINE 2% (20 MG/ML) 5 ML SYRINGE
INTRAMUSCULAR | Status: AC
  Filled 2018-04-13: qty 5

## 2018-04-13 MED ORDER — PROMETHAZINE HCL 25 MG/ML IJ SOLN: 6.2500 mg | INTRAMUSCULAR | Status: DC | PRN

## 2018-04-13 MED ORDER — ONDANSETRON HCL 4 MG/2ML IJ SOLN: INTRAMUSCULAR | Status: DC | PRN

## 2018-04-13 MED ORDER — ONDANSETRON HCL 4 MG PO TABS: 4.0000 mg | ORAL_TABLET | Freq: Four times a day (QID) | ORAL | Status: DC | PRN

## 2018-04-13 MED ORDER — PROPOFOL 500 MG/50ML IV EMUL
INTRAVENOUS | Status: DC | PRN
  Administered 2018-04-13: 50 ug/kg/min via INTRAVENOUS

## 2018-04-13 MED ORDER — FLEET ENEMA 7-19 GM/118ML RE ENEM: 1.0000 | ENEMA | Freq: Once | RECTAL | Status: DC | PRN

## 2018-04-13 MED ORDER — OXYCODONE-ACETAMINOPHEN 5-325 MG PO TABS
1.0000 | ORAL_TABLET | ORAL | Status: DC | PRN
  Administered 2018-04-13: 1 via ORAL
  Administered 2018-04-14: 2 via ORAL
  Filled 2018-04-13: qty 2
  Filled 2018-04-13: qty 1

## 2018-04-13 MED ORDER — LEVOTHYROXINE SODIUM 100 MCG PO TABS
50.0000 ug | ORAL_TABLET | Freq: Every day | ORAL | Status: DC
  Administered 2018-04-13: 50 ug via ORAL
  Filled 2018-04-13 (×2): qty 1

## 2018-04-13 MED ORDER — PROPOFOL 10 MG/ML IV BOLUS
INTRAVENOUS | Status: AC
  Filled 2018-04-13: qty 20

## 2018-04-13 MED ORDER — BUPIVACAINE LIPOSOME 1.3 % IJ SUSP
20.0000 mL | Freq: Once | INTRAMUSCULAR | Status: DC
  Filled 2018-04-13: qty 20

## 2018-04-13 MED ORDER — BUPIVACAINE LIPOSOME 1.3 % IJ SUSP
INTRAMUSCULAR | Status: DC | PRN
  Administered 2018-04-13: 20 mL

## 2018-04-13 MED ORDER — METHYLENE BLUE 0.5 % INJ SOLN
INTRAVENOUS | Status: AC
  Filled 2018-04-13: qty 10

## 2018-04-13 MED ORDER — MENTHOL 3 MG MT LOZG: 1.0000 | LOZENGE | OROMUCOSAL | Status: DC | PRN

## 2018-04-13 MED ORDER — FENTANYL CITRATE (PF) 250 MCG/5ML IJ SOLN
INTRAMUSCULAR | Status: AC
  Filled 2018-04-13: qty 5

## 2018-04-13 MED ORDER — MIDAZOLAM HCL 2 MG/2ML IJ SOLN
INTRAMUSCULAR | Status: AC
  Filled 2018-04-13: qty 2

## 2018-04-13 MED ORDER — CEFAZOLIN SODIUM-DEXTROSE 2-4 GM/100ML-% IV SOLN
2.0000 g | Freq: Three times a day (TID) | INTRAVENOUS | Status: DC
  Administered 2018-04-14: 2 g via INTRAVENOUS
  Filled 2018-04-13: qty 100

## 2018-04-13 MED ORDER — ONDANSETRON HCL 4 MG/2ML IJ SOLN
INTRAMUSCULAR | Status: DC | PRN
  Administered 2018-04-13: 4 mg via INTRAVENOUS

## 2018-04-13 MED ORDER — 0.9 % SODIUM CHLORIDE (POUR BTL) OPTIME
TOPICAL | Status: DC | PRN
  Administered 2018-04-13 (×2): 1000 mL

## 2018-04-13 MED ORDER — ACETAMINOPHEN 325 MG PO TABS: 650.0000 mg | ORAL_TABLET | ORAL | Status: DC | PRN

## 2018-04-13 MED ORDER — LISINOPRIL 20 MG PO TABS
20.0000 mg | ORAL_TABLET | Freq: Every day | ORAL | Status: DC
  Administered 2018-04-13: 20 mg via ORAL
  Filled 2018-04-13: qty 1

## 2018-04-13 MED ORDER — TRIAMTERENE-HCTZ 37.5-25 MG PO CAPS
1.0000 | ORAL_CAPSULE | Freq: Every day | ORAL | Status: DC
  Filled 2018-04-13: qty 1

## 2018-04-13 MED ORDER — SODIUM CHLORIDE 0.9% FLUSH: 3.0000 mL | INTRAVENOUS | Status: DC | PRN

## 2018-04-13 MED ORDER — VENLAFAXINE HCL ER 75 MG PO CP24
225.0000 mg | ORAL_CAPSULE | Freq: Every day | ORAL | Status: DC
  Administered 2018-04-14: 225 mg via ORAL
  Filled 2018-04-13: qty 3

## 2018-04-13 MED ORDER — PHENYLEPHRINE 40 MCG/ML (10ML) SYRINGE FOR IV PUSH (FOR BLOOD PRESSURE SUPPORT)
PREFILLED_SYRINGE | INTRAVENOUS | Status: AC
  Filled 2018-04-13: qty 10

## 2018-04-13 MED ORDER — PHENOL 1.4 % MT LIQD: 1.0000 | OROMUCOSAL | Status: DC | PRN

## 2018-04-13 MED ORDER — ALBUMIN HUMAN 5 % IV SOLN
INTRAVENOUS | Status: DC | PRN
  Administered 2018-04-13: 18:00:00 via INTRAVENOUS

## 2018-04-13 MED ORDER — BUPIVACAINE-EPINEPHRINE (PF) 0.25% -1:200000 IJ SOLN
INTRAMUSCULAR | Status: AC
  Filled 2018-04-13: qty 30

## 2018-04-13 MED ORDER — FENTANYL CITRATE (PF) 100 MCG/2ML IJ SOLN
INTRAMUSCULAR | Status: DC | PRN
  Administered 2018-04-13 (×5): 50 ug via INTRAVENOUS
  Administered 2018-04-13 (×2): 100 ug via INTRAVENOUS

## 2018-04-13 MED ORDER — POTASSIUM CHLORIDE IN NACL 20-0.9 MEQ/L-% IV SOLN: INTRAVENOUS | Status: DC

## 2018-04-13 MED ORDER — PHENYLEPHRINE HCL 10 MG/ML IJ SOLN
INTRAMUSCULAR | Status: DC | PRN
  Administered 2018-04-13 (×2): 200 ug via INTRAVENOUS

## 2018-04-13 MED ORDER — PANTOPRAZOLE SODIUM 40 MG PO TBEC
40.0000 mg | DELAYED_RELEASE_TABLET | Freq: Every day | ORAL | Status: DC
  Administered 2018-04-13: 40 mg via ORAL
  Filled 2018-04-13: qty 1

## 2018-04-13 MED ORDER — BUPIVACAINE-EPINEPHRINE 0.25% -1:200000 IJ SOLN
INTRAMUSCULAR | Status: DC | PRN
  Administered 2018-04-13: 30 mL

## 2018-04-13 MED ORDER — ROCURONIUM BROMIDE 50 MG/5ML IV SOSY
PREFILLED_SYRINGE | INTRAVENOUS | Status: AC
  Filled 2018-04-13: qty 5

## 2018-04-13 MED ORDER — ALUM & MAG HYDROXIDE-SIMETH 200-200-20 MG/5ML PO SUSP: 30.0000 mL | Freq: Four times a day (QID) | ORAL | Status: DC | PRN

## 2018-04-13 MED ORDER — SENNOSIDES-DOCUSATE SODIUM 8.6-50 MG PO TABS: 1.0000 | ORAL_TABLET | Freq: Every evening | ORAL | Status: DC | PRN

## 2018-04-13 MED ORDER — BISACODYL 5 MG PO TBEC: 5.0000 mg | DELAYED_RELEASE_TABLET | Freq: Every day | ORAL | Status: DC | PRN

## 2018-04-13 MED ORDER — ONDANSETRON HCL 4 MG/2ML IJ SOLN: 4.0000 mg | Freq: Four times a day (QID) | INTRAMUSCULAR | Status: DC | PRN

## 2018-04-13 MED ORDER — ACETAMINOPHEN 650 MG RE SUPP: 650.0000 mg | RECTAL | Status: DC | PRN

## 2018-04-13 MED ORDER — CALCIUM CARBONATE-VITAMIN D 500-200 MG-UNIT PO TABS
1.0000 | ORAL_TABLET | Freq: Two times a day (BID) | ORAL | Status: DC
  Administered 2018-04-13: 1 via ORAL
  Filled 2018-04-13: qty 1

## 2018-04-13 MED ORDER — HEMOSTATIC AGENTS (NO CHARGE) OPTIME
TOPICAL | Status: DC | PRN
  Administered 2018-04-13: 1 via TOPICAL

## 2018-04-13 SURGICAL SUPPLY — 91 items
APL SKNCLS STERI-STRIP NONHPOA (GAUZE/BANDAGES/DRESSINGS) ×1
BENZOIN TINCTURE PRP APPL 2/3 (GAUZE/BANDAGES/DRESSINGS) ×2 IMPLANT
BIT DRILL NEURO 2X3.1 SFT TUCH (MISCELLANEOUS) IMPLANT
BLADE CLIPPER SURG (BLADE) IMPLANT
BUR PRESCISION 1.7 ELITE (BURR) ×2 IMPLANT
BUR ROUND FLUTED 5 RND (BURR) ×2 IMPLANT
BUR ROUND PRECISION 4.0 (BURR) IMPLANT
BUR SABER RD CUTTING 3.0 (BURR) IMPLANT
CAGE CONCORDE BULLET 9X11X23 (Cage) ×2 IMPLANT
CAGE CONCORDE BULLET 9X12X23 (Cage) ×1 IMPLANT
CAGE SPNL 5D BLT NOSE 23X9X11 (Cage) IMPLANT
CARTRIDGE OIL MAESTRO DRILL (MISCELLANEOUS) ×1 IMPLANT
CONT SPEC 4OZ CLIKSEAL STRL BL (MISCELLANEOUS) ×2 IMPLANT
COVER MAYO STAND STRL (DRAPES) ×4 IMPLANT
COVER SURGICAL LIGHT HANDLE (MISCELLANEOUS) ×2 IMPLANT
DIFFUSER DRILL AIR PNEUMATIC (MISCELLANEOUS) ×2 IMPLANT
DRAIN CHANNEL 15F RND FF W/TCR (WOUND CARE) IMPLANT
DRAPE C-ARM 42X72 X-RAY (DRAPES) ×2 IMPLANT
DRAPE C-ARMOR (DRAPES) IMPLANT
DRAPE POUCH INSTRU U-SHP 10X18 (DRAPES) ×2 IMPLANT
DRAPE SURG 17X23 STRL (DRAPES) ×8 IMPLANT
DRILL NEURO 2X3.1 SOFT TOUCH (MISCELLANEOUS) ×2
DURAPREP 26ML APPLICATOR (WOUND CARE) ×2 IMPLANT
ELECT BLADE 4.0 EZ CLEAN MEGAD (MISCELLANEOUS) ×2
ELECT CAUTERY BLADE 6.4 (BLADE) ×2 IMPLANT
ELECT REM PT RETURN 9FT ADLT (ELECTROSURGICAL) ×2
ELECTRODE BLDE 4.0 EZ CLN MEGD (MISCELLANEOUS) ×1 IMPLANT
ELECTRODE REM PT RTRN 9FT ADLT (ELECTROSURGICAL) ×1 IMPLANT
EVACUATOR SILICONE 100CC (DRAIN) IMPLANT
FEE INTRAOP MONITOR IMPULS NCS (MISCELLANEOUS) IMPLANT
FILTER STRAW FLUID ASPIR (MISCELLANEOUS) ×2 IMPLANT
GAUZE 4X4 16PLY RFD (DISPOSABLE) ×2 IMPLANT
GAUZE SPONGE 4X4 12PLY STRL (GAUZE/BANDAGES/DRESSINGS) ×2 IMPLANT
GAUZE SPONGE 4X4 12PLY STRL LF (GAUZE/BANDAGES/DRESSINGS) ×1 IMPLANT
GLOVE BIO SURGEON STRL SZ7 (GLOVE) ×2 IMPLANT
GLOVE BIO SURGEON STRL SZ8 (GLOVE) ×2 IMPLANT
GLOVE BIOGEL PI IND STRL 7.0 (GLOVE) ×1 IMPLANT
GLOVE BIOGEL PI IND STRL 8 (GLOVE) ×1 IMPLANT
GLOVE BIOGEL PI INDICATOR 7.0 (GLOVE) ×1
GLOVE BIOGEL PI INDICATOR 8 (GLOVE) ×1
GOWN STRL REUS W/ TWL LRG LVL3 (GOWN DISPOSABLE) ×2 IMPLANT
GOWN STRL REUS W/ TWL XL LVL3 (GOWN DISPOSABLE) ×1 IMPLANT
GOWN STRL REUS W/TWL LRG LVL3 (GOWN DISPOSABLE) ×4
GOWN STRL REUS W/TWL XL LVL3 (GOWN DISPOSABLE) ×2
INTRAOP MONITOR FEE IMPULS NCS (MISCELLANEOUS) ×1
INTRAOP MONITOR FEE IMPULSE (MISCELLANEOUS) ×1
IV CATH 14GX2 1/4 (CATHETERS) ×2 IMPLANT
KIT BASIN OR (CUSTOM PROCEDURE TRAY) ×2 IMPLANT
KIT POSITION SURG JACKSON T1 (MISCELLANEOUS) ×2 IMPLANT
KIT TURNOVER KIT B (KITS) ×2 IMPLANT
MARKER SKIN DUAL TIP RULER LAB (MISCELLANEOUS) ×4 IMPLANT
MIX DBX 10CC 35% BONE (Bone Implant) ×2 IMPLANT
NDL HYPO 25GX1X1/2 BEV (NEEDLE) ×1 IMPLANT
NDL SAFETY ECLIPSE 18X1.5 (NEEDLE) ×1 IMPLANT
NDL SPNL 18GX3.5 QUINCKE PK (NEEDLE) ×2 IMPLANT
NEEDLE 22X1 1/2 (OR ONLY) (NEEDLE) ×4 IMPLANT
NEEDLE HYPO 18GX1.5 SHARP (NEEDLE) ×2
NEEDLE HYPO 25GX1X1/2 BEV (NEEDLE) ×2 IMPLANT
NEEDLE SPNL 18GX3.5 QUINCKE PK (NEEDLE) ×4 IMPLANT
NS IRRIG 1000ML POUR BTL (IV SOLUTION) ×2 IMPLANT
OIL CARTRIDGE MAESTRO DRILL (MISCELLANEOUS) ×2
PACK LAMINECTOMY ORTHO (CUSTOM PROCEDURE TRAY) ×2 IMPLANT
PACK UNIVERSAL I (CUSTOM PROCEDURE TRAY) ×2 IMPLANT
PAD ARMBOARD 7.5X6 YLW CONV (MISCELLANEOUS) ×4 IMPLANT
PATTIES SURGICAL .5 X1 (DISPOSABLE) ×2 IMPLANT
PATTIES SURGICAL .5X1.5 (GAUZE/BANDAGES/DRESSINGS) ×2 IMPLANT
PROBE PEDCLE PROBE MAGSTM DISP (MISCELLANEOUS) ×1 IMPLANT
ROD EXPEDIUM PER BENT 65MM (Rod) ×2 IMPLANT
SCREW CORTICAL VIPER 7X35 (Screw) ×1 IMPLANT
SCREW CORTICAL VIPER 7X40MM (Screw) ×2 IMPLANT
SCREW PED FA VIPER 8X35 (Screw) ×1 IMPLANT
SCREW SET SINGLE INNER (Screw) ×6 IMPLANT
SCREW VIPER CORT FIX 6X35 (Screw) ×4 IMPLANT
SPONGE INTESTINAL PEANUT (DISPOSABLE) ×2 IMPLANT
SPONGE SURGIFOAM ABS GEL 100 (HEMOSTASIS) ×2 IMPLANT
STRIP CLOSURE SKIN 1/2X4 (GAUZE/BANDAGES/DRESSINGS) ×3 IMPLANT
SURGIFLO W/THROMBIN 8M KIT (HEMOSTASIS) IMPLANT
SUT MNCRL AB 4-0 PS2 18 (SUTURE) ×2 IMPLANT
SUT VIC AB 0 CT1 18XCR BRD 8 (SUTURE) ×1 IMPLANT
SUT VIC AB 0 CT1 8-18 (SUTURE) ×2
SUT VIC AB 1 CT1 18XCR BRD 8 (SUTURE) ×1 IMPLANT
SUT VIC AB 1 CT1 8-18 (SUTURE) ×4
SUT VIC AB 2-0 CT2 18 VCP726D (SUTURE) ×2 IMPLANT
SYR 20CC LL (SYRINGE) ×4 IMPLANT
SYR BULB IRRIGATION 50ML (SYRINGE) ×2 IMPLANT
SYR CONTROL 10ML LL (SYRINGE) ×4 IMPLANT
SYR TB 1ML LUER SLIP (SYRINGE) ×2 IMPLANT
TAPE CLOTH SURG 4X10 WHT LF (GAUZE/BANDAGES/DRESSINGS) ×1 IMPLANT
TRAY FOLEY MTR SLVR 16FR STAT (SET/KITS/TRAYS/PACK) ×2 IMPLANT
WATER STERILE IRR 1000ML POUR (IV SOLUTION) ×2 IMPLANT
YANKAUER SUCT BULB TIP NO VENT (SUCTIONS) ×2 IMPLANT

## 2018-04-13 NOTE — Anesthesia Preprocedure Evaluation (Addendum)
Anesthesia Evaluation  Patient identified by MRN, date of birth, ID band Patient awake    Reviewed: Allergy & Precautions, NPO status , Patient's Chart, lab work & pertinent test results  Airway Mallampati: II  TM Distance: >3 FB Neck ROM: Full    Dental  (+) Dental Advisory Given   Pulmonary neg pulmonary ROS,    breath sounds clear to auscultation       Cardiovascular hypertension, Pt. on medications  Rhythm:Regular Rate:Normal     Neuro/Psych Anxiety Depression negative neurological ROS     GI/Hepatic Neg liver ROS, GERD  Medicated,  Endo/Other  Hypothyroidism   Renal/GU negative Renal ROS     Musculoskeletal  (+) Arthritis ,   Abdominal   Peds  Hematology negative hematology ROS (+)   Anesthesia Other Findings   Reproductive/Obstetrics                             Lab Results  Component Value Date   WBC 4.2 04/13/2018   HGB 12.5 04/13/2018   HCT 38.6 04/13/2018   MCV 93.2 04/13/2018   PLT 288 04/13/2018   Lab Results  Component Value Date   CREATININE 0.92 04/13/2018   BUN 14 04/13/2018   NA 141 04/13/2018   K 3.6 04/13/2018   CL 106 04/13/2018   CO2 25 04/13/2018    Anesthesia Physical Anesthesia Plan  ASA: II  Anesthesia Plan: General   Post-op Pain Management:    Induction: Intravenous  PONV Risk Score and Plan: 3 and Ondansetron, Midazolam, Treatment may vary due to age or medical condition and Propofol infusion  Airway Management Planned: Oral ETT  Additional Equipment:   Intra-op Plan:   Post-operative Plan: Extubation in OR  Informed Consent: I have reviewed the patients History and Physical, chart, labs and discussed the procedure including the risks, benefits and alternatives for the proposed anesthesia with the patient or authorized representative who has indicated his/her understanding and acceptance.   Dental advisory given  Plan Discussed  with: CRNA  Anesthesia Plan Comments:        Anesthesia Quick Evaluation

## 2018-04-13 NOTE — H&P (Signed)
PREOPERATIVE H&P  Chief Complaint: Right leg pain  HPI: Sherry Anderson is a 57 y.o. female who presents with ongoing pain in the right leg  MRI reveals stenosis and instability at L4/5 and L5/S1  Patient has failed multiple forms of conservative care and continues to have pain (see office notes for additional details regarding the patient's full course of treatment)  Past Medical History:  Diagnosis Date  . Adenocarcinoma in situ (AIS) of uterine cervix 05/2017   Associated with high-grade dysplasia  . Anxiety and depression   . Cancer [facility]) 2002   left breast cancer   . Depression   . Diverticulosis of colon   . Essential hypertension, benign   . GERD (gastroesophageal reflux disease)   . Hepatitis    unsure of which type  . High grade squamous intraepithelial cervical dysplasia 05/2017   Associated with adenocarcinoma in situ  . History of adenomatous polyp of colon    tubular adenoma's  02-08-2017  . History of gastritis   . History of left breast cancer 2002---- per pt no recurrence   dx DCIS --- s/p  left breast lumpectomy;  per pt radiation therapy completed same year and completed antiestrogen therapy   . History of radiation therapy    2002   left breast  . Hypothyroidism   . Spinal stenosis   . Spondylolisthesis   . SVD (spontaneous vaginal delivery)    x 1   Past Surgical History:  Procedure Laterality Date  . BREAST LUMPECTOMY Left 2002   ductal CA in situ  . CERVICAL CONIZATION W/BX N/A 06/08/2017   Procedure: CONIZATION CERVIX WITH BIOPSY;  Surgeon: Anastasio Auerbach, MD;  Location: [place];  Service: Gynecology;  Laterality: N/A;  . [place]   twins  . COLONOSCOPY  02-08-2017   dr Karleen Hampshire  . DILATATION & CURETTAGE/HYSTEROSCOPY WITH MYOSURE N/A 06/08/2017   Procedure: [place];  Surgeon: Anastasio Auerbach, MD;  Location: [place];  Service:  Gynecology;  Laterality: N/A;  . ESOPHAGOGASTRODUODENOSCOPY  last one 11-24-2016   dr Karleen Hampshire  . ROBOTIC ASSISTED TOTAL HYSTERECTOMY WITH BILATERAL SALPINGO OOPHERECTOMY N/A 09/21/2017   Procedure: XI ROBOTIC ASSISTED TOTAL HYSTERECTOMY WITH BILATERAL SALPINGO OOPHORECTOMY, Lysis of Adhesions;  Surgeon: Princess Bruins, MD;  Location: [facility];  Service: Gynecology;  Laterality: N/A;  . TOOTH EXTRACTION    . WISDOM TOOTH EXTRACTION     Social History   Socioeconomic History  . Marital status: Legally Separated    Spouse name: Not on file  . Number of children: 3  . Years of education: 6  . Highest education level: Not on file  Occupational History    Employer: [employer]  Social Needs  . Financial resource strain: Not on file  . Food insecurity:    Worry: Not on file    Inability: Not on file  . Transportation needs:    Medical: Not on file    Non-medical: Not on file  Tobacco Use  . Smoking status: Never Smoker  . Smokeless tobacco: Never Used  Substance and Sexual Activity  . Alcohol use: No  . Drug use: No  . Sexual activity: Not Currently    Birth control/protection: Post-menopausal    Comment: 1st intercourse 57 yo-Fewer than 5 partners  Lifestyle  . Physical activity:    Days per week: Not on file    Minutes per session: Not on file  .  Stress: Not on file  Relationships  . Social connections:    Talks on phone: Not on file    Gets together: Not on file    Attends religious service: Not on file    Active member of club or organization: Not on file    Attends meetings of clubs or organizations: Not on file    Relationship status: Not on file  Other Topics Concern  . Not on file  Social History Narrative   Moved from [place] to [place], to [place]   3 sons, Dellis Filbert in [place], another in [place], [place] at [place]   Parents live in [place] with her   Family History  Problem Relation Age of Onset  . Diabetes Mother   . Hypertension Mother   .  Heart disease Father        Pacemaker  . Thyroid disease Sister   . Schizophrenia Brother   . Colon cancer Neg Hx   . Esophageal cancer Neg Hx   . Rectal cancer Neg Hx   . Stomach cancer Neg Hx   . Breast cancer Neg Hx    No Known Allergies Prior to Admission medications   Medication Sig Start Date End Date Taking? Authorizing Provider  buPROPion (WELLBUTRIN XL) 150 MG 24 hr tablet Take 2 tablets (300 mg total) by mouth daily. 01/24/18  Yes Leeanne Rio, MD  Calcium Citrate-Vitamin D (CALCIUM + D PO) Take 1 tablet by mouth 2 (two) times daily.   Yes [provider]  ibuprofen (ADVIL,MOTRIN) 200 MG tablet Take 200 mg by mouth every 6 (six) hours as needed for mild pain.    Yes [provider]  levothyroxine (SYNTHROID, LEVOTHROID) 50 MCG tablet Take 1 tablet (50 mcg total) by mouth daily. 02/02/18  Yes Chambliss, Jeb Levering, MD  lisinopril (PRINIVIL,ZESTRIL) 20 MG tablet Take 1 tablet (20 mg total) by mouth daily. 01/24/18  Yes Leeanne Rio, MD  omeprazole (PRILOSEC) 20 MG capsule TAKE 1 CAPSULE BY MOUTH TWICE A DAY 04/04/18  Yes Alveda Reasons, MD  oxyCODONE-acetaminophen (PERCOCET/ROXICET) 5-325 MG tablet Take 1 tablet by mouth every 8 (eight) hours as needed for severe pain.    Yes [provider]  triamterene-hydrochlorothiazide (DYAZIDE) 37.5-25 MG capsule Take 1 each (1 capsule total) by mouth daily. 01/24/18  Yes Leeanne Rio, MD  venlafaxine XR (EFFEXOR-XR) 75 MG 24 hr capsule TAKE 3 CAPSULES EVERY DAY Patient taking differently: Take 225 mg by mouth daily with breakfast. TAKE 3 CAPSULES EVERY DAY 04/04/18  Yes Alveda Reasons, MD     All other systems have been reviewed and were otherwise negative with the exception of those mentioned in the HPI and as above.  Physical Exam: Vitals:   04/13/18 0939  BP: 121/74  Pulse: 75  Resp: 18  Temp: 97.8 F (36.6 C)  SpO2: 100%    Body mass index is 25.69 kg/m.  General: Alert,  no acute distress Cardiovascular: No pedal edema Respiratory: No cyanosis, no use of accessory musculature Skin: No lesions in the area of chief complaint Neurologic: Sensation intact distally Psychiatric: Patient is competent for consent with normal mood and affect Lymphatic: No axillary or cervical lymphadenopathy  Assessment/Plan: SPONDYLOLISTHESIS WITH L4-L5 AND L5-S1 SPINAL STENOSIS Plan for Procedure(s): RIGHT-SIDED LUMBAR 4-5 AND LUMBAR 5-SACRUM 1 TRANSFORAMINAL LUMBAR INTERBODY FUSION WITH INSTRUMENTATION AND ALLOGRAFT   Sinclair Ship, MD 04/13/2018 12:43 PM

## 2018-04-13 NOTE — Anesthesia Procedure Notes (Signed)
Procedure Name: Intubation Date/Time: 04/13/2018 1:28 PM Performed by: Lance Coon, CRNA Pre-anesthesia Checklist: Patient identified, Emergency Drugs available, Suction available, Patient being monitored and Timeout performed Patient Re-evaluated:Patient Re-evaluated prior to induction Oxygen Delivery Method: Circle system utilized Preoxygenation: Pre-oxygenation with 100% oxygen Induction Type: IV induction Ventilation: Mask ventilation without difficulty Laryngoscope Size: Miller and 3 Grade View: Grade I Tube type: Oral Tube size: 7.0 mm Number of attempts: 1 Airway Equipment and Method: Stylet Placement Confirmation: ETT inserted through vocal cords under direct vision,  positive ETCO2 and breath sounds checked- equal and bilateral Secured at: 22 cm Tube secured with: Tape Dental Injury: Teeth and Oropharynx as per pre-operative assessment

## 2018-04-13 NOTE — OR Nursing (Signed)
Patient's  care assumed from Odelia Gage RN at [phone].

## 2018-04-13 NOTE — Transfer of Care (Signed)
Immediate Anesthesia Transfer of Care Note  Patient: Sherry Anderson  Procedure(s) Performed: RIGHT-SIDED LUMBAR 4-5 AND LUMBAR 5-SACRUM 1 TRANSFORAMINAL LUMBAR INTERBODY FUSION WITH INSTRUMENTATION AND ALLOGRAFT (N/A Back)  Patient Location: PACU  Anesthesia Type:General  Level of Consciousness: drowsy  Airway & Oxygen Therapy: Patient Spontanous Breathing and Patient connected to face mask oxygen  Post-op Assessment: Report given to RN, Post -op Vital signs reviewed and stable and Patient moving all extremities X 4  Post vital signs: Reviewed and stable  Last Vitals:  Vitals Value Taken Time  BP 127/63 04/13/2018  7:10 PM  Temp    Pulse 107 04/13/2018  7:10 PM  Resp 14 04/13/2018  7:10 PM  SpO2 100 % 04/13/2018  7:10 PM  Vitals shown include unvalidated device data.  Last Pain:  Vitals:   04/13/18 1056  TempSrc:   PainSc: 6          Complications: No apparent anesthesia complications

## 2018-04-13 NOTE — OR Nursing (Signed)
Patient care assumed at 1710.

## 2018-04-13 NOTE — Anesthesia Postprocedure Evaluation (Signed)
Anesthesia Post Note  Patient: Khamya Topp  Procedure(s) Performed: RIGHT-SIDED LUMBAR 4-5 AND LUMBAR 5-SACRUM 1 TRANSFORAMINAL LUMBAR INTERBODY FUSION WITH INSTRUMENTATION AND ALLOGRAFT (N/A Back)     Patient location during evaluation: PACU Anesthesia Type: General Level of consciousness: awake Pain management: pain level controlled Vital Signs Assessment: post-procedure vital signs reviewed and stable Respiratory status: spontaneous breathing Cardiovascular status: stable Postop Assessment: no apparent nausea or vomiting Anesthetic complications: no    Last Vitals:  Vitals:   04/13/18 1930 04/13/18 1935  BP: (!) 92/56 (!) 92/56  Pulse: 95 100  Resp: 16 15  Temp:    SpO2: 100% 100%    Last Pain:  Vitals:   04/13/18 1935  TempSrc:   PainSc: 6                  Lanasia Porras

## 2018-04-13 NOTE — Op Note (Signed)
MEDICAL RECORD NO.: [id number]   PHYSICIAN:  Phylliss Bob, MD      DATE OF BIRTH:  24-Mar-1961   DATE OF PROCEDURE:  04/13/2018                                                            OPERATIVE REPORT     PREOPERATIVE DIAGNOSES: 1. Right-sided lumbar radiculopathy. 2. L4-5, L5/S1 spinal stenosis. 3. Severe degenerative disc disease, L4/5, L5/S1 4. L4-5, L5-S1 spondylolisthesis   POSTOPERATIVE DIAGNOSES: 1. Right-sided lumbar radiculopathy. 2. L4-5, L5/S1 spinal stenosis. 3. Severe degenerative disc disease, L4/5, L5/S1 4. L4-5, L5-S1 spondylolisthesis   PROCEDURES: 1. Decompression L4-5, L5-S1. 2. Right-sided L4/5, L5/S1 transforaminal lumbar interbody fusion. 3. Left-sided L4-5, L5/S1 posterolateral fusion. 4. Insertion of interbody device x2 (Concorde intervertebral spacers). 5. Placement of posterior instrumentation L4, L5, S1 bilaterally. 6. Use of local autograft. 7. Use of morselized allograft - DBX-mix. 8. Intraoperative use of fluoroscopy.   SURGEON:  Phylliss Bob, MD.   ASSISTANTPricilla Holm, PA-C.   ANESTHESIA:  General endotracheal anesthesia.   COMPLICATIONS:  None.   DISPOSITION:  Stable.   ESTIMATED BLOOD LOSS:  250cc   INDICATIONS FOR SURGERY:  Briefly,  Ms. Pugmire a pleasant 57 year old female who did present to me with severe and ongoing pain in the right leg.   Her symptoms have been present for well over 4 years.  I did feel that the symptoms were secondary to the findings noted above. She did fail conservative care and did wish to proceed with the procedure noted above.   OPERATIVE DETAILS:  On  04/13/2018, the patient was brought to surgery and general endotracheal anesthesia was administered.  The patient was placed prone on a well-padded flat Jackson bed with a spinal frame.  Antibiotics were given and a time-out procedure was performed. The back was prepped and draped in the usual fashion.  A midline incision was made overlying  the L4-5 and L5-S1 intervertebral spaces.  The fascia was incised at the midline.  The paraspinal musculature was bluntly swept laterally.  Anatomic landmarks for the pedicles were exposed. Using fluoroscopy, I did cannulate the L4, L5, and S1 pedicles bilaterally, using a medial to lateral cortical trajectory technique at L4 and L5, and using a straight/medial trajectory technique at S1 bilaterally.  On the left side, the posterolateral gutter and left facet joint at L4-5 and L5-S1 was decorticated and 6 mm screws of the appropriate length were placed at L4 and L5, and a 7 mm screw was placed at S1 and a 65-mm rod was placed and distraction was applied across the rod at each intervertebral level.  On the right side, the cannulated pedicle holes were filled with bone wax.  I then proceeded with the decompressive aspect of the procedure. Starting at L5-S1, I did perform a laminotomy and a full facetectomy on the right.  I did also remove ligamentum flavum on the left side as well, decompressing both the right and left lateral recesses. Once this was done, with an assistant holding medial retraction of the traversing right S1 nerve, I did perform a thorough and complete L5-S1 intervertebral discectomy.  The intervertebral space was then liberally packed with autograft from the decompression, as well as allograft in the form of DBX-mix, as was the appropriately  sized intervertebral spacer (11 mm).  Distraction was then released on the contralateral left side.  I then turned my attention to the L4-5 level.  Once again, it was clearly evident that there was rather severe stenosis on the right and left sides.  The compression was addressed by performing a decompression on the right and left sides, performing a bilateral partial facetectomy with partial removal of the ligamentum flavum bilaterally.  Again, I was able to thoroughly decompress both the right L4 and right L5 nerves. With the assistant holding medial  retraction of the traversing right L5 nerve, I did perform an annulotomy at the posterolateral aspect of the L4-5 intervertebral space.  I then used a series of curettes and pituitary rongeurs to perform a thorough and complete intervertebral diskectomy.  The intervertebral space was then liberally packed with autograft as well as allograft in the form of DBX-mix, as was the appropriate-sized intervertebral spacer (12 mm).  The spacer was then tamped into position in the usual fashion.  I was very pleased with the press-fit of the spacer.  I then placed 6 mm screws on the right at L4 and L5, and an 8 mm screw at S1 on the right side.  A 65-mm rod was then placed and caps were placed. The distraction was then released on the contralateral left side.  All 6 caps were then locked.  The wound was copiously irrigated with a total of approximately 3 L prior to placing the bone graft.  Additional autograft and allograft were then packed into the posterolateral gutter on the left side to help aid in the success of the fusion.  The wound was  explored for any undue bleeding and there was no substantial bleeding encountered.  Gel-Foam was placed over the laminectomy site.  The wound was then closed in layers using #1 Vicryl followed by 2-0 Vicryl, followed by 4-0 Monocryl.  Benzoin and Steri-Strips were applied followed by sterile dressing.   Of note, did use triggered EMG to test the screws on the left, and there is no screw the tested below 20 mA.  There was no sustained abnormal EMG activity noted throughout the surgery.   Of note, Pricilla Holm was my assistant throughout surgery, and did aid in retraction, suctioning, and closure.     Phylliss Bob, MD

## 2018-04-13 NOTE — Plan of Care (Signed)
  Problem: Education: Goal: Knowledge of General Education information will improve Description Including pain rating scale, medication(s)/side effects and non-pharmacologic comfort measures Outcome: Progressing   

## 2018-04-14 ENCOUNTER — Other Ambulatory Visit: Payer: Self-pay

## 2018-04-14 MED ORDER — METHOCARBAMOL 500 MG PO TABS: 500.0000 mg | ORAL_TABLET | Freq: Four times a day (QID) | ORAL | Status: DC | PRN

## 2018-04-14 MED ORDER — TRIAMTERENE-HCTZ 37.5-25 MG PO TABS
1.0000 | ORAL_TABLET | Freq: Every day | ORAL | Status: DC
  Filled 2018-04-14: qty 1

## 2018-04-14 MED ORDER — HYDROXYZINE HCL 50 MG/ML IM SOLN
50.0000 mg | Freq: Four times a day (QID) | INTRAMUSCULAR | Status: DC | PRN
  Administered 2018-04-14: 50 mg via INTRAMUSCULAR
  Filled 2018-04-14: qty 1

## 2018-04-14 MED ORDER — METHOCARBAMOL 500 MG PO TABS: 500.0000 mg | ORAL_TABLET | Freq: Four times a day (QID) | ORAL | 0 refills | Status: DC | PRN

## 2018-04-14 NOTE — Evaluation (Signed)
Occupational Therapy Evaluation Patient Details Name: Sherry Anderson MRN: [id number] DOB: 08/02/1961 Today's Date: 04/14/2018    History of Present Illness Pt is as 57 y/o female s/p R sided L 4-5 and L 5-S1 transforaminal lumbar interbody fusion after presenting with ongoing R LE pain.  PMH significant for but not limited to: CA breast, depression, anxiety, HTN, hepatitis.    Clinical Impression   PTA patient reports independent and working, limited IADLs due to pain.  She currently requires setup assist for UB ADL, min to mod assist for LB ADL, supervision assist for toilet transfers, and supervision for toileting/grooming.   She was educated on precautions, brace management/wear schedule, compensatory techniques for ADLs, AE and DME recommendations, and positioning.  She plans to dc to her brothers home and will have 24/7 support from her sister for 2 weeks.  Based on performance today and support at home, all patients needs have been met and OT is signing off.  Thank you for this referral!    Follow Up Recommendations  No OT follow up;Supervision/Assistance - 24 hour    Equipment Recommendations  3 in 1 bedside commode    Recommendations for Other Services PT consult     Precautions / Restrictions Precautions Precautions: Back Precaution Booklet Issued: Yes (comment) Precaution Comments: reviewed precautions  Required Braces or Orthoses: Spinal Brace Spinal Brace: Thoracolumbosacral orthotic;Other (comment)(on upon therapist entering room) Spinal Brace Comments: reviewed techique to don/doff brace with pt and sister Restrictions Weight Bearing Restrictions: No      Mobility Bed Mobility               General bed mobility comments: seated EOB upon entry  Transfers Overall transfer level: Needs assistance   Transfers: Sit to/from Stand Sit to Stand: Min guard         General transfer comment: cueing for hand placement and safety/body mechanics    Balance  Overall balance assessment: Mild deficits observed, not formally tested                                         ADL either performed or assessed with clinical judgement   ADL Overall ADL's : Needs assistance/impaired Eating/Feeding: Set up;Sitting   Grooming: Standing;Supervision/safety;Cueing for compensatory techniques Grooming Details (indicate cue type and reason): education provided on compensatory techniques to adhere to back precautions  Upper Body Bathing: Set up;Sitting   Lower Body Bathing: Moderate assistance;Sit to/from stand;Cueing for compensatory techniques;Cueing for back precautions Lower Body Bathing Details (indicate cue type and reason): education provided on compensatory techniques to adhere to back precautions, unable to complete figure 4 technique and educated on LHSP and lateral lean to wash bottom  Upper Body Dressing : Set up;Sitting   Lower Body Dressing: Sit to/from stand;Minimal assistance;With adaptive equipment;Cueing for compensatory techniques;Cueing for back precautions Lower Body Dressing Details (indicate cue type and reason): education provided on compensatory techniques to adhere to back precautions, return demonstrated use of reacher and sock aide with supervision; unable to complete figure 4 technique   Toilet Transfer: Supervision/safety;BSC;Ambulation;RW(3:1 over toilet) Toilet Transfer Details (indicate cue type and reason): cueing for safety, body mechanics and walker mgmt  Toileting- Clothing Manipulation and Hygiene: Supervision/safety;Cueing for safety;Cueing for compensatory techniques;Adhering to back precautions;Sit to/from stand Toileting - Clothing Manipulation Details (indicate cue type and reason): education provided on compensatory techniques to adhere to back precautions, completing with cueing only  Tub/  Shower Transfer: Min guard;Ambulation;3 in 1;Rolling walker;Adhering to back precautions Tub/Shower Transfer Details  (indicate cue type and reason): demonstrated technique for reverse step over ledge, agreeable to have assistance  Functional mobility during ADLs: Min guard;Rolling walker General ADL Comments: increased time and effort, pt fatigues and c/o back pain; good adherance to precautions without cueing      Vision   Vision Assessment?: No apparent visual deficits     Perception     Praxis      Pertinent Vitals/Pain Pain Assessment: Faces Faces Pain Scale: Hurts even more Pain Location: back incision Pain Descriptors / Indicators: Discomfort;Operative site guarding Pain Intervention(s): Limited activity within patient's tolerance;Repositioned     Hand Dominance Right   Extremity/Trunk Assessment Upper Extremity Assessment Upper Extremity Assessment: Overall WFL for tasks assessed   Lower Extremity Assessment Lower Extremity Assessment: Defer to PT evaluation   Cervical / Trunk Assessment Cervical / Trunk Assessment: Other exceptions Cervical / Trunk Exceptions: s/p lumbar sx   Communication Communication Communication: No difficulties   Cognition Arousal/Alertness: Lethargic Behavior During Therapy: WFL for tasks assessed/performed Overall Cognitive Status: Within Functional Limits for tasks assessed                                     General Comments  sister present and supportive, plans to assist for 2 weeks    Exercises     Shoulder Instructions      Home Living Family/patient expects to be discharged to:: Private residence Living Arrangements: Alone Available Help at Discharge: Family;Available 24 hours/day Type of Home: House Home Access: Stairs to enter [employer] of Steps: 1 Entrance Stairs-Rails: None Home Layout: One level     Bathroom Shower/Tub: Occupational psychologist: Standard     Home Equipment: Shower seat;Grab bars - toilet   Additional Comments: plans to have sister assist for 2 weeks, setup provided for  her brothers home which they will be staying at       Prior Functioning/Environment Level of Independence: Independent        Comments: independent and working, reports limited iADLs due to pain and required increased time with all tasks         OT Problem List: Decreased activity tolerance;Impaired balance (sitting and/or standing);Decreased safety awareness;Pain;Decreased knowledge of use of DME or AE;Decreased knowledge of precautions      OT Treatment/Interventions:      OT Goals(Current goals can be found in the care plan section) Acute Rehab OT Goals Patient Stated Goal: to get home OT Goal Formulation: With patient  OT Frequency:     Barriers to D/C:            Co-evaluation              AM-PAC PT "6 Clicks" Daily Activity     Outcome Measure Help from another person eating meals?: None Help from another person taking care of personal grooming?: None Help from another person toileting, which includes using toliet, bedpan, or urinal?: A Little Help from another person bathing (including washing, rinsing, drying)?: A Little Help from another person to put on and taking off regular upper body clothing?: None Help from another person to put on and taking off regular lower body clothing?: A Little 6 Click Score: 21   End of Session Equipment Utilized During Treatment: Gait belt;Rolling walker;Back brace Nurse Communication: Mobility status  Activity Tolerance: Patient tolerated  treatment well Patient left: with call bell/phone within reach;with nursing/sitter in room(seated EOB)  OT Visit Diagnosis: Unsteadiness on feet (R26.81);Pain;Muscle weakness (generalized) (M62.81) Pain - part of body: (back)                Time: 4469-5072 OT Time Calculation (min): 29 min Charges:  OT General Charges $OT Visit: 1 Visit OT Evaluation $OT Eval Low Complexity: 1 Low OT Treatments $Self Care/Home Management : 8-22 mins  Delight Stare, OTR/L  Pager  [place] 04/14/2018, 9:07 AM

## 2018-04-14 NOTE — Evaluation (Signed)
Physical Therapy Evaluation Patient Details Name: Sherry Anderson MRN: [id number] DOB: 1960-09-10 Today's Date: 04/14/2018   History of Present Illness  Pt is as 57 y/o female s/p R sided L 4-5 and L 5-S1 transforaminal lumbar interbody fusion after presenting with ongoing R LE pain.  PMH significant for but not limited to: CA breast, depression, anxiety, HTN, hepatitis.   Clinical Impression  Patient evaluated by Physical Therapy with no further acute PT needs identified. Patient ambulating in hallway with walker with slow and steady gait speed. Able to negotiate 5 steps sideways utilizing right railing with both hands to simulate home environment. Reviewed generalized walking program, activity recommendations, and car transfer. All education has been completed and the patient has no further questions. See below for any follow-up Physical Therapy or equipment needs. PT is signing off. Thank you for this referral.     Follow Up Recommendations No PT follow up;Supervision for mobility/OOB    Equipment Recommendations  Rolling walker with 5" wheels;3in1 (PT)    Recommendations for Other Services       Precautions / Restrictions Precautions Precautions: Back Precaution Booklet Issued: Yes (comment) Precaution Comments: reviewed precautions  Required Braces or Orthoses: Spinal Brace Spinal Brace: Thoracolumbosacral orthotic Spinal Brace Comments: reviewed techique to don/doff brace with pt and sister Restrictions Weight Bearing Restrictions: No      Mobility  Bed Mobility               General bed mobility comments: received in hallway   Transfers Overall transfer level: Needs assistance Equipment used: Rolling walker (2 wheeled) Transfers: Sit to/from Stand Sit to Stand: Min guard         General transfer comment: cues for hand placement and safe body mechanics  Ambulation/Gait Ambulation/Gait assistance: Min guard Gait Distance (Feet): 350 Feet Assistive  device: Rolling walker (2 wheeled) Gait Pattern/deviations: Step-through pattern;Narrow base of support;Decreased step length - left Gait velocity: decreased   General Gait Details: cues for walker proximity to maintain upright positioning  Stairs Stairs: Yes Stairs assistance: Min guard Stair Management: One rail Right;Sideways;Step to pattern Number of Stairs: 5 General stair comments: performed sideways technique with bilateral hand support on right railing  Wheelchair Mobility    Modified Rankin (Stroke Patients Only)       Balance Overall balance assessment: Mild deficits observed, not formally tested                                           Pertinent Vitals/Pain Pain Assessment: Faces Faces Pain Scale: Hurts even more Pain Location: back incision Pain Descriptors / Indicators: Discomfort;Operative site guarding Pain Intervention(s): Monitored during session    Home Living Family/patient expects to be discharged to:: Private residence Living Arrangements: Alone Available Help at Discharge: Family;Available 24 hours/day Type of Home: House Home Access: Stairs to enter Entrance Stairs-Rails: Right Entrance Stairs-Number of Steps: 5 Home Layout: One level Home Equipment: Shower seat;Grab bars - toilet Additional Comments: plans to have sister assist for 2 weeks, setup provided for her brothers home which they will be staying at     Prior Function Level of Independence: Independent         Comments: independent and working, reports limited iADLs due to pain and required increased time with all tasks      Hand Dominance   Dominant Hand: Right    Extremity/Trunk Assessment   Upper  Extremity Assessment Upper Extremity Assessment: Overall WFL for tasks assessed    Lower Extremity Assessment Lower Extremity Assessment: Overall WFL for tasks assessed    Cervical / Trunk Assessment Cervical / Trunk Assessment: Other exceptions Cervical  / Trunk Exceptions: s/p lumbar sx  Communication   Communication: No difficulties  Cognition Arousal/Alertness: Lethargic Behavior During Therapy: WFL for tasks assessed/performed Overall Cognitive Status: Within Functional Limits for tasks assessed                                        General Comments General comments (skin integrity, edema, etc.): sister present     Exercises     Assessment/Plan    PT Assessment Patent does not need any further PT services  PT Problem List         PT Treatment Interventions      PT Goals (Current goals can be found in the Care Plan section)  Acute Rehab PT Goals Patient Stated Goal: to get home PT Goal Formulation: All assessment and education complete, DC therapy    Frequency     Barriers to discharge        Co-evaluation               AM-PAC PT "6 Clicks" Daily Activity  Outcome Measure Difficulty turning over in bed (including adjusting bedclothes, sheets and blankets)?: A Little Difficulty moving from lying on back to sitting on the side of the bed? : A Little Difficulty sitting down on and standing up from a chair with arms (e.g., wheelchair, bedside commode, etc,.)?: A Little Help needed moving to and from a bed to chair (including a wheelchair)?: A Little Help needed walking in hospital room?: A Little Help needed climbing 3-5 steps with a railing? : A Little 6 Click Score: 18    End of Session Equipment Utilized During Treatment: Gait belt;Back brace Activity Tolerance: Patient tolerated treatment well Patient left: in bed;with call bell/phone within reach;with family/visitor present Nurse Communication: Mobility status PT Visit Diagnosis: Difficulty in walking, not elsewhere classified (R26.2);Pain Pain - part of body: (back)    Time: 4142-3953 PT Time Calculation (min) (ACUTE ONLY): 20 min   Charges:   PT Evaluation $PT Eval Low Complexity: 1 Low     Ellamae Sia, PT, DPT Acute  Rehabilitation Services  Pager: [phone]   Willy Eddy 04/14/2018, 9:51 AM

## 2018-04-14 NOTE — Progress Notes (Signed)
Patient is discharged from room 3C08 at this time. Alert and in stable condition. IV site d/c'd and instructions read to patient and sister with understanding verbalized. Left unit via wheelchair with all belongings at side.

## 2018-04-14 NOTE — Progress Notes (Signed)
    Patient doing well PO day one with resolved leg pain and expected PO LBP. She mostly has been having a hard time with nausea. Zofran has not helped and she is pending nausea injection. She states the meds have made her a bit sleepy but otherwise she is feeling OK, normal B/B function. She has sat up but has not gotten up with PT.OT yet, OT at bedside for eval   Physical Exam: Vitals:   04/13/18 2333 04/14/18 0339  BP: 103/64 (!) 88/55  Pulse: 99 77  Resp: 18 18  Temp: 97.9 F (36.6 C) 98.8 F (37.1 C)  SpO2: 100% 99%    Dressing in place, CDI, TLSO brace worn appropriately, pt sitting up at edge of bed NVI  POD #1 s/p 2 level TLIF, doing well  - up with PT/OT, encourage ambulation - Percocet for pain, D/C Valium for muscle spasms, use robaxin - likely d/c home today with f/u in 2 weeks

## 2018-04-18 ENCOUNTER — Encounter (HOSPITAL_COMMUNITY): Payer: Self-pay | Admitting: Orthopedic Surgery

## 2018-04-19 MED FILL — Thrombin (Recombinant) For Soln 20000 Unit: CUTANEOUS | Qty: 1 | Status: AC

## 2018-04-19 MED FILL — Sodium Chloride IV Soln 0.9%: INTRAVENOUS | Qty: 1000 | Status: AC

## 2018-04-19 MED FILL — Heparin Sodium (Porcine) Inj 1000 Unit/ML: INTRAMUSCULAR | Qty: 30 | Status: AC

## 2018-04-27 NOTE — Discharge Summary (Signed)
Patient ID: Sherry Anderson MRN: [id number] DOB/AGE: Dec 19, 1960 57 y.o.  Admit date: 04/13/2018 Discharge date: 04/14/2018  Admission Diagnoses:  Active Problems:   Radiculopathy   Discharge Diagnoses:  Same  Past Medical History:  Diagnosis Date  . Adenocarcinoma in situ (AIS) of uterine cervix 05/2017   Associated with high-grade dysplasia  . Anxiety and depression   . Cancer [facility]) 2002   left breast cancer   . Depression   . Diverticulosis of colon   . Essential hypertension, benign   . GERD (gastroesophageal reflux disease)   . Hepatitis    unsure of which type  . High grade squamous intraepithelial cervical dysplasia 05/2017   Associated with adenocarcinoma in situ  . History of adenomatous polyp of colon    tubular adenoma's  02-08-2017  . History of gastritis   . History of left breast cancer 2002---- per pt no recurrence   dx DCIS --- s/p  left breast lumpectomy;  per pt radiation therapy completed same year and completed antiestrogen therapy   . History of radiation therapy    2002   left breast  . Hypothyroidism   . Spinal stenosis   . Spondylolisthesis   . SVD (spontaneous vaginal delivery)    x 1    Surgeries: Procedure(s): RIGHT-SIDED LUMBAR 4-5 AND LUMBAR 5-SACRUM 1 TRANSFORAMINAL LUMBAR INTERBODY FUSION WITH INSTRUMENTATION AND ALLOGRAFT on 04/13/2018   Consultants:   Discharged Condition: Improved  Hospital Course: Sherry Anderson is an 57 y.o. female who was admitted 04/13/2018 for operative treatment of radiculopathy. Patient has severe unremitting pain that affects sleep, daily activities, and work/hobbies. After pre-op clearance the patient was taken to the operating room on 04/13/2018 and underwent  Procedure(s): RIGHT-SIDED LUMBAR 4-5 AND LUMBAR 5-SACRUM 1 TRANSFORAMINAL LUMBAR INTERBODY FUSION WITH INSTRUMENTATION AND ALLOGRAFT.    Patient was given perioperative antibiotics:  Anti-infectives (From admission, onward)   Start      Dose/Rate Route Frequency Ordered Stop   04/14/18 0200  ceFAZolin (ANCEF) IVPB 2g/100 mL premix  Status:  Discontinued     2 g 200 mL/hr over 30 Minutes Intravenous Every 8 hours 04/13/18 2041 04/14/18 1759   04/13/18 1000  ceFAZolin (ANCEF) IVPB 2g/100 mL premix     2 g 200 mL/hr over 30 Minutes Intravenous On call to O.R. 04/13/18 0955 04/13/18 1751       Patient was given sequential compression devices, early ambulation to prevent DVT.  Patient benefited maximally from hospital stay and there were no complications.     Recent vital signs: BP (!) 102/56 (BP Location: Right Arm)   Pulse 84   Temp 98.9 F (37.2 C) (Oral)   Resp 16   Ht 5\' 3"  (1.6 m)   Wt 65.8 kg   LMP  (LMP Unknown)   SpO2 100%   BMI 25.69 kg/m    Discharge Medications:   Allergies as of 04/14/2018   No Known Allergies     Medication List    TAKE these medications   buPROPion 150 MG 24 hr tablet Commonly known as:  WELLBUTRIN XL Take 2 tablets (300 mg total) by mouth daily.   CALCIUM + D PO Take 1 tablet by mouth 2 (two) times daily.   levothyroxine 50 MCG tablet Commonly known as:  SYNTHROID, LEVOTHROID Take 1 tablet (50 mcg total) by mouth daily.   lisinopril 20 MG tablet Commonly known as:  PRINIVIL,ZESTRIL Take 1 tablet (20 mg total) by mouth daily.   methocarbamol 500 MG tablet  Commonly known as:  ROBAXIN Take 1 tablet (500 mg total) by mouth every 6 (six) hours as needed for muscle spasms.   omeprazole 20 MG capsule Commonly known as:  PRILOSEC TAKE 1 CAPSULE BY MOUTH TWICE A DAY   oxyCODONE-acetaminophen 5-325 MG tablet Commonly known as:  PERCOCET/ROXICET Take 1 tablet by mouth every 8 (eight) hours as needed for severe pain.   triamterene-hydrochlorothiazide 37.5-25 MG capsule Commonly known as:  DYAZIDE Take 1 each (1 capsule total) by mouth daily.   venlafaxine XR 75 MG 24 hr capsule Commonly known as:  EFFEXOR-XR TAKE 3 CAPSULES EVERY DAY What changed:  See the new  instructions.       Diagnostic Studies: Dg Lumbar Spine 2-3 Views  Result Date: 04/13/2018 CLINICAL DATA:  RIGHT-SIDED LUMBAR 4-5 AND LUMBAR 5-SACRUM 1 TRANSFORAMINAL LUMBAR INTERBODY FUSION WITH INSTRUMENTATION AND ALLOGRAFT. EXAM: LUMBAR SPINE - 2-3 VIEW; DG C-ARM 61-120 MIN COMPARISON:  None. FINDINGS: Intraoperative fluoroscopic images demonstrate inter pedicular fixation hardware at the L4 through S1 levels. Hardware appears intact and appropriately positioned, with interbody grafts. Fluoroscopy was provided for 1 minute and 27 seconds. IMPRESSION: Intraoperative fluoroscopic images provided, as described above. No evidence of surgical complicating feature. Electronically Signed   By: Franki Cabot M.D.   On: 04/13/2018 19:33   Dg Lumbar Spine 1 View  Result Date: 04/13/2018 CLINICAL DATA:  LOCALIZATION FOR RIGHT-SIDED LUMBAR 4-5 AND LUMBAR 5-SACRUM 1 TRANSFORAMINAL LUMBAR INTERBODY FUSION WITH INSTRUMENTATION AND ALLOGRAFT EXAM: LUMBAR SPINE - 1 VIEW COMPARISON:  Lumbar spine MRI dated 01/18/2017. FINDINGS: Cross-table lateral shows surgical hardware/probes overlying the interspinous spaces at the level of the L3-4 and L5-S1 levels. IMPRESSION: Surgical hardware/probes overlying the interspinous spaces at the levels of the L3-4 and L5-S1 levels. Electronically Signed   By: Franki Cabot M.D.   On: 04/13/2018 19:31   Dg C-arm 1-60 Min  Result Date: 04/13/2018 CLINICAL DATA:  RIGHT-SIDED LUMBAR 4-5 AND LUMBAR 5-SACRUM 1 TRANSFORAMINAL LUMBAR INTERBODY FUSION WITH INSTRUMENTATION AND ALLOGRAFT. EXAM: LUMBAR SPINE - 2-3 VIEW; DG C-ARM 61-120 MIN COMPARISON:  None. FINDINGS: Intraoperative fluoroscopic images demonstrate inter pedicular fixation hardware at the L4 through S1 levels. Hardware appears intact and appropriately positioned, with interbody grafts. Fluoroscopy was provided for 1 minute and 27 seconds. IMPRESSION: Intraoperative fluoroscopic images provided, as described above. No evidence  of surgical complicating feature. Electronically Signed   By: Franki Cabot M.D.   On: 04/13/2018 19:33   Dg C-arm 1-60 Min  Result Date: 04/13/2018 CLINICAL DATA:  RIGHT-SIDED LUMBAR 4-5 AND LUMBAR 5-SACRUM 1 TRANSFORAMINAL LUMBAR INTERBODY FUSION WITH INSTRUMENTATION AND ALLOGRAFT. EXAM: LUMBAR SPINE - 2-3 VIEW; DG C-ARM 61-120 MIN COMPARISON:  None. FINDINGS: Intraoperative fluoroscopic images demonstrate inter pedicular fixation hardware at the L4 through S1 levels. Hardware appears intact and appropriately positioned, with interbody grafts. Fluoroscopy was provided for 1 minute and 27 seconds. IMPRESSION: Intraoperative fluoroscopic images provided, as described above. No evidence of surgical complicating feature. Electronically Signed   By: Franki Cabot M.D.   On: 04/13/2018 19:33   Dg C-arm 1-60 Min  Result Date: 04/13/2018 CLINICAL DATA:  RIGHT-SIDED LUMBAR 4-5 AND LUMBAR 5-SACRUM 1 TRANSFORAMINAL LUMBAR INTERBODY FUSION WITH INSTRUMENTATION AND ALLOGRAFT. EXAM: LUMBAR SPINE - 2-3 VIEW; DG C-ARM 61-120 MIN COMPARISON:  None. FINDINGS: Intraoperative fluoroscopic images demonstrate inter pedicular fixation hardware at the L4 through S1 levels. Hardware appears intact and appropriately positioned, with interbody grafts. Fluoroscopy was provided for 1 minute and 27 seconds. IMPRESSION: Intraoperative fluoroscopic images provided, as  described above. No evidence of surgical complicating feature. Electronically Signed   By: Franki Cabot M.D.   On: 04/13/2018 19:33    Disposition: Discharge disposition: 01-Home or Self Care       Discharge Instructions    Discharge patient   Complete by:  As directed    After PT/OT pending progress   Discharge disposition:  01-Home or Self Care   Discharge patient date:  04/14/2018     POD #1 s/p 2 level TLIF, doing well  - up with PT/OT, encourage ambulation - Percocet for pain, D/C Valium for muscle spasms, use robaxin -Written scripts for pain  signed and in chart -D/C instructions sheet printed and in chart -D/C today  -F/U in office 2 weeks   Signed: Justice Britain 04/27/2018, 7:45 AM

## 2018-06-04 ENCOUNTER — Other Ambulatory Visit: Payer: Self-pay | Admitting: Family Medicine

## 2018-06-08 ENCOUNTER — Encounter: Payer: Self-pay | Admitting: Family Medicine

## 2018-06-08 ENCOUNTER — Other Ambulatory Visit: Payer: Self-pay

## 2018-06-08 ENCOUNTER — Ambulatory Visit (INDEPENDENT_AMBULATORY_CARE_PROVIDER_SITE_OTHER): Payer: Self-pay | Admitting: Family Medicine

## 2018-06-08 VITALS — BP 112/68 | HR 77 | Temp 97.9°F | Ht 63.0 in | Wt 152.0 lb

## 2018-06-08 DIAGNOSIS — E038 Other specified hypothyroidism: Secondary | ICD-10-CM

## 2018-06-08 DIAGNOSIS — E039 Hypothyroidism, unspecified: Secondary | ICD-10-CM

## 2018-06-08 DIAGNOSIS — F39 Unspecified mood [affective] disorder: Secondary | ICD-10-CM

## 2018-06-08 DIAGNOSIS — I10 Essential (primary) hypertension: Secondary | ICD-10-CM

## 2018-06-08 NOTE — Patient Instructions (Signed)
Blood was taken today to check how well your thyroid medicine is working.  If the blood work is abnormal, Dr Jamirah Zelaya will call you.  If it is normal, we will send you a letter with the results.   Please consider contacting the [facility].  They have clinics that are free or cost only $100 at the beginning, then free for the rest of the dental care/surgeries.

## 2018-06-09 ENCOUNTER — Encounter: Payer: Self-pay | Admitting: Family Medicine

## 2018-06-09 LAB — TSH: TSH: 1.12 u[IU]/mL (ref 0.450–4.500)

## 2018-06-09 MED ORDER — LEVOTHYROXINE SODIUM 50 MCG PO TABS: 50.0000 ug | ORAL_TABLET | Freq: Every day | ORAL | 0 refills | Status: DC

## 2018-06-09 MED ORDER — VENLAFAXINE HCL ER 75 MG PO CP24: ORAL_CAPSULE | ORAL | 0 refills | Status: DC

## 2018-06-09 MED ORDER — LISINOPRIL 20 MG PO TABS: 20.0000 mg | ORAL_TABLET | Freq: Every day | ORAL | 1 refills | Status: DC

## 2018-06-09 NOTE — Assessment & Plan Note (Signed)
Adequate blood pressure control.  No evidence of new end organ damage.  Tolerating Lisinopril and Dyazide without significant adverse effects.  Plan to continue current blood pressure regiment.

## 2018-06-09 NOTE — Assessment & Plan Note (Addendum)
Established problem. Stable. Continue current therapy LT4 50 mcg daily on empty stomach Lab Results  Component Value Date   TSH 1.120 06/08/2018  Refills of LT4 50 mcg tab sent  Recheck 6 to 12 months or later per Dr Lennie Odor discretion.

## 2018-06-09 NOTE — Assessment & Plan Note (Signed)
Adequate symptom control. Tolerating Venlafaxine medication.  On antidepressant tx with Venlafaxine since 11/2014.  Will Continue current medication regiment.  90days supply.  No RF.  F/U with Dr Ardelia Mems.

## 2018-06-09 NOTE — Progress Notes (Signed)
Subjective:    Patient ID: Sherry Anderson, female    DOB: [birth year], 57 y.o.   MRN: [id number] Sherry Anderson is alone Sources of clinical information for visit is/are patient and past medical records. Nursing assessment for this office visit was reviewed with the patient for accuracy and revision.  Previous Report(s) Reviewed: historical medical records  Depression screen [facility] 2/9 06/08/2018  Decreased Interest 0  Down, Depressed, Hopeless 0  PHQ - 2 Score 0   Fall Risk  04/04/2017 03/02/2017  Falls in the past year? No No     Adult vaccines due  Topic Date Due  . TETANUS/TDAP  01/12/2027   There are no preventive care reminders to display for this patient.  Health Maintenance Due  Topic Date Due  . INFLUENZA VACCINE  03/16/2018     HPI  Subclinical Hypothyroidism Started of LT4 by Dr Ardelia Mems 01/2018 Patient presents for evaluation of thyroid function.  Symptoms consist of denies fatigue, weight changes, heat/cold intolerance, bowel/skin changes, flushing or CVS symptoms.  Previous thyroid studies include TSH 5.6 and 6.1 (H) with normal levels triiodothyronine free and free thyroxine.  Has Orange card but not card that covers vaccinations  Complains of bilateral high anterior discomfort, especially when talking.  Has seen two DDS, an orthodontis and oral surgeon without resolution of issue.  No worsening of pain with exertion.  Pain is constant, stable course, began over 6 months ago.  Aching pain.  Depression w/ anxiety - Dx 01/2018. Started in 11/2014 by Dr Ree Kida on Venlafaxine to tx increased fatigue & emotional lability manifesting from depression and anxiety over family members. - Currently unemployed as pt exceeded her FMLA fro her TAH/BSO and Lumbar discectomy with internal fixation within 12 months of each other.  - Has grandchildren she enjoys.  She has 3 sons, one in [place] in [employer]. -PHQ2 = 0 today  CHRONIC HYPERTENSION  Disease Monitoring  Blood  pressure range: not checking at home  Chest pain: no, but constant bil. superior anterior neck discomfort that is unrelated to exertion  Dyspnea: no   Claudication: no   Medication compliance: yes, taking lisinopril and Dyazide  Medication Side Effects  Lightheadedness: no   Urinary frequency: no   Edema: no   SH: Never smoked  Review of Systems See hpi    Objective:   Physical Exam VS reviewed Gen: NAD, groomed cooperative social HEENT: no proptosis, TM's clear,  Palpable thyroid without nodularity, TTP bilateral tonsillar LN areas of superior anterior neck.  No palpable LAN Cor: RRR, No M Lung: BCTA, no acc mm use Abd: nontender, ND, (+) BS Ext: lower legs without edema  CMET with normal SCr and SK+ 04/13/18     Assessment & Plan:  Visit Problem List with A/P  Subclinical hypothyroidism Established problem. Stable. Continue current therapy LT4 50 mcg daily on empty stomach Lab Results  Component Value Date   TSH 1.120 06/08/2018  Refills of LT4 50 mcg tab sent  Recheck 6 to 12 months or later per Dr Lennie Odor discretion.     Depression with anxiety Adequate symptom control. Tolerating Venlafaxine medication.  On antidepressant tx with Venlafaxine since 11/2014.  Will Continue current medication regiment.  90days supply.  No RF.  F/U with Dr Ardelia Mems.   HYPERTENSION, BENIGN ESSENTIAL Adequate blood pressure control.  No evidence of new end organ damage.  Tolerating Lisinopril and Dyazide without significant adverse effects.  Plan to continue current blood pressure regiment.

## 2018-06-14 ENCOUNTER — Other Ambulatory Visit: Payer: Self-pay | Admitting: Family Medicine

## 2018-06-14 ENCOUNTER — Telehealth: Payer: Self-pay | Admitting: Family Medicine

## 2018-06-14 ENCOUNTER — Encounter: Payer: Self-pay | Admitting: Family Medicine

## 2018-06-14 NOTE — Telephone Encounter (Signed)
Pt is calling and would like a referral so that she can see a dentist. She has the orange card.

## 2018-06-15 NOTE — Telephone Encounter (Signed)
I do not think any dentists take the orange card.

## 2018-06-16 ENCOUNTER — Telehealth: Payer: Self-pay | Admitting: Licensed Clinical Social Worker

## 2018-06-16 NOTE — Telephone Encounter (Addendum)
Type of Service: Clinical Social Work  [facility] intern phone call to patient to follow up on consult from Dr. Andria Frames for patient needing dental resources. Patient has orange card but new dental referrals aren't being accepted until January 2020. Patient reported she is in pain and her front tooth is gone and the gum has began to turn black. [facility] intern called reached out to dental clinics in the area and none are offering financial assistance. Intern left voicemail for [employer].   Plan: [facility] intern 1.  Will wait for a call from [facility] regarding dental services.  2. Follow up with patient in 5-7 business days to inform her of possible option at [facility] dental.  3. Will provide an update to Dr. Andria Frames.     Audry Riles, [place] intern Behavioral Health Clinician,  [place] [phone]

## 2018-06-21 ENCOUNTER — Other Ambulatory Visit: Payer: Self-pay | Admitting: Family Medicine

## 2018-06-21 ENCOUNTER — Telehealth: Payer: Self-pay | Admitting: *Deleted

## 2018-06-21 ENCOUNTER — Encounter: Payer: Self-pay | Admitting: Family Medicine

## 2018-06-21 DIAGNOSIS — K029 Dental caries, unspecified: Secondary | ICD-10-CM | POA: Insufficient documentation

## 2018-06-21 NOTE — Progress Notes (Signed)
Patient phone call reports dental problems, presumed caries.  Will refer to dentistry per her request.

## 2018-06-21 NOTE — Telephone Encounter (Signed)
HD only has tablets of triamterene - HCTZ.  They call to request approval to change to tablets instead of capsules.  Permission given. Fleeger, Salome Spotted, CMA

## 2018-06-22 ENCOUNTER — Telehealth: Payer: Self-pay | Admitting: *Deleted

## 2018-06-22 ENCOUNTER — Telehealth: Payer: Self-pay | Admitting: Licensed Clinical Social Worker

## 2018-06-22 NOTE — Progress Notes (Signed)
Type of Service: Clinical Social Work  [facility] intern phone call to patient to follow up with dental referral. [facility] intern notified patient that she would need to come in to the clinic to have her tooth looked at to determine by her doctor if it was emergent. Client stated that the root of the tooth is black. [facility] consulted Dr. McDiarmid to follow up on patients appointment on 10/24. Dr Wendy Poet did not see the tooth as emergent at the time but states that may have changed. Patient plans on calling today to get the appointment scheduled.   1. Patient will call to make an appointment with PCP. 2. [facility] intern will wait for determination from PCP on determination of severity on the tooth. If tooth is deemed severe [facility] intern will continue with urgent referral to [facility].  Audry Riles, [place] intern Behavioral Health Clinician,  [place] [phone]

## 2018-06-22 NOTE — Telephone Encounter (Signed)
This has been handled in a different phone message.  Pt was made aware that she would need to make an appointment before referral can be processed per the note. Katharina Caper, Vina Byrd D, [place]

## 2018-06-22 NOTE — Telephone Encounter (Signed)
-----   Message from Zenia Resides, MD sent at 06/21/2018 12:00 PM EST ----- Sherri or Rogene Meth, Let me bring you up to speed on this issue.  This Ardelia Mems patient called wanting a dental referral with it being covered by the orange card.  As you might imagine, there is no option for routine care.  We MAY be able to get her in for an emergency.  I have a form to fill out.  Bottom line: I can't fill out the form and call the referral emergent based on the phone call.  If she wants to pursue this, she needs an appointment with me (or Ardelia Mems back next week) to diagnose the actual problem, maybe put on antibiotics and then fill out the referral form.   Please call and see if she wants an appointment.  Thanks.  BH  PS I will keep the form in my [facility] box.   ----- Message ----- From: Audry Riles Sent: 06/21/2018  11:41 AM EST To: Zenia Resides, MD  Dr. Andria Frames I saw the order you placed I believe there may be a specific sheet for you to fill out for this patient. Kennyth Lose has given me the sheet. It is for her orange card for urgent dental services. I will put it in your mailbox and you can return it to either me or Neoma Laming.  ----- Message ----- From: Zenia Resides, MD Sent: 06/21/2018  10:39 AM EST To: Audry Riles  I place the order.  I hope it helps.  Let me know if I need to do anything else.   ----- Message ----- From: Audry Riles Sent: 06/21/2018   8:13 AM EST To: Zenia Resides, MD  Dr. Andria Frames, I got your referral for this patient for dental resources through the orange card. While they aren't accepting new referrals they encouraged me to send over a referral from you since its an emergency situation they may be able to work something out. I will follow up with Kennyth Lose about this. Would you mind doing a referral for this patient?

## 2018-06-23 NOTE — Telephone Encounter (Signed)
After investigation, unlikely to have resources.  We may have resources if emergency, e.g. On antibiotics for an abscessed tooth.  Patient informed to make an appointment if this is an emergency.  Form currently in my box if appointment is made.

## 2018-07-04 ENCOUNTER — Other Ambulatory Visit: Payer: Self-pay | Admitting: Family Medicine

## 2018-07-10 ENCOUNTER — Ambulatory Visit: Payer: Self-pay

## 2018-07-19 ENCOUNTER — Ambulatory Visit (INDEPENDENT_AMBULATORY_CARE_PROVIDER_SITE_OTHER): Payer: Self-pay

## 2018-07-19 DIAGNOSIS — Z23 Encounter for immunization: Secondary | ICD-10-CM

## 2018-07-27 ENCOUNTER — Telehealth: Payer: Self-pay | Admitting: Family Medicine

## 2018-07-27 DIAGNOSIS — M549 Dorsalgia, unspecified: Secondary | ICD-10-CM

## 2018-07-27 DIAGNOSIS — K0889 Other specified disorders of teeth and supporting structures: Secondary | ICD-10-CM

## 2018-07-27 NOTE — Telephone Encounter (Signed)
Will forward to MD to advise. Jazmin Hartsell,CMA  

## 2018-07-27 NOTE — Telephone Encounter (Signed)
Pt is calling and would like to have a referral to see an Ortho doctor, since she is recovering from her back surgery. jw

## 2018-07-27 NOTE — Telephone Encounter (Signed)
Does she need a referral for insurance purposes? If she had back surgery she should be able to continue seeing the same surgeon. If she needs a referral due to her insurance I am happy to put one in.  Thanks Leeanne Rio, MD

## 2018-07-28 ENCOUNTER — Ambulatory Visit: Payer: Self-pay | Admitting: Family Medicine

## 2018-07-29 NOTE — Telephone Encounter (Signed)
Discussed this with patient yesterday during her mom's appointment. She lost insurance and needs referral under orange card to any back doctor. Recently had back surgery. Also requests dental referral, has broken tooth in front. Not requiring pain medications or antibiotics right now. Will place referrals  Leeanne Rio, MD

## 2018-08-22 DIAGNOSIS — Z0289 Encounter for other administrative examinations: Secondary | ICD-10-CM

## 2018-08-25 ENCOUNTER — Other Ambulatory Visit: Payer: Self-pay | Admitting: *Deleted

## 2018-08-25 MED ORDER — OMEPRAZOLE 20 MG PO CPDR: 20.0000 mg | DELAYED_RELEASE_CAPSULE | Freq: Two times a day (BID) | ORAL | 2 refills | Status: DC

## 2018-09-08 ENCOUNTER — Telehealth: Payer: Self-pay

## 2018-09-08 DIAGNOSIS — F39 Unspecified mood [affective] disorder: Secondary | ICD-10-CM

## 2018-09-08 NOTE — Telephone Encounter (Signed)
Patient left message that [facility] does not carry Venlafaxine and she needs an alternative that she can get at that pharmacy.  Called and spoke with Butch Penny at [facility]. The meds they carry are: Zoloft, Paxil, Celexa, and Wellbutrin.  Danley Danker, RN [facility] RN)

## 2018-09-15 NOTE — Telephone Encounter (Signed)
Is patient still taking this medication? She should schedule follow up with me. Has not been seen since October.  If she is still taking the medication I can send in an alternative to hold her over until she can schedule an appointment with me, but I need to see her soon.  Thanks, Leeanne Rio, MD

## 2018-09-18 NOTE — Telephone Encounter (Signed)
Called patient to set up an appoint with Dr. Ardelia Mems.  There was no answer and a voice mail was left asking patient to call back.  When patient calls to make appointment, please ask patient if she is still needs an alternative to Venlafaxine?  The voice mail was non descriptive/ generic so I did not ask specific questions.  Thanks,  .Ozella Almond, CMA

## 2018-09-19 ENCOUNTER — Other Ambulatory Visit: Payer: Self-pay | Admitting: Family Medicine

## 2018-09-19 DIAGNOSIS — Z1231 Encounter for screening mammogram for malignant neoplasm of breast: Secondary | ICD-10-CM

## 2018-09-19 NOTE — Telephone Encounter (Signed)
Patient is currently wanting to stay on Venlafaxine. Patient states that she has found Venlafaxine at [facility] to be cheaper and she prefers to stay on that because it works better than any other medications that she has tried. Patient states that she is not interested in trying anything else.  Patient would like for all of her other medications to be sent to [facility].  Ozella Almond, [place]

## 2018-09-19 NOTE — Telephone Encounter (Signed)
Pt called back to schedule f/u appointment. I made her an appointment for 09/26/18.  Pt said that she does still need an alternative for Venlafaxine.

## 2018-09-19 NOTE — Telephone Encounter (Signed)
I need to know if she is still taking the venlafaxine. If she has stopped taking it we can immediately start another medication. Otherwise she will need to taper down on it. Please find this out and let me know.  Alternatively we can just discuss when I see her in 1 week. Thanks,  Leeanne Rio, MD

## 2018-09-21 MED ORDER — VENLAFAXINE HCL ER 75 MG PO CP24: ORAL_CAPSULE | ORAL | 0 refills | Status: DC

## 2018-09-21 NOTE — Telephone Encounter (Signed)
Ok. Will send rx for venlafaxine to [facility]. Leeanne Rio, MD

## 2018-09-26 ENCOUNTER — Ambulatory Visit (HOSPITAL_COMMUNITY)
Admission: RE | Admit: 2018-09-26 | Discharge: 2018-09-26 | Disposition: A | Discharge status: Home / Self Care | Payer: Self-pay | Source: Ambulatory Visit | Attending: Family Medicine | Admitting: Family Medicine

## 2018-09-26 ENCOUNTER — Ambulatory Visit (INDEPENDENT_AMBULATORY_CARE_PROVIDER_SITE_OTHER): Payer: Self-pay | Admitting: Licensed Clinical Social Worker

## 2018-09-26 ENCOUNTER — Ambulatory Visit (INDEPENDENT_AMBULATORY_CARE_PROVIDER_SITE_OTHER): Payer: Self-pay | Admitting: Family Medicine

## 2018-09-26 ENCOUNTER — Encounter: Payer: Self-pay | Admitting: Family Medicine

## 2018-09-26 VITALS — BP 115/75 | HR 75 | Temp 98.4°F | Wt 163.2 lb

## 2018-09-26 DIAGNOSIS — R0602 Shortness of breath: Secondary | ICD-10-CM

## 2018-09-26 DIAGNOSIS — R0600 Dyspnea, unspecified: Secondary | ICD-10-CM

## 2018-09-26 DIAGNOSIS — R5383 Other fatigue: Secondary | ICD-10-CM

## 2018-09-26 DIAGNOSIS — F329 Major depressive disorder, single episode, unspecified: Secondary | ICD-10-CM

## 2018-09-26 DIAGNOSIS — K219 Gastro-esophageal reflux disease without esophagitis: Secondary | ICD-10-CM

## 2018-09-26 DIAGNOSIS — F39 Unspecified mood [affective] disorder: Secondary | ICD-10-CM

## 2018-09-26 DIAGNOSIS — F32A Depression, unspecified: Secondary | ICD-10-CM

## 2018-09-26 DIAGNOSIS — E039 Hypothyroidism, unspecified: Secondary | ICD-10-CM

## 2018-09-26 DIAGNOSIS — E038 Other specified hypothyroidism: Secondary | ICD-10-CM

## 2018-09-26 NOTE — Patient Instructions (Signed)
Checking labs today - thyroid, kidneys, liver, blood counts, heart lab.  Call in 1 week to schedule an appointment with Kennyth Lose to discuss financial assistance. You will need to bring all the paperwork again.  We will have someone contact you about the mammogram scholarship.  Follow up with me in 2-3 weeks to see how you are doing.  Be well, Dr. Ardelia Mems

## 2018-09-26 NOTE — BH Specialist Note (Signed)
Type of Service: [place] is a 58 y.o. female: referred by Dr. Juluis Mire for: assistance with managing symptoms of depression.  Patient was able to complete PHQ-9 see below. She had to leave office without starting the visit due to previously scheduled appointment.  LCSW introduced self.  Patient is open to F/U phone call to schedule a visit with LCSW Plan: LCSW will call patient in 1 to 3 days to schedule a [facility] appointment.  Depression screen [facility] 2/9 09/26/2018 06/08/2018 04/04/2017  Decreased Interest 1 0 0  Down, Depressed, Hopeless 2 0 0  PHQ - 2 Score 3 0 0  Altered sleeping 3 - -  Tired, decreased energy 2 - -  Change in appetite 1 - -  Feeling bad or failure about yourself  3 - -  Trouble concentrating 3 - -  Moving slowly or fidgety/restless 1 - -  Suicidal thoughts 0 - -  PHQ-9 Score 16 - -  Difficult doing work/chores Very difficult - -   Casimer Lanius, [place]   [phone] 10:56 AM

## 2018-09-26 NOTE — Progress Notes (Signed)
Date of Visit: 09/26/2018   HPI:  Patient presents for follow up.  Subclinical hypothyroidism - currently taking levothyroxine 30mg daily. Has several symptoms she complains of and would like her thyroid checked. She is feeling very fatigued. Also notices she sweats a lot, especially in her scalp.   Depression - taking venlafaxine XR 2272mdaily (takes three 7581mapsules). Mood is overall better than before she took this medication, but still not great. Denies SI/HI. Tearful during this visit. Notices she is more irritable. Agreeable to seeing a counselor. Previously also took wellbutrin but says this made her shaky. Has not tolerated celexa in the past. She would be willing to do collaborative care with counselor and psychiatrist.  GERD - taking omeprazole 34m75mily. Tolerating it well. Feels symptoms are well controlled with this medication.   Dyspnea - this was not a complaint of patient's today, but noted on ROS.  Reports getting short of breath when walking, also some at night.  Has to stop and catch her breath when walking.  No cough. No chest pain.  This has been going on for about 3 to 4 months, has not sought care for it prior to now.  She does not smoke.  ROS: See HPI.  PMFSZapatastory of breast cancer, hyperlipidemia, hypertension, mood disorder, fatigue, anemia  PHYSICAL EXAM: BP 115/75   Pulse 75   Temp 98.4 F (36.9 C) (Oral)   Wt 163 lb 3.2 oz (74 kg)   LMP  (LMP Unknown)   SpO2 99%   BMI 28.91 kg/m  Gen: no acute distress, pleasant, cooperative HEENT: normocephalic, atraumatic, moist mucous membranes  Heart: regular rate and rhythm, no murmur Lungs: clear to auscultation bilaterally, normal work of breathing  Neuro: alert, speech normal, grossly nonfocal Ext: No appreciable lower extremity edema bilaterally  Psych: normal range of affect though predominantly tearful, well groomed, speech normal in rate and volume, normal eye contact  Abdomen: soft, nontender to  palpation   ASSESSMENT/PLAN:  Health maintenance:  -current on HM items  Subclinical hypothyroidism Update TSH today, titrate levothyroxine as needed based on this lab.  Fatigue due to depression Suspect many of her somatic symptoms are due to poorly controlled depression.  Checking lab work today including CBC, c-Met, BNP, and TSH to rule out organic causes of her symptoms.  Mood disorder (HCC)[place].  Initially patient was agreeable to meeting with DeboCasimer Laniusay for integrated care, also willing to do collaborative care with psychiatry.  She was unfortunately unable to stay due to needing to get to her mother's appointment.  No SI/HI.  She is going to follow-up with me in a couple of weeks.  GERD (gastroesophageal reflux disease) Well-controlled on omeprazole.  Continue this medication.  Dyspnea Symptoms are somewhat nonspecific.  Ambulatory pulse ox normal today.  EKG without concerning findings.  Patient has normal stress test within the last 2 years.  Will check CBC, complete metabolic panel, BNP, and TSH to evaluate for anemia or heart failure contributing.  Offered to schedule echo for patient, but she currently has no insurance and is working on gettHealth and safety inspectorfor financial assistance.  Prefers to wait until this is set up.  She will follow-up with me in a couple of weeks to see his how she is doing.  FOLLOW UP: Follow up in several weeks for above issues  [place]delia Mems [place]

## 2018-09-28 ENCOUNTER — Telehealth: Payer: Self-pay | Admitting: Licensed Clinical Social Worker

## 2018-09-28 DIAGNOSIS — R0600 Dyspnea, unspecified: Secondary | ICD-10-CM | POA: Insufficient documentation

## 2018-09-28 DIAGNOSIS — K219 Gastro-esophageal reflux disease without esophagitis: Secondary | ICD-10-CM | POA: Insufficient documentation

## 2018-09-28 LAB — CMP14+EGFR
ALT: 22 IU/L (ref 0–32)
AST: 20 IU/L (ref 0–40)
Albumin/Globulin Ratio: 1.6 (ref 1.2–2.2)
Albumin: 4.4 g/dL (ref 3.8–4.9)
Alkaline Phosphatase: 66 IU/L (ref 39–117)
BILIRUBIN TOTAL: 0.4 mg/dL (ref 0.0–1.2)
BUN/Creatinine Ratio: 17 (ref 9–23)
BUN: 14 mg/dL (ref 6–24)
CHLORIDE: 100 mmol/L (ref 96–106)
CO2: 25 mmol/L (ref 20–29)
Calcium: 10.1 mg/dL (ref 8.7–10.2)
Creatinine, Ser: 0.84 mg/dL (ref 0.57–1.00)
GFR calc Af Amer: 89 mL/min/{1.73_m2} (ref >59)
GFR calc non Af Amer: 77 mL/min/{1.73_m2} (ref >59)
Globulin, Total: 2.8 g/dL (ref 1.5–4.5)
Glucose: 93 mg/dL (ref 65–99)
Potassium: 4.8 mmol/L (ref 3.5–5.2)
Sodium: 140 mmol/L (ref 134–144)
Total Protein: 7.2 g/dL (ref 6.0–8.5)

## 2018-09-28 LAB — CBC
Hematocrit: 36.9 % (ref 34.0–46.6)
Hemoglobin: 12.1 g/dL (ref 11.1–15.9)
MCH: 27 pg (ref 26.6–33.0)
MCHC: 32.8 g/dL (ref 31.5–35.7)
MCV: 82 fL (ref 79–97)
Platelets: 350 10*3/uL (ref 150–450)
RBC: 4.48 x10E6/uL (ref 3.77–5.28)
RDW: 15.1 % (ref 11.7–15.4)
WBC: 5.9 10*3/uL (ref 3.4–10.8)

## 2018-09-28 LAB — TSH: TSH: 7.11 u[IU]/mL — AB (ref 0.450–4.500)

## 2018-09-28 LAB — BRAIN NATRIURETIC PEPTIDE: BNP: 20.5 pg/mL (ref 0.0–100.0)

## 2018-09-28 NOTE — Assessment & Plan Note (Signed)
Well-controlled on omeprazole.  Continue this medication.

## 2018-09-28 NOTE — Assessment & Plan Note (Signed)
Suspect many of her somatic symptoms are due to poorly controlled depression.  Checking lab work today including CBC, c-Met, BNP, and TSH to rule out organic causes of her symptoms.

## 2018-09-28 NOTE — Assessment & Plan Note (Signed)
Symptoms are somewhat nonspecific.  Ambulatory pulse ox normal today.  EKG without concerning findings.  Patient has normal stress test within the last 2 years.  Will check CBC, complete metabolic panel, BNP, and TSH to evaluate for anemia or heart failure contributing.  Offered to schedule echo for patient, but she currently has no insurance and is working on Health and safety inspector up for financial assistance.  Prefers to wait until this is set up.  She will follow-up with me in a couple of weeks to see his how she is doing.

## 2018-09-28 NOTE — Assessment & Plan Note (Signed)
Uncontrolled.  Initially patient was agreeable to meeting with Casimer Lanius today for integrated care, also willing to do collaborative care with psychiatry.  She was unfortunately unable to stay due to needing to get to her mother's appointment.  No SI/HI.  She is going to follow-up with me in a couple of weeks.

## 2018-09-28 NOTE — Telephone Encounter (Signed)
  09/28/2018 Name: Sherry Anderson MRN: [id number] DOB: [birth year]  Referred by: PCP, Dr.McIntyre  LCSW called [name] today reference scheduling IBH appointment. Left message for patient to call LCSW or front desk to schedule the appointment.  Plan:  LCSW will wait for return call, if no return call is received, will call in about 5 to 7 business days   Sherry Anderson, [place]   [phone] 11:39 AM

## 2018-09-28 NOTE — Assessment & Plan Note (Signed)
Update TSH today, titrate levothyroxine as needed based on this lab.

## 2018-09-29 ENCOUNTER — Encounter: Payer: Self-pay | Admitting: Family Medicine

## 2018-09-29 ENCOUNTER — Other Ambulatory Visit: Payer: Self-pay

## 2018-09-29 ENCOUNTER — Ambulatory Visit (INDEPENDENT_AMBULATORY_CARE_PROVIDER_SITE_OTHER): Payer: Self-pay | Admitting: Family Medicine

## 2018-09-29 ENCOUNTER — Telehealth: Payer: Self-pay | Admitting: Family Medicine

## 2018-09-29 VITALS — BP 126/62 | HR 94 | Temp 98.1°F | Wt 164.4 lb

## 2018-09-29 DIAGNOSIS — J069 Acute upper respiratory infection, unspecified: Secondary | ICD-10-CM

## 2018-09-29 DIAGNOSIS — M791 Myalgia, unspecified site: Secondary | ICD-10-CM

## 2018-09-29 MED ORDER — LEVOTHYROXINE SODIUM 75 MCG PO TABS: 75.0000 ug | ORAL_TABLET | Freq: Every day | ORAL | 1 refills | Status: DC

## 2018-09-29 MED ORDER — MELOXICAM 7.5 MG PO TABS: 7.5000 mg | ORAL_TABLET | Freq: Every day | ORAL | 0 refills | Status: DC

## 2018-09-29 NOTE — Progress Notes (Signed)
Subjective:    Patient ID: Sherry Anderson, female    DOB: Oct 22, 1960, 58 y.o.   MRN: [id number]   CC: trouble swallowing   HPI: Sherry Anderson is a 58 year old female presenting to discuss the following:   Trouble breathing/swallowing at night: Patient reports a couple day history of having trouble swallowing at nighttime.  She states she woke up from sleeping on Tuesday night with a dry mouth/hard time swallowing, drank a few sips of water, felt better and was able to go back to bed.  This has happened each night.  She additionally sleeps with her mouth open, states she drools often.  Denies any difficulty swallowing liquids or solids.  She has no trouble with the symptoms during the day. Patient also endorses associated bilateral headache, ear pains, sore throat, and generalized muscle aches around her bilateral shoulders.  She has been taking Tylenol with some relief.  Ibuprofen has not helped at all.  Endorses subjective fever, did not check it.  No sick contacts, did receive her flu shot this season.  Denies any associated unintentional weight loss, nausea/vomiting/change in bowel movements, severe night sweats, coughing, change in hearing/vision/taste, globus sensation, shortness of breath.  She has been drinking plenty of water and eating her normal diet without concern.  Able to do her normal ADLs.   Smoking status reviewed  Review of Systems Per HPI, also denies changes in vision, chest pain, shortness of breath, abdominal pain, N/V/D, weakness   Patient Active Problem List   Diagnosis Date Noted  . Viral upper respiratory illness 10/07/2018  . GERD (gastroesophageal reflux disease) 09/28/2018  . Dyspnea 09/28/2018  . Dental caries 06/21/2018  . Radiculopathy 04/13/2018  . Subclinical hypothyroidism 11/18/2017  . Atypical cervical glandular cells 04/21/2017  . Healthcare maintenance 04/04/2017  . Right-sided face pain 03/02/2017  . Tremor of right hand 11/07/2015  .  Right-sided sensorineural hearing loss 11/13/2014  . DDD (degenerative disc disease), lumbar 10/21/2014  . Knee pain, bilateral 11/06/2013  . Left wrist pain 03/02/2013  . Exertional angina (HCC) 03/30/2012  . Anemia 01/25/2012  . Right ear pain 01/19/2012  . HEADACHE 02/04/2010  . Hyperlipidemia 08/07/2009  . HEARING LOSS, RIGHT EAR 01/17/2008  . CARPAL TUNNEL SYNDROME, BILATERAL 07/31/2007  . LATERAL EPICONDYLITIS, BILATERAL 07/18/2007  . Fatigue due to depression 04/06/2007  . Gastritis and gastroduodenitis 11/21/2006  . Mood disorder ([place]) 10/17/2006  . HYPERTENSION, BENIGN ESSENTIAL 10/17/2006  . CA IN SITU, BREAST 02/15/2001     Objective:  BP 126/62   Pulse 94   Temp 98.1 F (36.7 C) (Oral)   Wt 164 lb 6.4 oz (74.6 kg)   LMP  (LMP Unknown)   SpO2 99%   BMI 29.12 kg/m  Vitals and nursing note reviewed  General: NAD, pleasant, sitting comfortably, smiling HEENT: Mucous membranes moist, airway patent with no abnormalities noted to tongue, oropharynx, or tonsils.  No erythema or exudates within posterior oropharynx or tonsils.  Posterior pharynx nonerythematous.  Bilateral tympanic membranes with appropriate light reflex.  No anterior or posterior cervical lymphadenopathy palpated.  No masses or swelling noted on anterior neck.  Cardiac: RRR, normal heart sounds Respiratory: CTAB, normal effort Abdomen: soft, nontender, nondistended Extremities: no edema or cyanosis. WWP.  Tenderness with palpation of bilateral trapezius muscles, full ROM and 5/5 upper extremity strength bilaterally.  No deformities noted. Skin: warm and dry, no rashes noted Neuro: alert and oriented, no focal deficits, speech appropriate, able to swallow normally Psych: normal affect  Assessment & Plan:   Viral upper respiratory illness Patient likely experiencing an upper respiratory infection, with associated dry mouth at night due to sore throat and nasal congestion.  No concerns for a swallowing  disorder at this time, patient without any difficulty eating or drinking. VSS and physical exam unremarkable, patient is nontoxic-appearing.  Recommended supportive care. - Provided short prescription for meloxicam to help with her additional shoulder pains that are troubling her. - Strict return precautions discussed, return to care if not improving in the next 1-2 weeks, or sooner if needed  In addition, noted her primary care provider tried to reach the patient during our visit.  Notified patient of her lab results additionally during our visit and informed her a prescription was waiting at her pharmacy for her thyroid.  Follow-up if symptoms not improving or worsening, keep already scheduled follow-up with PCP.  Darrelyn Hillock, DO Family Medicine Resident PGY-1

## 2018-09-29 NOTE — Telephone Encounter (Signed)
Attempted to reach patient to discuss lab results. No answer, LVM asking her to call back.  TSH was elevated, meaning we need to increase her thyroid medication. I sent in a new prescription to the health department. We should repeat the lab in about 6 weeks.  If patient calls back please let her know this. Red team, can you also try to contact patient on Monday if we have not heard form her by then?  Thanks Leeanne Rio, MD

## 2018-09-29 NOTE — Patient Instructions (Signed)
So nice to meet you today!  You likely have a viral illness that has been causing your muscle pains and ear pains, but will hopefully start getting better in the next 1-2 weeks.  I recommend you get some cough drops and take this at night to help with your dry mouth, next year drinking plenty of water.  Additionally I will send in a stronger NSAID to help with pain relief for the next week.  You may also use a heating pad on your shoulders for 20 minutes at a time, and it would be very nice of your family/friends to give you massage.  Please follow-up if your symptoms are not improving or worsening in the next 2-3 weeks.  If you start having difficulty swallowing any foods or drinks throughout the day, stop breathing at night, or have difficulty breathing, you should be seen earlier than that time.

## 2018-09-29 NOTE — Telephone Encounter (Signed)
Pt was in the office for an appt, informed her of new rx sent in. Deseree Kennon Holter, CMA

## 2018-10-04 ENCOUNTER — Telehealth: Payer: Self-pay | Admitting: Licensed Clinical Social Worker

## 2018-10-04 NOTE — Telephone Encounter (Addendum)
2nd unsuccessful telephone outreach attempt to [name] . LCW will reach out to [name] again over the next 2/3 days. If we are unable to reach tMs. Sherry Anderson by phone on the 3rd attempt, will discontinue outreach calls but will be available at any time to provide services to [name].  Casimer Lanius, [facility]   [phone] 9:40 AM

## 2018-10-05 IMAGING — XA Imaging study
2 series · 2 of 2 positions shown · non-contrast
Comparison: none

CLINICAL DATA: Lumbosacral spondylosis without myelopathy. Right
low back/buttock pain radiating down the posterior right leg. Lower
lumbar facet arthrosis with anterolisthesis, spinal stenosis,
lateral recess stenosis at L4-5 and L5-S1.

[Series 1: ortho adipose · 1 of 1 slices shown (1 of 2)]
[im 1/1]
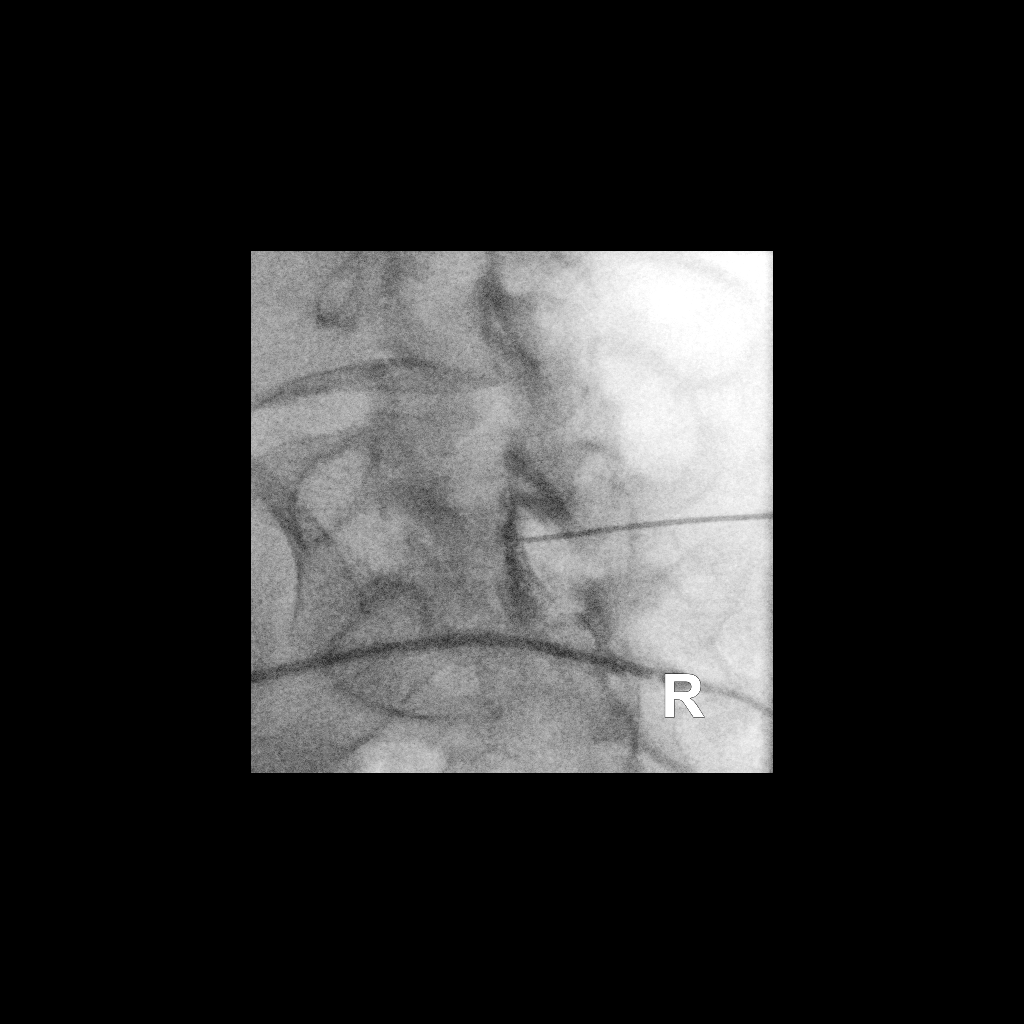

[Series 2: ortho adipose · 1 of 1 slices shown (2 of 2)]
[im 1/1]
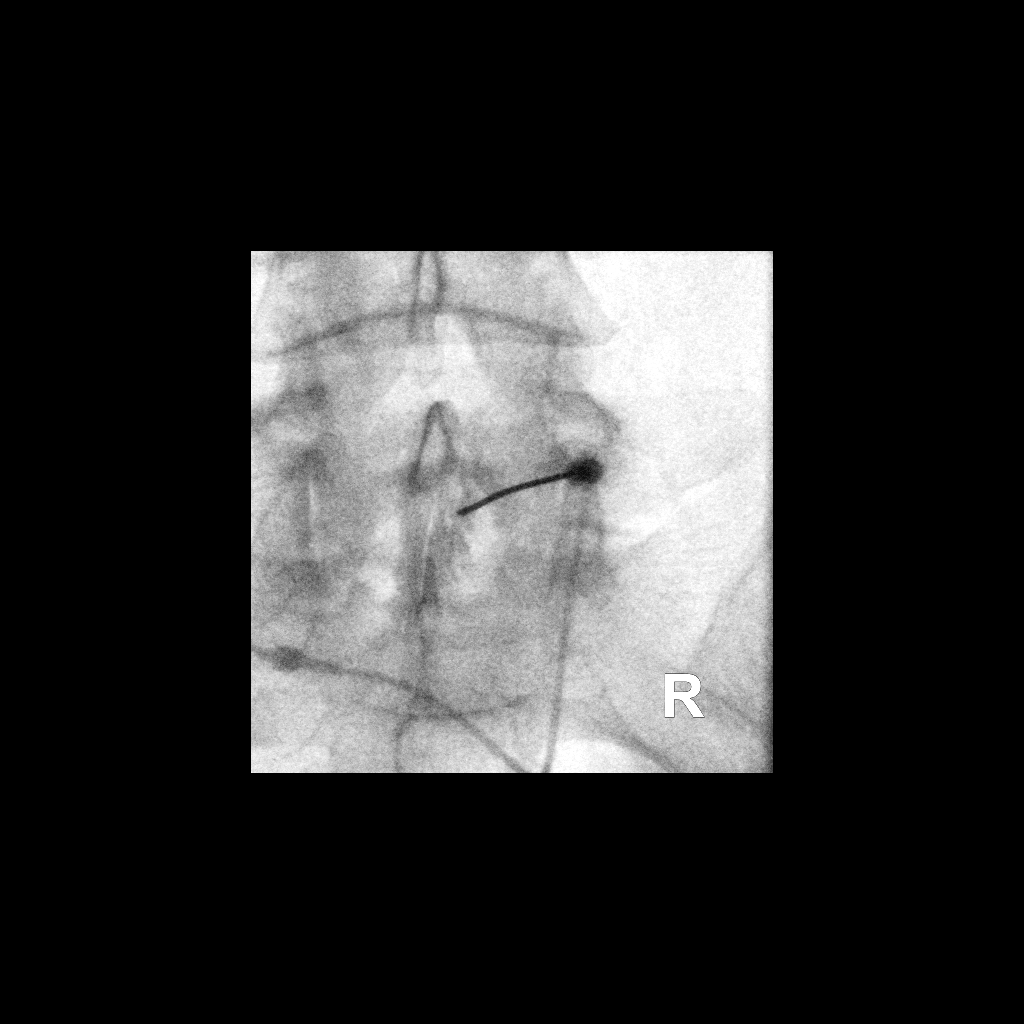

[2 of 2 positions shown; findings below may reference images not displayed]

FLUOROSCOPY TIME:  Radiation Exposure Index (as provided by the
fluoroscopic device): 10.01 microGray*m^2

Fluoroscopy Time (in minutes and seconds):  5 seconds

PROCEDURE:
The procedure, risks, benefits, and alternatives were explained to
the patient. Questions regarding the procedure were encouraged and
answered. The patient understands and consents to the procedure.

LUMBAR EPIDURAL INJECTION:

An interlaminar approach was performed on the right at L5-S1. The
overlying skin was cleansed and anesthetized. A 3.5 inch 20 gauge
epidural needle was advanced using loss-of-resistance technique.

DIAGNOSTIC EPIDURAL INJECTION:

Injection of Isovue-M 200 shows a good epidural pattern with spread
above and below the level of needle placement, primarily on the
right. No vascular opacification is seen.

THERAPEUTIC EPIDURAL INJECTION:

120 mg of Depo-Medrol mixed with 3 mL of 1% lidocaine were
instilled. The procedure was well-tolerated, and the patient was
discharged thirty minutes following the injection in good condition.

COMPLICATIONS:
None
IMPRESSION: Technically successful lumbar interlaminar epidural injection on the
right at L5-S1.

## 2018-10-06 NOTE — Telephone Encounter (Signed)
  10/06/2018 Name: Sherry Anderson MRN: [id number] DOB: [birth year]  Referred by: PCP, Dr.McIntyre to assistance patient with managing symptoms of depression.  3rd outreach call to [name] reference scheduling an appointment with LCSW. Patient reports unable to leave her parents to come in for appointment.  She also declined phone consultation. Plan:  Patient will call office to schedule appointment if needed or will call to speak with LCSW by phone. Leeanne Rio, MD has been notified of this outreach and [name]'s plan.   Casimer Lanius, [facility]   [phone] 9:56 AM

## 2018-10-07 ENCOUNTER — Encounter: Payer: Self-pay | Admitting: Family Medicine

## 2018-10-07 DIAGNOSIS — J069 Acute upper respiratory infection, unspecified: Secondary | ICD-10-CM | POA: Insufficient documentation

## 2018-10-07 NOTE — Assessment & Plan Note (Signed)
Patient likely experiencing an upper respiratory infection, with associated dry mouth at night due to sore throat and nasal congestion.  No concerns for a swallowing disorder at this time, patient without any difficulty eating or drinking. VSS and physical exam unremarkable, patient is nontoxic-appearing.  Recommended supportive care. - Provided short prescription for meloxicam to help with her additional shoulder pains that are troubling her. - Strict return precautions discussed, return to care if not improving in the next 1-2 weeks, or sooner if needed

## 2018-10-18 ENCOUNTER — Encounter (HOSPITAL_COMMUNITY): Payer: Self-pay | Admitting: Obstetrics & Gynecology

## 2018-11-02 ENCOUNTER — Telehealth: Payer: Self-pay | Admitting: Family Medicine

## 2018-11-02 DIAGNOSIS — F39 Unspecified mood [affective] disorder: Secondary | ICD-10-CM

## 2018-11-02 MED ORDER — VENLAFAXINE HCL ER 75 MG PO CP24: ORAL_CAPSULE | ORAL | 0 refills | Status: DC

## 2018-11-02 MED ORDER — OMEPRAZOLE 20 MG PO CPDR: 20.0000 mg | DELAYED_RELEASE_CAPSULE | Freq: Two times a day (BID) | ORAL | 1 refills | Status: DC

## 2018-11-02 MED ORDER — LISINOPRIL 20 MG PO TABS: 20.0000 mg | ORAL_TABLET | Freq: Every day | ORAL | 1 refills | Status: DC

## 2018-11-02 MED ORDER — LEVOTHYROXINE SODIUM 75 MCG PO TABS: 75.0000 ug | ORAL_TABLET | Freq: Every day | ORAL | 0 refills | Status: DC

## 2018-11-02 MED ORDER — TRIAMTERENE-HCTZ 37.5-25 MG PO CAPS: 1.0000 | ORAL_CAPSULE | Freq: Every day | ORAL | 1 refills | Status: DC

## 2018-11-02 NOTE — Telephone Encounter (Signed)
Called patient to discuss possibility of postponing a non-urgent appointment due to [place] pandemic, or to see if there are issues that can be addressed over the phone rather than in-person.   Patient stated she was doing well and has no immediate concerns. She is fine with postponing the appointment to a later date and will call in a month or so for an appointment.  She does request refills of her medications. I have sent these in to her pharmacy.  Encouraged patient to stay home as much as possible to avoid exposure to COVID. Educated on emphasis of telehealth right now to minimize risk of disease transmission. Advised she call us with any concerns or needs.   Leeanne Rio, MD

## 2018-11-06 ENCOUNTER — Ambulatory Visit: Payer: Self-pay | Admitting: Family Medicine

## 2018-12-25 ENCOUNTER — Telehealth: Payer: Self-pay | Admitting: *Deleted

## 2018-12-25 ENCOUNTER — Other Ambulatory Visit: Payer: Self-pay

## 2018-12-25 ENCOUNTER — Telehealth (INDEPENDENT_AMBULATORY_CARE_PROVIDER_SITE_OTHER): Payer: Self-pay | Admitting: Family Medicine

## 2018-12-25 ENCOUNTER — Telehealth: Payer: Self-pay | Admitting: Family Medicine

## 2018-12-25 DIAGNOSIS — E039 Hypothyroidism, unspecified: Secondary | ICD-10-CM

## 2018-12-25 DIAGNOSIS — F39 Unspecified mood [affective] disorder: Secondary | ICD-10-CM

## 2018-12-25 DIAGNOSIS — E038 Other specified hypothyroidism: Secondary | ICD-10-CM

## 2018-12-25 MED ORDER — BUSPIRONE HCL 5 MG PO TABS: 5.0000 mg | ORAL_TABLET | Freq: Three times a day (TID) | ORAL | 1 refills | Status: DC

## 2018-12-25 NOTE — Telephone Encounter (Signed)
Confirmed with PCP that she was able to reach pt.April Zimmerman Rumple, CMA

## 2018-12-25 NOTE — Telephone Encounter (Signed)
Pt LVM on nurse line returning your call.

## 2018-12-25 NOTE — Assessment & Plan Note (Signed)
Remains depressed and anxious. Ideally patient would be best served by getting in with psychiatry. Gave patient phone # for outpatient behavioral health clinic, as well as our [place]. Will route message to CSW to follow up with patient by phone. Add buspar 5mg  three times daily.  Virtual visit scheduled with me in 2.5 weeks to recheck anxiety/depression with addition of buspar.

## 2018-12-25 NOTE — Progress Notes (Signed)
[facility] Tele[facility] Visit  Patient consented to have virtual visit. Method of visit: Video was attempted, but technology challenges prevented patient from using video, so visit was conducted via telephone.  Encounter participants: Patient: Stanislawa Gaffin - located at home Provider: Chrisandra Netters - located at office Others (if applicable): n/a  Chief Complaint: f/u depression & hypothyroidism.  HPI:  Depression - taking venlafaxine 225mg  daily. Still feeling quite down. Also feels anxious. No SI/HI. At last visit we got her set up with CSW for telepsych consultation, but patient was not able to follow up by phone. She now has more time on her hands as other family members are around to help care for her elderly parents. She thinks she could participate better in a psychiatry program. Does not have any insurance and is applying for medicaid. Evidently they may need info from me regarding her medical needs to support medicaid - they may be reaching out to me.   Hypothyroidism - taking levothyroxine 52mcg daily. Last dose adjustment was in February due to elevated TSH and has not been rechecked since. Feels very tired, has low energy. No fevers, cough, or shortness of breath.   ROS: per HPI  Pertinent PMHx: history of subclinical hypothyroidism, hyperlipidemia, breast ca in situ, GERD, chronic fatigue  Exam:  Respiratory: Patient speaking normally in full sentences throughout the phone encounter, without any respiratory distress evident.   Assessment/Plan:  Subclinical hypothyroidism Due for recheck of TSH. Also with complaints of fatigue. Will come in tomorrow AM for TSH check lab visit.  Mood disorder ([place]) Remains depressed and anxious. Ideally patient would be best served by getting in with psychiatry. Gave patient phone # for outpatient behavioral health clinic, as well as our [place]. Will route message to CSW to follow up with patient  by phone. Add buspar 5mg  three times daily.  Virtual visit scheduled with me in 2.5 weeks to recheck anxiety/depression with addition of buspar.    Time spent during visit with patient: 18 minutes

## 2018-12-25 NOTE — Telephone Encounter (Signed)
LVM to call office to do pre-call to video visit. If pt calls back please put her through to Haydyn Liddell. Toleen Lachapelle Zimmerman Rumple, CMA

## 2018-12-25 NOTE — Assessment & Plan Note (Signed)
Due for recheck of TSH. Also with complaints of fatigue. Will come in tomorrow AM for TSH check lab visit.

## 2018-12-26 ENCOUNTER — Other Ambulatory Visit: Payer: Self-pay

## 2018-12-26 DIAGNOSIS — E038 Other specified hypothyroidism: Secondary | ICD-10-CM

## 2018-12-26 DIAGNOSIS — E039 Hypothyroidism, unspecified: Secondary | ICD-10-CM

## 2018-12-27 LAB — TSH: TSH: 2.43 u[IU]/mL (ref 0.450–4.500)

## 2018-12-28 ENCOUNTER — Encounter: Payer: Self-pay | Admitting: Family Medicine

## 2019-01-12 ENCOUNTER — Telehealth (INDEPENDENT_AMBULATORY_CARE_PROVIDER_SITE_OTHER): Payer: Self-pay | Admitting: Family Medicine

## 2019-01-12 ENCOUNTER — Other Ambulatory Visit: Payer: Self-pay

## 2019-01-12 DIAGNOSIS — F39 Unspecified mood [affective] disorder: Secondary | ICD-10-CM

## 2019-01-12 DIAGNOSIS — K089 Disorder of teeth and supporting structures, unspecified: Secondary | ICD-10-CM

## 2019-01-12 NOTE — Progress Notes (Signed)
[place] Telemedicine Visit  Patient consented to have virtual visit. Method of visit: Video was attempted, but technology challenges prevented patient from using video, so visit was conducted via telephone.  Encounter participants: Patient: Sherry Anderson - located at home Provider: Chrisandra Netters - located at office Others (if applicable): n/a  Chief Complaint: follow up   HPI:  Boil - Last week had big pimple on L leg. Was like a big hard boil. Was painful only with touch. She massaged it and thinks it is going away but still feels hardness. No redness now. It never drained. No fevers. It is getting less sore now.  Dental referral - requests referral for dentist. Has a tooth she needs worked on. Has orange card.  Depression/anxiety - last visit added buspar 5mg  three times daily. Also taking venlafaxine XR 225mg  daily. No SI/HI. Feels better with addition of buspar.  ROS: per HPI  Pertinent PMHx: mood disorder, subclinical hypothyroidism, hyperlipidemia, breast ca in situ  Exam:  Respiratory: Patient speaking normally in full sentences throughout the encounter, without any respiratory distress evident.   Assessment/Plan:  Mood disorder ([place]) Improved with addition of buspar. Will uptitrate To do 10mg  in AM and 5mg  in afternoon and HS for several days, then gradually increase to 10mg  in afternoon, then after several days to 10mg  TID  Follow up in 1 month to evaluate for improvement   Abscess on leg - sounds like it is improving on its own. No systemic symptoms now and no longer painful and less red/swollen. Discussed treatment measures if it recurs including warm compresses. Patient will notify me if worsening.  Dental referral placed, advised on likely long wait list with orange card.  Follow up in 1 month via virtual visit, scheduled  Time spent during visit with patient: 9 minutes

## 2019-01-12 NOTE — Assessment & Plan Note (Signed)
Improved with addition of buspar. Will uptitrate To do 10mg  in AM and 5mg  in afternoon and HS for several days, then gradually increase to 10mg  in afternoon, then after several days to 10mg  TID  Follow up in 1 month to evaluate for improvement

## 2019-01-16 ENCOUNTER — Other Ambulatory Visit: Payer: Self-pay | Admitting: Family Medicine

## 2019-01-28 ENCOUNTER — Other Ambulatory Visit: Payer: Self-pay | Admitting: Family Medicine

## 2019-01-28 DIAGNOSIS — F39 Unspecified mood [affective] disorder: Secondary | ICD-10-CM

## 2019-02-12 ENCOUNTER — Other Ambulatory Visit: Payer: Self-pay

## 2019-02-12 ENCOUNTER — Telehealth: Payer: Self-pay | Admitting: Family Medicine

## 2019-02-12 NOTE — Progress Notes (Signed)
Patient had virtual visit scheduled at 3:30p Called patient x2 at 3:42pm. No answer. LVM offering for her to call back.  Leeanne Rio, MD

## 2019-02-26 ENCOUNTER — Other Ambulatory Visit: Payer: Self-pay | Admitting: Family Medicine

## 2019-04-13 ENCOUNTER — Other Ambulatory Visit: Payer: Self-pay | Admitting: Family Medicine

## 2019-04-17 MED ORDER — LISINOPRIL 20 MG PO TABS: 20.0000 mg | ORAL_TABLET | Freq: Every day | ORAL | 0 refills | Status: DC

## 2019-04-17 MED ORDER — LEVOTHYROXINE SODIUM 75 MCG PO TABS: 75.0000 ug | ORAL_TABLET | Freq: Every day | ORAL | 0 refills | Status: DC

## 2019-04-17 MED ORDER — BUSPIRONE HCL 5 MG PO TABS: 5.0000 mg | ORAL_TABLET | Freq: Three times a day (TID) | ORAL | 0 refills | Status: DC

## 2019-04-17 NOTE — Telephone Encounter (Signed)
Please have patient schedule follow up with me Thanks Leeanne Rio, MD

## 2019-05-04 ENCOUNTER — Other Ambulatory Visit: Payer: Self-pay

## 2019-05-04 ENCOUNTER — Telehealth: Payer: Self-pay | Admitting: Family Medicine

## 2019-05-04 NOTE — Progress Notes (Signed)
Called patient x2 for her appointment, no answer either time. LVM offering for her to call back and reschedule.  Leeanne Rio, MD

## 2019-05-15 ENCOUNTER — Encounter: Payer: Self-pay | Admitting: Gynecology

## 2019-08-04 ENCOUNTER — Other Ambulatory Visit: Payer: Self-pay | Admitting: Family Medicine

## 2019-08-08 NOTE — Telephone Encounter (Signed)
Please ask patient to schedule follow up with me, thanks! Brittany J McIntyre, MD  

## 2019-08-12 ENCOUNTER — Other Ambulatory Visit: Payer: Self-pay | Admitting: Family Medicine

## 2019-08-13 NOTE — Telephone Encounter (Signed)
Called and LVM for patient to call office and make appointment with Dr. Ardelia Mems.  Sherry Anderson, Sherry Anderson

## 2019-08-17 DIAGNOSIS — I671 Cerebral aneurysm, nonruptured: Secondary | ICD-10-CM

## 2019-08-17 DIAGNOSIS — I82409 Acute embolism and thrombosis of unspecified deep veins of unspecified lower extremity: Secondary | ICD-10-CM

## 2019-08-17 HISTORY — DX: Cerebral aneurysm, nonruptured: I67.1

## 2019-08-17 HISTORY — DX: Acute embolism and thrombosis of unspecified deep veins of unspecified lower extremity: I82.409

## 2019-08-23 ENCOUNTER — Ambulatory Visit: Payer: Medicaid Other | Admitting: Family Medicine

## 2019-08-23 ENCOUNTER — Other Ambulatory Visit: Payer: Self-pay

## 2019-08-23 VITALS — BP 118/80 | HR 88 | Wt 161.4 lb

## 2019-08-23 DIAGNOSIS — E785 Hyperlipidemia, unspecified: Secondary | ICD-10-CM

## 2019-08-23 DIAGNOSIS — E039 Hypothyroidism, unspecified: Secondary | ICD-10-CM

## 2019-08-23 DIAGNOSIS — F39 Unspecified mood [affective] disorder: Secondary | ICD-10-CM

## 2019-08-23 DIAGNOSIS — Z23 Encounter for immunization: Secondary | ICD-10-CM

## 2019-08-23 DIAGNOSIS — E038 Other specified hypothyroidism: Secondary | ICD-10-CM

## 2019-08-23 DIAGNOSIS — I10 Essential (primary) hypertension: Secondary | ICD-10-CM

## 2019-08-23 NOTE — Progress Notes (Signed)
Date of Visit: 08/23/2019   HPI:  Donja presents today for follow up and medication refill.  Grief - mom recently died about a month ago. Prior to that her father died in the summer. Grieving heavily. Also had brother in law die from [place] in the last couple of months. Taking effexor XR 225mg  daily. Denies SI/HI. Is not interested in counseling currently.  Hypertension - currently taking lisinopril 20mg  daily and triamterene-HCTZ 37.5-25mg  daily.  Hypothyroidism - taking levothyroxine 63mcg daily  PHYSICAL EXAM: BP 118/80   Pulse 88   Wt 161 lb 6.4 oz (73.2 kg)   LMP  (LMP Unknown)   SpO2 98%   BMI 28.59 kg/m  Gen: no acute distress, pleasant, cooperative. Tearful throughout visit HEENT: normocephalic, atraumatic  Heart: regular rate and rhythm  Lungs: clear to auscultation bilaterally, normal work of breathing  Neuro: alert, speech normal, grossly nonfocal Ext: No appreciable lower extremity edema bilaterally Psych: normal range of affect though primarily tearful, well groomed, speech normal in rate and volume, normal eye contact   ASSESSMENT/PLAN:  Health maintenance:  -address mammogram with patient at follow up  -flu shot given today  Hyperlipidemia Update lipids & CMET today  HYPERTENSION, BENIGN ESSENTIAL Well controlled. Continue current regimen.   Subclinical hypothyroidism Update TSH today  Mood disorder ([place]) Preexisting depression complicated by acute grief. No SI/HI. On max dose effexor. Offered counseling but patient declines this at present. Encouraged her to consider it. Follow up in 1 month to touch base, sooner if needed.  FOLLOW UP: Follow up in 1 mo for grief  [place] J. Ardelia Mems, [place]

## 2019-08-23 NOTE — Patient Instructions (Signed)
Good to see you today. Checking labs today. Follow up with me in 1 month Let me know if you decide you'd like to talk with a counselor

## 2019-08-24 LAB — LIPID PANEL
Chol/HDL Ratio: 4.1 ratio (ref 0.0–4.4)
Cholesterol, Total: 266 mg/dL — ABNORMAL HIGH (ref 100–199)
HDL: 65 mg/dL (ref >39)
LDL Chol Calc (NIH): 168 mg/dL — ABNORMAL HIGH (ref 0–99)
Triglycerides: 181 mg/dL — ABNORMAL HIGH (ref 0–149)
VLDL Cholesterol Cal: 33 mg/dL (ref 5–40)

## 2019-08-24 LAB — CMP14+EGFR
ALT: 18 IU/L (ref 0–32)
AST: 20 IU/L (ref 0–40)
Albumin/Globulin Ratio: 1.4 (ref 1.2–2.2)
Albumin: 4.6 g/dL (ref 3.8–4.9)
Alkaline Phosphatase: 74 IU/L (ref 39–117)
BUN/Creatinine Ratio: 14 (ref 9–23)
BUN: 13 mg/dL (ref 6–24)
Bilirubin Total: 0.3 mg/dL (ref 0.0–1.2)
CO2: 23 mmol/L (ref 20–29)
Calcium: 9.5 mg/dL (ref 8.7–10.2)
Chloride: 100 mmol/L (ref 96–106)
Creatinine, Ser: 0.91 mg/dL (ref 0.57–1.00)
GFR calc Af Amer: 80 mL/min/{1.73_m2} (ref >59)
GFR calc non Af Amer: 69 mL/min/{1.73_m2} (ref >59)
Globulin, Total: 3.2 g/dL (ref 1.5–4.5)
Glucose: 103 mg/dL — ABNORMAL HIGH (ref 65–99)
Potassium: 4 mmol/L (ref 3.5–5.2)
Sodium: 139 mmol/L (ref 134–144)
Total Protein: 7.8 g/dL (ref 6.0–8.5)

## 2019-08-24 LAB — TSH: TSH: 2.77 u[IU]/mL (ref 0.450–4.500)

## 2019-08-24 NOTE — Assessment & Plan Note (Signed)
Update lipids & CMET today

## 2019-08-24 NOTE — Assessment & Plan Note (Signed)
Preexisting depression complicated by acute grief. No SI/HI. On max dose effexor. Offered counseling but patient declines this at present. Encouraged her to consider it. Follow up in 1 month to touch base, sooner if needed.

## 2019-08-24 NOTE — Assessment & Plan Note (Signed)
Well-controlled.  Continue current regimen. 

## 2019-08-24 NOTE — Assessment & Plan Note (Signed)
Update TSH today. 

## 2019-08-26 ENCOUNTER — Other Ambulatory Visit: Payer: Self-pay | Admitting: Family Medicine

## 2019-08-26 DIAGNOSIS — F39 Unspecified mood [affective] disorder: Secondary | ICD-10-CM

## 2019-08-27 ENCOUNTER — Encounter: Payer: Self-pay | Admitting: Family Medicine

## 2019-09-04 ENCOUNTER — Encounter: Payer: Self-pay | Admitting: Family Medicine

## 2019-09-04 ENCOUNTER — Ambulatory Visit (INDEPENDENT_AMBULATORY_CARE_PROVIDER_SITE_OTHER): Payer: Medicaid Other | Admitting: Family Medicine

## 2019-09-04 ENCOUNTER — Other Ambulatory Visit: Payer: Self-pay

## 2019-09-04 VITALS — BP 118/72 | HR 86 | Wt 166.4 lb

## 2019-09-04 DIAGNOSIS — H9201 Otalgia, right ear: Secondary | ICD-10-CM | POA: Diagnosis not present

## 2019-09-04 NOTE — Progress Notes (Signed)
  Patient Name: Sherry Anderson Date of Birth: 01-19-1961 Date of Visit: 09/04/19 PCP: Leeanne Rio, MD  Chief Complaint:   Subjective: Sherry Anderson is a pleasant 59 y.o. with medical history significant for chronic right ear pain and hearing loss presenting today for right jaw pain and ear pain that has worsened over the last 2-3 years. She was last seen at the dentist a few days ago and do not have any cavities and dental xrays were normal. In other words the dentist did not feel as if the discomfort was due to her oral health. The pain is intermittent and occurs when eating, talking, and lying on that side of her face. She will occassionally have sharp pain in her ear, teeth, and along her jaw line both upper and lower. Her eye waters as well. She does endorse tinnitits with these episodes but does not have headache, blurry vision, parathesias, dizziness, or fever. At this time she would like a referral to ENT. She has seen ENT several years ago and does not remember what came of those visits.   ROS: Per HPI.   I have reviewed the patient's medical, surgical, family, and social history as appropriate.  Vitals:   09/04/19 1553  BP: 118/72  Pulse: 86  SpO2: 99%   General: Appears well, no acute distress. Age appropriate. HEENT: Normocephalic. No lymphadenopathy. No broken or misshapen teeth. Non-tender pre/post auricular areas on the right. Cardiac: RRR, normal heart sounds, no murmurs Respiratory: CTAB, normal effort Skin: Warm and dry, no rashes noted Neuro: alert and oriented, no focal deficits. Right facial sensation and hearing grossly intact. Right sided facial pain not reproducible with tappin in trigeminal ganglion region. Psych: normal affect  Right ear pain Chronic. Worsening over 2-3 years. Patient has right facial pain, ear pain, and eye tearing. Etiology more likely of neurological origin with high suspicion of trigeminal neuralgia due to the affected nerve  distribution (V1-3) and triggered sensations. Less likely to be TMJ having dental work up and no jaw muscle pain, no audible clinking or misalignment. I would not refer to ENT at this time. Neurology may be more beneficial. Less likely to be related to MS, neuromas, meningiomas, or cerebral aneurysms as brain MRI 12/2017 was normal but could consider repeat imaging. -Referral to Neurology for suspected trigeminal neuralgia -Consider repeating MRI -Follow up as needed. If any loss of sensation/assymetry of the face and/or loss of vision seek care immediately.   Gerlene Fee, [facility] PGY-1

## 2019-09-04 NOTE — Patient Instructions (Addendum)
It was very nice to meet you today. Please enjoy the rest of your week. Today you were seen for right sided facial pain. You were referred to neurology. Follow up as needed. If any loss in vision or facial numbness report to your nearest ED.   Please call the clinic at [phone] if your symptoms worsen or you have any concerns. It was our pleasure to serve you.   Trigeminal Neuralgia  Trigeminal neuralgia is a nerve disorder that causes severe pain on one side of the face. The pain may last from a few seconds to several minutes. The pain is usually only on one side of the face. Symptoms may occur for days, weeks, or months and then go away for months or years. The pain may return and be worse than before. What are the causes? This condition is caused by damage or pressure to a nerve in the head that is called the trigeminal nerve. An attack can be triggered by:  Talking.  Chewing.  Putting on makeup.  Washing your face.  Shaving your face.  Brushing your teeth.  Touching your face. What increases the risk? You are more likely to develop this condition if you:  Are 38 years of age or older.  Are female. What are the signs or symptoms? The main symptom of this condition is severe pain in the:  Jaw.  Lips.  Eyes.  Nose.  Scalp.  Forehead.  Face. The pain may be:  Intense.  Stabbing.  Electric.  Shock-like. How is this diagnosed? This condition is diagnosed with a physical exam. A CT scan or an MRI may be done to rule out other conditions that can cause facial pain. How is this treated? This condition may be treated with:  Avoiding the things that trigger your symptoms.  Taking prescription medicines (anticonvulsants).  Having surgery. This may be done in severe cases if other medical treatment does not provide relief.  Having procedures such as ablation, thermal, or radiation therapy. It may take up to one month for treatment to start relieving the  pain. Follow these instructions at home: Managing pain  Learn as much as you can about how to manage your pain. Ask your health care provider if a pain specialist would be helpful.  Consider talking with a mental health care provider (psychologist) about how to cope with the pain.  Consider joining a pain support group. General instructions  Take over-the-counter and prescription medicines only as told by your health care provider.  Avoid the things that trigger your symptoms. It may help to: ? Chew on the unaffected side of your mouth. ? Avoid touching your face. ? Avoid blasts of hot or cold air.  Follow your treatment plan as told by your health care provider. This may include: ? Cognitive or behavioral therapy. ? Gentle, regular exercise. ? Meditation or yoga. ? Aromatherapy.  Keep all follow-up visits as told by your health care provider. You may need to be monitored closely to make sure treatment is working well for you. Where to find more information  Facial Pain Association: fpa-support.org Contact a health care provider if:  Your medicine is not helping your symptoms.  You have side effects from the medicine used for treatment.  You develop new, unexplained symptoms, such as: ? Double vision. ? Facial weakness. ? Facial numbness. ? Changes in hearing or balance.  You feel depressed. Get help right away if:  Your pain is severe and is not getting better.  You  develop suicidal thoughts. If you ever feel like you may hurt yourself or others, or have thoughts about taking your own life, get help right away. You can go to your nearest emergency department or call:  Your local emergency services (911 in the [place]  A suicide crisis helpline, such as the [place] at (801)080-6209. This is open 24 hours a day. Summary  Trigeminal neuralgia is a nerve disorder that causes severe pain on one side of the face. The pain may last from a  few seconds to several minutes.  This condition is caused by damage or pressure to a nerve in the head that is called the trigeminal nerve.  Treatment may include avoiding the things that trigger your symptoms, taking medicines, or having surgery or procedures. It may take up to one month for treatment to start relieving the pain.  Avoid the things that trigger your symptoms.  Keep all follow-up visits as told by your health care provider. You may need to be monitored closely to make sure treatment is working well for you. This information is not intended to replace advice given to you by your health care provider. Make sure you discuss any questions you have with your health care provider. Document Revised: 06/19/2018 Document Reviewed: 06/19/2018 Elsevier Patient Education  [place].

## 2019-09-05 ENCOUNTER — Encounter: Payer: Self-pay | Admitting: Neurology

## 2019-09-09 ENCOUNTER — Other Ambulatory Visit: Payer: Self-pay | Admitting: Family Medicine

## 2019-09-10 NOTE — Assessment & Plan Note (Addendum)
Chronic. Worsening over 2-3 years. Patient has right facial pain, ear pain, and eye tearing. Etiology more likely of neurological origin with high suspicion of trigeminal neuralgia due to the affected nerve distribution (V1-3) and triggered sensations. Less likely to be TMJ having dental work up and no jaw muscle pain, no audible clinking or misalignment. I would not refer to ENT at this time. Neurology may be more beneficial. Less likely to be related to MS, neuromas, meningiomas, or cerebral aneurysms as brain MRI 12/2017 was normal but could consider repeat imaging. -Referral to Neurology for suspected trigeminal neuralgia -Consider repeating MRI -Follow up as needed. If any loss of sensation/assymetry of the face and/or loss of vision seek care immediately.

## 2019-09-11 ENCOUNTER — Emergency Department (HOSPITAL_COMMUNITY)
Admission: EM | Admit: 2019-09-11 | Discharge: 2019-09-11 | Disposition: A | Discharge status: Home / Self Care | Payer: Medicaid Other | Attending: Emergency Medicine | Admitting: Emergency Medicine

## 2019-09-11 ENCOUNTER — Encounter (HOSPITAL_COMMUNITY): Payer: Self-pay | Admitting: Emergency Medicine

## 2019-09-11 ENCOUNTER — Other Ambulatory Visit: Payer: Self-pay

## 2019-09-11 DIAGNOSIS — Z853 Personal history of malignant neoplasm of breast: Secondary | ICD-10-CM | POA: Insufficient documentation

## 2019-09-11 DIAGNOSIS — D069 Carcinoma in situ of cervix, unspecified: Secondary | ICD-10-CM | POA: Diagnosis not present

## 2019-09-11 DIAGNOSIS — E039 Hypothyroidism, unspecified: Secondary | ICD-10-CM | POA: Diagnosis not present

## 2019-09-11 DIAGNOSIS — R6884 Jaw pain: Secondary | ICD-10-CM | POA: Diagnosis not present

## 2019-09-11 DIAGNOSIS — Z79899 Other long term (current) drug therapy: Secondary | ICD-10-CM | POA: Insufficient documentation

## 2019-09-11 DIAGNOSIS — H9201 Otalgia, right ear: Secondary | ICD-10-CM | POA: Diagnosis not present

## 2019-09-11 LAB — CBC WITH DIFFERENTIAL/PLATELET
Abs Immature Granulocytes: 0.01 10*3/uL (ref 0.00–0.07)
Basophils Absolute: 0 10*3/uL (ref 0.0–0.1)
Basophils Relative: 1 %
Eosinophils Absolute: 0.2 10*3/uL (ref 0.0–0.5)
Eosinophils Relative: 4 %
HCT: 40.6 % (ref 36.0–46.0)
Hemoglobin: 13.4 g/dL (ref 12.0–15.0)
Immature Granulocytes: 0 %
Lymphocytes Relative: 48 %
Lymphs Abs: 2.9 10*3/uL (ref 0.7–4.0)
MCH: 29.1 pg (ref 26.0–34.0)
MCHC: 33 g/dL (ref 30.0–36.0)
MCV: 88.3 fL (ref 80.0–100.0)
Monocytes Absolute: 0.5 10*3/uL (ref 0.1–1.0)
Monocytes Relative: 8 %
Neutro Abs: 2.4 10*3/uL (ref 1.7–7.7)
Neutrophils Relative %: 39 %
Platelets: 357 10*3/uL (ref 150–400)
RBC: 4.6 MIL/uL (ref 3.87–5.11)
RDW: 12.8 % (ref 11.5–15.5)
WBC: 6.1 10*3/uL (ref 4.0–10.5)
nRBC: 0 % (ref 0.0–0.2)

## 2019-09-11 LAB — BASIC METABOLIC PANEL
Anion gap: 11 (ref 5–15)
BUN: 21 mg/dL — ABNORMAL HIGH (ref 6–20)
CO2: 26 mmol/L (ref 22–32)
Calcium: 9.6 mg/dL (ref 8.9–10.3)
Chloride: 101 mmol/L (ref 98–111)
Creatinine, Ser: 0.87 mg/dL (ref 0.44–1.00)
GFR calc Af Amer: >60 mL/min (ref >60)
GFR calc non Af Amer: >60 mL/min (ref >60)
Glucose, Bld: 103 mg/dL — ABNORMAL HIGH (ref 70–99)
Potassium: 3.5 mmol/L (ref 3.5–5.1)
Sodium: 138 mmol/L (ref 135–145)

## 2019-09-11 MED ORDER — NAPROXEN 500 MG PO TABS: 500.0000 mg | ORAL_TABLET | Freq: Two times a day (BID) | ORAL | 0 refills | Status: DC

## 2019-09-11 MED ORDER — METHOCARBAMOL 500 MG PO TABS: 500.0000 mg | ORAL_TABLET | Freq: Three times a day (TID) | ORAL | 0 refills | Status: DC | PRN

## 2019-09-11 NOTE — ED Triage Notes (Addendum)
Pt reports 1-2 months of right jaw pain. Denies recent fevers. Seen by dentist 1 week ago and told not a dental issue. VSS. Tearful in triage.

## 2019-09-11 NOTE — ED Provider Notes (Signed)
[facility] Provider Note   CSN: [id number] Arrival date & time: 09/11/19  1359     History Chief Complaint  Patient presents with  . Jaw Pain    Sherry Anderson is a 59 y.o. female with a history of anxiety, depression, hypertension, breast cancer in 2002 s/p lumpectomy without recurrence, hypothyroidism, & hyperlipidemia who presents to the ED with complaints of jaw pain that acutely worsened this AM and is improved @ present. Patient states she has had issues with ear pain/jaw pain for a few years now, however this has become more prominent over the past 1-2 months. Pain is primarily to the R jaw, radiates to the ear, occurs intermittently, episodes are daily, worse with chewing, talking, & certain positions, no alleviating factors. Seen by PCP who had concern for trigeminal neuralgia- referred to neurology who has called her to establish appointment but she has not been able to call them back yet.  She has seen a dentist recently who did not feel that there was a dental origin to her discomfort.  This morning the pain was really bad which prompted ER visit, this has since resolved.  She denies fever, chills, intraoral drainage, intraoral swelling, dysphagia, change in voice, numbness, tingling, visual disturbance, weakness, chest pain, or dyspnea.  Denies ear drainage or nasal congestion.  HPI     Past Medical History:  Diagnosis Date  . Adenocarcinoma in situ (AIS) of uterine cervix 05/2017   Associated with high-grade dysplasia  . Anxiety and depression   . Cancer [facility]) 2002   left breast cancer   . Depression   . Diverticulosis of colon   . Essential hypertension, benign   . GERD (gastroesophageal reflux disease)   . Hepatitis    unsure of which type  . High grade squamous intraepithelial cervical dysplasia 05/2017   Associated with adenocarcinoma in situ  . History of adenomatous polyp of colon    tubular adenoma's  02-08-2017  .  History of gastritis   . History of left breast cancer 2002---- per pt no recurrence   dx DCIS --- s/p  left breast lumpectomy;  per pt radiation therapy completed same year and completed antiestrogen therapy   . History of radiation therapy    2002   left breast  . Hypothyroidism   . Spinal stenosis   . Spondylolisthesis   . SVD (spontaneous vaginal delivery)    x 1    Patient Active Problem List   Diagnosis Date Noted  . Viral upper respiratory illness 10/07/2018  . GERD (gastroesophageal reflux disease) 09/28/2018  . Dyspnea 09/28/2018  . Dental caries 06/21/2018  . Radiculopathy 04/13/2018  . Subclinical hypothyroidism 11/18/2017  . Atypical cervical glandular cells 04/21/2017  . Healthcare maintenance 04/04/2017  . Right-sided face pain 03/02/2017  . Tremor of right hand 11/07/2015  . Right-sided sensorineural hearing loss 11/13/2014  . DDD (degenerative disc disease), lumbar 10/21/2014  . Knee pain, bilateral 11/06/2013  . Left wrist pain 03/02/2013  . Exertional angina (HCC) 03/30/2012  . Anemia 01/25/2012  . Right ear pain 01/19/2012  . HEADACHE 02/04/2010  . Hyperlipidemia 08/07/2009  . HEARING LOSS, RIGHT EAR 01/17/2008  . CARPAL TUNNEL SYNDROME, BILATERAL 07/31/2007  . LATERAL EPICONDYLITIS, BILATERAL 07/18/2007  . Fatigue due to depression 04/06/2007  . Gastritis and gastroduodenitis 11/21/2006  . Mood disorder ([place]) 10/17/2006  . HYPERTENSION, BENIGN ESSENTIAL 10/17/2006  . CA IN SITU, BREAST 02/15/2001    Past Surgical History:  Procedure Laterality Date  .  BREAST LUMPECTOMY Left 2002   ductal CA in situ  . CERVICAL CONIZATION W/BX N/A 06/08/2017   Procedure: CONIZATION CERVIX WITH BIOPSY;  Surgeon: Anastasio Auerbach, MD;  Location: [facility];  Service: Gynecology;  Laterality: N/A;  . [place]   twins  . COLONOSCOPY  02-08-2017   dr Karleen Hampshire  . DILATATION & CURETTAGE/HYSTEROSCOPY WITH MYOSURE N/A 06/08/2017    Procedure: [place];  Surgeon: Anastasio Auerbach, MD;  Location: [facility];  Service: Gynecology;  Laterality: N/A;  . ESOPHAGOGASTRODUODENOSCOPY  last one 11-24-2016   dr Karleen Hampshire  . ROBOTIC ASSISTED TOTAL HYSTERECTOMY WITH BILATERAL SALPINGO OOPHERECTOMY N/A 09/21/2017   Procedure: XI ROBOTIC ASSISTED TOTAL HYSTERECTOMY WITH BILATERAL SALPINGO OOPHORECTOMY, Lysis of Adhesions;  Surgeon: Princess Bruins, MD;  Location: [facility];  Service: Gynecology;  Laterality: N/A;  . TOOTH EXTRACTION    . TRANSFORAMINAL LUMBAR INTERBODY FUSION (TLIF) WITH PEDICLE SCREW FIXATION 2 LEVEL N/A 04/13/2018   Procedure: RIGHT-SIDED LUMBAR 4-5 AND LUMBAR 5-SACRUM 1 TRANSFORAMINAL LUMBAR INTERBODY FUSION WITH INSTRUMENTATION AND ALLOGRAFT;  Surgeon: Phylliss Bob, MD;  Location: [facility];  Service: Orthopedics;  Laterality: N/A;  . WISDOM TOOTH EXTRACTION       OB History    Gravida  2   Para      Term      Preterm      AB      Living  3     SAB      TAB      Ectopic      Multiple  1   Live Births              Family History  Problem Relation Age of Onset  . Diabetes Mother   . Hypertension Mother   . Heart disease Father        Pacemaker  . Thyroid disease Sister   . Schizophrenia Brother   . Colon cancer Neg Hx   . Esophageal cancer Neg Hx   . Rectal cancer Neg Hx   . Stomach cancer Neg Hx   . Breast cancer Neg Hx     Social History   Tobacco Use  . Smoking status: Never Smoker  . Smokeless tobacco: Never Used  Substance Use Topics  . Alcohol use: No  . Drug use: No    Home Medications Prior to Admission medications   Medication Sig Start Date End Date Taking? Authorizing Provider  Calcium Citrate-Vitamin D (CALCIUM + D PO) Take 1 tablet by mouth 2 (two) times daily.    [provider]  Cyanocobalamin (VITAMIN B-12 PO) Take by mouth.    [provider]  levothyroxine (SYNTHROID) 75 MCG tablet  Take 1 tablet (75 mcg total) by mouth every morning. 30 minutes before food 08/08/19   Leeanne Rio, MD  lisinopril (ZESTRIL) 20 MG tablet TAKE 1 TABLET BY MOUTH EVERY DAY 08/13/19   Lind Covert, MD  meloxicam (MOBIC) 7.5 MG tablet Take 1 tablet (7.5 mg total) by mouth daily. 09/29/18   Patriciaann Clan, DO  omeprazole (PRILOSEC) 20 MG capsule TAKE 1 CAPSULE BY MOUTH TWICE A DAY 08/13/19   Chambliss, Jeb Levering, MD  triamterene-hydrochlorothiazide (DYAZIDE) 37.5-25 MG capsule TAKE 1 EACH (1 CAPSULE TOTAL) BY MOUTH DAILY. 08/13/19   Lind Covert, MD  venlafaxine XR (EFFEXOR-XR) 75 MG 24 hr capsule TAKE 3 CAPSULES BY MOUTH EVERY DAY 08/27/19   Leeanne Rio, MD  Allergies    Patient has no known allergies.  Review of Systems   Review of Systems  Constitutional: Negative for chills and fever.  HENT: Positive for ear pain. Negative for dental problem, ear discharge, sinus pressure, sinus pain, sore throat, tinnitus, trouble swallowing and voice change.        + for R jaw pain.   Eyes: Negative for visual disturbance.  Respiratory: Negative for cough and shortness of breath.   Cardiovascular: Negative for chest pain.  Gastrointestinal: Negative for nausea and vomiting.  Musculoskeletal: Negative for neck stiffness.  Neurological: Negative for dizziness, syncope, facial asymmetry, speech difficulty, weakness, light-headedness and numbness.    Physical Exam Updated Vital Signs BP 121/65 (BP Location: Right Arm)   Pulse 84   Temp 98.4 F (36.9 C) (Oral)   Resp 18   Ht 5\' 4"  (1.626 m)   Wt 72.6 kg   LMP  (LMP Unknown)   SpO2 98%   BMI 27.46 kg/m   Physical Exam Vitals and nursing note reviewed.  Constitutional:      General: She is not in acute distress.    Appearance: Normal appearance. She is well-developed. She is not toxic-appearing.  HENT:     Head: Normocephalic and atraumatic.      Right Ear: Ear canal normal. Tympanic membrane is not  perforated, erythematous, retracted or bulging.     Left Ear: Ear canal normal. Tympanic membrane is not perforated, erythematous, retracted or bulging.     Ears:     Comments: No mastoid erythema/swelling/tenderness.     Nose:     Right Sinus: No maxillary sinus tenderness or frontal sinus tenderness.     Left Sinus: No maxillary sinus tenderness or frontal sinus tenderness.     Mouth/Throat:     Pharynx: Oropharynx is clear. Uvula midline. No oropharyngeal exudate or posterior oropharyngeal erythema.     Comments: No appreciable dental tenderness or gingival swelling/tenderness.  Posterior oropharynx is symmetric appearing. Patient tolerating own secretions without difficulty. No trismus. No drooling. No hot potato voice. No swelling beneath the tongue, submandibular compartment is soft.  Eyes:     General: Vision grossly intact. Gaze aligned appropriately.        Right eye: No discharge.        Left eye: No discharge.     Extraocular Movements: Extraocular movements intact.     Conjunctiva/sclera: Conjunctivae normal.     Pupils: Pupils are equal, round, and reactive to light.     Comments: No proptosis.   Cardiovascular:     Rate and Rhythm: Normal rate and regular rhythm.     Heart sounds: No murmur.  Pulmonary:     Effort: Pulmonary effort is normal. No respiratory distress.     Breath sounds: Normal breath sounds. No wheezing, rhonchi or rales.  Abdominal:     General: There is no distension.     Palpations: Abdomen is soft.     Tenderness: There is no abdominal tenderness. There is no guarding or rebound.  Musculoskeletal:     Cervical back: Normal range of motion and neck supple. No edema or rigidity.  Lymphadenopathy:     Cervical: No cervical adenopathy.  Skin:    General: Skin is warm and dry.     Findings: No rash.  Neurological:     Mental Status: She is alert.     Comments: Alert. Clear speech. No facial droop. CNIII-XII grossly intact. Bilateral upper and lower  extremities' sensation grossly intact. 5/5  symmetric strength with grip strength and with plantar and dorsi flexion bilaterally . Normal finger to nose bilaterally. Negative pronator drift. Gait intact.    Psychiatric:        Mood and Affect: Mood normal.        Behavior: Behavior normal.     ED Results / Procedures / Treatments   Labs (all labs ordered are listed, but only abnormal results are displayed) Labs Reviewed  BASIC METABOLIC PANEL - Abnormal; Notable for the following components:      Result Value   Glucose, Bld 103 (*)    BUN 21 (*)    All other components within normal limits  CBC WITH DIFFERENTIAL/PLATELET    EKG None  Radiology No results found.  Procedures Procedures (including critical care time)  Medications Ordered in ED Medications - No data to display  ED Course  I have reviewed the triage vital signs and the nursing notes.  Pertinent labs & imaging results that were available during my care of the patient were reviewed by me and considered in my medical decision making (see chart for details).   MDM Rules/Calculators/A&P                      Patient presents to the ED for acutely worsened R jaw pain which is somewhat improved @ present. Nontoxic, resting comfortably, vitals WNL. Labs per triage reviewed & reassuring. No signs of infection today- Findings not consistent with AOM, AOE, mastoiditis, meningitis, strep, RPA/PTA, or dental abscess.  Recent dental evaluation reassuring and benign intra-oral exam today. No skin changes to suggest cellulitis or shingles. No neuro deficits. She does have some tenderness over the TMJ on my exam- possible cause, but also considering trigeminal neuralgia as discussed by PCP. Will trial naproxen/robaxin for TMJ, encouraged patient to follow up with neurology as planned by PCP. I discussed results, treatment plan, need for follow-up, and return precautions with the patient. Provided opportunity for questions, patient  confirmed understanding and is in agreement with plan.   Final Clinical Impression(s) / ED Diagnoses Final diagnoses:  Jaw pain    Rx / DC Orders ED Discharge Orders         Ordered    naproxen (NAPROSYN) 500 MG tablet  2 times daily     09/11/19 1636    methocarbamol (ROBAXIN) 500 MG tablet  Every 8 hours PRN     09/11/19 1636           Courtni Balash, Glynda Jaeger, PA-C 09/11/19 1638    Lucrezia Starch, MD 09/11/19 2203

## 2019-09-11 NOTE — Discharge Instructions (Addendum)
You were seen in the emergency department today for jaw pain.  Your labs were overall reassuring.  This pain could be due to an issue with your TMJ (jaw joint) or possibly trigeminal neuralgia as your primary care provider discussed.  We would like you to try the following medicines to help with your pain:  - Naproxen is a nonsteroidal anti-inflammatory medication that will help with pain and swelling. Be sure to take this medication as prescribed with food, 1 pill every 12 hours,  It should be taken with food, as it can cause stomach upset, and more seriously, stomach bleeding. Do not take other nonsteroidal anti-inflammatory medications with this such as Advil, Motrin, Aleve, Mobic, Goodie Powder, or Motrin.    - Robaxin is the muscle relaxer I have prescribed, this is meant to help with muscle tightness. Be aware that this medication may make you drowsy therefore the first time you take this it should be at a time you are in an environment where you can rest. Do not drive or operate heavy machinery when taking this medication. Do not drink alcohol or take other sedating medications with this medicine such as narcotics or benzodiazepines.   You make take Tylenol per over the counter dosing with these medications.   We have prescribed you new medication(s) today. Discuss the medications prescribed today with your pharmacist as they can have adverse effects and interactions with your other medicines including over the counter and prescribed medications. Seek medical evaluation if you start to experience new or abnormal symptoms after taking one of these medicines, seek care immediately if you start to experience difficulty breathing, feeling of your throat closing, facial swelling, or rash as these could be indications of a more serious allergic reaction    Please follow-up with neurology as initially discussed, we have also given you our neurology group to call to schedule an appointment and this is  more convenient.  Please follow-up within 1 week.  Return to the ER for new or worsening symptoms or any other concerns.

## 2019-09-13 ENCOUNTER — Encounter: Payer: Self-pay | Admitting: Neurology

## 2019-09-13 ENCOUNTER — Ambulatory Visit: Payer: Medicaid Other | Admitting: Neurology

## 2019-09-13 ENCOUNTER — Other Ambulatory Visit: Payer: Self-pay

## 2019-09-13 VITALS — BP 122/77 | HR 87 | Temp 96.8°F | Ht 64.0 in | Wt 164.5 lb

## 2019-09-13 DIAGNOSIS — G500 Trigeminal neuralgia: Secondary | ICD-10-CM | POA: Insufficient documentation

## 2019-09-13 MED ORDER — GABAPENTIN 300 MG PO CAPS: 300.0000 mg | ORAL_CAPSULE | Freq: Three times a day (TID) | ORAL | 11 refills | Status: DC

## 2019-09-13 NOTE — Progress Notes (Signed)
PATIENT: Sherry Anderson DOB: 01/31/1961  Chief Complaint  Patient presents with  . Pain    Reports pain in right ear that radiates into her jaw. She is having difficulty chewing and brushing her teeth.  Marland Kitchen PCP    Dickie La, MD     HISTORICAL  Sherry Anderson is a 59 year old female, seen in request by her primary care physician Dr. Dorcas Mcmurray for evaluation of right jaw pain, initial evaluation was September 13, 2019.  I have reviewed and summarized the referring note from the referring physician.  She has past medical history of hypertension, hypothyroidism, on supplement,  She had one episode of similar right jaw pain about 3 years ago, lasting 3 to 4 months gradually improved, around November 2020, she had recurrent right side jaw pain, radiating pain from right ear to right jaw, sharp transient electric shooting pain,' worse than the childbirth pain," sometimes she has difficulty eating, opening her mouth, she has tried over-the-counter naproxen without helping  She denies hearing loss, denies facial weakness, denies visual loss  I personally reviewed MRI of the brain without contrast in May 2019 that was normal.  Laboratory evaluation January 2021: CMP was normal, creatinine of 0.87, normal CBC, hemoglobin of 13.4, TSH, total cholesterol of 266, LDL of 168,  REVIEW OF SYSTEMS: Full 14 system review of systems performed and notable only for as above All other review of systems were negative.  ALLERGIES: No Known Allergies  HOME MEDICATIONS: Current Outpatient Medications  Medication Sig Dispense Refill  . Calcium Citrate-Vitamin D (CALCIUM + D PO) Take 1 tablet by mouth 2 (two) times daily.    . Cyanocobalamin (VITAMIN B-12 PO) Take 1 tablet by mouth daily.     Marland Kitchen ibuprofen (ADVIL) 200 MG tablet Take 200 mg by mouth every 6 (six) hours as needed for moderate pain.    Marland Kitchen levothyroxine (SYNTHROID) 75 MCG tablet Take 1 tablet (75 mcg total) by mouth every morning. 30  minutes before food 90 tablet 0  . lisinopril (ZESTRIL) 20 MG tablet TAKE 1 TABLET BY MOUTH EVERY DAY 90 tablet 0  . meloxicam (MOBIC) 7.5 MG tablet Take 1 tablet (7.5 mg total) by mouth daily. 15 tablet 0  . methocarbamol (ROBAXIN) 500 MG tablet Take 1 tablet (500 mg total) by mouth every 8 (eight) hours as needed for muscle spasms. 15 tablet 0  . naproxen (NAPROSYN) 500 MG tablet Take 1 tablet (500 mg total) by mouth 2 (two) times daily. 10 tablet 0  . omeprazole (PRILOSEC) 20 MG capsule TAKE 1 CAPSULE BY MOUTH TWICE A DAY 180 capsule 0  . triamterene-hydrochlorothiazide (DYAZIDE) 37.5-25 MG capsule TAKE 1 EACH (1 CAPSULE TOTAL) BY MOUTH DAILY. (Patient taking differently: Take 1 capsule by mouth daily. ) 90 capsule 0  . venlafaxine XR (EFFEXOR-XR) 75 MG 24 hr capsule TAKE 3 CAPSULES BY MOUTH EVERY DAY 90 capsule 2   No current facility-administered medications for this visit.    PAST MEDICAL HISTORY: Past Medical History:  Diagnosis Date  . Adenocarcinoma in situ (AIS) of uterine cervix 05/2017   Associated with high-grade dysplasia  . Anxiety and depression   . Cancer [facility]) 2002   left breast cancer   . Depression   . Diverticulosis of colon   . Essential hypertension, benign   . GERD (gastroesophageal reflux disease)   . Hepatitis    unsure of which type  . High grade squamous intraepithelial cervical dysplasia 05/2017   Associated with adenocarcinoma in  situ  . History of adenomatous polyp of colon    tubular adenoma's  02-08-2017  . History of gastritis   . History of left breast cancer 2002---- per pt no recurrence   dx DCIS --- s/p  left breast lumpectomy;  per pt radiation therapy completed same year and completed antiestrogen therapy   . History of radiation therapy    2002   left breast  . Hypothyroidism   . Pain    right ear and jaw  . Spinal stenosis   . Spondylolisthesis   . SVD (spontaneous vaginal delivery)    x 1    PAST SURGICAL HISTORY: Past Surgical  History:  Procedure Laterality Date  . BREAST LUMPECTOMY Left 2002   ductal CA in situ  . CERVICAL CONIZATION W/BX N/A 06/08/2017   Procedure: CONIZATION CERVIX WITH BIOPSY;  Surgeon: Anastasio Auerbach, MD;  Location: [facility];  Service: Gynecology;  Laterality: N/A;  . [place]   twins  . COLONOSCOPY  02-08-2017   dr Karleen Hampshire  . DILATATION & CURETTAGE/HYSTEROSCOPY WITH MYOSURE N/A 06/08/2017   Procedure: [place];  Surgeon: Anastasio Auerbach, MD;  Location: [facility];  Service: Gynecology;  Laterality: N/A;  . ESOPHAGOGASTRODUODENOSCOPY  last one 11-24-2016   dr Karleen Hampshire  . ROBOTIC ASSISTED TOTAL HYSTERECTOMY WITH BILATERAL SALPINGO OOPHERECTOMY N/A 09/21/2017   Procedure: XI ROBOTIC ASSISTED TOTAL HYSTERECTOMY WITH BILATERAL SALPINGO OOPHORECTOMY, Lysis of Adhesions;  Surgeon: Princess Bruins, MD;  [facility];  Service: Gynecology;  Laterality: N/A;  . TOOTH EXTRACTION    . TRANSFORAMINAL LUMBAR INTERBODY FUSION (TLIF) WITH PEDICLE SCREW FIXATION 2 LEVEL N/A 04/13/2018   Procedure: RIGHT-SIDED LUMBAR 4-5 AND LUMBAR 5-SACRUM 1 TRANSFORAMINAL LUMBAR INTERBODY FUSION WITH INSTRUMENTATION AND ALLOGRAFT;  Surgeon: Phylliss Bob, MD;  Location: [facility];  Service: Orthopedics;  Laterality: N/A;  . WISDOM TOOTH EXTRACTION      FAMILY HISTORY: Family History  Problem Relation Age of Onset  . Diabetes Mother   . Hypertension Mother   . Heart disease Father        Pacemaker  . Thyroid disease Sister   . Schizophrenia Brother   . Colon cancer Neg Hx   . Esophageal cancer Neg Hx   . Rectal cancer Neg Hx   . Stomach cancer Neg Hx   . Breast cancer Neg Hx     SOCIAL HISTORY: Social History   Socioeconomic History  . Marital status: Legally Separated    Spouse name: Not on file  . Number of children: 3  . Years of education: 6  . Highest education level: Not on file  Occupational History  .  Occupation: Disability  Tobacco Use  . Smoking status: Never Smoker  . Smokeless tobacco: Never Used  Substance and Sexual Activity  . Alcohol use: No  . Drug use: No  . Sexual activity: Not Currently    Birth control/protection: Post-menopausal    Comment: 1st intercourse 59 yo-Fewer than 5 partners  Other Topics Concern  . Not on file  Social History Narrative   Moved from [place] to [place], to [place] at home with her nephew and her son.   Right-handed.   Occasional use of caffeine.   Social Determinants of Health   Financial Resource Strain:   . Difficulty of Paying Living Expenses: Not on file  Food Insecurity:   . Worried About Charity fundraiser in the Last Year:  Not on file  . Ran Out of Food in the Last Year: Not on file  Transportation Needs:   . Lack of Transportation (Medical): Not on file  . Lack of Transportation (Non-Medical): Not on file  Physical Activity:   . Days of Exercise per Week: Not on file  . Minutes of Exercise per Session: Not on file  Stress:   . Feeling of Stress : Not on file  Social Connections:   . Frequency of Communication with Friends and Family: Not on file  . Frequency of Social Gatherings with Friends and Family: Not on file  . Attends Religious Services: Not on file  . Active Member of Clubs or Organizations: Not on file  . Attends Archivist Meetings: Not on file  . Marital Status: Not on file  Intimate Partner Violence:   . Fear of Current or Ex-Partner: Not on file  . Emotionally Abused: Not on file  . Physically Abused: Not on file  . Sexually Abused: Not on file     PHYSICAL EXAM   Vitals:   09/13/19 1134  BP: 122/77  Pulse: 87  Temp: (!) 96.8 F (36 C)  Weight: 164 lb 8 oz (74.6 kg)  Height: 5\' 4"  (1.626 m)    Not recorded      Body mass index is 28.24 kg/m.  PHYSICAL EXAMNIATION:  Gen: NAD, conversant, well nourised, well groomed                     Cardiovascular: Regular  rate rhythm, no peripheral edema, warm, nontender. Eyes: Conjunctivae clear without exudates or hemorrhage Neck: Supple, no carotid bruits. Pulmonary: Clear to auscultation bilaterally   NEUROLOGICAL EXAM:  MENTAL STATUS: Speech:    Speech is normal; fluent and spontaneous with normal comprehension.  Cognition:     Orientation to time, place and person     Normal recent and remote memory     Normal Attention span and concentration     Normal Language, naming, repeating,spontaneous speech     Fund of knowledge   CRANIAL NERVES: CN II: Visual fields are full to confrontation. Pupils are round equal and briskly reactive to light. CN III, IV, VI: extraocular movement are normal. No ptosis. CN V: Facial sensation is intact to light touch CN VII: Face is symmetric with normal eye closure  CN VIII: Hearing is normal to causal conversation. CN IX, X: Phonation is normal. CN XI: Head turning and shoulder shrug are intact  MOTOR: There is no pronator drift of out-stretched arms. Muscle bulk and tone are normal. Muscle strength is normal.  REFLEXES: Reflexes are 2+ and symmetric at the biceps, triceps, knees, and ankles. Plantar responses are flexor.  SENSORY: Intact to light touch, pinprick and vibratory sensation are intact in fingers and toes.  COORDINATION: There is no trunk or limb dysmetria noted.  GAIT/STANCE: Posture is normal. Gait is steady with normal steps, base, arm swing, and turning. Heel and toe walking are normal. Tandem gait is normal.  Romberg is absent.   DIAGNOSTIC DATA (LABS, IMAGING, TESTING) - I reviewed patient records, labs, notes, testing and imaging myself where available.   ASSESSMENT AND PLAN  Sherry Anderson is a 59 y.o. female   Right trigeminal neuralgia, involving right V3 branch  Gabapentin 300 mg 3 times daily  MRI of the brain with without contrast through right trigeminal ganglion    Marcial Pacas, M.D. Ph.D.  [facility] [address]  [place], [place] [address] Ph: [phone] Fax: [phone]  CC: Dickie La, MD

## 2019-09-17 ENCOUNTER — Telehealth: Payer: Self-pay | Admitting: Neurology

## 2019-09-17 DIAGNOSIS — G500 Trigeminal neuralgia: Secondary | ICD-10-CM

## 2019-09-17 MED ORDER — PREGABALIN 150 MG PO CAPS: 150.0000 mg | ORAL_CAPSULE | Freq: Two times a day (BID) | ORAL | 5 refills | Status: DC

## 2019-09-17 MED ORDER — PREGABALIN 150 MG PO CAPS: 150.0000 mg | ORAL_CAPSULE | Freq: Three times a day (TID) | ORAL | 5 refills | Status: DC

## 2019-09-17 NOTE — Addendum Note (Signed)
Addended by: Marcial Pacas on: 09/17/2019 04:57 PM   Modules accepted: Orders

## 2019-09-17 NOTE — Telephone Encounter (Signed)
Patient called back in regards to missed call please follow up

## 2019-09-17 NOTE — Telephone Encounter (Signed)
Pt called stating that the gabapentin (NEURONTIN) 300 MG capsule is not working for her and she is wanting to know if something else can be given to her. Please advise.

## 2019-09-17 NOTE — Addendum Note (Signed)
Addended by: Marcial Pacas on: 09/17/2019 05:15 PM   Modules accepted: Orders

## 2019-09-17 NOTE — Telephone Encounter (Signed)
I have called in Lyrica 150mg  tid, she should not overlap Lyrica with Gabapentin,  Let her call us in 3-4 days after lyrica for progress report

## 2019-09-17 NOTE — Telephone Encounter (Signed)
I spoke to the patient. She is reporting significant pain despite taking gabapentin 300mg . It was prescribed TID but she has been taking it QID. States it is not providing any relief.

## 2019-09-17 NOTE — Addendum Note (Signed)
Addended by: Desmond Lope on: 09/17/2019 05:11 PM   Modules accepted: Orders

## 2019-09-17 NOTE — Telephone Encounter (Signed)
Left message requesting a return call.

## 2019-09-21 ENCOUNTER — Other Ambulatory Visit: Payer: Self-pay | Admitting: Family Medicine

## 2019-09-21 ENCOUNTER — Telehealth: Payer: Self-pay | Admitting: Neurology

## 2019-09-21 DIAGNOSIS — F39 Unspecified mood [affective] disorder: Secondary | ICD-10-CM

## 2019-09-21 NOTE — Telephone Encounter (Signed)
[name], MD  [name], [name], MD   Cc: Garvin Fila, MD  Here is the patient. She saw Dr. Krista Blue at the [facility] office on 09-13-2019. Please contact the patient for her needs.   Jindong       Previous Messages   ----- Message -----  From: Larey Seat, MD  Sent: 09/17/2019  1:07 PM EST  To: Garvin Fila, MD, [name], MD  Subject: patient not found in [facility]             We received a call form a Sherry Anderson on Friday , [birth year] - she has not been found in EP[facility] stated she saw you on 09-13-2019. Please contact this stroke patient .  Kay    Dear Sherry Anderson,  No wonder I couldn't find this patient as her name was misspelled by answerphone.  [name]

## 2019-09-21 NOTE — Telephone Encounter (Signed)
Garvin Fila, MD  Kveon Casanas, [name], MD; Marcial Pacas, MD  Patient`s name is Sherry Anderson and saw Dr Krista Blue ( whom she kept calling Dr Erlinda Hong for some reason ) on 09/13/19 and states neurontin 300 mg tid is not helping her trigeminal neuralgia Dr Krista Blue or [place] kindly call her back to change or increase her medicine  Pramod       Previous Messages   ----- Message -----  From: Larey Seat, MD  Sent: 09/17/2019  1:07 PM EST  To: Garvin Fila, MD, [name], MD  Subject: patient not found in [facility]             We received a call form a JUANA Mcdougall on Friday , 08-15-1961 - she has not been found in EPIC stated she saw you on 09-13-2019. Please contact this stroke patient .  [phone]

## 2019-09-24 ENCOUNTER — Other Ambulatory Visit: Payer: Self-pay

## 2019-09-24 ENCOUNTER — Ambulatory Visit (INDEPENDENT_AMBULATORY_CARE_PROVIDER_SITE_OTHER): Payer: Medicaid Other | Admitting: Family Medicine

## 2019-09-24 ENCOUNTER — Encounter: Payer: Self-pay | Admitting: Family Medicine

## 2019-09-24 DIAGNOSIS — R55 Syncope and collapse: Secondary | ICD-10-CM

## 2019-09-24 DIAGNOSIS — G500 Trigeminal neuralgia: Secondary | ICD-10-CM | POA: Diagnosis not present

## 2019-09-24 DIAGNOSIS — F39 Unspecified mood [affective] disorder: Secondary | ICD-10-CM

## 2019-09-24 NOTE — Patient Instructions (Signed)
It was great to see you again today!  Follow up in 1-2 months Call and see if they can get your MRI scheduled sooner  Call if any worsening chest pain, shortness of breath, or other episodes of passing out.  Be well, Dr. Ardelia Mems

## 2019-09-24 NOTE — Assessment & Plan Note (Signed)
Overall stable since last visit. Follow up in 1-2 months.

## 2019-09-24 NOTE — Assessment & Plan Note (Signed)
Suspect this was a vasovagal episode related to severe abdominal pain, in setting of taking new medication on an empty stomach and history of the same in the past (all with severe abdominal pain). No recurrent syncope since that night. Orthostatics normal today. Chest pain described by patient is very musculoskeletal in nature and is reproducible on exam with palpation.   At this time I do not think we need further workup unless she has recurrent syncope or worsening chest pain. Patient agrees with this assessment.

## 2019-09-24 NOTE — Progress Notes (Signed)
  Date of Visit: 09/24/2019   HPI:  Sherry Anderson presents today for follow up.   R face pain - has had recurrence of facial pain she had several years ago. Seen in ED on 1/26 and was given rx for methocarbamol and ibuprofen. Subsequently seen at neurology Dr. Krista Blue on 1/28 and diagnosed with trigeminal neuralgia, given rx for gabapentin which did not help. This has since been changed to lyrica. is taking lyrica 150mg  three times daily with some relief. Pain still present but improved. At its worst, patient reports facial pain was worse than natural childbirth. Has MRI scheduled for 2/18 but she does not want to wait that long.  Fainting episode - the night of 1/26 after she was seen in ED, she took her new rx for methocarbamol on an empty stomach. She had not eaten much that day due to the pain in her face. After taking the medication, developed severe abdominal pain. Got up to go to bathroom and passed out in bathroom. Sister and son had to help her get to bedroom. She fainted a total of 3 times that night. When she fell in the bathroom, hit her chest on the trashcan and had a big bruise there afterward. No subsequent fainting episodes. She does admit to two prior episodes of fainting, also in the setting of severe abdominal pain several years ago. Has had soreness in her chest since that night, worse with palpation, taking deep breaths, and bending over. Not worse with exertion. Improved with heating pad. Does endorse a bit of lightheadedness now with lyrica. No recurrent abdominal pain since then.  Depression - mood has been up and down since I last saw her. Made worse by the severe pain in her face. No SI or HI. Overall coping okay.  [place]: history of hyperlipidemia, hypertension, GERD, depression  PHYSICAL EXAM: BP 104/72   Pulse 96   Wt 163 lb 3.2 oz (74 kg)   LMP  (LMP Unknown)   SpO2 98%   BMI 28.01 kg/m  Gen: no acute distress, pleasant, cooperative HEENT: normocephalic, atraumatic    Heart: regular rate and rhythm, no murmur. Chest wall tender to palpation and reproduces the pain patient endorses. Lungs: clear to auscultation bilaterally, normal work of breathing  Neuro: alert, speech normal, grossly nonfocal Abdomen: soft, nontender to palpation   ASSESSMENT/PLAN:  Syncope Suspect this was a vasovagal episode related to severe abdominal pain, in setting of taking new medication on an empty stomach and history of the same in the past (all with severe abdominal pain). No recurrent syncope since that night. Orthostatics normal today. Chest pain described by patient is very musculoskeletal in nature and is reproducible on exam with palpation.   At this time I do not think we need further workup unless she has recurrent syncope or worsening chest pain. Patient agrees with this assessment.  Mood disorder ([place]) Overall stable since last visit. Follow up in 1-2 months.   Right trigeminal neuralgia Encouraged follow up with neurology. Advised she contact them to see if she can be scheduled sooner for her MRI.  FOLLOW UP: Follow up in 1-2 mos for mood  Total time during visit on day of service exceeded 30 minutes.  Sherry Anderson. Ardelia Mems, [place]

## 2019-09-24 NOTE — Assessment & Plan Note (Signed)
Encouraged follow up with neurology. Advised she contact them to see if she can be scheduled sooner for her MRI.

## 2019-09-24 NOTE — Telephone Encounter (Signed)
I called to check on the patient this morning. She was changed to Lyrica 150mg , one capsule TID on 09/17/2019. Says she has been only taking one capsule BID. She just increased the medication to TID on 09/22/19. She will call back if the higher dose is not helpful for her pain.

## 2019-10-04 ENCOUNTER — Other Ambulatory Visit: Payer: Medicaid Other

## 2019-10-05 ENCOUNTER — Other Ambulatory Visit: Payer: Medicaid Other

## 2019-10-09 ENCOUNTER — Ambulatory Visit
Admission: RE | Admit: 2019-10-09 | Discharge: 2019-10-09 | Disposition: A | Discharge status: Home / Self Care | Payer: Medicaid Other | Source: Ambulatory Visit | Attending: Neurology | Admitting: Neurology

## 2019-10-09 ENCOUNTER — Ambulatory Visit: Payer: Medicaid Other | Admitting: Neurology

## 2019-10-09 ENCOUNTER — Other Ambulatory Visit: Payer: Self-pay

## 2019-10-09 DIAGNOSIS — G500 Trigeminal neuralgia: Secondary | ICD-10-CM

## 2019-10-09 MED ORDER — GADOBENATE DIMEGLUMINE 529 MG/ML IV SOLN
15.0000 mL | Freq: Once | INTRAVENOUS | Status: AC | PRN
  Administered 2019-10-09: 15 mL via INTRAVENOUS

## 2019-10-11 ENCOUNTER — Telehealth: Payer: Self-pay | Admitting: Neurology

## 2019-10-11 DIAGNOSIS — G500 Trigeminal neuralgia: Secondary | ICD-10-CM

## 2019-10-11 MED ORDER — OXCARBAZEPINE 150 MG PO TABS: 150.0000 mg | ORAL_TABLET | Freq: Two times a day (BID) | ORAL | 11 refills | Status: DC

## 2019-10-11 NOTE — Addendum Note (Signed)
Addended by: Marcial Pacas on: 10/11/2019 05:04 PM   Modules accepted: Orders

## 2019-10-11 NOTE — Telephone Encounter (Signed)
She is agreeable to these medication changes and understands the dosing instructions. She will call back if her pain does not improve.

## 2019-10-11 NOTE — Telephone Encounter (Signed)
Pt called wanting to know if her MRI results have come in. Please advise. 

## 2019-10-11 NOTE — Telephone Encounter (Signed)
Mechanisms--Compression of the trigeminal nerve root is the main mechanism of TN, but brainstem lesions account for a small proportion of cases [2]. 1.Compression of the trigeminal nerve root - Most cases of TN are caused by compression of the trigeminal nerve root, usually within a few millimeters of entry into the pons (the root entry zone) [3]. Compression by an aberrant loop of an artery or vein is thought to account for 80 to 90 percent of cases [3-7].   2. Central sensitization   Add on Trileptal 150mg  bid, may decrease lyrica to 150mg  bid to decrease side effect.  Call for progress report in 1-2 weeks, if not getting better by then, move up her follow up with Judson Roch

## 2019-10-11 NOTE — Telephone Encounter (Signed)
She is taking Lyrica 150mg  TID but it does not provide much pain relief. She has to supplement with Tylenol. States she is really not tolerating the Lyrica too well. It makes her feel swimmy headed. She would like to know what may be the cause of her pain since her MRI was normal.

## 2019-10-11 NOTE — Telephone Encounter (Signed)
MRI of the brain with without contrast was normal, I send the results to her my chart

## 2019-10-15 ENCOUNTER — Telehealth: Payer: Self-pay | Admitting: Neurology

## 2019-10-15 NOTE — Telephone Encounter (Signed)
Per Dr. Rhea Belton last instructions, if these medications are not helpful, move her appt w/ Sarah up to an earlier date. The patient is getting limited relief with Trileptal 150g, one tab BID and Lyrica 150mg , one cap BID. States the pain is waking her from sleep at night. She has been rescheduled to 10/16/19.

## 2019-10-15 NOTE — Telephone Encounter (Signed)
Pt called stating that the pregabalin (LYRICA) 150 MG capsule and the OXcarbazepine (TRILEPTAL) 150 MG tablet are not working for her and that the pain wakes her up in the middle of the night. Please advise.

## 2019-10-16 ENCOUNTER — Encounter: Payer: Self-pay | Admitting: Neurology

## 2019-10-16 ENCOUNTER — Ambulatory Visit: Payer: Medicaid Other | Admitting: Neurology

## 2019-10-16 ENCOUNTER — Other Ambulatory Visit: Payer: Self-pay

## 2019-10-16 VITALS — BP 112/74 | HR 73 | Temp 97.2°F | Ht 64.0 in | Wt 167.6 lb

## 2019-10-16 DIAGNOSIS — G500 Trigeminal neuralgia: Secondary | ICD-10-CM | POA: Diagnosis not present

## 2019-10-16 MED ORDER — OXCARBAZEPINE 300 MG PO TABS: 300.0000 mg | ORAL_TABLET | Freq: Two times a day (BID) | ORAL | 5 refills | Status: DC

## 2019-10-16 NOTE — Progress Notes (Signed)
PATIENT: Sherry Anderson DOB: 1961/02/28  REASON FOR VISIT: follow up HISTORY FROM: patient  HISTORY OF PRESENT ILLNESS: Today 10/16/19  HISTORY Sherry Anderson is a 59 year old female, seen in request by her primary care physician Dr. Dorcas Mcmurray for evaluation of right jaw pain, initial evaluation was September 13, 2019.  I have reviewed and summarized the referring note from the referring physician.  She has past medical history of hypertension, hypothyroidism, on supplement,  She had one episode of similar right jaw pain about 3 years ago, lasting 3 to 4 months gradually improved, around November 2020, she had recurrent right side jaw pain, radiating pain from right ear to right jaw, sharp transient electric shooting pain,' worse than the childbirth pain," sometimes she has difficulty eating, opening her mouth, she has tried over-the-counter naproxen without helping  She denies hearing loss, denies facial weakness, denies visual loss  I personally reviewed MRI of the brain without contrast in May 2019 that was normal.  Laboratory evaluation January 2021: CMP was normal, creatinine of 0.87, normal CBC, hemoglobin of 13.4, TSH, total cholesterol of 266, LDL of 168,  Update October 16, 2019 SS: She was tried on gabapentin, was not beneficial, switched to Lyrica.  MRI of the brain with and without contrast through right trigeminal ganglion was normal.  She has complained of continued pain, Trileptal has been added 150 mg twice a day, taking Lyrica 150 mg twice a day.   Reports continued right-sided trigeminal pain, sharp, electric-like, keeping her up at night, sensitive to temperature, touch, eating. Medications seems to be helping slightly.  REVIEW OF SYSTEMS: Out of a complete 14 system review of symptoms, the patient complains only of the following symptoms, and all other reviewed systems are negative.  Jaw pain, right  ALLERGIES: No Known Allergies  HOME  MEDICATIONS: Outpatient Medications Prior to Visit  Medication Sig Dispense Refill  . acetaminophen (TYLENOL) 325 MG tablet Take 650 mg by mouth every 6 (six) hours as needed. OTC PRN    . Calcium Citrate-Vitamin D (CALCIUM + D PO) Take 1 tablet by mouth 2 (two) times daily.    Marland Kitchen levothyroxine (SYNTHROID) 75 MCG tablet Take 1 tablet (75 mcg total) by mouth every morning. 30 minutes before food 90 tablet 0  . lisinopril (ZESTRIL) 20 MG tablet TAKE 1 TABLET BY MOUTH EVERY DAY 90 tablet 0  . omeprazole (PRILOSEC) 20 MG capsule TAKE 1 CAPSULE BY MOUTH TWICE A DAY 180 capsule 0  . pregabalin (LYRICA) 150 MG capsule Take 1 capsule (150 mg total) by mouth 3 (three) times daily. 90 capsule 5  . triamterene-hydrochlorothiazide (DYAZIDE) 37.5-25 MG capsule TAKE 1 EACH (1 CAPSULE TOTAL) BY MOUTH DAILY. (Patient taking differently: Take 1 capsule by mouth daily. ) 90 capsule 0  . venlafaxine XR (EFFEXOR-XR) 75 MG 24 hr capsule TAKE 3 CAPSULES BY MOUTH EVERY DAY 270 capsule 1  . OXcarbazepine (TRILEPTAL) 150 MG tablet Take 1 tablet (150 mg total) by mouth 2 (two) times daily. 60 tablet 11  . Cyanocobalamin (VITAMIN B-12 PO) Take 1 tablet by mouth daily.     Marland Kitchen ibuprofen (ADVIL) 200 MG tablet Take 200 mg by mouth every 6 (six) hours as needed for moderate pain.    . meloxicam (MOBIC) 7.5 MG tablet Take 1 tablet (7.5 mg total) by mouth daily. 15 tablet 0  . methocarbamol (ROBAXIN) 500 MG tablet Take 1 tablet (500 mg total) by mouth every 8 (eight) hours as needed for muscle spasms. 15  tablet 0  . naproxen (NAPROSYN) 500 MG tablet Take 1 tablet (500 mg total) by mouth 2 (two) times daily. 10 tablet 0   No facility-administered medications prior to visit.    PAST MEDICAL HISTORY: Past Medical History:  Diagnosis Date  . Adenocarcinoma in situ (AIS) of uterine cervix 05/2017   Associated with high-grade dysplasia  . Anxiety and depression   . Cancer [facility]) 2002   left breast cancer   . Depression   .  Diverticulosis of colon   . Essential hypertension, benign   . GERD (gastroesophageal reflux disease)   . Hepatitis    unsure of which type  . High grade squamous intraepithelial cervical dysplasia 05/2017   Associated with adenocarcinoma in situ  . History of adenomatous polyp of colon    tubular adenoma's  02-08-2017  . History of gastritis   . History of left breast cancer 2002---- per pt no recurrence   dx DCIS --- s/p  left breast lumpectomy;  per pt radiation therapy completed same year and completed antiestrogen therapy   . History of radiation therapy    2002   left breast  . Hypothyroidism   . Pain    right ear and jaw  . Spinal stenosis   . Spondylolisthesis   . SVD (spontaneous vaginal delivery)    x 1    PAST SURGICAL HISTORY: Past Surgical History:  Procedure Laterality Date  . BREAST LUMPECTOMY Left 2002   ductal CA in situ  . CERVICAL CONIZATION W/BX N/A 06/08/2017   Procedure: CONIZATION CERVIX WITH BIOPSY;  Surgeon: Anastasio Auerbach, MD;  Location: [place];  Service: Gynecology;  Laterality: N/A;  . [place]   twins  . COLONOSCOPY  02-08-2017   dr Karleen Hampshire  . DILATATION & CURETTAGE/HYSTEROSCOPY WITH MYOSURE N/A 06/08/2017   Procedure: [place];  Surgeon: Anastasio Auerbach, MD;  Location: [place];  Service: Gynecology;  Laterality: N/A;  . ESOPHAGOGASTRODUODENOSCOPY  last one 11-24-2016   dr Karleen Hampshire  . ROBOTIC ASSISTED TOTAL HYSTERECTOMY WITH BILATERAL SALPINGO OOPHERECTOMY N/A 09/21/2017   Procedure: XI ROBOTIC ASSISTED TOTAL HYSTERECTOMY WITH BILATERAL SALPINGO OOPHORECTOMY, Lysis of Adhesions;  Surgeon: Princess Bruins, MD;  Location: [facility];  Service: Gynecology;  Laterality: N/A;  . TOOTH EXTRACTION    . TRANSFORAMINAL LUMBAR INTERBODY FUSION (TLIF) WITH PEDICLE SCREW FIXATION 2 LEVEL N/A 04/13/2018   Procedure: RIGHT-SIDED LUMBAR 4-5 AND LUMBAR 5-SACRUM 1  TRANSFORAMINAL LUMBAR INTERBODY FUSION WITH INSTRUMENTATION AND ALLOGRAFT;  Surgeon: Phylliss Bob, MD;  Location: [place];  Service: Orthopedics;  Laterality: N/A;  . WISDOM TOOTH EXTRACTION      FAMILY HISTORY: Family History  Problem Relation Age of Onset  . Diabetes Mother   . Hypertension Mother   . Heart disease Father        Pacemaker  . Thyroid disease Sister   . Schizophrenia Brother   . Colon cancer Neg Hx   . Esophageal cancer Neg Hx   . Rectal cancer Neg Hx   . Stomach cancer Neg Hx   . Breast cancer Neg Hx     SOCIAL HISTORY: Social History   Socioeconomic History  . Marital status: Legally Separated    Spouse name: Not on file  . Number of children: 3  . Years of education: 6  . Highest education level: Not on file  Occupational History  . Occupation: Disability  Tobacco Use  . Smoking status: Never Smoker  .  Smokeless tobacco: Never Used  Substance and Sexual Activity  . Alcohol use: No  . Drug use: No  . Sexual activity: Not Currently    Birth control/protection: Post-menopausal    Comment: 1st intercourse 59 yo-Fewer than 5 partners  Other Topics Concern  . Not on file  Social History Narrative   Moved from [place] to [place], to [place] at home with her nephew and her son.   Right-handed.   Occasional use of caffeine.   Social Determinants of Health   Financial Resource Strain:   . Difficulty of Paying Living Expenses: Not on file  Food Insecurity:   . Worried About Charity fundraiser in the Last Year: Not on file  . Ran Out of Food in the Last Year: Not on file  Transportation Needs:   . Lack of Transportation (Medical): Not on file  . Lack of Transportation (Non-Medical): Not on file  Physical Activity:   . Days of Exercise per Week: Not on file  . Minutes of Exercise per Session: Not on file  Stress:   . Feeling of Stress : Not on file  Social Connections:   . Frequency of Communication with Friends and Family: Not on  file  . Frequency of Social Gatherings with Friends and Family: Not on file  . Attends Religious Services: Not on file  . Active Member of Clubs or Organizations: Not on file  . Attends Archivist Meetings: Not on file  . Marital Status: Not on file  Intimate Partner Violence:   . Fear of Current or Ex-Partner: Not on file  . Emotionally Abused: Not on file  . Physically Abused: Not on file  . Sexually Abused: Not on file    PHYSICAL EXAM  Vitals:   10/16/19 0842  BP: 112/74  Pulse: 73  Temp: (!) 97.2 F (36.2 C)  SpO2: 93%  Weight: 167 lb 9.6 oz (76 kg)  Height: 5\' 4"  (1.626 m)   Body mass index is 28.77 kg/m.  Generalized: Well developed, in no acute distress   Neurological examination  Mentation: Alert oriented to time, place, history taking. Follows all commands speech and language fluent Cranial nerve II-XII: Pupils were equal round reactive to light. Extraocular movements were full, visual field were full on confrontational test. Facial symmetry noted, hypersensitive right V3 around jaw.  Head turning and shoulder shrug were normal and symmetric. Motor: The motor testing reveals 5 over 5 strength of all 4 extremities. Good symmetric motor tone is noted throughout.  Sensory: Sensory testing is intact to soft touch on all 4 extremities. No evidence of extinction is noted.  Coordination: Cerebellar testing reveals good finger-nose-finger and heel-to-shin bilaterally.  Gait and station: Gait is normal.  Reflexes: Deep tendon reflexes are symmetric and normal bilaterally.   DIAGNOSTIC DATA (LABS, IMAGING, TESTING) - I reviewed patient records, labs, notes, testing and imaging myself where available.  Lab Results  Component Value Date   WBC 6.1 09/11/2019   HGB 13.4 09/11/2019   HCT 40.6 09/11/2019   MCV 88.3 09/11/2019   PLT 357 09/11/2019      Component Value Date/Time   NA 138 09/11/2019 1422   NA 139 08/23/2019 1104   K 3.5 09/11/2019 1422   CL 101  09/11/2019 1422   CO2 26 09/11/2019 1422   GLUCOSE 103 (H) 09/11/2019 1422   BUN 21 (H) 09/11/2019 1422   BUN 13 08/23/2019 1104   CREATININE 0.87 09/11/2019 1422  CREATININE 0.96 11/02/2017 1026   CALCIUM 9.6 09/11/2019 1422   PROT 7.8 08/23/2019 1104   ALBUMIN 4.6 08/23/2019 1104   AST 20 08/23/2019 1104   ALT 18 08/23/2019 1104   ALKPHOS 74 08/23/2019 1104   BILITOT 0.3 08/23/2019 1104   GFRNONAA >60 09/11/2019 1422   GFRNONAA 76 07/27/2016 1001   GFRAA >60 09/11/2019 1422   GFRAA 87 07/27/2016 1001   Lab Results  Component Value Date   CHOL 266 (H) 08/23/2019   HDL 65 08/23/2019   LDLCALC 168 (H) 08/23/2019   LDLDIRECT 157 (H) 08/20/2008   TRIG 181 (H) 08/23/2019   CHOLHDL 4.1 08/23/2019   Lab Results  Component Value Date   HGBA1C 5.2 07/27/2016   No results found for: VITAMINB12 Lab Results  Component Value Date   TSH 2.770 08/23/2019   ASSESSMENT AND PLAN 59 y.o. year old female  has a past medical history of Adenocarcinoma in situ (AIS) of uterine cervix (05/2017), Anxiety and depression, Cancer ([place]) (2002), Depression, Diverticulosis of colon, Essential hypertension, benign, GERD (gastroesophageal reflux disease), Hepatitis, High grade squamous intraepithelial cervical dysplasia (05/2017), History of adenomatous polyp of colon, History of gastritis, History of left breast cancer (2002---- per pt no recurrence), History of radiation therapy, Hypothyroidism, Pain, Spinal stenosis, Spondylolisthesis, and SVD (spontaneous vaginal delivery). here with:  1.  Right trigeminal neuralgia, involving right V3 branch -Worsening symptoms, can't sleep at night, electric, shocklike pains -Increase Lyrica 150 mg 3 times a day, currently taking twice a day, early on tried 3 times daily had some drowsiness -Increase Trileptal 300 mg twice a day -Discussed with Dr. Krista Blue, hold off on steroid treatment for now -MRI of the brain with and without contrast to right trigeminal ganglion  was normal -Follow-up in 2 months or sooner if needed, call if continued symptoms, if medications helping may increase Trileptal or consider 3 times daily dosing  I spent 15 minutes with the patient. 50% of this time was spent discussing her plan of care.   Butler Denmark, AGNP-C, DNP 10/16/2019, 9:20 AM [facility] [address] [place], [place] [address] [phone]

## 2019-10-16 NOTE — Patient Instructions (Signed)
Increase Trileptal to 300 mg twice daily  Increase Lyrica to 150 mg 3 times daily  See you back in 2 months

## 2019-10-17 ENCOUNTER — Other Ambulatory Visit: Payer: Medicaid Other

## 2019-10-17 NOTE — Progress Notes (Signed)
I have reviewed and agreed above plan. 

## 2019-10-18 ENCOUNTER — Telehealth: Payer: Self-pay | Admitting: Neurology

## 2019-10-18 DIAGNOSIS — G500 Trigeminal neuralgia: Secondary | ICD-10-CM

## 2019-10-18 NOTE — Telephone Encounter (Signed)
I called Sherry Anderson and Sherry Anderson will try taking the trileptal 2 tabs in daily and 1 at night, and keep lyrica 1 tid.  I think SS/NP misunderstood the message and though Sherry Anderson was worse at night.  She is actually having problems both times of day. SS/NP gone will send to Dr. Krista Blue for her recommendation. Please advise.

## 2019-10-18 NOTE — Telephone Encounter (Signed)
It is Ok to heavy dose during the day, may take trileptal 300mg  up to 2 tabs bid, Lyrica 500mg  tid, call for progress report next week,         I called pt and pt will try taking the trileptal 2 tabs in daily and 1 at night, and keep lyrica 1 tid.  I think SS/NP misunderstood the message and though pt was worse at night.  She is actually having problems both times of day

## 2019-10-18 NOTE — Telephone Encounter (Signed)
If bedtime is problematic, she can try taking Trileptal 600 mg at bedtime, along with 300 mg in the morning.  Keep taking Lyrica 150 mg 3 times a day, or even taking 150 mg in the morning, 300 mg at bedtime. Sodium level was 138 in January.

## 2019-10-18 NOTE — Telephone Encounter (Signed)
Patient called stating the pregabalin (LYRICA) 150 MG & capsuleOxcarbazepine (TRILEPTAL) 300 MG tablet   is not working for her and she still has pain and would like to know what other options are available   Please follow up.

## 2019-10-18 NOTE — Telephone Encounter (Signed)
The increase of medication has not helped. pleae advise. She stated she was having during the day, but now is at night 2-3 x a night.

## 2019-10-19 ENCOUNTER — Other Ambulatory Visit: Payer: Self-pay

## 2019-10-19 ENCOUNTER — Encounter (HOSPITAL_COMMUNITY): Payer: Self-pay | Admitting: *Deleted

## 2019-10-19 ENCOUNTER — Emergency Department (HOSPITAL_COMMUNITY)
Admission: EM | Admit: 2019-10-19 | Discharge: 2019-10-19 | Disposition: A | Discharge status: Home / Self Care | Payer: Medicaid Other | Attending: Emergency Medicine | Admitting: Emergency Medicine

## 2019-10-19 DIAGNOSIS — Z853 Personal history of malignant neoplasm of breast: Secondary | ICD-10-CM | POA: Diagnosis not present

## 2019-10-19 DIAGNOSIS — E039 Hypothyroidism, unspecified: Secondary | ICD-10-CM | POA: Insufficient documentation

## 2019-10-19 DIAGNOSIS — I10 Essential (primary) hypertension: Secondary | ICD-10-CM | POA: Diagnosis not present

## 2019-10-19 DIAGNOSIS — Z8541 Personal history of malignant neoplasm of cervix uteri: Secondary | ICD-10-CM | POA: Insufficient documentation

## 2019-10-19 DIAGNOSIS — G500 Trigeminal neuralgia: Secondary | ICD-10-CM | POA: Diagnosis not present

## 2019-10-19 DIAGNOSIS — Z79899 Other long term (current) drug therapy: Secondary | ICD-10-CM | POA: Diagnosis not present

## 2019-10-19 DIAGNOSIS — R519 Headache, unspecified: Secondary | ICD-10-CM | POA: Diagnosis present

## 2019-10-19 MED ORDER — HYDROCODONE-ACETAMINOPHEN 5-325 MG PO TABS: 2.0000 | ORAL_TABLET | ORAL | 0 refills | Status: DC | PRN

## 2019-10-19 NOTE — ED Provider Notes (Signed)
[facility] EMERGENCY DEPARTMENT Provider Note   CSN: [id number] Arrival date & time: 10/19/19  0730     History Chief Complaint  Patient presents with  . Facial Pain    Sherry Anderson is a 59 y.o. female.  Pt reports she has trigeminal neuralgia.  Pt reports she has not slept in 4 days due to pain.  Pt's neurologist is adjusting her medications.  Pt reports this is the same pain.  No recent illness. No fever or chills   The history is provided by the patient. No language interpreter was used.       Past Medical History:  Diagnosis Date  . Adenocarcinoma in situ (AIS) of uterine cervix 05/2017   Associated with high-grade dysplasia  . Anxiety and depression   . Cancer [facility]) 2002   left breast cancer   . Depression   . Diverticulosis of colon   . Essential hypertension, benign   . GERD (gastroesophageal reflux disease)   . Hepatitis    unsure of which type  . High grade squamous intraepithelial cervical dysplasia 05/2017   Associated with adenocarcinoma in situ  . History of adenomatous polyp of colon    tubular adenoma's  02-08-2017  . History of gastritis   . History of left breast cancer 2002---- per pt no recurrence   dx DCIS --- s/p  left breast lumpectomy;  per pt radiation therapy completed same year and completed antiestrogen therapy   . History of radiation therapy    2002   left breast  . Hypothyroidism   . Pain    right ear and jaw  . Spinal stenosis   . Spondylolisthesis   . SVD (spontaneous vaginal delivery)    x 1    Patient Active Problem List   Diagnosis Date Noted  . Syncope 09/24/2019  . Right trigeminal neuralgia 09/13/2019  . GERD (gastroesophageal reflux disease) 09/28/2018  . Dyspnea 09/28/2018  . Dental caries 06/21/2018  . Radiculopathy 04/13/2018  . Subclinical hypothyroidism 11/18/2017  . Atypical cervical glandular cells 04/21/2017  . Right-sided face pain 03/02/2017  . Tremor of right hand 11/07/2015  .  Right-sided sensorineural hearing loss 11/13/2014  . DDD (degenerative disc disease), lumbar 10/21/2014  . Knee pain, bilateral 11/06/2013  . Left wrist pain 03/02/2013  . Anemia 01/25/2012  . Right ear pain 01/19/2012  . HEADACHE 02/04/2010  . Hyperlipidemia 08/07/2009  . HEARING LOSS, RIGHT EAR 01/17/2008  . CARPAL TUNNEL SYNDROME, BILATERAL 07/31/2007  . LATERAL EPICONDYLITIS, BILATERAL 07/18/2007  . Fatigue due to depression 04/06/2007  . Gastritis and gastroduodenitis 11/21/2006  . Mood disorder ([place]) 10/17/2006  . HYPERTENSION, BENIGN ESSENTIAL 10/17/2006  . CA IN SITU, BREAST 02/15/2001    Past Surgical History:  Procedure Laterality Date  . BREAST LUMPECTOMY Left 2002   ductal CA in situ  . CERVICAL CONIZATION W/BX N/A 06/08/2017   Procedure: CONIZATION CERVIX WITH BIOPSY;  Surgeon: Anastasio Auerbach, MD;  Location: [place];  Service: Gynecology;  Laterality: N/A;  . [place]   twins  . COLONOSCOPY  02-08-2017   dr Karleen Hampshire  . DILATATION & CURETTAGE/HYSTEROSCOPY WITH MYOSURE N/A 06/08/2017   Procedure: [place];  Surgeon: Anastasio Auerbach, MD;  Location: [place];  Service: Gynecology;  Laterality: N/A;  . ESOPHAGOGASTRODUODENOSCOPY  last one 11-24-2016   dr Karleen Hampshire  . ROBOTIC ASSISTED TOTAL HYSTERECTOMY WITH BILATERAL SALPINGO OOPHERECTOMY N/A 09/21/2017   Procedure: XI ROBOTIC ASSISTED  TOTAL HYSTERECTOMY WITH BILATERAL SALPINGO OOPHORECTOMY, Lysis of Adhesions;  Surgeon: Princess Bruins, MD;  Location: [facility];  Service: Gynecology;  Laterality: N/A;  . TOOTH EXTRACTION    . TRANSFORAMINAL LUMBAR INTERBODY FUSION (TLIF) WITH PEDICLE SCREW FIXATION 2 LEVEL N/A 04/13/2018   Procedure: RIGHT-SIDED LUMBAR 4-5 AND LUMBAR 5-SACRUM 1 TRANSFORAMINAL LUMBAR INTERBODY FUSION WITH INSTRUMENTATION AND ALLOGRAFT;  Surgeon: Phylliss Bob, MD;  Location: [place];  Service: Orthopedics;   Laterality: N/A;  . WISDOM TOOTH EXTRACTION       OB History    Gravida  2   Para      Term      Preterm      AB      Living  3     SAB      TAB      Ectopic      Multiple  1   Live Births              Family History  Problem Relation Age of Onset  . Diabetes Mother   . Hypertension Mother   . Heart disease Father        Pacemaker  . Thyroid disease Sister   . Schizophrenia Brother   . Colon cancer Neg Hx   . Esophageal cancer Neg Hx   . Rectal cancer Neg Hx   . Stomach cancer Neg Hx   . Breast cancer Neg Hx     Social History   Tobacco Use  . Smoking status: Never Smoker  . Smokeless tobacco: Never Used  Substance Use Topics  . Alcohol use: No  . Drug use: No    Home Medications Prior to Admission medications   Medication Sig Start Date End Date Taking? Authorizing Provider  acetaminophen (TYLENOL) 325 MG tablet Take 650 mg by mouth every 6 (six) hours as needed. OTC PRN    [provider]  Calcium Citrate-Vitamin D (CALCIUM + D PO) Take 1 tablet by mouth 2 (two) times daily.    [provider]  HYDROcodone-acetaminophen (NORCO/VICODIN) 5-325 MG tablet Take 2 tablets by mouth every 4 (four) hours as needed. 10/19/19   Fransico Meadow, PA-C  levothyroxine (SYNTHROID) 75 MCG tablet Take 1 tablet (75 mcg total) by mouth every morning. 30 minutes before food 08/08/19   Leeanne Rio, MD  lisinopril (ZESTRIL) 20 MG tablet TAKE 1 TABLET BY MOUTH EVERY DAY 08/13/19   Lind Covert, MD  omeprazole (PRILOSEC) 20 MG capsule TAKE 1 CAPSULE BY MOUTH TWICE A DAY 09/11/19   Leeanne Rio, MD  Oxcarbazepine (TRILEPTAL) 300 MG tablet Take 1 tablet (300 mg total) by mouth 2 (two) times daily. 10/16/19   Suzzanne Cloud, NP  pregabalin (LYRICA) 150 MG capsule Take 1 capsule (150 mg total) by mouth 3 (three) times daily. 09/17/19   Marcial Pacas, MD  triamterene-hydrochlorothiazide (DYAZIDE) 37.5-25 MG capsule TAKE 1 EACH (1 CAPSULE  TOTAL) BY MOUTH DAILY. Patient taking differently: Take 1 capsule by mouth daily.  08/13/19   Lind Covert, MD  venlafaxine XR (EFFEXOR-XR) 75 MG 24 hr capsule TAKE 3 CAPSULES BY MOUTH EVERY DAY 09/26/19   Leeanne Rio, MD    Allergies    Patient has no known allergies.  Review of Systems   Review of Systems  All other systems reviewed and are negative.   Physical Exam Updated Vital Signs BP (!) 163/87 (BP Location: Right Arm)   Pulse 77   Temp 97.9 F (36.6 C) (  Oral)   Resp 20   Ht 5\' 4"  (1.626 m)   Wt 75.8 kg   LMP  (LMP Unknown)   SpO2 100%   BMI 28.67 kg/m   Physical Exam Vitals and nursing note reviewed.  Constitutional:      Appearance: She is well-developed.  HENT:     Head: Normocephalic.     Nose: Nose normal.     Mouth/Throat:     Pharynx: Oropharynx is clear.  Eyes:     Pupils: Pupils are equal, round, and reactive to light.  Cardiovascular:     Rate and Rhythm: Normal rate.  Pulmonary:     Effort: Pulmonary effort is normal.  Abdominal:     General: There is no distension.  Musculoskeletal:        General: Normal range of motion.     Cervical back: Normal range of motion.  Skin:    General: Skin is warm.  Neurological:     Mental Status: She is alert and oriented to person, place, and time.  Psychiatric:        Mood and Affect: Mood normal.     ED Results / Procedures / Treatments   Labs (all labs ordered are listed, but only abnormal results are displayed) Labs Reviewed - No data to display  EKG None  Radiology No results found.  Procedures Procedures (including critical care time)  Medications Ordered in ED Medications - No data to display  ED Course  I have reviewed the triage vital signs and the nursing notes.  Pertinent labs & imaging results that were available during my care of the patient were reviewed by me and considered in my medical decision making (see chart for details).    MDM  Rules/Calculators/A&P                      MDM: Pt reports she is suppose to talk with neurology on Monday.   I will give pt  10 hydrocodne  To help pt with pain until she can see her MD on Monday    Final Clinical Impression(s) / ED Diagnoses Final diagnoses:  Trigeminal neuralgia    Rx / DC Orders ED Discharge Orders         Ordered    HYDROcodone-acetaminophen (NORCO/VICODIN) 5-325 MG tablet  Every 4 hours PRN     10/19/19 1028        An After Visit Summary was printed and given to the patient.    Fransico Meadow, [place] 10/19/19 1033    [place], Wenda Overland, MD 10/19/19 1339

## 2019-10-19 NOTE — ED Triage Notes (Signed)
C/o pain inse left side of her face onset 1 month ago, states she is taking oxcarbazepine and pregabalin and its not working. States she wasn't able to sleep last pm.

## 2019-10-21 ENCOUNTER — Other Ambulatory Visit: Payer: Self-pay | Admitting: Family Medicine

## 2019-10-22 MED ORDER — HYDROCODONE-ACETAMINOPHEN 5-325 MG PO TABS: 1.0000 | ORAL_TABLET | Freq: Four times a day (QID) | ORAL | 0 refills | Status: DC | PRN

## 2019-10-22 MED ORDER — OXCARBAZEPINE 300 MG PO TABS: ORAL_TABLET | ORAL | 5 refills | Status: DC

## 2019-10-22 NOTE — Telephone Encounter (Signed)
I called pt and she is going to go to [facility] for gamma knife.  She wants the surgery.  Referral as been made.

## 2019-10-22 NOTE — Telephone Encounter (Signed)
I called the patient. She has continued right trigeminal neuralgia pain. Went to the ER, given hydrocodone, only 10 pills 10/19/2019. It is helping, she only got 10 pills. She can't sleep or eat or wash her face. The pain is all the time, is worse at night.  She is currently taking oxcarbazepine 300 mg in the morning, 600 mg at bedtime, not taking Lyrica, taking hydrocodone 2 tablets every 4 hours.  1.  Increase oxcarbazepine 300 mg in the morning, 300 mg midday, 600 at bedtime  2.  Take Lyrica 150 mg 3 times a day   3.  Take 1 Norco 5-325 mg as needed at bedtime, only have 20 pills  4.  Refer to [facility] Dr. Salomon Fick for possible gamma knife

## 2019-10-22 NOTE — Telephone Encounter (Signed)
Pt called stating that the change that was made did not help her with the pain. Please advise.

## 2019-10-22 NOTE — Addendum Note (Signed)
Addended by: Suzzanne Cloud on: 10/22/2019 01:50 PM   Modules accepted: Orders

## 2019-11-08 ENCOUNTER — Other Ambulatory Visit: Payer: Self-pay

## 2019-11-08 ENCOUNTER — Other Ambulatory Visit: Payer: Self-pay | Admitting: Family Medicine

## 2019-11-08 ENCOUNTER — Ambulatory Visit
Admission: RE | Admit: 2019-11-08 | Discharge: 2019-11-08 | Disposition: A | Discharge status: Home / Self Care | Payer: Medicaid Other | Source: Ambulatory Visit | Attending: Family Medicine | Admitting: Family Medicine

## 2019-11-08 DIAGNOSIS — Z1231 Encounter for screening mammogram for malignant neoplasm of breast: Secondary | ICD-10-CM

## 2019-11-13 ENCOUNTER — Ambulatory Visit: Payer: Medicaid Other | Admitting: Neurology

## 2019-12-06 ENCOUNTER — Encounter: Payer: Self-pay | Admitting: Gastroenterology

## 2019-12-10 NOTE — Telephone Encounter (Signed)
Pt called stating the referral for her surgery was never received and would like it to be resent if possible.

## 2019-12-12 NOTE — Telephone Encounter (Signed)
Resent referral  

## 2019-12-18 ENCOUNTER — Ambulatory Visit: Payer: Medicaid Other | Admitting: Neurology

## 2019-12-18 ENCOUNTER — Telehealth: Payer: Self-pay

## 2019-12-18 NOTE — Progress Notes (Deleted)
PATIENT: Sherry Anderson DOB: 01-03-1961  REASON FOR VISIT: follow up HISTORY FROM: patient  HISTORY OF PRESENT ILLNESS: Today 12/18/19  HISTORY  HISTORY Sherry Anderson a 59 year old female, seen in request byher primary care physician Dr.Neal, Sarafor evaluation of right jaw pain, initial evaluation was September 13, 2019.  I have reviewed and summarized the referring note from the referring physician.She has past medical history of hypertension, hypothyroidism, on supplement,  She had one episode of similar right jaw pain about 3 years ago, lasting 3 to 4 months gradually improved, around November 2020, she had recurrent right side jaw pain, radiating pain from right ear to right jaw, sharp transient electric shooting pain,'worse than the childbirth pain,"sometimes she has difficulty eating, opening her mouth, she has tried over-the-counter naproxen without helping  She denies hearing loss, denies facial weakness, denies visual loss  I personally reviewed MRI of the brain without contrast in May 2019 that was normal.  Laboratory evaluation January 2021:CMP was normal, creatinine of 0.87, normal CBC, hemoglobin of 13.4, TSH, total cholesterol of 266, LDL of 168,  Update October 16, 2019 SS: She was tried on gabapentin, was not beneficial, switched to Lyrica.  MRI of the brain with and without contrast through right trigeminal ganglion was normal.  She has complained of continued pain, Trileptal has been added 150 mg twice a day, taking Lyrica 150 mg twice a day.   Reports continued right-sided trigeminal pain, sharp, electric-like, keeping her up at night, sensitive to temperature, touch, eating. Medications seems to be helping slightly.  Update Dec 18, 2019 SS:   REVIEW OF SYSTEMS: Out of a complete 14 system review of symptoms, the patient complains only of the following symptoms, and all other reviewed systems are negative.  ALLERGIES: No Known  Allergies  HOME MEDICATIONS: Outpatient Medications Prior to Visit  Medication Sig Dispense Refill  . acetaminophen (TYLENOL) 325 MG tablet Take 650 mg by mouth every 6 (six) hours as needed. OTC PRN    . Calcium Citrate-Vitamin D (CALCIUM + D PO) Take 1 tablet by mouth 2 (two) times daily.    Marland Kitchen HYDROcodone-acetaminophen (NORCO) 5-325 MG tablet Take 1 tablet by mouth every 6 (six) hours as needed for moderate pain. 20 tablet 0  . HYDROcodone-acetaminophen (NORCO/VICODIN) 5-325 MG tablet Take 2 tablets by mouth every 4 (four) hours as needed. 10 tablet 0  . levothyroxine (SYNTHROID) 75 MCG tablet Take 1 tablet (75 mcg total) by mouth every morning. 30 minutes before food 90 tablet 0  . lisinopril (ZESTRIL) 20 MG tablet TAKE 1 TABLET BY MOUTH EVERY DAY 90 tablet 0  . omeprazole (PRILOSEC) 20 MG capsule TAKE 1 CAPSULE BY MOUTH TWICE A DAY 180 capsule 0  . Oxcarbazepine (TRILEPTAL) 300 MG tablet Take 1 in the morning, 1 midday, 2 at bedtime 120 tablet 5  . pregabalin (LYRICA) 150 MG capsule Take 1 capsule (150 mg total) by mouth 3 (three) times daily. 90 capsule 5  . triamterene-hydrochlorothiazide (DYAZIDE) 37.5-25 MG capsule TAKE 1 EACH (1 CAPSULE TOTAL) BY MOUTH DAILY. 90 capsule 0  . venlafaxine XR (EFFEXOR-XR) 75 MG 24 hr capsule TAKE 3 CAPSULES BY MOUTH EVERY DAY 270 capsule 1   No facility-administered medications prior to visit.    PAST MEDICAL HISTORY: Past Medical History:  Diagnosis Date  . Adenocarcinoma in situ (AIS) of uterine cervix 05/2017   Associated with high-grade dysplasia  . Anxiety and depression   . Breast cancer ([place]) 2002   left breast cancer   .  Depression   . Diverticulosis of colon   . Essential hypertension, benign   . GERD (gastroesophageal reflux disease)   . Hepatitis    unsure of which type  . High grade squamous intraepithelial cervical dysplasia 05/2017   Associated with adenocarcinoma in situ  . History of adenomatous polyp of colon    tubular  adenoma's  02-08-2017  . History of gastritis   . History of left breast cancer 2002---- per pt no recurrence   dx DCIS --- s/p  left breast lumpectomy;  per pt radiation therapy completed same year and completed antiestrogen therapy   . History of radiation therapy    2002   left breast  . Hypothyroidism   . Pain    right ear and jaw  . Spinal stenosis   . Spondylolisthesis   . SVD (spontaneous vaginal delivery)    x 1    PAST SURGICAL HISTORY: Past Surgical History:  Procedure Laterality Date  . BREAST LUMPECTOMY Left 2002   ductal CA in situ  . CERVICAL CONIZATION W/BX N/A 06/08/2017   Procedure: CONIZATION CERVIX WITH BIOPSY;  Surgeon: Anastasio Auerbach, MD;  Location: [place];  Service: Gynecology;  Laterality: N/A;  . [place]   twins  . COLONOSCOPY  02-08-2017   dr Karleen Hampshire  . DILATATION & CURETTAGE/HYSTEROSCOPY WITH MYOSURE N/A 06/08/2017   Procedure: [place];  Surgeon: Anastasio Auerbach, MD;  Location: [place];  Service: Gynecology;  Laterality: N/A;  . ESOPHAGOGASTRODUODENOSCOPY  last one 11-24-2016   dr Karleen Hampshire  . ROBOTIC ASSISTED TOTAL HYSTERECTOMY WITH BILATERAL SALPINGO OOPHERECTOMY N/A 09/21/2017   Procedure: XI ROBOTIC ASSISTED TOTAL HYSTERECTOMY WITH BILATERAL SALPINGO OOPHORECTOMY, Lysis of Adhesions;  Surgeon: Princess Bruins, MD;  [facility];  Service: Gynecology;  Laterality: N/A;  . TOOTH EXTRACTION    . TRANSFORAMINAL LUMBAR INTERBODY FUSION (TLIF) WITH PEDICLE SCREW FIXATION 2 LEVEL N/A 04/13/2018   Procedure: RIGHT-SIDED LUMBAR 4-5 AND LUMBAR 5-SACRUM 1 TRANSFORAMINAL LUMBAR INTERBODY FUSION WITH INSTRUMENTATION AND ALLOGRAFT;  Surgeon: Phylliss Bob, MD;  Location: [place];  Service: Orthopedics;  Laterality: N/A;  . WISDOM TOOTH EXTRACTION      FAMILY HISTORY: Family History  Problem Relation Age of Onset  . Diabetes Mother   . Hypertension Mother    . Heart disease Father        Pacemaker  . Thyroid disease Sister   . Schizophrenia Brother   . Colon cancer Neg Hx   . Esophageal cancer Neg Hx   . Rectal cancer Neg Hx   . Stomach cancer Neg Hx   . Breast cancer Neg Hx     SOCIAL HISTORY: Social History   Socioeconomic History  . Marital status: Legally Separated    Spouse name: Not on file  . Number of children: 3  . Years of education: 6  . Highest education level: Not on file  Occupational History  . Occupation: Disability  Tobacco Use  . Smoking status: Never Smoker  . Smokeless tobacco: Never Used  Substance and Sexual Activity  . Alcohol use: No  . Drug use: No  . Sexual activity: Not Currently    Birth control/protection: Post-menopausal    Comment: 1st intercourse 59 yo-Fewer than 5 partners  Other Topics Concern  . Not on file  Social History Narrative   Moved from [place] to [place], to [place] at home with her nephew and her son.  Right-handed.   Occasional use of caffeine.   Social Determinants of Health   Financial Resource Strain:   . Difficulty of Paying Living Expenses:   Food Insecurity:   . Worried About Charity fundraiser in the Last Year:   . Arboriculturist in the Last Year:   Transportation Needs:   . Film/video editor (Medical):   Marland Kitchen Lack of Transportation (Non-Medical):   Physical Activity:   . Days of Exercise per Week:   . Minutes of Exercise per Session:   Stress:   . Feeling of Stress :   Social Connections:   . Frequency of Communication with Friends and Family:   . Frequency of Social Gatherings with Friends and Family:   . Attends Religious Services:   . Active Member of Clubs or Organizations:   . Attends Archivist Meetings:   Marland Kitchen Marital Status:   Intimate Partner Violence:   . Fear of Current or Ex-Partner:   . Emotionally Abused:   Marland Kitchen Physically Abused:   . Sexually Abused:       PHYSICAL EXAM  There were no vitals filed for  this visit. There is no height or weight on file to calculate BMI.  Generalized: Well developed, in no acute distress   Neurological examination  Mentation: Alert oriented to time, place, history taking. Follows all commands speech and language fluent Cranial nerve II-XII: Pupils were equal round reactive to light. Extraocular movements were full, visual field were full on confrontational test. Facial sensation and strength were normal. Uvula tongue midline. Head turning and shoulder shrug  were normal and symmetric. Motor: The motor testing reveals 5 over 5 strength of all 4 extremities. Good symmetric motor tone is noted throughout.  Sensory: Sensory testing is intact to soft touch on all 4 extremities. No evidence of extinction is noted.  Coordination: Cerebellar testing reveals good finger-nose-finger and heel-to-shin bilaterally.  Gait and station: Gait is normal. Tandem gait is normal. Romberg is negative. No drift is seen.  Reflexes: Deep tendon reflexes are symmetric and normal bilaterally.   DIAGNOSTIC DATA (LABS, IMAGING, TESTING) - I reviewed patient records, labs, notes, testing and imaging myself where available.  Lab Results  Component Value Date   WBC 6.1 09/11/2019   HGB 13.4 09/11/2019   HCT 40.6 09/11/2019   MCV 88.3 09/11/2019   PLT 357 09/11/2019      Component Value Date/Time   NA 138 09/11/2019 1422   NA 139 08/23/2019 1104   K 3.5 09/11/2019 1422   CL 101 09/11/2019 1422   CO2 26 09/11/2019 1422   GLUCOSE 103 (H) 09/11/2019 1422   BUN 21 (H) 09/11/2019 1422   BUN 13 08/23/2019 1104   CREATININE 0.87 09/11/2019 1422   CREATININE 0.96 11/02/2017 1026   CALCIUM 9.6 09/11/2019 1422   PROT 7.8 08/23/2019 1104   ALBUMIN 4.6 08/23/2019 1104   AST 20 08/23/2019 1104   ALT 18 08/23/2019 1104   ALKPHOS 74 08/23/2019 1104   BILITOT 0.3 08/23/2019 1104   GFRNONAA >60 09/11/2019 1422   GFRNONAA 76 07/27/2016 1001   GFRAA >60 09/11/2019 1422   GFRAA 87  07/27/2016 1001   Lab Results  Component Value Date   CHOL 266 (H) 08/23/2019   HDL 65 08/23/2019   LDLCALC 168 (H) 08/23/2019   LDLDIRECT 157 (H) 08/20/2008   TRIG 181 (H) 08/23/2019   CHOLHDL 4.1 08/23/2019   Lab Results  Component Value Date   HGBA1C 5.2  07/27/2016   No results found for: VITAMINB12 Lab Results  Component Value Date   TSH 2.770 08/23/2019      ASSESSMENT AND PLAN 59 y.o. year old female  has a past medical history of Adenocarcinoma in situ (AIS) of uterine cervix (05/2017), Anxiety and depression, Breast cancer ([place]) (2002), Depression, Diverticulosis of colon, Essential hypertension, benign, GERD (gastroesophageal reflux disease), Hepatitis, High grade squamous intraepithelial cervical dysplasia (05/2017), History of adenomatous polyp of colon, History of gastritis, History of left breast cancer (2002---- per pt no recurrence), History of radiation therapy, Hypothyroidism, Pain, Spinal stenosis, Spondylolisthesis, and SVD (spontaneous vaginal delivery). here with:  1.  Right trigeminal neuralgia, involving right V3 branch -MRI of the brain with and without contrast to right trigeminal ganglion was normal   I spent 15 minutes with the patient. 50% of this time was spent   Butler Denmark, [place], DNP 12/18/2019, 7:44 AM [facility] [address] [place], [place] [address] [phone]

## 2019-12-18 NOTE — Telephone Encounter (Signed)
Patient no-showed today's appointment with NP

## 2019-12-19 ENCOUNTER — Encounter: Payer: Self-pay | Admitting: Neurology

## 2019-12-19 ENCOUNTER — Ambulatory Visit: Payer: Medicaid Other | Admitting: Neurology

## 2019-12-19 ENCOUNTER — Other Ambulatory Visit: Payer: Self-pay

## 2019-12-19 VITALS — BP 133/79 | HR 73 | Temp 97.0°F | Ht 64.0 in | Wt 171.0 lb

## 2019-12-19 DIAGNOSIS — G500 Trigeminal neuralgia: Secondary | ICD-10-CM

## 2019-12-19 NOTE — Progress Notes (Signed)
PATIENT: Sherry Anderson DOB: August 19, 1960  REASON FOR VISIT: follow up HISTORY FROM: patient  HISTORY OF PRESENT ILLNESS: Today 12/19/19  HISTORY  HISTORY Sherry Gutierrezis a 59 year old female, seen in request byher primary care physician Dr.Neal, Sarafor evaluation of right jaw pain, initial evaluation was September 13, 2019.  I have reviewed and summarized the referring note from the referring physician.She has past medical history of hypertension, hypothyroidism, on supplement,  She had one episode of similar right jaw pain about 3 years ago, lasting 3 to 4 months gradually improved, around November 2020, she had recurrent right side jaw pain, radiating pain from right ear to right jaw, sharp transient electric shooting pain,'worse than the childbirth pain,"sometimes she has difficulty eating, opening her mouth, she has tried over-the-counter naproxen without helping  She denies hearing loss, denies facial weakness, denies visual loss  I personally reviewed MRI of the brain without contrast in May 2019 that was normal.  Laboratory evaluation January 2021:CMP was normal, creatinine of 0.87, normal CBC, hemoglobin of 13.4, TSH, total cholesterol of 266, LDL of 168,  Update October 16, 2019 SS: She was tried on gabapentin, was not beneficial, switched to Lyrica.  MRI of the brain with and without contrast through right trigeminal ganglion was normal.  She has complained of continued pain, Trileptal has been added 150 mg twice a day, taking Lyrica 150 mg twice a day.   Reports continued right-sided trigeminal pain, sharp, electric-like, keeping her up at night, sensitive to temperature, touch, eating. Medications seems to be helping slightly.  Update Dec 19, 2019 SS: She went to the ER 10/19/19 for trigeminal neuralgia pain, was given hydrocodone, 10 tablets.  She is currently taking oxcarbazepine 300 mg in the morning, 300 mg midday, 600 mg at bedtime, Lyrica 150 mg 3  times a day. Gabapentin was not beneficial. She has been referred to [facility] Dr. Salomon Fick, resent 12/12/2019. Her pain is still there on the right side, is somewhat better, only with medication does she notice improvement. With medications 3/10 pain level, without the medications, she is crying. She is sleeping better, occasionally will wake her up. Talking, eating, putting lotion on her face is hard for her, hard to brush her teeth. Will take hydrocodone occasionally if needed. Trileptal has been most helpful vs Lyrica.   REVIEW OF SYSTEMS: Out of a complete 14 system review of symptoms, the patient complains only of the following symptoms, and all other reviewed systems are negative.  Facial pain   ALLERGIES: No Known Allergies  HOME MEDICATIONS: Outpatient Medications Prior to Visit  Medication Sig Dispense Refill  . acetaminophen (TYLENOL) 325 MG tablet Take 650 mg by mouth every 6 (six) hours as needed. OTC PRN    . Calcium Citrate-Vitamin D (CALCIUM + D PO) Take 1 tablet by mouth 2 (two) times daily.    Marland Kitchen HYDROcodone-acetaminophen (NORCO) 5-325 MG tablet Take 1 tablet by mouth every 6 (six) hours as needed for moderate pain. 20 tablet 0  . HYDROcodone-acetaminophen (NORCO/VICODIN) 5-325 MG tablet Take 2 tablets by mouth every 4 (four) hours as needed. 10 tablet 0  . levothyroxine (SYNTHROID) 75 MCG tablet Take 1 tablet (75 mcg total) by mouth every morning. 30 minutes before food 90 tablet 0  . lisinopril (ZESTRIL) 20 MG tablet TAKE 1 TABLET BY MOUTH EVERY DAY 90 tablet 0  . omeprazole (PRILOSEC) 20 MG capsule TAKE 1 CAPSULE BY MOUTH TWICE A DAY 180 capsule 0  . Oxcarbazepine (TRILEPTAL) 300 MG tablet Take  1 in the morning, 1 midday, 2 at bedtime 120 tablet 5  . pregabalin (LYRICA) 150 MG capsule Take 1 capsule (150 mg total) by mouth 3 (three) times daily. 90 capsule 5  . triamterene-hydrochlorothiazide (DYAZIDE) 37.5-25 MG capsule TAKE 1 EACH (1 CAPSULE TOTAL) BY MOUTH DAILY. 90 capsule 0    . venlafaxine XR (EFFEXOR-XR) 75 MG 24 hr capsule TAKE 3 CAPSULES BY MOUTH EVERY DAY 270 capsule 1   No facility-administered medications prior to visit.    PAST MEDICAL HISTORY: Past Medical History:  Diagnosis Date  . Adenocarcinoma in situ (AIS) of uterine cervix 05/2017   Associated with high-grade dysplasia  . Anxiety and depression   . Breast cancer ([place]) 2002   left breast cancer   . Depression   . Diverticulosis of colon   . Essential hypertension, benign   . GERD (gastroesophageal reflux disease)   . Hepatitis    unsure of which type  . High grade squamous intraepithelial cervical dysplasia 05/2017   Associated with adenocarcinoma in situ  . History of adenomatous polyp of colon    tubular adenoma's  02-08-2017  . History of gastritis   . History of left breast cancer 2002---- per pt no recurrence   dx DCIS --- s/p  left breast lumpectomy;  per pt radiation therapy completed same year and completed antiestrogen therapy   . History of radiation therapy    2002   left breast  . Hypothyroidism   . Pain    right ear and jaw  . Spinal stenosis   . Spondylolisthesis   . SVD (spontaneous vaginal delivery)    x 1    PAST SURGICAL HISTORY: Past Surgical History:  Procedure Laterality Date  . BREAST LUMPECTOMY Left 2002   ductal CA in situ  . CERVICAL CONIZATION W/BX N/A 06/08/2017   Procedure: CONIZATION CERVIX WITH BIOPSY;  Surgeon: Anastasio Auerbach, MD;  Location: [facility];  Service: Gynecology;  Laterality: N/A;  . [place]   twins  . COLONOSCOPY  02-08-2017   dr Karleen Hampshire  . DILATATION & CURETTAGE/HYSTEROSCOPY WITH MYOSURE N/A 06/08/2017   Procedure: [place];  Surgeon: Anastasio Auerbach, MD;  Location: [facility];  Service: Gynecology;  Laterality: N/A;  . ESOPHAGOGASTRODUODENOSCOPY  last one 11-24-2016   dr Karleen Hampshire  . ROBOTIC ASSISTED TOTAL HYSTERECTOMY WITH BILATERAL  SALPINGO OOPHERECTOMY N/A 09/21/2017   Procedure: XI ROBOTIC ASSISTED TOTAL HYSTERECTOMY WITH BILATERAL SALPINGO OOPHORECTOMY, Lysis of Adhesions;  Surgeon: Princess Bruins, MD;  Location: [facility];  Service: Gynecology;  Laterality: N/A;  . TOOTH EXTRACTION    . TRANSFORAMINAL LUMBAR INTERBODY FUSION (TLIF) WITH PEDICLE SCREW FIXATION 2 LEVEL N/A 04/13/2018   Procedure: RIGHT-SIDED LUMBAR 4-5 AND LUMBAR 5-SACRUM 1 TRANSFORAMINAL LUMBAR INTERBODY FUSION WITH INSTRUMENTATION AND ALLOGRAFT;  Surgeon: Phylliss Bob, MD;  Location: [facility];  Service: Orthopedics;  Laterality: N/A;  . WISDOM TOOTH EXTRACTION      FAMILY HISTORY: Family History  Problem Relation Age of Onset  . Diabetes Mother   . Hypertension Mother   . Heart disease Father        Pacemaker  . Thyroid disease Sister   . Schizophrenia Brother   . Colon cancer Neg Hx   . Esophageal cancer Neg Hx   . Rectal cancer Neg Hx   . Stomach cancer Neg Hx   . Breast cancer Neg Hx     SOCIAL HISTORY: Social History   Socioeconomic History  .  Marital status: Legally Separated    Spouse name: Not on file  . Number of children: 3  . Years of education: 6  . Highest education level: Not on file  Occupational History  . Occupation: Disability  Tobacco Use  . Smoking status: Never Smoker  . Smokeless tobacco: Never Used  Substance and Sexual Activity  . Alcohol use: No  . Drug use: No  . Sexual activity: Not Currently    Birth control/protection: Post-menopausal    Comment: 1st intercourse 59 yo-Fewer than 5 partners  Other Topics Concern  . Not on file  Social History Narrative   Moved from [place] to [place], to [place] at home with her nephew and her son.   Right-handed.   Occasional use of caffeine.   Social Determinants of Health   Financial Resource Strain:   . Difficulty of Paying Living Expenses:   Food Insecurity:   . Worried About Charity fundraiser in the Last Year:   . Arboriculturist in the  Last Year:   Transportation Needs:   . Film/video editor (Medical):   Marland Kitchen Lack of Transportation (Non-Medical):   Physical Activity:   . Days of Exercise per Week:   . Minutes of Exercise per Session:   Stress:   . Feeling of Stress :   Social Connections:   . Frequency of Communication with Friends and Family:   . Frequency of Social Gatherings with Friends and Family:   . Attends Religious Services:   . Active Member of Clubs or Organizations:   . Attends Archivist Meetings:   Marland Kitchen Marital Status:   Intimate Partner Violence:   . Fear of Current or Ex-Partner:   . Emotionally Abused:   Marland Kitchen Physically Abused:   . Sexually Abused:    PHYSICAL EXAM  Vitals:   12/19/19 1227  BP: 133/79  Pulse: 73  Temp: (!) 97 F (36.1 C)  Weight: 171 lb (77.6 kg)  Height: 5\' 4"  (1.626 m)   Body mass index is 29.35 kg/m.  Generalized: Well developed, in no acute distress   Neurological examination  Mentation: Alert oriented to time, place, history taking. Follows all commands speech and language fluent Cranial nerve II-XII: Pupils were equal round reactive to light. Extraocular movements were full, visual field were full on confrontational test. Facial symmetry noted, hypersensitive right V3 around all.  Head turning and shoulder shrug were normal and symmetric. Motor: The motor testing reveals 5 over 5 strength of all 4 extremities. Good symmetric motor tone is noted throughout.  Sensory: Sensory testing is intact to soft touch on all 4 extremities. No evidence of extinction is noted.  Coordination: Cerebellar testing reveals good finger-nose-finger and heel-to-shin bilaterally.  Gait and station: Gait is normal, slight limp on the right Reflexes: Deep tendon reflexes are symmetric and normal bilaterally.   DIAGNOSTIC DATA (LABS, IMAGING, TESTING) - I reviewed patient records, labs, notes, testing and imaging myself where available.  Lab Results  Component Value Date   WBC  6.1 09/11/2019   HGB 13.4 09/11/2019   HCT 40.6 09/11/2019   MCV 88.3 09/11/2019   PLT 357 09/11/2019      Component Value Date/Time   NA 138 09/11/2019 1422   NA 139 08/23/2019 1104   K 3.5 09/11/2019 1422   CL 101 09/11/2019 1422   CO2 26 09/11/2019 1422   GLUCOSE 103 (H) 09/11/2019 1422   BUN 21 (H) 09/11/2019 1422  BUN 13 08/23/2019 1104   CREATININE 0.87 09/11/2019 1422   CREATININE 0.96 11/02/2017 1026   CALCIUM 9.6 09/11/2019 1422   PROT 7.8 08/23/2019 1104   ALBUMIN 4.6 08/23/2019 1104   AST 20 08/23/2019 1104   ALT 18 08/23/2019 1104   ALKPHOS 74 08/23/2019 1104   BILITOT 0.3 08/23/2019 1104   GFRNONAA >60 09/11/2019 1422   GFRNONAA 76 07/27/2016 1001   GFRAA >60 09/11/2019 1422   GFRAA 87 07/27/2016 1001   Lab Results  Component Value Date   CHOL 266 (H) 08/23/2019   HDL 65 08/23/2019   LDLCALC 168 (H) 08/23/2019   LDLDIRECT 157 (H) 08/20/2008   TRIG 181 (H) 08/23/2019   CHOLHDL 4.1 08/23/2019   Lab Results  Component Value Date   HGBA1C 5.2 07/27/2016   No results found for: VITAMINB12 Lab Results  Component Value Date   TSH 2.770 08/23/2019    ASSESSMENT AND PLAN 59 y.o. year old female  has a past medical history of Adenocarcinoma in situ (AIS) of uterine cervix (05/2017), Anxiety and depression, Breast cancer ([place]) (2002), Depression, Diverticulosis of colon, Essential hypertension, benign, GERD (gastroesophageal reflux disease), Hepatitis, High grade squamous intraepithelial cervical dysplasia (05/2017), History of adenomatous polyp of colon, History of gastritis, History of left breast cancer (2002---- per pt no recurrence), History of radiation therapy, Hypothyroidism, Pain, Spinal stenosis, Spondylolisthesis, and SVD (spontaneous vaginal delivery). here with:  1.  Right trigeminal neuralgia, involving right V3 branch -With medications pain level is 3/10, Trileptal seems to be most helpful -Will check blood work today, specifically sodium level  before making adjustment -For now, continue Trileptal 300 mg twice daily, 600 mg at bedtime (may increase to 600 mg am/300 mg midday/600 mg pm), could also consider Baclofen  -Continue Lyrica 150 mg 3 times a day -Referral to [facility] Dr. Salomon Fick has been resent 12/12/2019, will have our office check on this, patient is interested in procedure -MRI of the brain with and without contrast to right trigeminal ganglion was normal -I will see her back in 4 months, she is to reach out sooner if dose adjustment  I spent 30 minutes of face-to-face and non-face-to-face time with patient.  This included previsit chart review, lab review, study review, order entry, electronic health record documentation, patient education.  Butler Denmark, AGNP-C, DNP 12/19/2019, 12:58 PM [facility] [address] [place], [place] [address] [phone]

## 2019-12-19 NOTE — Patient Instructions (Signed)
For now, stay at same doses of medication Check blood work today  Will work on the referral to [place]  Return back in 4 months

## 2019-12-21 LAB — CBC WITH DIFFERENTIAL/PLATELET
Basophils Absolute: 0 10*3/uL (ref 0.0–0.2)
Basos: 1 %
EOS (ABSOLUTE): 0.3 10*3/uL (ref 0.0–0.4)
Eos: 5 %
Hematocrit: 33.8 % — ABNORMAL LOW (ref 34.0–46.6)
Hemoglobin: 10.4 g/dL — ABNORMAL LOW (ref 11.1–15.9)
Immature Grans (Abs): 0 10*3/uL (ref 0.0–0.1)
Immature Granulocytes: 0 %
Lymphocytes Absolute: 2.5 10*3/uL (ref 0.7–3.1)
Lymphs: 42 %
MCH: 24.6 pg — ABNORMAL LOW (ref 26.6–33.0)
MCHC: 30.8 g/dL — ABNORMAL LOW (ref 31.5–35.7)
MCV: 80 fL (ref 79–97)
Monocytes Absolute: 0.4 10*3/uL (ref 0.1–0.9)
Monocytes: 7 %
Neutrophils Absolute: 2.7 10*3/uL (ref 1.4–7.0)
Neutrophils: 45 %
Platelets: 358 10*3/uL (ref 150–450)
RBC: 4.22 x10E6/uL (ref 3.77–5.28)
RDW: 13.8 % (ref 11.7–15.4)
WBC: 5.9 10*3/uL (ref 3.4–10.8)

## 2019-12-21 LAB — COMPREHENSIVE METABOLIC PANEL
ALT: 16 IU/L (ref 0–32)
AST: 18 IU/L (ref 0–40)
Albumin/Globulin Ratio: 1.7 (ref 1.2–2.2)
Albumin: 4.5 g/dL (ref 3.8–4.9)
Alkaline Phosphatase: 74 IU/L (ref 39–117)
BUN/Creatinine Ratio: 18 (ref 9–23)
BUN: 15 mg/dL (ref 6–24)
Bilirubin Total: <0.2 mg/dL (ref 0.0–1.2)
CO2: 22 mmol/L (ref 20–29)
Calcium: 9.5 mg/dL (ref 8.7–10.2)
Chloride: 104 mmol/L (ref 96–106)
Creatinine, Ser: 0.85 mg/dL (ref 0.57–1.00)
GFR calc Af Amer: 87 mL/min/{1.73_m2} (ref >59)
GFR calc non Af Amer: 75 mL/min/{1.73_m2} (ref >59)
Globulin, Total: 2.7 g/dL (ref 1.5–4.5)
Glucose: 109 mg/dL — ABNORMAL HIGH (ref 65–99)
Potassium: 4 mmol/L (ref 3.5–5.2)
Sodium: 142 mmol/L (ref 134–144)
Total Protein: 7.2 g/dL (ref 6.0–8.5)

## 2019-12-21 LAB — 10-HYDROXYCARBAZEPINE: Oxcarbazepine SerPl-Mcnc: 3 ug/mL — ABNORMAL LOW (ref 10–35)

## 2019-12-25 ENCOUNTER — Telehealth: Payer: Self-pay | Admitting: *Deleted

## 2019-12-25 MED ORDER — OXCARBAZEPINE 300 MG PO TABS: ORAL_TABLET | ORAL | 5 refills | Status: DC

## 2019-12-25 NOTE — Telephone Encounter (Signed)
I will up date the rx to trileptal 600 mg am, 300 mg midday, 600 mg pm.

## 2019-12-25 NOTE — Telephone Encounter (Signed)
I called pt and relayed the lab results to her.  Trileptal level low, she can increase hte trileptal to 600mg  am, 300mg  midday and 600mg  qhs.  She stated had bad night last night.  She will to the increase.  Verbalized understanding of results.  Her appt with Dr. Salomon Fick is 01-21-20.  She will call and see if any cancellations.

## 2019-12-25 NOTE — Progress Notes (Signed)
I have reviewed and agreed above plan. 

## 2019-12-25 NOTE — Telephone Encounter (Signed)
-----   Message from Suzzanne Cloud, NP sent at 12/24/2019 12:42 PM EDT ----- Labs overall stable. Trileptal level is low, 3, but she is taking for TN pain. Is currently taking 300 mg twice daily, 600 mg at bedtime. If needed, she can increase to 600 mg am, 300 mg midday, 600 mg at bedtime. Check on the status of her referral to Dr. Salomon Fick, if she has heard anything.

## 2019-12-25 NOTE — Addendum Note (Signed)
Addended by: Suzzanne Cloud on: 12/25/2019 11:37 AM   Modules accepted: Orders

## 2020-01-21 DIAGNOSIS — G500 Trigeminal neuralgia: Secondary | ICD-10-CM | POA: Diagnosis not present

## 2020-01-22 DIAGNOSIS — R9431 Abnormal electrocardiogram [ECG] [EKG]: Secondary | ICD-10-CM | POA: Diagnosis not present

## 2020-01-25 DIAGNOSIS — Z8659 Personal history of other mental and behavioral disorders: Secondary | ICD-10-CM | POA: Diagnosis not present

## 2020-01-25 DIAGNOSIS — G500 Trigeminal neuralgia: Secondary | ICD-10-CM | POA: Diagnosis not present

## 2020-01-25 DIAGNOSIS — Z8679 Personal history of other diseases of the circulatory system: Secondary | ICD-10-CM | POA: Diagnosis not present

## 2020-01-25 HISTORY — PX: BRAIN SURGERY: SHX531

## 2020-01-31 DIAGNOSIS — R0683 Snoring: Secondary | ICD-10-CM | POA: Diagnosis not present

## 2020-01-31 DIAGNOSIS — G4719 Other hypersomnia: Secondary | ICD-10-CM | POA: Diagnosis not present

## 2020-01-31 DIAGNOSIS — G478 Other sleep disorders: Secondary | ICD-10-CM | POA: Diagnosis not present

## 2020-01-31 DIAGNOSIS — G4700 Insomnia, unspecified: Secondary | ICD-10-CM | POA: Diagnosis not present

## 2020-01-31 DIAGNOSIS — I10 Essential (primary) hypertension: Secondary | ICD-10-CM | POA: Diagnosis not present

## 2020-01-31 DIAGNOSIS — R0989 Other specified symptoms and signs involving the circulatory and respiratory systems: Secondary | ICD-10-CM | POA: Diagnosis not present

## 2020-02-04 ENCOUNTER — Telehealth: Payer: Self-pay | Admitting: Neurology

## 2020-02-04 NOTE — Telephone Encounter (Signed)
Received report from Dr. Salomon Fick, felt to be a good candidate for microvascular decompression. Seems to be working with surgical scheduler.

## 2020-02-05 ENCOUNTER — Emergency Department (HOSPITAL_COMMUNITY)
Admission: EM | Admit: 2020-02-05 | Discharge: 2020-02-05 | Disposition: A | Discharge status: Home / Self Care | Payer: Medicaid Other | Attending: Emergency Medicine | Admitting: Emergency Medicine

## 2020-02-05 ENCOUNTER — Encounter (HOSPITAL_COMMUNITY): Payer: Self-pay | Admitting: Emergency Medicine

## 2020-02-05 ENCOUNTER — Emergency Department (HOSPITAL_COMMUNITY): Payer: Medicaid Other

## 2020-02-05 ENCOUNTER — Other Ambulatory Visit: Payer: Self-pay

## 2020-02-05 DIAGNOSIS — R519 Headache, unspecified: Secondary | ICD-10-CM | POA: Insufficient documentation

## 2020-02-05 DIAGNOSIS — I10 Essential (primary) hypertension: Secondary | ICD-10-CM | POA: Diagnosis not present

## 2020-02-05 DIAGNOSIS — Z79899 Other long term (current) drug therapy: Secondary | ICD-10-CM | POA: Diagnosis not present

## 2020-02-05 DIAGNOSIS — G510 Bell's palsy: Secondary | ICD-10-CM | POA: Insufficient documentation

## 2020-02-05 DIAGNOSIS — I6200 Nontraumatic subdural hemorrhage, unspecified: Secondary | ICD-10-CM | POA: Diagnosis not present

## 2020-02-05 DIAGNOSIS — S065X9A Traumatic subdural hemorrhage with loss of consciousness of unspecified duration, initial encounter: Secondary | ICD-10-CM

## 2020-02-05 DIAGNOSIS — Z853 Personal history of malignant neoplasm of breast: Secondary | ICD-10-CM | POA: Diagnosis not present

## 2020-02-05 DIAGNOSIS — E039 Hypothyroidism, unspecified: Secondary | ICD-10-CM | POA: Diagnosis not present

## 2020-02-05 DIAGNOSIS — Z8541 Personal history of malignant neoplasm of cervix uteri: Secondary | ICD-10-CM | POA: Diagnosis not present

## 2020-02-05 DIAGNOSIS — I6201 Nontraumatic acute subdural hemorrhage: Secondary | ICD-10-CM | POA: Insufficient documentation

## 2020-02-05 DIAGNOSIS — R29810 Facial weakness: Secondary | ICD-10-CM | POA: Diagnosis present

## 2020-02-05 DIAGNOSIS — S065X0A Traumatic subdural hemorrhage without loss of consciousness, initial encounter: Secondary | ICD-10-CM | POA: Diagnosis not present

## 2020-02-05 DIAGNOSIS — Z9889 Other specified postprocedural states: Secondary | ICD-10-CM | POA: Insufficient documentation

## 2020-02-05 DIAGNOSIS — S065XAA Traumatic subdural hemorrhage with loss of consciousness status unknown, initial encounter: Secondary | ICD-10-CM

## 2020-02-05 LAB — COMPREHENSIVE METABOLIC PANEL
ALT: 18 U/L (ref 0–44)
AST: 18 U/L (ref 15–41)
Albumin: 3.7 g/dL (ref 3.5–5.0)
Alkaline Phosphatase: 60 U/L (ref 38–126)
Anion gap: 12 (ref 5–15)
BUN: 13 mg/dL (ref 6–20)
CO2: 23 mmol/L (ref 22–32)
Calcium: 9.3 mg/dL (ref 8.9–10.3)
Chloride: 99 mmol/L (ref 98–111)
Creatinine, Ser: 0.99 mg/dL (ref 0.44–1.00)
GFR calc Af Amer: >60 mL/min (ref >60)
GFR calc non Af Amer: >60 mL/min (ref >60)
Glucose, Bld: 145 mg/dL — ABNORMAL HIGH (ref 70–99)
Potassium: 4.2 mmol/L (ref 3.5–5.1)
Sodium: 134 mmol/L — ABNORMAL LOW (ref 135–145)
Total Bilirubin: 0.3 mg/dL (ref 0.3–1.2)
Total Protein: 7.5 g/dL (ref 6.5–8.1)

## 2020-02-05 LAB — CBC
HCT: 34 % — ABNORMAL LOW (ref 36.0–46.0)
Hemoglobin: 10.4 g/dL — ABNORMAL LOW (ref 12.0–15.0)
MCH: 24.1 pg — ABNORMAL LOW (ref 26.0–34.0)
MCHC: 30.6 g/dL (ref 30.0–36.0)
MCV: 78.7 fL — ABNORMAL LOW (ref 80.0–100.0)
Platelets: 377 10*3/uL (ref 150–400)
RBC: 4.32 MIL/uL (ref 3.87–5.11)
RDW: 15.8 % — ABNORMAL HIGH (ref 11.5–15.5)
WBC: 7.9 10*3/uL (ref 4.0–10.5)
nRBC: 0 % (ref 0.0–0.2)

## 2020-02-05 LAB — DIFFERENTIAL
Abs Immature Granulocytes: 0.11 10*3/uL — ABNORMAL HIGH (ref 0.00–0.07)
Basophils Absolute: 0.1 10*3/uL (ref 0.0–0.1)
Basophils Relative: 1 %
Eosinophils Absolute: 0.6 10*3/uL — ABNORMAL HIGH (ref 0.0–0.5)
Eosinophils Relative: 8 %
Immature Granulocytes: 1 %
Lymphocytes Relative: 22 %
Lymphs Abs: 1.7 10*3/uL (ref 0.7–4.0)
Monocytes Absolute: 0.6 10*3/uL (ref 0.1–1.0)
Monocytes Relative: 8 %
Neutro Abs: 4.8 10*3/uL (ref 1.7–7.7)
Neutrophils Relative %: 60 %

## 2020-02-05 MED ORDER — DEXAMETHASONE 4 MG PO TABS
4.0000 mg | ORAL_TABLET | Freq: Four times a day (QID) | ORAL | 0 refills | Status: DC
Start: 2020-02-05 — End: 2020-04-23

## 2020-02-05 MED ORDER — METOCLOPRAMIDE HCL 5 MG/ML IJ SOLN
10.0000 mg | Freq: Once | INTRAMUSCULAR | Status: AC
  Administered 2020-02-05: 10 mg via INTRAVENOUS
  Filled 2020-02-05: qty 2

## 2020-02-05 MED ORDER — MORPHINE SULFATE (PF) 4 MG/ML IV SOLN
4.0000 mg | Freq: Once | INTRAVENOUS | Status: AC
  Administered 2020-02-05: 4 mg via INTRAVENOUS
  Filled 2020-02-05: qty 1

## 2020-02-05 MED ORDER — DIPHENHYDRAMINE HCL 50 MG/ML IJ SOLN
25.0000 mg | Freq: Once | INTRAMUSCULAR | Status: AC
  Administered 2020-02-05: 25 mg via INTRAVENOUS
  Filled 2020-02-05: qty 1

## 2020-02-05 MED ORDER — DEXAMETHASONE SODIUM PHOSPHATE 10 MG/ML IJ SOLN
10.0000 mg | Freq: Once | INTRAMUSCULAR | Status: AC
  Administered 2020-02-05: 10 mg via INTRAVENOUS
  Filled 2020-02-05: qty 1

## 2020-02-05 MED ORDER — SODIUM CHLORIDE 0.9% FLUSH
3.0000 mL | Freq: Once | INTRAVENOUS | Status: AC
  Administered 2020-02-05: 3 mL via INTRAVENOUS

## 2020-02-05 NOTE — ED Triage Notes (Addendum)
Patient arrives to ED with complaints of right sided facal/jaw pain that worsened yesterday. Patient has hx of trigeminal neuralgia and is taking Trileptal. Today patient states the pain has intensified even more and she woke up to the right side of her face drooping. Patient states when she woke up this morning she noticed the ride side of her face was not moving the way it was supposed to. Patient has no weakness, speech or vision abnormalities. LKN was last night.

## 2020-02-05 NOTE — ED Provider Notes (Signed)
[facility] DE[facility] Provider Note   CSN: [id number] Arrival date & time: 02/05/20  1042     History Chief Complaint  Patient presents with  . Trigeminal Neuralgia  . Facial Droop    Sherry Anderson is a 59 y.o. female hx of hypertension, trigeminal neuralgia status post decompression surgery at [facility] (POD #11) who presenting with headache and right facial droop.  Patient states that she had surgery done at [facility] for severe pain due to trigeminal neuralgia.  She states that she is taking her Trileptal and her hydrocodone.  She also has a known small 4 mm aneurysm on the left side that was not operated on at that time.  Patient states that she has worsening headaches as well as she noticed a right facial droop today.  She is scheduled to have a outpatient MRI of her brain at [facility] today but came here after she called her doctor.  No history of stroke in the past.  The history is provided by the patient.       Past Medical History:  Diagnosis Date  . Adenocarcinoma in situ (AIS) of uterine cervix 05/2017   Associated with high-grade dysplasia  . Anxiety and depression   . Breast cancer ([place]) 2002   left breast cancer   . Depression   . Diverticulosis of colon   . Essential hypertension, benign   . GERD (gastroesophageal reflux disease)   . Hepatitis    unsure of which type  . High grade squamous intraepithelial cervical dysplasia 05/2017   Associated with adenocarcinoma in situ  . History of adenomatous polyp of colon    tubular adenoma's  02-08-2017  . History of gastritis   . History of left breast cancer 2002---- per pt no recurrence   dx DCIS --- s/p  left breast lumpectomy;  per pt radiation therapy completed same year and completed antiestrogen therapy   . History of radiation therapy    2002   left breast  . Hypothyroidism   . Pain    right ear and jaw  . Spinal stenosis   . Spondylolisthesis   . SVD (spontaneous vaginal  delivery)    x 1    Patient Active Problem List   Diagnosis Date Noted  . Syncope 09/24/2019  . Right trigeminal neuralgia 09/13/2019  . GERD (gastroesophageal reflux disease) 09/28/2018  . Dyspnea 09/28/2018  . Dental caries 06/21/2018  . Radiculopathy 04/13/2018  . Subclinical hypothyroidism 11/18/2017  . Atypical cervical glandular cells 04/21/2017  . Right-sided face pain 03/02/2017  . Tremor of right hand 11/07/2015  . Right-sided sensorineural hearing loss 11/13/2014  . DDD (degenerative disc disease), lumbar 10/21/2014  . Knee pain, bilateral 11/06/2013  . Left wrist pain 03/02/2013  . Anemia 01/25/2012  . Right ear pain 01/19/2012  . HEADACHE 02/04/2010  . Hyperlipidemia 08/07/2009  . HEARING LOSS, RIGHT EAR 01/17/2008  . CARPAL TUNNEL SYNDROME, BILATERAL 07/31/2007  . LATERAL EPICONDYLITIS, BILATERAL 07/18/2007  . Fatigue due to depression 04/06/2007  . Gastritis and gastroduodenitis 11/21/2006  . Mood disorder ([place]) 10/17/2006  . HYPERTENSION, BENIGN ESSENTIAL 10/17/2006  . CA IN SITU, BREAST 02/15/2001    Past Surgical History:  Procedure Laterality Date  . BREAST LUMPECTOMY Left 2002   ductal CA in situ  . CERVICAL CONIZATION W/BX N/A 06/08/2017   Procedure: CONIZATION CERVIX WITH BIOPSY;  Surgeon: Anastasio Auerbach, MD;  Location: [facility];  Service: Gynecology;  Laterality: N/A;  . CESAREAN SECTION  1990   twins  . COLONOSCOPY  02-08-2017   dr Karleen [place]  . DILATATION & CURETTAGE/HYSTEROSCOPY WITH MYOSURE N/A 06/08/2017   Procedure: [place];  Surgeon: Anastasio Auerbach, MD;  Location: [facility];  Service: Gynecology;  Laterality: N/A;  . ESOPHAGOGASTRODUODENOSCOPY  last one 11-24-2016   dr Karleen [place]  . ROBOTIC ASSISTED TOTAL HYSTERECTOMY WITH BILATERAL SALPINGO OOPHERECTOMY N/A 09/21/2017   Procedure: XI ROBOTIC ASSISTED TOTAL HYSTERECTOMY WITH BILATERAL SALPINGO OOPHORECTOMY, Lysis  of Adhesions;  Surgeon: Princess Bruins, MD;  [facility]: [facility];  Service: Gynecology;  Laterality: N/A;  . TOOTH EXTRACTION    . TRANSFORAMINAL LUMBAR INTERBODY FUSION (TLIF) WITH PEDICLE SCREW FIXATION 2 LEVEL N/A 04/13/2018   Procedure: RIGHT-SIDED LUMBAR 4-5 AND LUMBAR 5-SACRUM 1 TRANSFORAMINAL LUMBAR INTERBODY FUSION WITH INSTRUMENTATION AND ALLOGRAFT;  Surgeon: Phylliss Bob, MD;  Location: [facility];  Service: Orthopedics;  Laterality: N/A;  . WISDOM TOOTH EXTRACTION       OB History    Gravida  2   Para      Term      Preterm      AB      Living  3     SAB      TAB      Ectopic      Multiple  1   Live Births              Family History  Problem Relation Age of Onset  . Diabetes Mother   . Hypertension Mother   . Heart disease Father        Pacemaker  . Thyroid disease Sister   . Schizophrenia Brother   . Colon cancer Neg Hx   . Esophageal cancer Neg Hx   . Rectal cancer Neg Hx   . Stomach cancer Neg Hx   . Breast cancer Neg Hx     Social History   Tobacco Use  . Smoking status: Never Smoker  . Smokeless tobacco: Never Used  Vaping Use  . Vaping Use: Never used  Substance Use Topics  . Alcohol use: No  . Drug use: No    Home Medications Prior to Admission medications   Medication Sig Start Date End Date Taking? Authorizing Provider  Calcium Citrate-Vitamin D (CALCIUM + D PO) Take 1 tablet by mouth 2 (two) times daily.   Yes [provider]  acetaminophen (TYLENOL) 325 MG tablet Take 650 mg by mouth every 6 (six) hours as needed. OTC PRN    [provider]  HYDROcodone-acetaminophen (NORCO) 5-325 MG tablet Take 1 tablet by mouth every 6 (six) hours as needed for moderate pain. 10/22/19   Suzzanne Cloud, NP  HYDROcodone-acetaminophen (NORCO/VICODIN) 5-325 MG tablet Take 2 tablets by mouth every 4 (four) hours as needed. 10/19/19   Fransico Meadow, PA-C  levothyroxine (SYNTHROID) 75 MCG tablet Take 1 tablet (75 mcg total) by mouth  every morning. 30 minutes before food 08/08/19   Leeanne Rio, MD  lisinopril (ZESTRIL) 20 MG tablet TAKE 1 TABLET BY MOUTH EVERY DAY 08/13/19   Lind Covert, MD  omeprazole (PRILOSEC) 20 MG capsule TAKE 1 CAPSULE BY MOUTH TWICE A DAY 10/22/19   Leeanne Rio, MD  Oxcarbazepine (TRILEPTAL) 300 MG tablet Take 2 in the morning, 1 midday, 2 at bedtime 12/25/19   Suzzanne Cloud, NP  pregabalin (LYRICA) 150 MG capsule Take 1 capsule (150 mg total) by mouth 3 (three) times daily. 09/17/19   Krista Blue,  Yijun, MD  triamterene-hydrochlorothiazide (DYAZIDE) 37.5-25 MG capsule TAKE 1 EACH (1 CAPSULE TOTAL) BY MOUTH DAILY. 11/09/19   Lind Covert, MD  venlafaxine XR (EFFEXOR-XR) 75 MG 24 hr capsule TAKE 3 CAPSULES BY MOUTH EVERY DAY 09/26/19   Leeanne Rio, MD    Allergies    Patient has no known allergies.  Review of Systems   Review of Systems  Neurological: Positive for facial asymmetry and headaches.  All other systems reviewed and are negative.   Physical Exam Updated Vital Signs BP 134/70   Pulse 67   Temp 99.1 F (37.3 C) (Oral)   Resp (!) 24   LMP  (LMP Unknown)   SpO2 99%   Physical Exam Vitals and nursing note reviewed.  Constitutional:      Comments: Uncomfortable   HENT:     Head: Normocephalic.     Comments: Trigeminal neuralgia surgery on the right side with some local swelling and no obvious purulent discharge     Nose: Nose normal.     Mouth/Throat:     Mouth: Mucous membranes are moist.  Eyes:     Extraocular Movements: Extraocular movements intact.     Pupils: Pupils are equal, round, and reactive to light.  Cardiovascular:     Rate and Rhythm: Normal rate and regular rhythm.     Pulses: Normal pulses.  Pulmonary:     Effort: Pulmonary effort is normal.  Abdominal:     General: Abdomen is flat.  Musculoskeletal:        General: Normal range of motion.     Cervical back: Normal range of motion.  Skin:    General: Skin is warm.      Capillary Refill: Capillary refill takes less than 2 seconds.  Neurological:     General: No focal deficit present.     Mental Status: She is alert and oriented to person, place, and time.     Comments: Obvious R bell's palsy. Nl strength and sensation bilateral upper and lower extremities. Nl finger to nose bilaterally   Psychiatric:        Mood and Affect: Mood normal.        Behavior: Behavior normal.     ED Results / Procedures / Treatments   Labs (all labs ordered are listed, but only abnormal results are displayed) Labs Reviewed  CBC - Abnormal; Notable for the following components:      Result Value   Hemoglobin 10.4 (*)    HCT 34.0 (*)    MCV 78.7 (*)    MCH 24.1 (*)    RDW 15.8 (*)    All other components within normal limits  DIFFERENTIAL - Abnormal; Notable for the following components:   Eosinophils Absolute 0.6 (*)    Abs Immature Granulocytes 0.11 (*)    All other components within normal limits  COMPREHENSIVE METABOLIC PANEL - Abnormal; Notable for the following components:   Sodium 134 (*)    Glucose, Bld 145 (*)    All other components within normal limits  CBG MONITORING, ED    EKG None  Radiology No results found.  Procedures Procedures (including critical care time)  Medications Ordered in ED Medications  sodium chloride flush (NS) 0.9 % injection 3 mL (has no administration in time range)  metoCLOPramide (REGLAN) injection 10 mg (has no administration in time range)  diphenhydrAMINE (BENADRYL) injection 25 mg (has no administration in time range)  morphine 4 MG/ML injection 4 mg (has no administration in time  range)  dexamethasone (DECADRON) injection 10 mg (has no administration in time range)    ED Course  I have reviewed the triage vital signs and the nursing notes.  Pertinent labs & imaging results that were available during my care of the patient were reviewed by me and considered in my medical decision making (see chart for  details).    MDM Rules/Calculators/A&P                          S[facility] Anderson is a 59 y.o. female here presenting with right facial droop.  Patient just had decompression surgery for right trigeminal neuralgia.  Patient has obvious Bell's palsy on exam.  The surgery was done [facility].  I discussed with neurosurgery NP on-call, [facility] Anderson.  He thinks it is likely some swelling around the facial nerve after the surgery. He agreed with IV steroids and MRI brain.  Does not need MRI of the temporal bone currently.  11:02 PM MRI showed subdural hematoma that is subacute.  I discussed with [facility] Anderson from neurosurgery again.  He reviewed the MRI and felt that is subacute and can be a complication of the recent surgery.  He recommend Decadron 4 mg every 6 hours.  She has follow-up with her surgeon in 2 days and I encouraged her to call her neurosurgeon tomorrow.  I also recommend some eyedrops and eye shield at night to prevent the eye from drying up.  Final Clinical Impression(s) / ED Diagnoses Final diagnoses:  None    Rx / DC Orders ED Discharge Orders    None       Drenda Freeze, MD 02/05/20 2304

## 2020-02-05 NOTE — Discharge Instructions (Addendum)
You have a fluid collection after the surgery.   Continue your current pain meds   Use eye drops during the day and eye shield at night to prevent your eye from drying out   Please call your neurosurgeon tomorrow and let them know about the results   Return to ER if you have worse headaches, worse facial numbness or weakness, trouble speaking

## 2020-02-05 NOTE — ED Notes (Signed)
Attempted IV x2, pt states that she often has trouble with IV access. L arm restricted. IV team consulted

## 2020-02-07 DIAGNOSIS — G510 Bell's palsy: Secondary | ICD-10-CM | POA: Diagnosis not present

## 2020-02-07 DIAGNOSIS — G96198 Other disorders of meninges, not elsewhere classified: Secondary | ICD-10-CM | POA: Diagnosis not present

## 2020-02-07 DIAGNOSIS — G500 Trigeminal neuralgia: Secondary | ICD-10-CM | POA: Diagnosis not present

## 2020-02-07 DIAGNOSIS — Z48811 Encounter for surgical aftercare following surgery on the nervous system: Secondary | ICD-10-CM | POA: Diagnosis not present

## 2020-02-08 ENCOUNTER — Encounter: Payer: Self-pay | Admitting: Physician Assistant

## 2020-02-08 DIAGNOSIS — G96198 Other disorders of meninges, not elsewhere classified: Secondary | ICD-10-CM | POA: Diagnosis not present

## 2020-02-12 DIAGNOSIS — G9608 Other cranial cerebrospinal fluid leak: Secondary | ICD-10-CM | POA: Diagnosis not present

## 2020-02-12 DIAGNOSIS — G932 Benign intracranial hypertension: Secondary | ICD-10-CM | POA: Diagnosis not present

## 2020-02-12 DIAGNOSIS — Z01818 Encounter for other preprocedural examination: Secondary | ICD-10-CM | POA: Diagnosis not present

## 2020-02-12 DIAGNOSIS — G96198 Other disorders of meninges, not elsewhere classified: Secondary | ICD-10-CM | POA: Diagnosis not present

## 2020-02-12 HISTORY — PX: CRANIECTOMY: SHX331

## 2020-02-13 ENCOUNTER — Telehealth: Payer: Self-pay | Admitting: Neurology

## 2020-02-13 DIAGNOSIS — G96198 Other disorders of meninges, not elsewhere classified: Secondary | ICD-10-CM | POA: Diagnosis not present

## 2020-02-13 DIAGNOSIS — Z982 Presence of cerebrospinal fluid drainage device: Secondary | ICD-10-CM | POA: Diagnosis not present

## 2020-02-13 NOTE — Telephone Encounter (Addendum)
I reviewed feedback from Dr. Angelene Giovanni on January 21, 2020, patient is a good candidate for microvascular decompression because of her age, health, and the likely presence of a compressive vessel, and is greater likelihood of durability, she is to proceed with microvascular decompression  Right suboccipital craniotomy on January 25, 2020 by Dr. Angelene Giovanni for migraine vascular decompression of the trigeminal nerve, description of classic findings of the bifurcating superior cerebellar artery prominently compressing the nerve identified and repositioned, several compressive veins were also sacrificed,  Plan to bring her trigeminal neuralgia medication, suture removal in 12 to 14 days

## 2020-02-20 DIAGNOSIS — G510 Bell's palsy: Secondary | ICD-10-CM | POA: Diagnosis not present

## 2020-02-21 DIAGNOSIS — Z4802 Encounter for removal of sutures: Secondary | ICD-10-CM | POA: Diagnosis not present

## 2020-02-21 DIAGNOSIS — Z982 Presence of cerebrospinal fluid drainage device: Secondary | ICD-10-CM | POA: Diagnosis not present

## 2020-02-21 DIAGNOSIS — Z5189 Encounter for other specified aftercare: Secondary | ICD-10-CM | POA: Diagnosis not present

## 2020-02-28 DIAGNOSIS — G9782 Other postprocedural complications and disorders of nervous system: Secondary | ICD-10-CM | POA: Diagnosis not present

## 2020-02-28 DIAGNOSIS — G96198 Other disorders of meninges, not elsewhere classified: Secondary | ICD-10-CM | POA: Diagnosis not present

## 2020-02-28 DIAGNOSIS — Z48811 Encounter for surgical aftercare following surgery on the nervous system: Secondary | ICD-10-CM | POA: Diagnosis not present

## 2020-03-04 DIAGNOSIS — Z982 Presence of cerebrospinal fluid drainage device: Secondary | ICD-10-CM | POA: Diagnosis not present

## 2020-03-04 DIAGNOSIS — G96198 Other disorders of meninges, not elsewhere classified: Secondary | ICD-10-CM | POA: Diagnosis not present

## 2020-03-04 DIAGNOSIS — E871 Hypo-osmolality and hyponatremia: Secondary | ICD-10-CM | POA: Diagnosis not present

## 2020-03-04 DIAGNOSIS — Z48811 Encounter for surgical aftercare following surgery on the nervous system: Secondary | ICD-10-CM | POA: Diagnosis not present

## 2020-03-04 DIAGNOSIS — R519 Headache, unspecified: Secondary | ICD-10-CM | POA: Diagnosis not present

## 2020-03-06 DIAGNOSIS — H04123 Dry eye syndrome of bilateral lacrimal glands: Secondary | ICD-10-CM | POA: Diagnosis not present

## 2020-03-06 DIAGNOSIS — H0223A Paralytic lagophthalmos right eye, upper and lower eyelids: Secondary | ICD-10-CM | POA: Diagnosis not present

## 2020-03-06 DIAGNOSIS — G519 Disorder of facial nerve, unspecified: Secondary | ICD-10-CM | POA: Diagnosis not present

## 2020-03-06 DIAGNOSIS — G509 Disorder of trigeminal nerve, unspecified: Secondary | ICD-10-CM | POA: Diagnosis not present

## 2020-03-11 DIAGNOSIS — G510 Bell's palsy: Secondary | ICD-10-CM | POA: Diagnosis not present

## 2020-03-11 DIAGNOSIS — R51 Headache with orthostatic component, not elsewhere classified: Secondary | ICD-10-CM | POA: Diagnosis not present

## 2020-03-11 DIAGNOSIS — Z982 Presence of cerebrospinal fluid drainage device: Secondary | ICD-10-CM | POA: Diagnosis not present

## 2020-03-24 DIAGNOSIS — Z982 Presence of cerebrospinal fluid drainage device: Secondary | ICD-10-CM | POA: Diagnosis not present

## 2020-03-24 DIAGNOSIS — G96198 Other disorders of meninges, not elsewhere classified: Secondary | ICD-10-CM | POA: Diagnosis not present

## 2020-03-27 DIAGNOSIS — M79662 Pain in left lower leg: Secondary | ICD-10-CM | POA: Diagnosis not present

## 2020-04-04 ENCOUNTER — Other Ambulatory Visit: Payer: Self-pay | Admitting: Family Medicine

## 2020-04-22 NOTE — Progress Notes (Signed)
SUBJECTIVE:   CHIEF COMPLAINT / HPI: Post-op follow up   S[facility] Anderson is a 59 year old female presenting for surgery follow-up and to discuss the following:  Craniotomy and VP shunt placement: Craniotomy with microvascular decompression performed on 6/11 for chronic relapsing trigeminal neuralgia.  Shortly afterwards she experienced leakage from the area with right-sided Bell's palsy and developed pseudomeningocele, then had a VP shunt placed on the left on 6/29.  Done by [facility] neurosurgery.  She also had an infection at the wound site requiring antibiotics.  She last saw her neurosurgeon on 8/9 for follow-up and stated to come back in 2 years at that time.  Not having any further headaches, remnant Bell's palsy, or neuralgia.  Fatigue: Present since her surgeries and not getting better.  She is also felt more depressed given how many difficulties she has had with this surgery.  Feels tired with low energy when she is trying to do activities.  Did notice her pulse was 121 yesterday.  Not having any difficulty breathing, hot/cold flashes, chest pain, palpitations, orthopnea, lower leg swelling.  Was diagnosed with a left DVT 1 month ago due to calve pain/swelling that has now resolved and has been on Eliquis twice daily since that time with compliance.  Does not follow with a therapist and has been taking Effexor for a long time.  Reports her children as her support.  Does not have much of an appetite.    Office Visit from 04/23/2020 in [facility] Office Visit from 09/24/2019 in [facility] Office Visit from 09/04/2019 in [facility]  Thoughts that you would be better off dead, or of hurting yourself in some way Not at all Not at all Not at all  PHQ-9 Total Score 17 10 9       PERTINENT  PMH / PSH: HTN, GERD, trigeminal neuralgia s/p decompression, mood disorder  OBJECTIVE:   BP 110/75    Pulse 85 (Initially 110 on arrival)   Ht  5\' 4"  (1.626 m)    Wt 162 lb 6.4 oz (73.7 kg)    LMP  (LMP Unknown)    SpO2 99%    BMI 27.88 kg/m   General: Alert, NAD HEENT: NCAT, MMM, vertical scar along right temporal bone approximately 5-6 cm in length appears well-healed without any surrounding erythema or drainage, additionally note bump around left temporal region with VP shunt in place Cardiac: RRR no m/g/r appreciated Lungs: Clear bilaterally, no increased WOB on room air saturating 99%, speaking in full sentences without concern Msk: Moves all extremities spontaneously  Ext: Warm, dry, 2+ distal pulses, no edema bilaterally Psych: Depressed mood and affect  EKG performed in the office showing heart rate of 85 bpm with normal sinus rhythm.  Q waves present in lead III only, consistent with previous EKGs.  No PR interval increase.  QTc WNL.  EKG similar to previous.  ASSESSMENT/PLAN:   Fatigue due to depression Acute on chronic for the past several months since surgery.  Likely multifactorial, however suspect her underlying worsening depression is playing a large role in addition to fatigue related to surgical recovery as expected.  EKG reassuring and similar to previous without arrhythmia.  Considered PE due to known DVT, however given duration of symptoms without any difficulty breathing or hypoxia suspect this is much less likely.  Will obtain repeat TSH, BMP, A1c, CBC to rule out further causes as given recent stressors and medication changes.  Provided  counseling resources in the community to establish care, will continue Effexor as is for now.   Dvt femoral (deep venous thrombosis) (HCC) Diagnosed via U/S on 8/12 after increased calf pain/swelling.  Eliquis 5mg  BID through her neurosurgeon.  Denies any concern for bleeding.    Right trigeminal neuralgia S/p craniotomy decompression and VP shunt in 01/2020.  Appears to be recovering from a neurological standpoint well.  Follow-up with neurosurgery as scheduled.    Recommended  following up in 2 weeks with Dr. Ardelia Mems for the above or sooner if needed, RTC/ED precautions discussed if any difficulty breathing, chest pain, or palpitations.  Patriciaann Clan, [place]

## 2020-04-23 ENCOUNTER — Ambulatory Visit: Payer: Medicaid Other | Admitting: Family Medicine

## 2020-04-23 ENCOUNTER — Encounter: Payer: Self-pay | Admitting: Family Medicine

## 2020-04-23 ENCOUNTER — Ambulatory Visit (HOSPITAL_COMMUNITY)
Admission: RE | Admit: 2020-04-23 | Discharge: 2020-04-23 | Disposition: A | Discharge status: Home / Self Care | Payer: Medicaid Other | Source: Ambulatory Visit | Attending: Family Medicine | Admitting: Family Medicine

## 2020-04-23 ENCOUNTER — Other Ambulatory Visit: Payer: Self-pay

## 2020-04-23 VITALS — BP 110/75 | HR 85 | Ht 64.0 in | Wt 162.4 lb

## 2020-04-23 DIAGNOSIS — F329 Major depressive disorder, single episode, unspecified: Secondary | ICD-10-CM | POA: Diagnosis not present

## 2020-04-23 DIAGNOSIS — I10 Essential (primary) hypertension: Secondary | ICD-10-CM | POA: Diagnosis not present

## 2020-04-23 DIAGNOSIS — I471 Supraventricular tachycardia: Secondary | ICD-10-CM | POA: Diagnosis not present

## 2020-04-23 DIAGNOSIS — F32A Depression, unspecified: Secondary | ICD-10-CM

## 2020-04-23 DIAGNOSIS — Z79899 Other long term (current) drug therapy: Secondary | ICD-10-CM | POA: Diagnosis not present

## 2020-04-23 DIAGNOSIS — I82412 Acute embolism and thrombosis of left femoral vein: Secondary | ICD-10-CM | POA: Diagnosis not present

## 2020-04-23 DIAGNOSIS — R5383 Other fatigue: Secondary | ICD-10-CM | POA: Insufficient documentation

## 2020-04-23 DIAGNOSIS — F488 Other specified nonpsychotic mental disorders: Secondary | ICD-10-CM | POA: Diagnosis not present

## 2020-04-23 DIAGNOSIS — G500 Trigeminal neuralgia: Secondary | ICD-10-CM

## 2020-04-23 DIAGNOSIS — I82419 Acute embolism and thrombosis of unspecified femoral vein: Secondary | ICD-10-CM | POA: Insufficient documentation

## 2020-04-23 DIAGNOSIS — Z982 Presence of cerebrospinal fluid drainage device: Secondary | ICD-10-CM | POA: Diagnosis not present

## 2020-04-23 LAB — POCT GLYCOSYLATED HEMOGLOBIN (HGB A1C): Hemoglobin A1C: 5.6 % (ref 4.0–5.6)

## 2020-04-23 NOTE — Assessment & Plan Note (Signed)
Diagnosed via U/S on 8/12 after increased calf pain/swelling.  Eliquis 5mg  BID through her neurosurgeon.  Denies any concern for bleeding.

## 2020-04-23 NOTE — Assessment & Plan Note (Addendum)
Acute on chronic for the past several months since surgery.  Likely multifactorial, however suspect her underlying worsening depression is playing a large role in addition to fatigue related to surgical recovery as expected.  EKG reassuring and similar to previous without arrhythmia.  Considered PE due to known DVT, however given duration of symptoms without any difficulty breathing or hypoxia suspect this is much less likely.  Will obtain repeat TSH, BMP, A1c, CBC to rule out further causes as given recent stressors and medication changes.  Provided counseling resources in the community to establish care, will continue Effexor as is for now.

## 2020-04-23 NOTE — Assessment & Plan Note (Signed)
S/p craniotomy decompression and VP shunt in 01/2020.  Appears to be recovering from a neurological standpoint well.  Follow-up with neurosurgery as scheduled.

## 2020-04-23 NOTE — Patient Instructions (Signed)
It was wonderful to see you today.  We are going to check your labs to make sure that nothing has changed recently especially with your several surgeries and new medications.  Please make sure that you are drinking plenty of fluids during the day and following a well-balanced diet to make sure your body has nutrition and needs to stay strong.  I encourage you to establish with a therapist to help with your mood.  Please follow-up with Dr. Ardelia Mems in 2 weeks or sooner if needed if any difficulty breathing, chest pain, or new onset weakness/facial droop.   Therapy and Counseling Resources Most providers on this list will take Medicaid. Patients with commercial insurance or Medicare should contact their insurance company to get a list of in network providers.  [employer] [facility]  [address] [place], [place] [address]      [place] [address]  [place], [place] [address] [phone]  [place] [address], [place]  [place], [place] [address]       [phone]      [facility] [address] [place], [place]  [facility]:  [place]. [place] [phone]  [facility]:  [address] [place] [phone]  [facility] (under & uninsured) [address], [place] [phone]    [email or web address]    [place] [address] . [place]    [phone]  [facility] [place]     Phone:  [phone]     [place]  [place] [address]      [place], [place] ext [place]: [place], [place], [place]   [place] ([place] therapist) [address] [place] [address]   [place] [address]     [phone]    [facility]   [place],  [place], [place]   [place]  [place]  [facility]  [address] [place], [place]        [phone]  Open Access/Walk In Clinic under & uninsured  [facility]  [address] [place], [place] Crisis [phone]  [facility] [place],  ([employer])   [place], [place]: [phone]) 8:30 - 12; 1 - 2:30  [facility] of the [place],  [place], [place]    [phone]):8:30 - 12; 2 - 3PM  [facility] [employer],  [address],  [place]; [phone]):   Mon - Fri 8 AM - 5 PM  [facility] [place]  MWF 12:30 to 3:00 or call to schedule an appointment  [phone]  Specific Provider options Psychology Today  [email or web address].[email or web address]/us 1. click on find a therapist  2. enter your zip code 3. left side and select or tailor a therapist for your specific need.   [facility] Provider Directory [email or web address]  (Medicaid)   Follow all drop down to find a provider  [place] [phone] or [email or web address] [address], [place], [place] Recovery support and educational   24- Hour  Availability:  .  [facility]  . [place], [place] [facility] [phone]  . Family Service of the [employer] [phone]  [facility] Crisis Service  [phone]   . [place]  [phone] (after hours)  . Therapeutic Alternative/Mobile Crisis   [phone]  . [place] National Suicide Hotline  [phone] ([place])  . Call 911 or go to emergency room  . [employer]  [phone]);  [place] and [employer]   . [facility]  ACCESS  [phone]); [place], [place], [place], [place], [place], [place], [place]

## 2020-04-24 LAB — BASIC METABOLIC PANEL
BUN/Creatinine Ratio: 19 (ref 12–28)
BUN: 20 mg/dL (ref 8–27)
CO2: 19 mmol/L — ABNORMAL LOW (ref 20–29)
Calcium: 10 mg/dL (ref 8.7–10.3)
Chloride: 98 mmol/L (ref 96–106)
Creatinine, Ser: 1.08 mg/dL — ABNORMAL HIGH (ref 0.57–1.00)
GFR calc Af Amer: 64 mL/min/{1.73_m2} (ref >59)
GFR calc non Af Amer: 56 mL/min/{1.73_m2} — ABNORMAL LOW (ref >59)
Glucose: 95 mg/dL (ref 65–99)
Potassium: 3.8 mmol/L (ref 3.5–5.2)
Sodium: 137 mmol/L (ref 134–144)

## 2020-04-24 LAB — CBC WITH DIFFERENTIAL/PLATELET
Basophils Absolute: 0 10*3/uL (ref 0.0–0.2)
Basos: 1 %
EOS (ABSOLUTE): 0.2 10*3/uL (ref 0.0–0.4)
Eos: 4 %
Hematocrit: 35.6 % (ref 34.0–46.6)
Hemoglobin: 11.4 g/dL (ref 11.1–15.9)
Immature Grans (Abs): 0 10*3/uL (ref 0.0–0.1)
Immature Granulocytes: 0 %
Lymphocytes Absolute: 1.9 10*3/uL (ref 0.7–3.1)
Lymphs: 37 %
MCH: 23.9 pg — ABNORMAL LOW (ref 26.6–33.0)
MCHC: 32 g/dL (ref 31.5–35.7)
MCV: 75 fL — ABNORMAL LOW (ref 79–97)
Monocytes Absolute: 0.4 10*3/uL (ref 0.1–0.9)
Monocytes: 9 %
Neutrophils Absolute: 2.5 10*3/uL (ref 1.4–7.0)
Neutrophils: 49 %
Platelets: 378 10*3/uL (ref 150–450)
RBC: 4.77 x10E6/uL (ref 3.77–5.28)
RDW: 15.5 % — ABNORMAL HIGH (ref 11.7–15.4)
WBC: 5.1 10*3/uL (ref 3.4–10.8)

## 2020-04-24 LAB — HEMOGLOBIN A1C
Est. average glucose Bld gHb Est-mCnc: 126 mg/dL
Hgb A1c MFr Bld: 6 % — ABNORMAL HIGH (ref 4.8–5.6)

## 2020-04-24 LAB — TSH: TSH: 2.13 u[IU]/mL (ref 0.450–4.500)

## 2020-04-29 ENCOUNTER — Other Ambulatory Visit: Payer: Self-pay | Admitting: Family Medicine

## 2020-04-29 DIAGNOSIS — F39 Unspecified mood [affective] disorder: Secondary | ICD-10-CM

## 2020-04-30 NOTE — Progress Notes (Signed)
PATIENT: Sherry Anderson DOB: 27-Jul-1961  REASON FOR VISIT: follow up HISTORY FROM: patient  HISTORY OF PRESENT ILLNESS: Today 05/01/20  HISTORY Sherry Gutierrezis a 59 year old female, seen in request byher primary care physician Dr.Neal, Sarafor evaluation of right jaw pain, initial evaluation was September 13, 2019.  I have reviewed and summarized the referring note from the referring physician.She has past medical history of hypertension, hypothyroidism, on supplement,  She had one episode of similar right jaw pain about 3 years ago, lasting 3 to 4 months gradually improved, around November 2020, she had recurrent right side jaw pain, radiating pain from right ear to right jaw, sharp transient electric shooting pain,'worse than the childbirth pain,"sometimes she has difficulty eating, opening her mouth, she has tried over-the-counter naproxen without helping  She denies hearing loss, denies facial weakness, denies visual loss  I personally reviewed MRI of the brain without contrast in May 2019 that was normal.  Laboratory evaluation January 2021:CMP was normal, creatinine of 0.87, normal CBC, hemoglobin of 13.4, TSH, total cholesterol of 266, LDL of 168,  Update March 2, 2021SS:She was tried on gabapentin, was not beneficial, switched to Lyrica.MRI of the brain with and without contrast through right trigeminal ganglionwas normal. She has complained of continued pain, Trileptal has been added 150 mg twice a day, taking Lyrica 150 mg twice a day.  Reports continued right-sided trigeminal pain,sharp,electric-like,keeping her up at night, sensitive to temperature, touch, eating. Medications seems to be helping slightly.  Update Dec 19, 2019 SS: She went to the ER 10/19/19 for trigeminal neuralgia pain, was given hydrocodone, 10 tablets.  She is currently taking oxcarbazepine 300 mg in the morning, 300 mg midday, 600 mg at bedtime, Lyrica 150 mg 3 times a  day. Gabapentin was not beneficial. She has been referred to [facility] Dr. Salomon Fick, resent 12/12/2019. Her pain is still there on the right side, is somewhat better, only with medication does she notice improvement. With medications 3/10 pain level, without the medications, she is crying. She is sleeping better, occasionally will wake her up. Talking, eating, putting lotion on her face is hard for her, hard to brush her teeth. Will take hydrocodone occasionally if needed. Trileptal has been most helpful vs Lyrica.    Update May 01, 2020 SS: Did see Dr. Salomon Fick at [facility], felt to be a good candidate for right microvascular decompression.  Had the surgery on 6/11, she had persistent leakage from her incision site with right-sided Bell's palsy and pseudomeningocele, had a VP shunt placed on 6/29, she had PICC line requiring antibiotics. Saw Dr. Salomon Fick 8/9 doing well, follow-up in 2 years.  No further headaches. Developed left DVT, on Eliquis. Doing well, some fatigue now, but no more trigeminal pain, she is so relieved. Right bell's palsy essentially resolved, some numbness to right side around surgical site, decreased sensation to right face, V2.  REVIEW OF SYSTEMS: Out of a complete 14 system review of symptoms, the patient complains only of the following symptoms, and all other reviewed systems are negative.  N/A  ALLERGIES: No Known Allergies  HOME MEDICATIONS: Outpatient Medications Prior to Visit  Medication Sig Dispense Refill   acetaminophen (TYLENOL) 325 MG tablet Take 650 mg by mouth every 6 (six) hours as needed. OTC PRN     apixaban (ELIQUIS) 5 MG TABS tablet Take 5 mg by mouth 2 (two) times daily.     Calcium Citrate-Vitamin D (CALCIUM + D PO) Take 1 tablet by mouth 2 (two) times daily.  levothyroxine (SYNTHROID) 75 MCG tablet Take 1 tablet (75 mcg total) by mouth every morning. 30 minutes before food 90 tablet 0   lisinopril (ZESTRIL) 20 MG tablet TAKE 1 TABLET BY MOUTH EVERY  DAY 90 tablet 0   omeprazole (PRILOSEC) 20 MG capsule TAKE 1 CAPSULE BY MOUTH TWICE A DAY 180 capsule 0   triamterene-hydrochlorothiazide (DYAZIDE) 37.5-25 MG capsule TAKE 1 EACH (1 CAPSULE TOTAL) BY MOUTH DAILY. 90 capsule 0   venlafaxine XR (EFFEXOR-XR) 75 MG 24 hr capsule TAKE 3 CAPSULES BY MOUTH EVERY DAY 270 capsule 1   No facility-administered medications prior to visit.    PAST MEDICAL HISTORY: 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   Associated with high-grade dysplasia   Anxiety and depression    Breast cancer ([place]) 2002   left breast cancer    Depression    Diverticulosis of colon    Essential hypertension, benign    GERD (gastroesophageal reflux disease)    Hepatitis    unsure of which type   High grade squamous intraepithelial cervical dysplasia 05/2017   Associated with adenocarcinoma in situ   History of adenomatous polyp of colon    tubular adenoma's  02-08-2017   History of gastritis    History of left breast cancer 2002---- per pt no recurrence   dx DCIS --- s/p  left breast lumpectomy;  per pt radiation therapy completed same year and completed antiestrogen therapy    History of radiation therapy    2002   left breast   Hypothyroidism    Pain    right ear and jaw   Spinal stenosis    Spondylolisthesis    SVD (spontaneous vaginal delivery)    x 1    PAST SURGICAL HISTORY: Past Surgical History:  Procedure Laterality Date   BREAST LUMPECTOMY Left 2002   ductal CA in situ   CERVICAL CONIZATION W/BX N/A 06/08/2017   Procedure: CONIZATION CERVIX WITH BIOPSY;  Surgeon: Anastasio Auerbach, MD;  Location: [place];  Service: Gynecology;  Laterality: N/A;   CESAREAN SECTION  1990   twins   COLONOSCOPY  02-08-2017   dr Karleen [place]   DILATATION & CURETTAGE/HYSTEROSCOPY WITH MYOSURE N/A 06/08/2017   Procedure: DILATATION & CURETTAGE/HYSTEROSCOPY WITH MYOSURE;   Surgeon: Anastasio Auerbach, MD;  Location: [place];  Service: Gynecology;  Laterality: N/A;   ESOPHAGOGASTRODUODENOSCOPY  last one 11-24-2016   dr Karleen Hampshire   ROBOTIC ASSISTED TOTAL HYSTERECTOMY WITH BILATERAL SALPINGO OOPHERECTOMY N/A 09/21/2017   Procedure: XI ROBOTIC ASSISTED TOTAL HYSTERECTOMY WITH BILATERAL SALPINGO OOPHORECTOMY, Lysis of Adhesions;  Surgeon: Princess Bruins, MD;  Location: [facility];  Service: Gynecology;  Laterality: N/A;   TOOTH EXTRACTION     TRANSFORAMINAL LUMBAR INTERBODY FUSION (TLIF) WITH PEDICLE SCREW FIXATION 2 LEVEL N/A 04/13/2018   Procedure: RIGHT-SIDED LUMBAR 4-5 AND LUMBAR 5-SACRUM 1 TRANSFORAMINAL LUMBAR INTERBODY FUSION WITH INSTRUMENTATION AND ALLOGRAFT;  Surgeon: Phylliss Bob, MD;  Location: [place];  Service: Orthopedics;  Laterality: N/A;   WISDOM TOOTH EXTRACTION      FAMILY HISTORY: Family History  Problem Relation Age of Onset   Diabetes Mother    Hypertension Mother    Heart disease Father        Pacemaker   Thyroid disease Sister    Schizophrenia Brother    Colon cancer Neg Hx    Esophageal cancer Neg Hx    Rectal cancer Neg Hx    Stomach  cancer Neg Hx    Breast cancer Neg Hx     SOCIAL HISTORY: Social History   Socioeconomic History   Marital status: Legally Separated    Spouse name: Not on file   Number of children: 3   Years of education: 6   Highest education level: Not on file  Occupational History   Occupation: Disability  Tobacco Use   Smoking status: Never Smoker   Smokeless tobacco: Never Used  Scientific laboratory technician Use: Never used  Substance and Sexual Activity   Alcohol use: No   Drug use: No   Sexual activity: Not Currently    Birth control/protection: Post-menopausal    Comment: 1st intercourse 59 yo-Fewer than 5 partners  Other Topics Concern   Not on file  Social History Narrative   Moved from [place] to [place], to [place] at home with her nephew  and her son.   Right-handed.   Occasional use of caffeine.   Social Determinants of Health   Financial Resource Strain:    Difficulty of Paying Living Expenses: Not on file  Food Insecurity:    Worried About Charity fundraiser in the Last Year: Not on file   [employer] of Food in the Last Year: Not on file  Transportation Needs:    Lack of Transportation (Medical): Not on file   Lack of Transportation (Non-Medical): Not on file  Physical Activity:    Days of Exercise per Week: Not on file   Minutes of Exercise per Session: Not on file  Stress:    Feeling of Stress : Not on file  Social Connections:    Frequency of Communication with Friends and Family: Not on file   Frequency of Social Gatherings with Friends and Family: Not on file   Attends Religious Services: Not on file   Active Member of Clubs or Organizations: Not on file   Attends Archivist Meetings: Not on file   Marital Status: Not on file  Intimate Partner Violence:    Fear of Current or Ex-Partner: Not on file   Emotionally Abused: Not on file   Physically Abused: Not on file   Sexually Abused: Not on file   PHYSICAL EXAM  Vitals:   05/01/20 1240  BP: 127/83  Pulse: 92  Weight: 164 lb (74.4 kg)  Height: 5\' 3"  (1.6 m)   Body mass index is 29.05 kg/m.  Generalized: Well developed, in no acute distress   Neurological examination  Mentation: Alert oriented to time, place, history taking. Follows all commands speech and language fluent Cranial nerve II-XII: Pupils were equal round reactive to light. Extraocular movements were full, visual field were full on confrontational test. Facial sensation decreased to soft touch on the right at V2 area.  Head turning and shoulder shrug  were normal and symmetric. Motor: The motor testing reveals 5 over 5 strength of all 4 extremities. Good symmetric motor tone is noted throughout.  Sensory: Sensory testing is intact to soft touch on all 4  extremities. No evidence of extinction is noted.  Coordination: Cerebellar testing reveals good finger-nose-finger and heel-to-shin bilaterally.  Gait and station: Gait is normal.  Reflexes: Deep tendon reflexes are symmetric and normal bilaterally.   DIAGNOSTIC DATA (LABS, IMAGING, TESTING) - I reviewed patient records, labs, notes, testing and imaging myself where available.  Lab Results  Component Value Date   WBC 5.1 04/23/2020   HGB 11.4 04/23/2020   HCT 35.6 04/23/2020  MCV 75 (L) 04/23/2020   PLT 378 04/23/2020      Component Value Date/Time   NA 137 04/23/2020 1013   K 3.8 04/23/2020 1013   CL 98 04/23/2020 1013   CO2 19 (L) 04/23/2020 1013   GLUCOSE 95 04/23/2020 1013   GLUCOSE 145 (H) 02/05/2020 1108   BUN 20 04/23/2020 1013   CREATININE 1.08 (H) 04/23/2020 1013   CREATININE 0.96 11/02/2017 1026   CALCIUM 10.0 04/23/2020 1013   PROT 7.5 02/05/2020 1108   PROT 7.2 12/19/2019 1314   ALBUMIN 3.7 02/05/2020 1108   ALBUMIN 4.5 12/19/2019 1314   AST 18 02/05/2020 1108   ALT 18 02/05/2020 1108   ALKPHOS 60 02/05/2020 1108   BILITOT 0.3 02/05/2020 1108   BILITOT <0.2 12/19/2019 1314   GFRNONAA 56 (L) 04/23/2020 1013   GFRNONAA 76 07/27/2016 1001   GFRAA 64 04/23/2020 1013   GFRAA 87 07/27/2016 1001   Lab Results  Component Value Date   CHOL 266 (H) 08/23/2019   HDL 65 08/23/2019   LDLCALC 168 (H) 08/23/2019   LDLDIRECT 157 (H) 08/20/2008   TRIG 181 (H) 08/23/2019   CHOLHDL 4.1 08/23/2019   Lab Results  Component Value Date   HGBA1C 5.6 04/23/2020   No results found for: VITAMINB12 Lab Results  Component Value Date   TSH 2.130 04/23/2020      ASSESSMENT AND PLAN 59 y.o. year old female  has a past medical history of Adenocarcinoma in situ (AIS) of uterine cervix (05/2017), Anxiety and depression, Breast cancer ([place]) (2002), Depression, Diverticulosis of colon, Essential hypertension, benign, GERD (gastroesophageal reflux disease), Hepatitis, High  grade squamous intraepithelial cervical dysplasia (05/2017), History of adenomatous polyp of colon, History of gastritis, History of left breast cancer (2002---- per pt no recurrence), History of radiation therapy, Hypothyroidism, Pain, Spinal stenosis, Spondylolisthesis, and SVD (spontaneous vaginal delivery). here with:  1. Right trigeminal neuralgia 2. Status post right microvascular decompression with Dr. Salomon Fick 01/25/20, experience leakage from the area developed pseudomeningocele, right-sided Bell's palsy, had VP shunt placed, required PICC line with IV antibiotics, Left DVT, now on Eliquis -No more headaches, has come off medications -Saw Dr. Salomon Fick 03/24/20, f/u in 2 years, CT head  -Continue follow-up with PCP -Follow-up here on as-needed basis  I spent 30 minutes of face-to-face and non-face-to-face time with patient.  This included previsit chart review, lab review, study review, order entry, electronic health record documentation, patient education.  Butler Denmark, AGNP-C, DNP 05/01/2020, 1:00 PM [facility] [address] [place], [place] [address] [phone]

## 2020-05-01 ENCOUNTER — Ambulatory Visit: Payer: Medicaid Other | Admitting: Neurology

## 2020-05-01 ENCOUNTER — Other Ambulatory Visit: Payer: Self-pay | Admitting: Family Medicine

## 2020-05-01 ENCOUNTER — Encounter: Payer: Self-pay | Admitting: Neurology

## 2020-05-01 VITALS — BP 127/83 | HR 92 | Ht 63.0 in | Wt 164.0 lb

## 2020-05-01 DIAGNOSIS — G500 Trigeminal neuralgia: Secondary | ICD-10-CM | POA: Diagnosis not present

## 2020-05-01 NOTE — Patient Instructions (Signed)
Continue follow-up with Dr. Salomon Fick as ordered and your primary doctor. I am glad your pain is better! Return to our clinic on as needed basis.

## 2020-05-12 ENCOUNTER — Encounter: Payer: Self-pay | Admitting: Family Medicine

## 2020-05-12 ENCOUNTER — Ambulatory Visit: Payer: Medicaid Other | Admitting: Family Medicine

## 2020-05-12 ENCOUNTER — Other Ambulatory Visit: Payer: Self-pay

## 2020-05-12 VITALS — BP 110/75 | HR 128 | Ht 63.0 in | Wt 162.4 lb

## 2020-05-12 DIAGNOSIS — K219 Gastro-esophageal reflux disease without esophagitis: Secondary | ICD-10-CM

## 2020-05-12 DIAGNOSIS — R000 Tachycardia, unspecified: Secondary | ICD-10-CM

## 2020-05-12 DIAGNOSIS — E039 Hypothyroidism, unspecified: Secondary | ICD-10-CM | POA: Diagnosis not present

## 2020-05-12 DIAGNOSIS — I471 Supraventricular tachycardia: Secondary | ICD-10-CM

## 2020-05-12 DIAGNOSIS — I82412 Acute embolism and thrombosis of left femoral vein: Secondary | ICD-10-CM

## 2020-05-12 DIAGNOSIS — F39 Unspecified mood [affective] disorder: Secondary | ICD-10-CM | POA: Diagnosis not present

## 2020-05-12 DIAGNOSIS — I10 Essential (primary) hypertension: Secondary | ICD-10-CM | POA: Diagnosis not present

## 2020-05-12 DIAGNOSIS — E038 Other specified hypothyroidism: Secondary | ICD-10-CM

## 2020-05-12 DIAGNOSIS — Z23 Encounter for immunization: Secondary | ICD-10-CM | POA: Diagnosis not present

## 2020-05-12 MED ORDER — SHINGRIX 50 MCG/0.5ML IM SUSR: 0.5000 mL | Freq: Once | INTRAMUSCULAR | 1 refills | Status: AC

## 2020-05-12 MED ORDER — OMEPRAZOLE 20 MG PO CPDR: 20.0000 mg | DELAYED_RELEASE_CAPSULE | Freq: Two times a day (BID) | ORAL | 2 refills | Status: DC

## 2020-05-12 NOTE — Assessment & Plan Note (Signed)
Well controlled. Continue current medication regimen.  

## 2020-05-12 NOTE — Progress Notes (Signed)
  Date of Visit: 05/12/2020   SUBJECTIVE:   HPI:  Sherry Anderson presents today for routine follow up.  DVT - tolerating eliquis 5mg  twice daily. Does admit to feeling heart race recently. Also has some fatigue that has not let up since her surgery, possibly some dyspnea on exertion as well. Symptoms are somewhat vague but persisting.  Depression - taking venlafaxine XR 225mg  daily. No SI/HI. Still feels quite depressed and sluggish. Has not scheduled with a counselor.  GERD - taking omeprazole 20mg  twice daily. Tolerating well, symptoms well controlled on this medication.  Hypertension - taking lisinopril 20mg  daily and triamterene-HCTZ 37.5-25mg  daily.  OBJECTIVE:   BP 110/75   Pulse (!) 128   Ht 5\' 3"  (1.6 m)   Wt 162 lb 6.4 oz (73.7 kg)   LMP  (LMP Unknown)   SpO2 97%   BMI 28.77 kg/m   Heart rate decreased to 109 while resting Gen: no acute distress, pleasant, cooperative HEENT: normocephalic, atraumatic  Heart: tachycardic, regular rhythm, no murmurs Lungs: clear to auscultation bilaterally, normal work of breathing  Neuro: alert, speech normal Ext: no edema bilateral lower extremities Psych: normal range of affect, well groomed, speech normal in rate and volume, normal eye contact   ASSESSMENT/PLAN:   Health maintenance:  -flu shot given today -based on current CDC recommendations, eligible for COVID booster on Oct 10 due to overweight status, advised patient of this -past due for 3 year follow up on colonoscopy - advised to contact GI office to schedule -given rx for shingles vaccine to take to her pharmacy -she reports her GYN told her she DOES still need paps and that she should have them done at that office. Advised her to schedule follow up with GYN since she is 60yrs out from her surgery  Dvt femoral (deep venous thrombosis) (HCC) Pain and swelling improving. No bleeding, tolerating eliquis well. Discussed general recommendation of 87mos anticoagulation for first  episode of provoked DVT, this would put her on anticoagulation through mid-November of this year.  However, given she also has tachycardia and believes is having new shortness of breath in the last month or so, will check CTA chest to rule out PE. If positive, it would change management - would change to another anticoagulant and extend duration at least 3 months beyond PE diagnosis. Check BMET today to monitor creatinine prior to CT contrast.  GERD (gastroesophageal reflux disease) Well controlled. Continue current medication regimen.   HYPERTENSION, BENIGN ESSENTIAL Well controlled. Continue current medication regimen.   Subclinical hypothyroidism At goal on recent TSH. Continue levothyroxine 45mcg daily.  Mood disorder (HCC) Persisting symptoms of depression. Recommend she at minimum see a counselor, also consider psychiatrist. She is agreeable. Gave phone # for [facility] to schedule with them. No suicidal ideation/HI.  FOLLOW UP: Follow up in 3 months with me for above issues  [place] J. Ardelia Mems, [place]

## 2020-05-12 NOTE — Assessment & Plan Note (Signed)
At goal on recent TSH. Continue levothyroxine 49mcg daily.

## 2020-05-12 NOTE — Assessment & Plan Note (Signed)
Persisting symptoms of depression. Recommend she at minimum see a counselor, also consider psychiatrist. She is agreeable. Gave phone # for [facility] to schedule with them. No suicidal ideation/HI.

## 2020-05-12 NOTE — Patient Instructions (Addendum)
Please call your OB/GYN office to schedule a follow up pap smear  Please call the GI office about scheduling your follow up colonoscopy  Flu shot today  Take prescription for shingrix to your pharmacy  Based on current CDC recommendations, you would be eligible for COVID booster shot on or after May 25, 2020.  Call the Behavioral Medicine center to schedule an appointment. Their phone number is: [phone]. I recommend you see a psychiatrist as well as a Social worker.  Follow up with me in 3 months, sooner if needed  Be well, Dr. Ardelia Mems

## 2020-05-12 NOTE — Assessment & Plan Note (Addendum)
Pain and swelling improving. No bleeding, tolerating eliquis well. Discussed general recommendation of 66mos anticoagulation for first episode of provoked DVT, this would put her on anticoagulation through mid-November of this year.  However, given she also has tachycardia and believes is having new shortness of breath in the last month or so, will check CTA chest to rule out PE. If positive, it would change management - would change to another anticoagulant and extend duration at least 3 months beyond PE diagnosis. Check BMET today to monitor creatinine prior to CT contrast.

## 2020-05-13 LAB — BASIC METABOLIC PANEL
BUN/Creatinine Ratio: 14 (ref 12–28)
BUN: 18 mg/dL (ref 8–27)
CO2: 21 mmol/L (ref 20–29)
Calcium: 10.4 mg/dL — ABNORMAL HIGH (ref 8.7–10.3)
Chloride: 99 mmol/L (ref 96–106)
Creatinine, Ser: 1.31 mg/dL — ABNORMAL HIGH (ref 0.57–1.00)
GFR calc Af Amer: 51 mL/min/{1.73_m2} — ABNORMAL LOW (ref >59)
GFR calc non Af Amer: 44 mL/min/{1.73_m2} — ABNORMAL LOW (ref >59)
Glucose: 114 mg/dL — ABNORMAL HIGH (ref 65–99)
Potassium: 3.8 mmol/L (ref 3.5–5.2)
Sodium: 140 mmol/L (ref 134–144)

## 2020-05-19 ENCOUNTER — Telehealth: Payer: Self-pay

## 2020-05-19 NOTE — Telephone Encounter (Signed)
Called patient and informed her of appointment.  CT Angio Chest   06/03/2020 Zacarias Pontes [id number] with arrival @1515  NPO 4 hours prior  Patient verbalized understanding.  Ozella Almond, [place]

## 2020-05-23 ENCOUNTER — Telehealth: Payer: Self-pay | Admitting: Family Medicine

## 2020-05-23 DIAGNOSIS — R7989 Other specified abnormal findings of blood chemistry: Secondary | ICD-10-CM

## 2020-05-23 NOTE — Telephone Encounter (Signed)
Called patient to discuss BMET Creat mildly up, as is Calcium. May just be she was a bit dehydrated that day.  Recheck BMET, scheduled lab visit for this Monday If persistent hypercalcemia or elev creatinine may need to back off medications (is on HCTZ, triamterene, and lisinopril)  Leeanne Rio, MD

## 2020-05-23 NOTE — Telephone Encounter (Signed)
Is there any way to get this scheduled sooner? That's nearly 20 days out from her visit  Thanks Leeanne Rio, MD

## 2020-05-26 ENCOUNTER — Other Ambulatory Visit: Payer: Self-pay

## 2020-05-26 ENCOUNTER — Other Ambulatory Visit: Payer: Medicaid Other

## 2020-05-26 DIAGNOSIS — R7989 Other specified abnormal findings of blood chemistry: Secondary | ICD-10-CM

## 2020-05-27 LAB — BASIC METABOLIC PANEL
BUN/Creatinine Ratio: 12 (ref 12–28)
BUN: 11 mg/dL (ref 8–27)
CO2: 21 mmol/L (ref 20–29)
Calcium: 9.4 mg/dL (ref 8.7–10.3)
Chloride: 101 mmol/L (ref 96–106)
Creatinine, Ser: 0.94 mg/dL (ref 0.57–1.00)
GFR calc Af Amer: 76 mL/min/{1.73_m2} (ref >59)
GFR calc non Af Amer: 66 mL/min/{1.73_m2} (ref >59)
Glucose: 105 mg/dL — ABNORMAL HIGH (ref 65–99)
Potassium: 4.1 mmol/L (ref 3.5–5.2)
Sodium: 137 mmol/L (ref 134–144)

## 2020-05-29 NOTE — Telephone Encounter (Signed)
Have checked with Centralized scheduling and there is no availability at [facility] until after 06/09/2020.  There are also no availabilties at [facility] or [facility].  Ozella Almond, [place]

## 2020-06-03 ENCOUNTER — Ambulatory Visit (HOSPITAL_COMMUNITY)
Admission: RE | Admit: 2020-06-03 | Discharge: 2020-06-03 | Disposition: A | Discharge status: Home / Self Care | Payer: Medicaid Other | Source: Ambulatory Visit | Attending: Family Medicine | Admitting: Family Medicine

## 2020-06-03 DIAGNOSIS — R000 Tachycardia, unspecified: Secondary | ICD-10-CM | POA: Diagnosis not present

## 2020-06-03 DIAGNOSIS — I2699 Other pulmonary embolism without acute cor pulmonale: Secondary | ICD-10-CM | POA: Diagnosis not present

## 2020-06-03 DIAGNOSIS — K449 Diaphragmatic hernia without obstruction or gangrene: Secondary | ICD-10-CM | POA: Diagnosis not present

## 2020-06-03 DIAGNOSIS — R0600 Dyspnea, unspecified: Secondary | ICD-10-CM | POA: Diagnosis not present

## 2020-06-03 MED ORDER — IOHEXOL 350 MG/ML SOLN
60.0000 mL | Freq: Once | INTRAVENOUS | Status: AC | PRN
  Administered 2020-06-03: 60 mL via INTRAVENOUS

## 2020-06-04 ENCOUNTER — Telehealth: Payer: Self-pay | Admitting: Family Medicine

## 2020-06-04 DIAGNOSIS — Z86711 Personal history of pulmonary embolism: Secondary | ICD-10-CM | POA: Insufficient documentation

## 2020-06-04 DIAGNOSIS — I2699 Other pulmonary embolism without acute cor pulmonale: Secondary | ICD-10-CM | POA: Insufficient documentation

## 2020-06-04 MED ORDER — ENOXAPARIN SODIUM 80 MG/0.8ML ~~LOC~~ SOLN: 70.0000 mg | Freq: Two times a day (BID) | SUBCUTANEOUS | 3 refills | Status: DC

## 2020-06-04 NOTE — Telephone Encounter (Signed)
Spoke with Dr. Salomon Fick. He is fine with patient taking lovenox.  Spoke with Karlene Einstein PharmD of inpatient pharmacy at [facility], who advises her appropriate dose is 70mg  twice daily (round down from 72.5mg ). Patient will need 80mg  pre filled syringe and will discard the necessary amount - per [facility], outpatient pharmacist will know to instruct patient on this.  Patient aware I am sending in the rx tonight and states she is able to pick it up this evening. She knows pharmacist will need to give instructions. If she has questions, Dr. Valentina Lucks is able to meet with her here at the [facility] tomorrow to review the medication (he is aware of her situation).  Patient appreciative.  Leeanne Rio, MD

## 2020-06-04 NOTE — Telephone Encounter (Signed)
Contacted patient regarding CTA results, which show tiny R sided PEs. Patient reports compliance with her eliquis ever since she was started on it, denies any missed doses.  I think this does represent treatment failure. After discussion with several colleagues, recommend changing her anticoagulation from eliquis to lovenox. Patient is agreeable to this change and is comfortable with giving herself injections.   Given her recent neurosurgical procedures, including trigeminal nerve decompression with pseudomeningocele with VP shunt placement, I want to clear this change with her neurosurgeon first. I have attempted to call and speak with him (Dr. Salomon Fick at [facility]) but am awaiting a callback. Once I have his blessing, I will send in lovenox for patient. Patient is aware I am waiting to confirm this prior to prescribing.  Leeanne Rio, MD

## 2020-06-05 ENCOUNTER — Telehealth: Payer: Self-pay | Admitting: Pharmacist

## 2020-06-05 NOTE — Telephone Encounter (Signed)
error 

## 2020-06-05 NOTE — Telephone Encounter (Signed)
Contacted patient for Lovenox counseling. Retail pharmacy provided patient with Lovenox 80mg /0.29mL syringe and patient administered first dose of 80 mg instead of 70 mg as prescribed this morning (patient unaware of prescribed dose). Counseled patient to discard 0.1 ml (push syringe to "0.7" line) to receive the correct prescribed dose. Patient verbalized understanding. Discussed the importance of medication adherence and to rotate injection sites. Also, discussed the risk of bleeding and to avoid NSAIDs. Recommended Tylenol for pain management.  Lorel Monaco, PharmD PGY2 Ambulatory Care Resident [place]

## 2020-06-12 DIAGNOSIS — G519 Disorder of facial nerve, unspecified: Secondary | ICD-10-CM | POA: Diagnosis not present

## 2020-06-12 DIAGNOSIS — H04123 Dry eye syndrome of bilateral lacrimal glands: Secondary | ICD-10-CM | POA: Diagnosis not present

## 2020-06-12 DIAGNOSIS — H0223A Paralytic lagophthalmos right eye, upper and lower eyelids: Secondary | ICD-10-CM | POA: Diagnosis not present

## 2020-06-12 DIAGNOSIS — G510 Bell's palsy: Secondary | ICD-10-CM | POA: Diagnosis not present

## 2020-06-12 DIAGNOSIS — G509 Disorder of trigeminal nerve, unspecified: Secondary | ICD-10-CM | POA: Diagnosis not present

## 2020-06-17 ENCOUNTER — Other Ambulatory Visit: Payer: Self-pay | Admitting: *Deleted

## 2020-06-17 NOTE — Patient Outreach (Signed)
Care Coordination  06/17/2020  Sherry Anderson [birth year] [phone]   An unsuccessful telephone outreach was attempted today. The patient was referred to the case management team for assistance with care management and care coordination.   Follow Up Plan: A HIPAA compliant phone message was left for the patient providing contact information and requesting a return call.  The Managed Medicaid care management team will reach out to the patient again over the next 7-14 days.   Lurena Joiner RN, BSN [place]  [employer] manager

## 2020-06-17 NOTE — Patient Instructions (Signed)
Visit Information  [name]  - as a part of your Medicaid benefit, you are eligible for care management and care coordination services at no cost or copay. I was unable to reach you by phone today but would be happy to help you with your health related needs. Please feel free to call me @ [phone].   A member of the Managed Medicaid care management team will reach out to you again over the next 7-14 days.   Lurena Joiner RN, BSN [place]  [employer] manager

## 2020-06-24 ENCOUNTER — Other Ambulatory Visit: Payer: Self-pay | Admitting: Neurology

## 2020-06-26 ENCOUNTER — Ambulatory Visit: Payer: Medicaid Other | Admitting: Nurse Practitioner

## 2020-06-28 ENCOUNTER — Other Ambulatory Visit: Payer: Self-pay | Admitting: Family Medicine

## 2020-06-30 ENCOUNTER — Other Ambulatory Visit: Payer: Self-pay

## 2020-06-30 ENCOUNTER — Encounter: Payer: Self-pay | Admitting: Obstetrics & Gynecology

## 2020-06-30 ENCOUNTER — Ambulatory Visit: Payer: Medicaid Other | Admitting: Obstetrics & Gynecology

## 2020-06-30 VITALS — BP 126/78

## 2020-06-30 DIAGNOSIS — Z90722 Acquired absence of ovaries, bilateral: Secondary | ICD-10-CM | POA: Diagnosis not present

## 2020-06-30 DIAGNOSIS — Z90710 Acquired absence of both cervix and uterus: Secondary | ICD-10-CM | POA: Diagnosis not present

## 2020-06-30 DIAGNOSIS — Z1272 Encounter for screening for malignant neoplasm of vagina: Secondary | ICD-10-CM | POA: Diagnosis not present

## 2020-06-30 DIAGNOSIS — R1030 Lower abdominal pain, unspecified: Secondary | ICD-10-CM

## 2020-06-30 DIAGNOSIS — Z9079 Acquired absence of other genital organ(s): Secondary | ICD-10-CM | POA: Diagnosis not present

## 2020-06-30 NOTE — Telephone Encounter (Signed)
Patient states that she is not on this medicine and did not make the request.  .Sherry Anderson, [place]

## 2020-06-30 NOTE — Progress Notes (Addendum)
    Sherry Anderson 1960-12-09 [id number]        59 y.o.  G2P2L3  Lost both her mother and father last year.  RP: Lower abdominal pain intermittently x 3 months  HPI: S/P Robotic TLH/BSO for Cervical AdenoCarcinoma in situ in 09/2017.   Lower abdominal/suprapubic pain intermittently x 3 months.  No urinary frequency or pain with miction.  Bowel movements normal per patient.  No fever.  Not very physically active.    OB History  Gravida Para Term Preterm AB Living  2         3  SAB TAB Ectopic Multiple Live Births        1      # Outcome Date GA Lbr Len/2nd Weight Sex Delivery Anes PTL Lv  2 Gravida           1 Gravida             Past medical history,surgical history, problem list, medications, allergies, family history and social history were all reviewed and documented in the EPIC chart.   Directed ROS with pertinent positives and negatives documented in the history of present illness/assessment and plan.  Exam:  Vitals:   06/30/20 1450  BP: 126/78   General appearance:  Normal  Abdomen: Soft, not distended, nontender.  No mass felt.  CVAT Negative bilaterally  Gynecologic exam: Vulva normal.  Speculum: Vaginal vault and vagina normal.  Normal secretions with no blood.  Pap test done at the vaginal vault.  U/A: Yellow clear, nitrites neg, protein neg, WBC 6-10, RBC Neg, Bacteria moderate.   Assessment/Plan:  59 y.o. G2P2L3  1. Lower abdominal pain Intermittent mild lower abdominal pain/discomfort for 3 months.  Urine analysis minimally perturbed.  Will wait on urine culture to decide if treatment is needed.  No pelvic mass felt on gynecologic exam.  Status post Robotic TLH/BSO.  Lower abdominal pain unlikely to be of gynecologic origin.  We will complete the gynecologic investigation with a pelvic ultrasound at follow-up.  If negative consider abdominal pelvic CT scan.  CBC and CMP drawn today. - Urinalysis,Complete w/RFL Culture - CBC - Comp Met (CMET) - US  Transvaginal Non-OB; Future  2. S/P total hysterectomy and BSO (bilateral salpingo-oophorectomy) Robotic TLH/BSO for CIS.  Pap reflex done today.  Princess Bruins MD, 3:05 PM 06/30/2020

## 2020-06-30 NOTE — Telephone Encounter (Signed)
Can you clarify with patient if she is actually taking this? It is not on her current medication list. Thanks Leeanne Rio, MD

## 2020-07-01 ENCOUNTER — Other Ambulatory Visit: Payer: Self-pay | Admitting: *Deleted

## 2020-07-01 DIAGNOSIS — Z1272 Encounter for screening for malignant neoplasm of vagina: Secondary | ICD-10-CM | POA: Diagnosis not present

## 2020-07-01 LAB — HM PAP SMEAR

## 2020-07-01 LAB — CBC
HCT: 33.1 % — ABNORMAL LOW (ref 35.0–45.0)
Hemoglobin: 10.6 g/dL — ABNORMAL LOW (ref 11.7–15.5)
MCH: 24.4 pg — ABNORMAL LOW (ref 27.0–33.0)
MCHC: 32 g/dL (ref 32.0–36.0)
MCV: 76.3 fL — ABNORMAL LOW (ref 80.0–100.0)
MPV: 12.1 fL (ref 7.5–12.5)
Platelets: 340 10*3/uL (ref 140–400)
RBC: 4.34 10*6/uL (ref 3.80–5.10)
RDW: 15.7 % — ABNORMAL HIGH (ref 11.0–15.0)
WBC: 7.3 10*3/uL (ref 3.8–10.8)

## 2020-07-01 LAB — COMPREHENSIVE METABOLIC PANEL
AG Ratio: 1.2 (calc) (ref 1.0–2.5)
ALT: 22 U/L (ref 6–29)
AST: 23 U/L (ref 10–35)
Albumin: 4.3 g/dL (ref 3.6–5.1)
Alkaline phosphatase (APISO): 77 U/L (ref 37–153)
BUN/Creatinine Ratio: 17 (calc) (ref 6–22)
BUN: 18 mg/dL (ref 7–25)
CO2: 24 mmol/L (ref 20–32)
Calcium: 9.8 mg/dL (ref 8.6–10.4)
Chloride: 101 mmol/L (ref 98–110)
Creat: 1.07 mg/dL — ABNORMAL HIGH (ref 0.50–0.99)
Globulin: 3.5 g/dL (calc) (ref 1.9–3.7)
Glucose, Bld: 69 mg/dL (ref 65–99)
Potassium: 4.7 mmol/L (ref 3.5–5.3)
Sodium: 138 mmol/L (ref 135–146)
Total Bilirubin: 0.3 mg/dL (ref 0.2–1.2)
Total Protein: 7.8 g/dL (ref 6.1–8.1)

## 2020-07-01 NOTE — Addendum Note (Signed)
Addended by: Thurnell Garbe A on: 07/01/2020 08:31 AM   Modules accepted: Orders

## 2020-07-01 NOTE — Patient Instructions (Addendum)
Visit Information  Sherry Anderson was given information about Medicaid Managed Care team care coordination services as a part of their Healthy [employer] Medicaid benefit. Sherry Anderson verbally consented to engagement with the [facility] Managed Care team.   For questions related to your Healthy [facility] health plan, please call: [phone] or visit the homepage here: [email or web address]  If you would like to schedule transportation through your Healthy [facility] plan, please call the following number at least 2 days in advance of your appointment: [phone]  Patient Care Plan: Cardiovascular Disease Management (DVT/PE)    Problem Identified: Recent DVT/PE     Long-Range Goal: Cardiovascular Management Plan Developed (DVT/PE)   Start Date: 07/01/2020  Expected End Date: 08/31/2020  This Visit's Progress: On track  Priority: High  Note:      Current Barriers:  . Chronic Disease Management support and education needs related to recent blood clots and anemia  Nurse Case Manager Clinical Goal(s):  [name], patient will attend all scheduled medical appointments . Over the next 30 days, patient will work with CM clinical social worker to discuss feelings of depression . Over the next 60 days, the patient will demonstrate ongoing self health care management ability as evidenced by taking all medications as prescribed, attending scheduled appointments and notifying PCP with any new concerns/symptoms  Interventions:  . Inter-disciplinary care team collaboration (see longitudinal plan of care) . Provided education to patient re: iron rich diet . Reviewed medications with patient and discussed continuing to take medications as prescribed and the importance. Explained that it is normal to have bruising at the injection site with lovenox. Advised to alternate injection sites . Collaborated with LCSW regarding patient having feelings of  depression . Discussed plans with patient for ongoing care management follow up and provided patient with direct contact information for care management team  Patient Goals/Self-Care Activities Over the next 60 days, patient will:  -Self administers medications as prescribed Attends all scheduled provider appointments Calls pharmacy for medication refills Performs ADL's independently Performs IADL's independently Calls provider office for new concerns or questions  Follow Up Plan: Telephone follow up appointment with Managed Medicaid care management team member scheduled for: 07/31/20 @ 1pm        Please see education materials related to iron rich diet provided by MyChart link.    Iron-Rich Diet  Iron is a mineral that helps your body to produce hemoglobin. Hemoglobin is a protein in red blood cells that carries oxygen to your body's tissues. Eating too little iron may cause you to feel weak and tired, and it can increase your risk of infection. Iron is naturally found in many foods, and many foods have iron added to them (iron-fortified foods). You may need to follow an iron-rich diet if you do not have enough iron in your body due to certain medical conditions. The amount of iron that you need each day depends on your age, your sex, and any medical conditions you have. Follow instructions from your health care provider or a diet and nutrition specialist (dietitian) about how much iron you should eat each day. What are tips for following this plan? Reading food labels  Check food labels to see how many milligrams (mg) of iron are in each serving. Cooking  Cook foods in pots and pans that are made from iron.  Take these steps to make it easier for your body to absorb iron from certain foods: ? Soak beans overnight before cooking. ?  Soak whole grains overnight and drain them before using. ? Ferment flours before baking, such as by using yeast in bread dough. Meal planning  When  you eat foods that contain iron, you should eat them with foods that are high in vitamin C. These include oranges, peppers, tomatoes, potatoes, and mango. Vitamin C helps your body to absorb iron. General information  Take iron supplements only as told by your health care provider. An overdose of iron can be life-threatening. If you were prescribed iron supplements, take them with orange juice or a vitamin C supplement.  When you eat iron-fortified foods or take an iron supplement, you should also eat foods that naturally contain iron, such as meat, poultry, and fish. Eating naturally iron-rich foods helps your body to absorb the iron that is added to other foods or contained in a supplement.  Certain foods and drinks prevent your body from absorbing iron properly. Avoid eating these foods in the same meal as iron-rich foods or with iron supplements. These foods include: ? Coffee, black tea, and red wine. ? Milk, dairy products, and foods that are high in calcium. ? Beans and soybeans. ? Whole grains. What foods should I eat? Fruits Prunes. Raisins. Eat fruits high in vitamin C, such as oranges, grapefruits, and strawberries, alongside iron-rich foods. Vegetables Spinach (cooked). Green peas. Broccoli. Fermented vegetables. Eat vegetables high in vitamin C, such as leafy greens, potatoes, bell peppers, and tomatoes, alongside iron-rich foods. Grains Iron-fortified breakfast cereal. Iron-fortified whole-wheat bread. Enriched rice. Sprouted grains. Meats and other proteins Beef liver. Oysters. Beef. Shrimp. [place]. Chicken. [place]. Sardines. Chickpeas. Nuts. Tofu. Pumpkin seeds. Beverages Tomato juice. Fresh orange juice. Prune juice. Hibiscus tea. Fortified instant breakfast shakes. Sweets and desserts Blackstrap molasses. Seasonings and condiments Tahini. Fermented soy sauce. Other foods Wheat germ. The items listed above may not be a complete list of recommended foods and beverages.  Contact a dietitian for more information. What foods should I avoid? Grains Whole grains. Bran cereal. Bran flour. Oats. Meats and other proteins Soybeans. Products made from soy protein. Black beans. Lentils. Mung beans. Split peas. Dairy Milk. Cream. Cheese. Yogurt. Cottage cheese. Beverages Coffee. Black tea. Red wine. Sweets and desserts Cocoa. Chocolate. Ice cream. Other foods Basil. Oregano. Large amounts of parsley. The items listed above may not be a complete list of foods and beverages to avoid. Contact a dietitian for more information. Summary  Iron is a mineral that helps your body to produce hemoglobin. Hemoglobin is a protein in red blood cells that carries oxygen to your body's tissues.  Iron is naturally found in many foods, and many foods have iron added to them (iron-fortified foods).  When you eat foods that contain iron, you should eat them with foods that are high in vitamin C. Vitamin C helps your body to absorb iron.  Certain foods and drinks prevent your body from absorbing iron properly, such as whole grains and dairy products. You should avoid eating these foods in the same meal as iron-rich foods or with iron supplements. This information is not intended to replace advice given to you by your health care provider. Make sure you discuss any questions you have with your health care provider. Document Revised: 07/15/2017 Document Reviewed: 06/28/2017 Elsevier Patient Education  2020 [employer].    Patient verbalizes understanding of instructions provided today.   Telephone follow up appointment with Managed Medicaid care management team member scheduled for:07/31/20 @ [place] RN, [place]  Network Electronics engineer

## 2020-07-01 NOTE — Patient Outreach (Addendum)
Care Coordination - Case Manager  07/01/2020  Sherry Anderson [birth year] [id number]  Subjective:  Sherry Anderson is an 59 y.o. year old female who is a primary patient of Sherry Anderson, Sherry Limber, MD.  Sherry Anderson was given information about Medicaid Managed Care team care coordination services today. Sherry Anderson agreed to services and verbal consent obtained  Review of patient status, laboratory and other test data was performed as part of evaluation for provision of services.  SDOH: SDOH Screenings   Alcohol Screen:   . Last Alcohol Screening Score (AUDIT): Not on file  Depression (PHQ2-9): Medium Risk  . PHQ-2 Score: 19  Financial Resource Strain:   . Difficulty of Paying Living Expenses: Not on file  Tobacco Use: Low Risk   . Smoking Tobacco Use: Never Smoker  . Smokeless Tobacco Use: Never Used     Objective:    No Known Allergies  Medications:    Medications Reviewed Today    Reviewed by Sherry Montane, RN (Registered Nurse) on 07/01/20 at 1353  Med List Status: <None>  Medication Order Taking? Sig Documenting Provider Last Dose Status Informant  acetaminophen (TYLENOL) 325 MG tablet [id number] Yes Take 650 mg by mouth every 6 (six) hours as needed. OTC PRN [provider] Taking Active   Calcium Citrate-Vitamin D (CALCIUM + D PO) [id number] Yes Take 1 tablet by mouth 2 (two) times daily. [provider] Taking Active Self  Cyanocobalamin (B-12 PO) [id number] Yes Take by mouth. [provider] Taking Active   enoxaparin (LOVENOX) 80 MG/0.8ML injection [id number] Yes Inject 0.7 mLs (70 mg total) into the skin every 12 (twelve) hours. Sherry Rio, MD Taking Active   levothyroxine (SYNTHROID) 75 MCG tablet [id number] Yes TAKE 1 TABLET (75 MCG TOTAL) BY MOUTH EVERY MORNING. Sherry Anderson Sherry Rio, MD Taking Active   lisinopril (ZESTRIL) 20 MG tablet [id number] Yes TAKE 1 TABLET BY MOUTH EVERY DAY  Sherry Rio, MD Taking Active   omeprazole (PRILOSEC) 20 MG capsule [id number] Yes Take 1 capsule (20 mg total) by mouth 2 (two) times daily. Sherry Rio, MD Taking Active   triamterene-hydrochlorothiazide (DYAZIDE) 37.5-25 MG capsule [id number] Yes TAKE 1 CAPSULE BY MOUTH EVERY DAY Sherry Rio, MD Taking Active   venlafaxine XR (EFFEXOR-XR) 75 MG 24 hr capsule [id number] Yes TAKE 3 CAPSULES BY MOUTH EVERY DAY Sherry Anderson Sherry Limber, MD Taking Active           Assessment:   Patient Care Plan: Cardiovascular Disease Management (DVT/PE)    Problem Identified: Recent DVT/PE     Long-Range Goal: Cardiovascular Management Plan Developed (DVT/PE)   Start Date: 07/01/2020  Expected End Date: 08/31/2020  This Visit's Progress: On track  Priority: High  Note:      Current Barriers:  . Chronic Disease Management support and education needs related to recent blood clots and anemia  Nurse Case Manager Clinical Goal(s):  [name], patient will attend all scheduled medical appointments . Over the next 30 days, patient will work with CM clinical social worker to discuss feelings of depression . Over the next 60 days, the patient will demonstrate ongoing self health care management ability as evidenced by taking all medications as prescribed, attending scheduled appointments and notifying PCP with any Sherry concerns/symptoms  Interventions:  . Inter-disciplinary care team collaboration (see longitudinal plan of care) . Provided education to patient re: iron rich diet . Reviewed medications with patient and discussed continuing to take  medications as prescribed and the importance. Explained that it is normal to have bruising at the injection site with lovenox. Advised to alternate injection sites . Collaborated with LCSW regarding patient having feelings of depression . Discussed plans with patient for ongoing care management follow up and provided patient with  direct contact information for care management team  Patient Goals/Self-Care Activities Over the next 60 days, patient will:  -Self administers medications as prescribed Attends all scheduled provider appointments Calls pharmacy for medication refills Performs ADL's independently Performs IADL's independently Calls provider office for Sherry concerns or questions  Follow Up Plan: Telephone follow up appointment with Managed Medicaid care management team member scheduled for: 07/31/20 @ 1pm        Plan: RNCM will follow up with a telephone visit on 07/31/20 @ [place], BSN [place]  [employer] manager

## 2020-07-01 NOTE — Patient Instructions (Signed)
Visit Information  [name]  - as a part of your Medicaid benefit, you are eligible for care management and care coordination services at no cost or copay. I was unable to reach you by phone today but would be happy to help you with your health related needs. Please feel free to call me @ [phone].   A member of the Managed Medicaid care management team will reach out to you again over the next 7-14 days.   Lurena Joiner RN, BSN [place]  [employer] manager

## 2020-07-01 NOTE — Patient Outreach (Signed)
Care Coordination  07/01/2020  Zakiah Gauthreaux 23-Jun-1961 [id number]   A second unsuccessful telephone outreach was attempted today. The patient was referred to the case management team for assistance with care management and care coordination.   Follow Up Plan: A HIPAA compliant phone message was left for the patient providing contact information and requesting a return call.  The Managed Medicaid care management team will reach out to the patient again over the next 7-14 days.   Lurena Joiner RN, BSN [place]  [employer] manager

## 2020-07-02 LAB — URINALYSIS, COMPLETE W/RFL CULTURE
Bilirubin Urine: NEGATIVE
Glucose, UA: NEGATIVE
Hgb urine dipstick: NEGATIVE
Ketones, ur: NEGATIVE
Nitrites, Initial: NEGATIVE
RBC / HPF: NONE SEEN /HPF (ref 0–2)
Specific Gravity, Urine: 1.015 (ref 1.001–1.03)
pH: 6 (ref 5.0–8.0)

## 2020-07-02 LAB — PAP IG W/ RFLX HPV ASCU

## 2020-07-02 LAB — URINE CULTURE
MICRO NUMBER:: 11203440
Result:: NO GROWTH
SPECIMEN QUALITY:: ADEQUATE

## 2020-07-02 LAB — CULTURE INDICATED

## 2020-07-03 ENCOUNTER — Other Ambulatory Visit: Payer: Self-pay

## 2020-07-03 NOTE — Patient Instructions (Signed)
An unsuccessful telephone outreach was attempted today. The patient was referred to the case management team for assistance with care management and care coordination.   Follow Up Plan: Licensed Clinical Social Worker will follow up on 07/08/2020 @ 3:00pm.  Netta Neat, BSW, MSW, [place]: [phone]

## 2020-07-03 NOTE — Patient Outreach (Signed)
Care Coordination  07/03/2020  Gwendolen Hewlett [birth year] [id number]  An unsuccessful telephone outreach was attempted today. The patient was referred to the case management team for assistance with care management and care coordination.   Follow Up Plan: Licensed Clinical Social Worker will will follow up in 7 days.  Netta Neat, BSW, MSW, LCSW Social Work Case Freight forwarder - [place]  Direct [place]: [phone]

## 2020-07-07 NOTE — Progress Notes (Signed)
I have reviewed and agreed above plan. 

## 2020-07-08 ENCOUNTER — Other Ambulatory Visit: Payer: Self-pay

## 2020-07-08 ENCOUNTER — Ambulatory Visit: Payer: Medicaid Other | Admitting: Family Medicine

## 2020-07-08 VITALS — BP 118/80 | HR 94 | Ht 63.0 in | Wt 165.6 lb

## 2020-07-08 DIAGNOSIS — F39 Unspecified mood [affective] disorder: Secondary | ICD-10-CM

## 2020-07-08 DIAGNOSIS — D649 Anemia, unspecified: Secondary | ICD-10-CM

## 2020-07-08 DIAGNOSIS — I2699 Other pulmonary embolism without acute cor pulmonale: Secondary | ICD-10-CM | POA: Diagnosis not present

## 2020-07-08 DIAGNOSIS — Z23 Encounter for immunization: Secondary | ICD-10-CM

## 2020-07-08 NOTE — Patient Outreach (Signed)
Care Coordination  07/08/2020  Sherry Anderson August 04, 1961 [id number]  A second unsuccessful telephone outreach was attempted today. The patient was referred to the case management team for assistance with care management and care coordination.   Follow Up Plan: The Managed Medicaid care management team will reach out to the patient again over the next 14 days.    Netta Neat, BSW, MSW, LCSW Social Work Case Freight forwarder - [place]  Direct [place]: [phone]

## 2020-07-08 NOTE — Assessment & Plan Note (Signed)
Doing well with lovenox therapy. Continue for at least 3 months total. Reviewed with patient that given she had PE while on eliquis, this may be an indication for lifelong anticoagulation. Offered referral to hematology to discuss, also offered that I can reach out to a hematologist via [place] to see if they have recommendations on duration of anticoagulation. She prefers I do econsult. I will do this and get back to her.

## 2020-07-08 NOTE — Assessment & Plan Note (Signed)
Hgb recently 10.6, microcytic. Recently started empirically on iron, but would ideally check iron studies first. Will obtain these today.

## 2020-07-08 NOTE — Progress Notes (Signed)
  Date of Visit: 07/08/2020   SUBJECTIVE:   HPI:  Sherry Anderson presents today for routine follow up.  PE - started on lovenox 70mg  daily about a month ago. Tolerating this well. No bleeding, does have some bruising at the injection sites, which she alternates regularly. Of note recently had CBC drawn by GYN and had Hgb of 10.6, was advised to start iron twice daily, which she has done. Has not had iron studies checked.  Depression - taking venlafaxine XR 225mg  daily. Tolerating well. Feels mood is actually improved lately. No SI/HI. Has not seen therapist.   Saw GYN for pelvic pain, has upcoming u/s and follow up appointment with them  OBJECTIVE:   BP 118/80   Pulse 94   Ht 5\' 3"  (1.6 m)   Wt 165 lb 9.6 oz (75.1 kg)   LMP  (LMP Unknown)   SpO2 98%   BMI 29.33 kg/m  Gen: no acute distress, pleasant cooperative HEENT: normocephalic, atraumatic  Heart: regular rate and rhythm, no murmur Lungs: clear to auscultation bilaterally normal work of breathing  Neuro: alert speech normal, grossly nonfocal Ext: No appreciable lower extremity edema bilaterally   ASSESSMENT/PLAN:   Health maintenance:  -COVID booster given today -current on pap -has appointment 12/1 to discuss colonoscopy with GI. Prepared patient that we may want to wait until after she has completed course of anticoagulation prior to colonoscopy. I would at least wait 3 months since she started lovenox due to recent PE dx.   Anemia Hgb recently 10.6, microcytic. Recently started empirically on iron, but would ideally check iron studies first. Will obtain these today.   Pulmonary embolism ([place]) Doing well with lovenox therapy. Continue for at least 3 months total. Reviewed with patient that given she had PE while on eliquis, this may be an indication for lifelong anticoagulation. Offered referral to hematology to discuss, also offered that I can reach out to a hematologist via [place] to see if they have recommendations on  duration of anticoagulation. She prefers I do econsult. I will do this and get back to her.  Mood disorder ([place]) Actually improved overall. Continue current dose of venlafaxine.  Did not address pelvic pain as is being evaluated by GYN - continue follow up there.  FOLLOW UP: Follow up in 3 months with me for depression  [place] J. Ardelia Mems, [place]

## 2020-07-08 NOTE — Assessment & Plan Note (Signed)
Actually improved overall. Continue current dose of venlafaxine.

## 2020-07-08 NOTE — Patient Instructions (Signed)
It was great to see you again today!  Checking your iron levels today  COVID booster today  Will message hematologist for recommendations on how long to continue your blood thinner.  Glad you are feeling better  Follow up with me in 3 months   Be well, Dr. Ardelia Mems

## 2020-07-08 NOTE — Patient Instructions (Signed)
A second unsuccessful telephone outreach was attempted today. The patient was referred to the case management team for assistance with care management and care coordination.   Follow Up Plan: Licensed Clinical Social Worker will make 3rd outreach attempt on July 22, 2020 @ 12:45pm.  Netta Neat, BSW, MSW, [place]: [phone]

## 2020-07-09 ENCOUNTER — Ambulatory Visit: Payer: Medicaid Other

## 2020-07-09 LAB — IRON AND TIBC
Iron Saturation: 45 % (ref 15–55)
Iron: 232 ug/dL — ABNORMAL HIGH (ref 27–159)
Total Iron Binding Capacity: 514 ug/dL — ABNORMAL HIGH (ref 250–450)
UIBC: 282 ug/dL (ref 131–425)

## 2020-07-09 LAB — FERRITIN: Ferritin: 48 ng/mL (ref 15–150)

## 2020-07-16 ENCOUNTER — Ambulatory Visit (INDEPENDENT_AMBULATORY_CARE_PROVIDER_SITE_OTHER): Payer: Medicaid Other | Admitting: Physician Assistant

## 2020-07-16 ENCOUNTER — Encounter: Payer: Self-pay | Admitting: Physician Assistant

## 2020-07-16 VITALS — BP 142/68 | HR 95 | Ht 63.0 in | Wt 165.8 lb

## 2020-07-16 DIAGNOSIS — R1013 Epigastric pain: Secondary | ICD-10-CM | POA: Diagnosis not present

## 2020-07-16 DIAGNOSIS — Z9289 Personal history of other medical treatment: Secondary | ICD-10-CM

## 2020-07-16 NOTE — Patient Instructions (Signed)
If you are age 60 or older, your body mass index should be between 23-30. Your Body mass index is 29.37 kg/m. If this is out of the aforementioned range listed, please consider follow up with your Primary Care Provider.  If you are age 12 or younger, your body mass index should be between 19-25. Your Body mass index is 29.37 kg/m. If this is out of the aformentioned range listed, please consider follow up with your Primary Care Provider.   You have been scheduled for a CT scan of the abdomen and pelvis at [facility], 1st floor Radiology. You are scheduled on 07/29/20  at 10:00 am. You should arrive 15 minutes prior to your appointment time for registration.  Please pick up 2 bottles of contrast from [place] at least 3 days prior to your scan. The solution may taste better if refrigerated, but do NOT add ice or any other liquid to this solution. Shake well before drinking.   Please follow the written instructions below on the day of your exam:   1) Do not eat anything after 6:00 am (4 hours prior to your test)   2) Drink 1 bottle of contrast @ 8:00 am (2 hours prior to your exam)  Remember to shake well before drinking and do NOT pour over ice.     Drink 1 bottle of contrast @ 9:00 am (1 hour prior to your exam)   You may take any medications as prescribed with a small amount of water, if necessary. If you take any of the following medications: METFORMIN, GLUCOPHAGE, GLUCOVANCE, AVANDAMET, RIOMET, FORTAMET, [place] MET, JANUMET, GLUMETZA or METAGLIP, you MAY be asked to HOLD this medication 48 hours AFTER the exam.   The purpose of you drinking the oral contrast is to aid in the visualization of your intestinal tract. The contrast solution may cause some diarrhea. Depending on your individual set of symptoms, you may also receive an intravenous injection of x-ray contrast/dye. Plan on being at [facility] for 45 minutes or longer, depending on the type of exam you are having performed.    If you have any questions regarding your exam or if you need to reschedule, you may call [facility] at [phone] between the hours of 8:00 am and 5:00 pm, Monday-Friday.    You have been scheduled to follow up with Dr. Silverio Decamp on September 24, 2019 at 11:00  Thank you for entrusting me with your care and choosing [facility].  Amy Esterwood, PA-C

## 2020-07-16 NOTE — Progress Notes (Signed)
Subjective:    Patient ID: Sherry Anderson, female    DOB: 10-28-1960, 59 y.o.   MRN: [id number]  HPI  Sherry Anderson is a pleasant 59 year old female, established with Dr. Rush Landmark, who was last seen in 2018 when she underwent EGD and colonoscopy. She comes in today to discuss follow-up colonoscopy, however she has been on Lovenox since mid-October with new diagnosis of a pulmonary embolism. At colonoscopy in 2018 patient was found to have 4 polyps, the largest 5 to 8 mm in size, a few diverticuli and small internal hemorrhoids.  Path on the polyps consistent with tubular adenomas and she was recommended for 3-year interval follow-up. Patient has history of right trigeminal neuralgia and had undergone a right microvascular decompression procedure in June 2021 and then experienced leakage from development of a pseudomeningocele, course complicated by right Bell's palsy.  She required a VP shunt placement, PICC line and IV antibiotics.  She subsequently developed a DVT which was initially treated with Eliquis. In early October she had been seen by her PCP and was complaining of some chest discomfort and shortness of breath.  She had CT angio done that showed a small pulmonary embolism.  She is now on Lovenox, and thinks she will be on this for 3 months but is not certain whether or not it may be 6 months. She has recently over the past 3 months or so noted some lower abdominal discomfort more in the right sided at times around into her back.  She says this can be acute at times and quite uncomfortable and has been coming and going.  Her bowel movements have been normal no melena or hematochezia.  No nausea or vomiting and appetite has been fine. Other medical problems include hypertension, GERD, hyperlipidemia, remote history of breast cancer in 2001 and she had uterine cancer and is status post hysterectomy and BSO in 2019.  Most recent labs 06/30/2020 hemoglobin 10.8 MCV of 76, ferritin 48 serum iron  45/TIBC 282 and iron saturation of 451, creatinine 1.0  Review of Systems.Pertinent positive and negative review of systems were noted in the above HPI section.  All other review of systems was otherwise negative.  Outpatient Encounter Medications as of 07/16/2020  Medication Sig  . acetaminophen (TYLENOL) 325 MG tablet Take 650 mg by mouth every 6 (six) hours as needed. OTC PRN  . Calcium Citrate-Vitamin D (CALCIUM + D PO) Take 1 tablet by mouth 2 (two) times daily.  . Cyanocobalamin (B-12 PO) Take by mouth.  . enoxaparin (LOVENOX) 80 MG/0.8ML injection Inject 0.7 mLs (70 mg total) into the skin every 12 (twelve) hours.  . ferrous sulfate 325 (65 FE) MG tablet Take 325 mg by mouth 2 (two) times daily with a meal.  . levothyroxine (SYNTHROID) 75 MCG tablet TAKE 1 TABLET (75 MCG TOTAL) BY MOUTH EVERY MORNING. Sherry Anderson  . lisinopril (ZESTRIL) 20 MG tablet TAKE 1 TABLET BY MOUTH EVERY DAY  . omeprazole (PRILOSEC) 20 MG capsule Take 1 capsule (20 mg total) by mouth 2 (two) times daily.  Marland Kitchen triamterene-hydrochlorothiazide (DYAZIDE) 37.5-25 MG capsule TAKE 1 CAPSULE BY MOUTH EVERY DAY  . venlafaxine XR (EFFEXOR-XR) 75 MG 24 hr capsule TAKE 3 CAPSULES BY MOUTH EVERY DAY   No facility-administered encounter medications on file as of 07/16/2020.   No Known Allergies Patient Active Problem List   Diagnosis Date Noted  . Pulmonary embolism ([place]) 06/04/2020  . Dvt femoral (deep venous thrombosis) ([place]) 04/23/2020  . Syncope 09/24/2019  .  Right trigeminal neuralgia 09/13/2019  . GERD (gastroesophageal reflux disease) 09/28/2018  . Dyspnea 09/28/2018  . Dental caries 06/21/2018  . Radiculopathy 04/13/2018  . Subclinical hypothyroidism 11/18/2017  . Atypical cervical glandular cells 04/21/2017  . Right-sided face pain 03/02/2017  . Tremor of right hand 11/07/2015  . Right-sided sensorineural hearing loss 11/13/2014  . DDD (degenerative disc disease), lumbar 10/21/2014  . Knee pain,  bilateral 11/06/2013  . Left wrist pain 03/02/2013  . Anemia 01/25/2012  . Right ear pain 01/19/2012  . HEADACHE 02/04/2010  . Hyperlipidemia 08/07/2009  . HEARING LOSS, RIGHT EAR 01/17/2008  . CARPAL TUNNEL SYNDROME, BILATERAL 07/31/2007  . LATERAL EPICONDYLITIS, BILATERAL 07/18/2007  . Fatigue due to depression 04/06/2007  . Gastritis and gastroduodenitis 11/21/2006  . Mood disorder ([place]) 10/17/2006  . HYPERTENSION, BENIGN ESSENTIAL 10/17/2006  . CA IN SITU, BREAST 02/15/2001   Social History   Socioeconomic History  . Marital status: Legally Separated    Spouse name: Not on file  . Number of children: 3  . Years of education: 6  . Highest education level: Not on file  Occupational History  . Occupation: Disability  Tobacco Use  . Smoking status: Never Smoker  . Smokeless tobacco: Never Used  Vaping Use  . Vaping Use: Never used  Substance and Sexual Activity  . Alcohol use: No  . Drug use: No  . Sexual activity: Not Currently    Birth control/protection: Post-menopausal    Comment: 1st intercourse 59 yo-Fewer than 5 partners  Other Topics Concern  . Not on file  Social History Narrative   Moved from [place] to [place], to [place] at home with her nephew and her son.   Right-handed.   Occasional use of caffeine.   Social Determinants of Health   Financial Resource Strain:   . Difficulty of Paying Living Expenses: Not on file  Food Insecurity:   . Worried About Charity fundraiser in the Last Year: Not on file  . Ran Out of Food in the Last Year: Not on file  Transportation Needs:   . Lack of Transportation (Medical): Not on file  . Lack of Transportation (Non-Medical): Not on file  Physical Activity:   . Days of Exercise per Week: Not on file  . Minutes of Exercise per Session: Not on file  Stress:   . Feeling of Stress : Not on file  Social Connections:   . Frequency of Communication with Friends and Family: Not on file  . Frequency of  Social Gatherings with Friends and Family: Not on file  . Attends Religious Services: Not on file  . Active Member of Clubs or Organizations: Not on file  . Attends Archivist Meetings: Not on file  . Marital Status: Not on file  Intimate Partner Violence:   . Fear of Current or Ex-Partner: Not on file  . Emotionally Abused: Not on file  . Physically Abused: Not on file  . Sexually Abused: Not on file    Sherry Anderson's family history includes Diabetes in her mother; Heart disease in her father; Hypertension in her mother; Schizophrenia in her brother; Thyroid disease in her sister.      Objective:    Vitals:   07/16/20 1055  BP: (!) 142/68  Pulse: 95    Physical Exam Well-developed well-nourished  Older  female  in no acute distress.  Height, Weight,165 BMI 29.3  HEENT; nontraumatic normocephalic, EOMI, PER R LA, sclera anicteric. Oropharynx;not  done Neck; supple, no JVD Cardiovascular; regular rate and rhythm with S1-S2, sift syst murmur Pulmonary; Clear bilaterally Abdomen; soft, she is tender in the epigastrium and right mid quadrant, nondistended, no palpable mass or hepatosplenomegaly, bowel sounds are active, Rectal; not done today Skin; benign exam, no jaundice rash or appreciable lesions Extremities; no clubbing cyanosis or edema skin warm and dry Neuro/Psych; alert and oriented x4, grossly nonfocal mood and affect appropriate       Assessment & Plan:   #81 59 year old female with history of tubular adenomatous colon polyps, due for follow-up colonoscopy. Last colonoscopy 2018 with removal of 4 polyps, largest 8 mm and indicated for 3-year interval follow-up. 41-month history of right mid abdominal pain sometimes radiating into the back, intermittent.  Etiology not clear 2.  History of pseudomeningocele, Bell's palsy and VP shunt placement June 2021 3.  Right trigeminal neuralgia 4.  DVT July 2021 5.  Diagnosed with PE October 2021 now on Lovenox 6.   Remote history of breast cancer 7.  History of uterine cancer status post hysterectomy BSO 8.  GERD 9.  Hypertension  Plan; Discussed timing of colonoscopy with patient.  It is not appropriate to take her off of Lovenox for surveillance colonoscopy.  Advised we would plan to proceed with follow-up colonoscopy after she completes her current course of Lovenox, whether that is 3 months or 6 months.  We will proceed with CT of the abdomen and pelvis to further evaluate her new abdominal pain. Continue omeprazole 20 mg p.o. twice daily Plan office follow-up with Dr. Silverio Decamp in February 2022, and discuss colonoscopy at that time. Further recommendations pending results of CT.   Bowman Higbie Genia Harold PA-C 07/16/2020   Cc: Leeanne Rio, MD

## 2020-07-18 ENCOUNTER — Encounter: Payer: Self-pay | Admitting: Family Medicine

## 2020-07-22 ENCOUNTER — Other Ambulatory Visit: Payer: Self-pay

## 2020-07-22 ENCOUNTER — Ambulatory Visit: Payer: Self-pay

## 2020-07-22 NOTE — Patient Outreach (Signed)
Care Coordination- Social Work  07/23/2020  Sherry Anderson 09-19-1960 [id number]  Subjective:    Sherry Anderson is an 59 y.o. year old female who is a primary patient of Ardelia Mems, Delorse Limber, MD.    Sherry Anderson was given information about Medicaid Managed Care team care coordination services today. [name] agreed to services and verbal consent obtained  Review of patient status, laboratory and other test data was performed as part of evaluation for provision of services.  SDOH:   SDOH Screenings   Alcohol Screen: Low Risk   . Last Alcohol Screening Score (AUDIT): 0  Depression (PHQ2-9): Medium Risk  . PHQ-2 Score: 13  Financial Resource Strain: Low Risk   . Difficulty of Paying Living Expenses: Not very hard  Food Insecurity: No Food Insecurity  . Worried About Charity fundraiser in the Last Year: Never true  . Ran Out of Food in the Last Year: Never true  Housing: Low Risk   . Last Housing Risk Score: 0  Physical Activity: Insufficiently Active  . Days of Exercise per Week: 3 days  . Minutes of Exercise per Session: 30 min  Social Connections: Moderately Isolated  . Frequency of Communication with Friends and Family: More than three times a week  . Frequency of Social Gatherings with Friends and Family: Once a week  . Attends Religious Services: More than 4 times per year  . Active Member of Clubs or Organizations: No  . Attends Archivist Meetings: Never  . Marital Status: Separated  Stress: No Stress Concern Present  . Feeling of Stress : Only a little  Tobacco Use: Low Risk   . Smoking Tobacco Use: Never Smoker  . Smokeless Tobacco Use: Never Used  Transportation Needs: No Transportation Needs  . Lack of Transportation (Medical): No  . Lack of Transportation (Non-Medical): No   SDOH Interventions     Most Recent Value  SDOH Interventions  Food Insecurity Interventions Intervention Not Indicated  Financial Strain Interventions  Intervention Not Indicated  Housing Interventions Intervention Not Indicated  Intimate Partner Violence Interventions Intervention Not Indicated  Physical Activity Interventions Intervention Not Indicated  Stress Interventions Intervention Not Indicated  Social Connections Interventions Intervention Not Indicated  Transportation Interventions Intervention Not Indicated  Alcohol Brief Interventions/Follow-up AUDIT Score <7 follow-up not indicated  Depression Interventions/Treatment  Currently on Treatment, Referral to Psychiatry, Counseling  [Patient currently takes medication that is prescribed by her PCP. She requests to be referred to a psychiatrist and a therapist. Referal made.]      Objective:    Medications:  Medications Reviewed Today    Reviewed by Netta Neat, LCSW (Social Worker) on 07/22/20 at 1307  Med List Status: <None>  Medication Order Taking? Sig Documenting Provider Last Dose Status Informant  acetaminophen (TYLENOL) 325 MG tablet [id number] Yes Take 650 mg by mouth every 6 (six) hours as needed. OTC PRN [provider] Taking Active   Calcium Citrate-Vitamin D (CALCIUM + D PO) [id number] Yes Take 1 tablet by mouth 2 (two) times daily. [provider] Taking Active Self  Cyanocobalamin (B-12 PO) [id number] Yes Take by mouth. [provider] Taking Active   enoxaparin (LOVENOX) 80 MG/0.8ML injection [id number] Yes Inject 0.7 mLs (70 mg total) into the skin every 12 (twelve) hours. Leeanne Rio, MD Taking Active   ferrous sulfate 325 (65 FE) MG tablet [id number] Yes Take 325 mg by mouth 2 (two) times daily with a meal. [provider] Taking Active   levothyroxine (  SYNTHROID) 75 MCG tablet [id number] Yes TAKE 1 TABLET (75 MCG TOTAL) BY MOUTH EVERY MORNING. [place] Leeanne Rio, MD Taking Active   lisinopril (ZESTRIL) 20 MG tablet [id number] Yes TAKE 1 TABLET BY MOUTH EVERY DAY Leeanne Rio, MD Taking  Active   omeprazole (PRILOSEC) 20 MG capsule [id number] Yes Take 1 capsule (20 mg total) by mouth 2 (two) times daily. Leeanne Rio, MD Taking Active   triamterene-hydrochlorothiazide (DYAZIDE) 37.5-25 MG capsule [id number] Yes TAKE 1 CAPSULE BY MOUTH EVERY DAY Leeanne Rio, MD Taking Active   venlafaxine XR (EFFEXOR-XR) 75 MG 24 hr capsule [id number] Yes TAKE 3 CAPSULES BY MOUTH EVERY DAY Leeanne Rio, MD Taking Active           Fall/Depression Screening:  Fall Risk  09/24/2019 09/04/2019 09/29/2018  Falls in the past year? 0 0 0  Number falls in past yr: 0 0 -   PHQ 2/9 Scores 07/22/2020 07/08/2020 05/12/2020 04/23/2020 09/24/2019 09/04/2019 08/23/2019  PHQ - 2 Score 4 3 6 5 5 5 6   PHQ- 9 Score 13 10 19 17 10 9  -    Assessment:  Goals Addressed            This Visit's Progress   . Mental health needs       Current Barriers:  . Chronic Mental Health needs related to depression. . Social Isolation . Lacks knowledge of community resource: Patient was given a list by her PCP of community health providers but she does not know where to start with identifying the appropriate provider.  . Suicidal Ideation/Homicidal Ideation: No  Clinical Social Work Goal(s):  Marland Kitchen Over the next 60 days, patient will work with SW monthly by telephone or in person to reduce or manage symptoms related to depression. . Over the next 60 days, patient will work with BSW to address concerns related to scheduling appointments for therapy and medication management.  Interventions: . Patient interviewed and appropriate assessments performed: PHQ 2 and PHQ 9 . Patient interviewed and appropriate assessments performed . Provided mental health counseling with regard to depression and the effects of trauma   . Provided patient with information about the connection between mental health and physical health. [name] with RN Case Manager re: patient's mental health needs. [name] with BSW  re: scheduling patient for therapy at [facility] the [facility] [phone], [phone]. Marland Kitchen Emotional/Supportive Counseling . Referred to psychiatrist . Referred to counselor/psychotherapist  Patient Self Care Activities:  . Self administers medications as prescribed . Attends all scheduled provider appointments . Calls pharmacy for medication refills . Performs ADL's independently . Performs IADL's independently . Ability for insight . Independent living . Motivation for treatment  Patient Coping Strengths:  . Spirituality . Hopefulness . Self Advocate . Able to Communicate Effectively  Patient Self Care Deficits:  . Lacks social connections        Plan: The Managed Medicaid Care Team will follow up with patient in 14 days. Follow-up:  Patient agrees to Care Plan and Follow-up.   Netta Neat, BSW, MSW, LCSW Social Work Case Freight forwarder - [place]  Direct [place]: [phone]

## 2020-07-22 NOTE — Patient Instructions (Addendum)
Visit Information: [name], it was a pleasure speaking with you today. Please remember that a member of our Managed Medicaid Care Team will contact you regarding scheduling you for outpatient therapy and psychiatry/medication management. If you have any questions, please contact me at [phone].  [name] was given information about Medicaid Managed Care team care coordination services as a part of their Healthy [facility] Medicaid benefit. Sherry Anderson verbally consented to engagement with the [facility] Managed Care team.   For questions related to your Healthy [facility] health plan, please call: [phone] or visit the homepage here: [email or web address]  If you would like to schedule transportation through your Healthy [facility] plan, please call the following number at least 2 days in advance of your appointment: [phone]   Patient Care Plan: Cardiovascular Disease Management (DVT/PE)    Problem Identified: Recent DVT/PE     Long-Range Goal: Cardiovascular Management Plan Developed (DVT/PE)   Start Date: 07/01/2020  Expected End Date: 08/31/2020  This Visit's Progress: On track  Priority: High  Note:      Current Barriers:  . Chronic Disease Management support and education needs related to recent blood clots and anemia  Nurse Case Manager Clinical Goal(s):  [name], patient will attend all scheduled medical appointments . Over the next 30 days, patient will work with CM clinical social worker to discuss feelings of depression . Over the next 60 days, the patient will demonstrate ongoing self health care management ability as evidenced by taking all medications as prescribed, attending scheduled appointments and notifying PCP with any new concerns/symptoms  Interventions:  . Inter-disciplinary care team collaboration (see longitudinal plan of care) . Provided education to patient re: iron rich  diet . Reviewed medications with patient and discussed continuing to take medications as prescribed and the importance. Explained that it is normal to have bruising at the injection site with lovenox. Advised to alternate injection sites . Collaborated with LCSW regarding patient having feelings of depression . Discussed plans with patient for ongoing care management follow up and provided patient with direct contact information for care management team  Patient Goals/Self-Care Activities Over the next 60 days, patient will:  -Self administers medications as prescribed Attends all scheduled provider appointments Calls pharmacy for medication refills Performs ADL's independently Performs IADL's independently Calls provider office for new concerns or questions  Follow Up Plan: Telephone follow up appointment with Managed Medicaid care management team member scheduled for: 07/31/20 @ 1pm     Patient Care Plan: Social Work Plan for Depression (Adult)    Problem Identified: Symptoms (Depression)     Long-Range Goal: Symptoms Monitored and Managed   Start Date: 07/22/2020  Expected End Date: 09/20/2020  This Visit's Progress: On track  Priority: High  Note:   Current Barriers:  . Chronic Mental Health needs related to depression. . Social Isolation . Lacks knowledge of community resource: Patient was given a list by her PCP of community health providers but she does not know where to start with identifying the appropriate provider.  . Suicidal Ideation/Homicidal Ideation: No  Clinical Social Work Goal(s):  Marland Kitchen Over the next 60 days, patient will work with SW monthly by telephone or in person to reduce or manage symptoms related to depression. . Over the next 60 days, patient will work with BSW to address concerns related to scheduling appointments for therapy and medication management.  Interventions: . Patient interviewed and appropriate assessments performed: PHQ 2 and PHQ 9 . Patient  interviewed and appropriate assessments performed . Provided mental health counseling with regard to depression and the effects of trauma   . Provided patient with information about the connection between mental health and physical health. [name] with RN Case Manager re: patient's mental health needs. [name] with BSW re: scheduling patient for therapy at [facility] of the [facility] [phone], [phone]. Marland Kitchen Emotional/Supportive Counseling . Referred to psychiatrist . Referred to counselor/psychotherapist  Patient Self Care Activities:  . Self administers medications as prescribed . Attends all scheduled provider appointments . Calls pharmacy for medication refills . Performs ADL's independently . Performs IADL's independently . Ability for insight . Independent living . Motivation for treatment  Patient Coping Strengths:  . Spirituality . Hopefulness . Self Advocate . Able to Communicate Effectively  Patient Self Care Deficits:  . Lacks social connections  Initial goal documentation    Long-Range Goal: Track and Manage My Symptoms-Depression   Start Date: 07/22/2020  Expected End Date: 09/20/2020  This Visit's Progress: On track  Priority: High  Note:   Timeframe:  Long-Range Goal Priority:  High Start Date:  07/22/2020                           Expected End Date:   02/05/202                    Follow Up Date 08/19/2020 @ 9:00am   - exercise at least 2 to 3 times per week - have a plan for how to handle bad days - watch for early signs of feeling worse    Why is this important?    Keeping track of your progress will help your treatment team find the right mix of medicine and therapy for you.   Write in your journal every day.   Day-to-day changes in depression symptoms are normal. It may be more helpful to check your progress at the end of each week instead of every day.     Notes:        The patient verbalized  understanding of instructions provided today and agreed to receive a mailed copy of patient instruction and/or educational materials.  Licensed Clinical Social Worker will follow up on August 19, 2020 @ 9:00am. The Managed Medicaid care management team will reach out to the patient again over the next 14 days.  The patient has been provided with contact information for the Managed Medicaid care management team and has been advised to call with any health related questions or concerns.   Netta Neat, LCSW Netta Neat, BSW, MSW, LCSW Social Work Case Freight forwarder - [place]  Direct [place]: [phone]

## 2020-07-23 NOTE — Progress Notes (Signed)
Reviewed and agree with documentation and assessment and plan. K. Veena Coretta Leisey , MD   

## 2020-07-24 ENCOUNTER — Other Ambulatory Visit: Payer: Self-pay

## 2020-07-24 ENCOUNTER — Ambulatory Visit: Payer: Medicaid Other | Admitting: Obstetrics & Gynecology

## 2020-07-24 ENCOUNTER — Ambulatory Visit (INDEPENDENT_AMBULATORY_CARE_PROVIDER_SITE_OTHER): Payer: Medicaid Other

## 2020-07-24 DIAGNOSIS — Z90722 Acquired absence of ovaries, bilateral: Secondary | ICD-10-CM | POA: Diagnosis not present

## 2020-07-24 DIAGNOSIS — Z9079 Acquired absence of other genital organ(s): Secondary | ICD-10-CM

## 2020-07-24 DIAGNOSIS — Z90710 Acquired absence of both cervix and uterus: Secondary | ICD-10-CM

## 2020-07-24 DIAGNOSIS — R1030 Lower abdominal pain, unspecified: Secondary | ICD-10-CM

## 2020-07-24 NOTE — Progress Notes (Signed)
    Sherry Anderson 1961/02/17 [id number]        59 y.o.  G2P2L3  Lost both her mother and father last year.  RP: Lower abdominal pain intermittently for Pelvic US  HPI: S/P Robotic TLH/BSO for Cervical AdenoCarcinoma in situ in 09/2017.   Lower abdominal/suprapubic pain intermittently x 3 months.  No urinary frequency or pain with miction.  Bowel movements normal per patient.  No fever.  Not very physically active.   OB History  Gravida Para Term Preterm AB Living  2         3  SAB IAB Ectopic Multiple Live Births        1      # Outcome Date GA Lbr Len/2nd Weight Sex Delivery Anes PTL Lv  2 Gravida           1 Gravida             Past medical history,surgical history, problem list, medications, allergies, family history and social history were all reviewed and documented in the EPIC chart.   Directed ROS with pertinent positives and negatives documented in the history of present illness/assessment and plan.  Exam:  There were no vitals filed for this visit. General appearance:  Normal  Pelvic US today: T/V images.  Status post total hysterectomy with BSO.  Vaginal cuff appears normal.  Bilateral adnexa appear negative.  Scant, clear free fluid at the midline towards the left of the vaginal cuff.  No pelvic mass seen.   Assessment/Plan:  59 y.o. G2P0   1. Lower abdominal pain Pelvic ultrasound findings reviewed with patient.  Patient reassured that no pelvic mass or cyst were seen.  Pelvic discomfort probably of intestinal origin.  Will try to change her nutrition.  Probiotics recommended.  If no improvements or worsening of pain, patient will further investigate with her family physician.  2. S/P total hysterectomy and BSO (bilateral salpingo-oophorectomy)  Princess Bruins MD, 4:39 PM 07/24/2020

## 2020-07-25 ENCOUNTER — Other Ambulatory Visit: Payer: Self-pay

## 2020-07-25 NOTE — Patient Outreach (Signed)
Care Coordination  07/25/2020  [name] 15-Feb-1961 [id number]   Medicaid Managed Care   Unsuccessful Outreach Note  07/25/2020 Name: Sherry Anderson MRN: [id number] DOB: 18-Jul-1961  Referred by: Leeanne Rio, MD Reason for referral : High Risk Managed Medicaid (HR MM Unsuccessful Telephone Outreach)   An unsuccessful telephone outreach was attempted today. The patient was referred to the case management team for assistance with care management and care coordination.   Follow Up Plan: BSW will follow up in 7 days.   Mickel Fuchs, BSW, [place]  High Risk Managed Medicaid Team

## 2020-07-25 NOTE — Patient Instructions (Signed)
Visit Information  [name]  - as a part of your Medicaid benefit, you are eligible for care management and care coordination services at no cost or copay. I was unable to reach you by phone today but would be happy to help you with your health related needs. Please feel free to call me @ [phone].   A member of the Managed Medicaid care management team will reach out to you again over the next 7 days.   Mickel Fuchs, BSW, [place]  High Risk Managed Medicaid Team

## 2020-07-28 ENCOUNTER — Encounter: Payer: Self-pay | Admitting: Obstetrics & Gynecology

## 2020-07-29 ENCOUNTER — Ambulatory Visit (HOSPITAL_COMMUNITY): Payer: Medicaid Other

## 2020-07-31 ENCOUNTER — Other Ambulatory Visit: Payer: Self-pay

## 2020-07-31 ENCOUNTER — Other Ambulatory Visit: Payer: Self-pay | Admitting: *Deleted

## 2020-07-31 NOTE — Patient Instructions (Signed)
Visit Information  Sherry Anderson was given information about Medicaid Managed Care team care coordination services as a part of their Healthy Blue Medicaid benefit. Sherry Anderson verbally consented to engagement with the [facility] Managed Care team.   For questions related to your Healthy [facility] health plan, please call: [phone] or visit the homepage here: [email or web address]  If you would like to schedule transportation through your Healthy [facility] plan, please call the following number at least 2 days in advance of your appointment: [phone]   Patient Care Plan: Cardiovascular Disease Management (DVT/PE)    Problem Identified: Recent DVT/PE     Long-Range Goal: Cardiovascular Management Plan Developed (DVT/PE)   Start Date: 07/01/2020  Expected End Date: 08/31/2020  Recent Progress: On track  Priority: High  Note:      Current Barriers:  . Chronic Disease Management support and education needs related to recent blood clots and anemia  Nurse Case Manager Clinical Goal(s):  [name], patient will attend all scheduled medical appointments-Met . Over the next 30 days, patient will work with CM clinical social worker to discuss feelings of depression-Met . Over the next 60 days, the patient will demonstrate ongoing self health care management ability as evidenced by taking all medications as prescribed, attending scheduled appointments and notifying PCP with any new concerns/symptoms  Interventions:  . Inter-disciplinary care team collaboration (see longitudinal plan of care) . Provided education to patient re: iron rich diet; discussed iron rich foods and ways to incorporate these choices into her diet . Reviewed medications with patient and discussed continuing to take medications as prescribed and the importance. Explained that it is normal to have bruising at the injection site with lovenox. Advised to  alternate injection sites. Discussed Dr. Assunta Curtis recommendation for taking probiotics. Marland Kitchen Collaborated with LCSW regarding patient having feelings of depression . Discussed plans with patient for ongoing care management follow up and provided patient with direct contact information for care management team . Encouraged Sherry Anderson to get outside for 10-15 minutes everyday and take a walk . Discussed the ease of ordering online for supplements and household supplies . Reviewed upcoming appointments on 12/23 for CT, 08/29/20 with Dr. Dellis Filbert and 09/23/20 with GI to discuss scheduling colonoscopy  Patient Goals/Self-Care Activities Over the next 60 days, patient will:  -Self administers medications as prescribed Attends all scheduled provider appointments Calls pharmacy for medication refills Performs ADL's independently Performs IADL's independently Calls provider office for new concerns or questions  Follow Up Plan: Telephone follow up appointment with Managed Medicaid care management team member scheduled for: 09/16/20 @ 1pm       Please see education materials related to feeling down provided by MyChart link.   10 LITTLE Things To Do When You're Feeling Too Down To Do Anything  Take a shower. Even if you plan to stay in all day long and not see a soul, take a shower. It takes the most effort to hop in to the shower but once you do, you'll feel immediate results. It will wake you up and you'll be feeling much fresher (and cleaner too).  Brush and floss your teeth. Give your teeth a good brushing with a floss finish. It's a small task but it feels so good and you can check 'taking care of your health' off the list of things to do.  Do something small on your list. Most of [place] have some small thing we would like to get done (load of laundry,  sew a button, email a friend). Doing one of these things will make you feel like you've accomplished something.  Drink water. Drinking water is easy  right? It's also really beneficial for your health so keep a glass beside you all day and take sips often. It gives you energy and prevents you from boredom eating.  Do some floor exercises. The last thing you want to do is exercise but it might be just the thing you need the most. Keep it simple and do exercises that involve sitting or laying on the floor. Even the smallest of exercises release chemicals in the brain that make you feel good. Yoga stretches or core exercises are going to make you feel good with minimal effort.  Make your bed. Making your bed takes a few minutes but it's productive and you'll feel relieved when it's done. An unmade bed is a huge visual reminder that you're having an unproductive day. Do it and consider it your housework for the day.  Put on some nice clothes. Take the sweatpants off even if you don't plan to go anywhere. Put on clothes that make you feel good. Take a look in the mirror so your brain recognizes the sweatpants have been replaced with clothes that make you look great. It's an instant confidence booster.  Wash the dishes. A pile of dirty dishes in the sink is a reflection of your mood. It's possible that if you wash up the dishes, your mood will follow suit. It's worth a try.  Cook a real meal. If you have the luxury to have a "do nothing" day, you have time to make a real meal for yourself. Make a meal that you love to eat. The process is good to get you out of the funk and the food will ensure you have more energy for tomorrow.  Write out your thoughts by hand. When you hand write, you stimulate your brain to focus on the moment that you're in so make yourself comfortable and write whatever comes into your mind. Put those thoughts out on paper so they stop spinning around in your head. Those thoughts might be the very thing holding you down.    Patient verbalizes understanding of instructions provided today.   Telephone follow up appointment  with Managed Medicaid care management team member scheduled for:09/16/20 @ [place], RN

## 2020-07-31 NOTE — Patient Outreach (Signed)
Care Coordination - Case Manager  07/31/2020  Sherry Anderson 05/08/1961 [id number]  Subjective:  Sherry Anderson is an 59 y.o. year old female who is a primary patient of Ardelia Mems, Delorse Limber, MD.  [name] was given information about Medicaid Managed Care team care coordination services today. [name] agreed to services and verbal consent obtained  Review of patient status, laboratory and other test data was performed as part of evaluation for provision of services.  SDOH: SDOH Screenings   Alcohol Screen: Low Risk   . Last Alcohol Screening Score (AUDIT): 0  Depression (PHQ2-9): Medium Risk  . PHQ-2 Score: 13  Financial Resource Strain: Low Risk   . Difficulty of Paying Living Expenses: Not very hard  Food Insecurity: No Food Insecurity  . Worried About Charity fundraiser in the Last Year: Never true  . Ran Out of Food in the Last Year: Never true  Housing: Low Risk   . Last Housing Risk Score: 0  Physical Activity: Insufficiently Active  . Days of Exercise per Week: 3 days  . Minutes of Exercise per Session: 30 min  Social Connections: Moderately Isolated  . Frequency of Communication with Friends and Family: More than three times a week  . Frequency of Social Gatherings with Friends and Family: Once a week  . Attends Religious Services: More than 4 times per year  . Active Member of Clubs or Organizations: No  . Attends Archivist Meetings: Never  . Marital Status: Separated  Stress: No Stress Concern Present  . Feeling of Stress : Only a little  Tobacco Use: Low Risk   . Smoking Tobacco Use: Never Smoker  . Smokeless Tobacco Use: Never Used  Transportation Needs: No Transportation Needs  . Lack of Transportation (Medical): No  . Lack of Transportation (Non-Medical): No     Objective:    No Known Allergies  Medications:    Medications Reviewed Today    Reviewed by Melissa Montane, RN (Registered Nurse) on 07/31/20 at  33  Med List Status: <None>  Medication Order Taking? Sig Documenting Provider Last Dose Status Informant  acetaminophen (TYLENOL) 325 MG tablet [id number] Yes Take 650 mg by mouth every 6 (six) hours as needed. OTC PRN [provider] Taking Active   Calcium Citrate-Vitamin D (CALCIUM + D PO) [id number] Yes Take 1 tablet by mouth 2 (two) times daily. [provider] Taking Active Self           Med Note (Sherry Anderson A   Thu Jul 31, 2020  1:15 PM) Taking once daily  Cyanocobalamin (B-12 PO) [id number] No Take by mouth.  Patient not taking: Reported on 07/31/2020   [provider] Not Taking Active            Med Note (Tarence Searcy A   Thu Jul 31, 2020  1:16 PM) Ran out  enoxaparin (LOVENOX) 80 MG/0.8ML injection [id number] Yes Inject 0.7 mLs (70 mg total) into the skin every 12 (twelve) hours. Leeanne Rio, MD Taking Active   ferrous sulfate 325 (65 FE) MG tablet [id number] Yes Take 325 mg by mouth 2 (two) times daily with a meal. [provider] Taking Active   levothyroxine (SYNTHROID) 75 MCG tablet [id number] Yes TAKE 1 TABLET (75 MCG TOTAL) BY MOUTH EVERY MORNING. [place] Leeanne Rio, MD Taking Active   lisinopril (ZESTRIL) 20 MG tablet [id number] Yes TAKE 1 TABLET BY MOUTH EVERY DAY Ardelia Mems Delorse Limber, MD Taking Active  omeprazole (PRILOSEC) 20 MG capsule [id number] Yes Take 1 capsule (20 mg total) by mouth 2 (two) times daily. Leeanne Rio, MD Taking Active   triamterene-hydrochlorothiazide (DYAZIDE) 37.5-25 MG capsule [id number] Yes TAKE 1 CAPSULE BY MOUTH EVERY DAY Leeanne Rio, MD Taking Active   venlafaxine XR (EFFEXOR-XR) 75 MG 24 hr capsule [id number] Yes TAKE 3 CAPSULES BY MOUTH EVERY DAY Ardelia Mems Delorse Limber, MD Taking Active           Assessment:   Patient Care Plan: Cardiovascular Disease Management (DVT/PE)    Problem Identified: Recent DVT/PE     Long-Range Goal: Cardiovascular Management  Plan Developed (DVT/PE)   Start Date: 07/01/2020  Expected End Date: 08/31/2020  Recent Progress: On track  Priority: High  Note:      Current Barriers:  . Chronic Disease Management support and education needs related to recent blood clots and anemia  Nurse Case Manager Clinical Goal(s):  [name], patient will attend all scheduled medical appointments-Met . Over the next 30 days, patient will work with CM clinical social worker to discuss feelings of depression-Met . Over the next 60 days, the patient will demonstrate ongoing self health care management ability as evidenced by taking all medications as prescribed, attending scheduled appointments and notifying PCP with any new concerns/symptoms  Interventions:  . Inter-disciplinary care team collaboration (see longitudinal plan of care) . Provided education to patient re: iron rich diet; discussed iron rich foods and ways to incorporate these choices into her diet . Reviewed medications with patient and discussed continuing to take medications as prescribed and the importance. Explained that it is normal to have bruising at the injection site with lovenox. Advised to alternate injection sites. Discussed Dr. Assunta Curtis recommendation for taking probiotics. Marland Kitchen Collaborated with LCSW regarding patient having feelings of depression . Discussed plans with patient for ongoing care management follow up and provided patient with direct contact information for care management team . Encouraged [name] to get outside for 10-15 minutes everyday and take a walk . Discussed the ease of ordering online for supplements and household supplies . Reviewed upcoming appointments on 12/23 for CT, 08/29/20 with Dr. Dellis Filbert and 09/23/20 with GI to discuss scheduling colonoscopy  Patient Goals/Self-Care Activities Over the next 60 days, patient will:  -Self administers medications as prescribed Attends all scheduled provider appointments Calls  pharmacy for medication refills Performs ADL's independently Performs IADL's independently Calls provider office for new concerns or questions  Follow Up Plan: Telephone follow up appointment with Managed Medicaid care management team member scheduled for: 09/16/20 @ 1pm        Plan: RNCM will follow up with a telephone visit on 09/16/20 @ 1pm Follow-up:  Patient agrees to Care Plan and Follow-up.   Lurena Joiner RN, BSN [place]  [employer] manager

## 2020-08-01 DIAGNOSIS — Z982 Presence of cerebrospinal fluid drainage device: Secondary | ICD-10-CM | POA: Diagnosis not present

## 2020-08-01 DIAGNOSIS — Z9889 Other specified postprocedural states: Secondary | ICD-10-CM | POA: Diagnosis not present

## 2020-08-01 DIAGNOSIS — I671 Cerebral aneurysm, nonruptured: Secondary | ICD-10-CM | POA: Diagnosis not present

## 2020-08-05 ENCOUNTER — Other Ambulatory Visit: Payer: Self-pay

## 2020-08-05 NOTE — Patient Instructions (Signed)
Visit Information  [name]  - as a part of your Medicaid benefit, you are eligible for care management and care coordination services at no cost or copay. I was unable to reach you by phone today but would be happy to help you with your health related needs. Please feel free to call me @ [phone].   A member of the Managed Medicaid care management team will reach out to you again over the next 7 days.   Dior Dominik, BSW, MHA [facility]  [place]  High Risk Managed Medicaid Team    

## 2020-08-05 NOTE — Patient Outreach (Signed)
Care Coordination  08/05/2020  Sherry Anderson 31-Dec-1960 [id number]   Medicaid Managed Care   Unsuccessful Outreach Note  08/05/2020 Name: Sherry Anderson MRN: [id number] DOB: 03-Oct-1960  Referred by: Leeanne Rio, MD Reason for referral : High Risk Managed Medicaid (HR MM Unsuccessful Telephone Outreach)   A second unsuccessful telephone outreach was attempted today. The patient was referred to the case management team for assistance with care management and care coordination.   Follow Up Plan: BSW will follow up in 7 days.  Mickel Fuchs, BSW, [place]  High Risk Managed Medicaid Team

## 2020-08-07 ENCOUNTER — Ambulatory Visit (HOSPITAL_COMMUNITY): Payer: Medicaid Other

## 2020-08-14 ENCOUNTER — Ambulatory Visit (HOSPITAL_COMMUNITY): Payer: Medicaid Other

## 2020-08-14 ENCOUNTER — Other Ambulatory Visit: Payer: Self-pay

## 2020-08-14 NOTE — Patient Instructions (Signed)
Visit Information  Sherry Anderson was given information about Medicaid Managed Care team care coordination services as a part of their Healthy Blue Medicaid benefit. Sherry Anderson verbally consented to engagement with the [facility] Managed Care team.   For questions related to your Healthy [facility] health plan, please call: [phone] or visit the homepage here: [email or web address]  If you would like to schedule transportation through your Healthy [facility] plan, please call the following number at least 2 days in advance of your appointment: [phone]  Goals Addressed   None    Patient Care Plan: Cardiovascular Disease Management (DVT/PE)    Problem Identified: Recent DVT/PE     Long-Range Goal: Cardiovascular Management Plan Developed (DVT/PE)   Start Date: 07/01/2020  Expected End Date: 08/31/2020  Recent Progress: On track  Priority: High  Note:      Current Barriers:   Chronic Disease Management support and education needs related to recent blood clots and anemia  Nurse Case Manager Clinical Goal(s):   Over the next 30 days, patient will attend all scheduled medical appointments-Met  Over the next 30 days, patient will work with CM clinical social worker to discuss feelings of depression-Met  Over the next 60 days, the patient will demonstrate ongoing self health care management ability as evidenced by taking all medications as prescribed, attending scheduled appointments and notifying PCP with any new concerns/symptoms  Interventions:   Inter-disciplinary care team collaboration (see longitudinal plan of care)  Provided education to patient re: iron rich diet; discussed iron rich foods and ways to incorporate these choices into her diet  Reviewed medications with patient and discussed continuing to take medications as prescribed and the importance. Explained that it is normal to have bruising at the injection site  with lovenox. Advised to alternate injection sites. Discussed Dr. Assunta Curtis recommendation for taking probiotics.  Collaborated with LCSW regarding patient having feelings of depression  Discussed plans with patient for ongoing care management follow up and provided patient with direct contact information for care management team  Encouraged Sherry Anderson to get outside for 10-15 minutes everyday and take a walk  Discussed the ease of ordering online for supplements and household supplies  Reviewed upcoming appointments on 12/23 for CT, 08/29/20 with Dr. Dellis Filbert and 09/23/20 with GI to discuss scheduling colonoscopy  Patient Goals/Self-Care Activities Over the next 60 days, patient will:  -Self administers medications as prescribed Attends all scheduled provider appointments Calls pharmacy for medication refills Performs ADL's independently Performs IADL's independently Calls provider office for new concerns or questions  Follow Up Plan: Telephone follow up appointment with Managed Medicaid care management team member scheduled for: 09/16/20 @ 1pm     Patient Care Plan: Social Work Plan for Depression (Adult)    Problem Identified: Symptoms (Depression)     Long-Range Goal: Symptoms Monitored and Managed   Start Date: 07/22/2020  Expected End Date: 09/20/2020  Recent Progress: On track  Priority: High  Note:   Current Barriers:   Chronic Mental Health needs related to depression.  Social Isolation  Lacks knowledge of community resource: Patient was given a list by her PCP of community health providers but she does not know where to start with identifying the appropriate provider.   Suicidal Ideation/Homicidal Ideation: No  Clinical Social Work Goal(s):   Over the next 60 days, patient will work with SW monthly by telephone or in person to reduce or manage symptoms related to depression.  Over the next 60 days, patient will work with [employer]  to address concerns related to scheduling  appointments for therapy and medication management.  Interventions:  Patient interviewed and appropriate assessments performed: PHQ 2 and PHQ 9  Patient interviewed and appropriate assessments performed  Provided mental health counseling with regard to depression and the effects of trauma    Provided patient with information about the connection between mental health and physical health.  Collaborated with RN Case Manager re: patient's mental health needs.  Collaborated with BSW re: scheduling patient for therapy at [facility] [phone], [phone].  Emotional/Supportive Counseling  Referred to psychiatrist  Referred to counselor/psychotherapist  BSW contacted [facility] to schedule patient's therapy. They do offer walk in services throught out the week, patient stated she would prefer an appointment. BSW left a voicemail for a representative to call back to make an appointment.  Patient Self Care Activities:   Self administers medications as prescribed  Attends all scheduled provider appointments  Calls pharmacy for medication refills  Performs ADL's independently  Performs IADL's independently  Ability for insight  Independent living  Motivation for treatment  Patient Coping Strengths:   Spirituality  Hopefulness  Self Advocate  Able to Communicate Effectively  Patient Self Care Deficits:   Lacks social connections  Initial goal documentation    Long-Range Goal: Track and Manage My Symptoms-Depression   Start Date: 07/22/2020  Expected End Date: 09/20/2020  This Visit's Progress: On track  Priority: High  Note:   Timeframe:  Long-Range Goal Priority:  High Start Date:  07/22/2020                           Expected End Date:   02/05/202                    Follow Up Date 08/19/2020 @ 9:00am   - exercise at least 2 to 3 times per week - have a plan for how to handle bad days -  watch for early signs of feeling worse    Why is this important?    Keeping track of your progress will help your treatment team find the right mix of medicine and therapy for you.   Write in your journal every day.   Day-to-day changes in depression symptoms are normal. It may be more helpful to check your progress at the end of each week instead of every day.     Notes:       Education officer, museum will wait 5 business days to receive a telephone call back from [place] to get patient scheduled for an appointment.   [name]

## 2020-08-19 ENCOUNTER — Other Ambulatory Visit: Payer: Self-pay

## 2020-08-19 NOTE — Patient Instructions (Signed)
Visit Information  Ms. Doneen Poisson  - as a part of your Medicaid benefit, you are eligible for care management and care coordination services at no cost or copay. I was unable to reach you by phone today but would be happy to help you with your health related needs. Please feel free to call me @ [phone].   A member of the Managed Medicaid care management team will reach out to you again over the next 14 days.   Roselyn Bering, BSW, MSW, LCSW Social Work Case Production designer, theatre/television/film - [facility] Managed Care [facility]  [facility]  Direct Dial: [phone]

## 2020-08-19 NOTE — Patient Outreach (Signed)
Care Coordination  08/19/2020  [name] 1961-06-26 [id number]   Medicaid Managed Care   Unsuccessful Outreach Note  08/19/2020 Name: Sherry Anderson MRN: [id number] DOB: 12-22-1960  Referred by: Latrelle Dodrill, MD Reason for referral : High Risk Managed Medicaid (Unsuccessful phone attempt)   An unsuccessful telephone outreach was attempted today. The patient was referred to the case management team for assistance with care management and care coordination.   Follow Up Plan: A member from the Managed Medicaid Care Team will follow up in 14 days.  Roselyn Bering, BSW, MSW, LCSW Social Work Case Production designer, theatre/television/film - [facility] Managed Care [facility]  [facility]  Direct Dial: [phone]

## 2020-08-21 ENCOUNTER — Ambulatory Visit (HOSPITAL_COMMUNITY)
Admission: RE | Admit: 2020-08-21 | Discharge: 2020-08-21 | Disposition: A | Discharge status: Home / Self Care | Payer: Medicaid Other | Source: Ambulatory Visit | Attending: Physician Assistant | Admitting: Physician Assistant

## 2020-08-21 ENCOUNTER — Encounter (HOSPITAL_COMMUNITY): Payer: Self-pay

## 2020-08-21 ENCOUNTER — Other Ambulatory Visit: Payer: Self-pay

## 2020-08-21 DIAGNOSIS — Z9289 Personal history of other medical treatment: Secondary | ICD-10-CM | POA: Diagnosis not present

## 2020-08-21 DIAGNOSIS — K449 Diaphragmatic hernia without obstruction or gangrene: Secondary | ICD-10-CM | POA: Diagnosis not present

## 2020-08-21 DIAGNOSIS — R1013 Epigastric pain: Secondary | ICD-10-CM | POA: Diagnosis not present

## 2020-08-21 LAB — POCT I-STAT CREATININE: Creatinine, Ser: 1 mg/dL (ref 0.44–1.00)

## 2020-08-21 MED ORDER — IOHEXOL 300 MG/ML  SOLN
100.0000 mL | Freq: Once | INTRAMUSCULAR | Status: AC | PRN
  Administered 2020-08-21: 100 mL via INTRAVENOUS

## 2020-08-29 ENCOUNTER — Ambulatory Visit (INDEPENDENT_AMBULATORY_CARE_PROVIDER_SITE_OTHER): Payer: Medicaid Other | Admitting: Obstetrics & Gynecology

## 2020-08-29 ENCOUNTER — Encounter: Payer: Self-pay | Admitting: Obstetrics & Gynecology

## 2020-08-29 ENCOUNTER — Other Ambulatory Visit: Payer: Self-pay

## 2020-08-29 VITALS — BP 130/84 | Ht 62.25 in | Wt 164.0 lb

## 2020-08-29 DIAGNOSIS — Z01419 Encounter for gynecological examination (general) (routine) without abnormal findings: Secondary | ICD-10-CM

## 2020-08-29 DIAGNOSIS — D069 Carcinoma in situ of cervix, unspecified: Secondary | ICD-10-CM | POA: Diagnosis not present

## 2020-08-29 DIAGNOSIS — Z9079 Acquired absence of other genital organ(s): Secondary | ICD-10-CM

## 2020-08-29 DIAGNOSIS — Z90710 Acquired absence of both cervix and uterus: Secondary | ICD-10-CM | POA: Diagnosis not present

## 2020-08-29 DIAGNOSIS — Z13820 Encounter for screening for osteoporosis: Secondary | ICD-10-CM

## 2020-08-29 DIAGNOSIS — Z90722 Acquired absence of ovaries, bilateral: Secondary | ICD-10-CM

## 2020-08-29 DIAGNOSIS — Z1272 Encounter for screening for malignant neoplasm of vagina: Secondary | ICD-10-CM

## 2020-08-29 DIAGNOSIS — Z780 Asymptomatic menopausal state: Secondary | ICD-10-CM | POA: Diagnosis not present

## 2020-08-29 NOTE — Progress Notes (Signed)
Sherry Anderson 1960/11/12 [id number]   History:    60 y.o. G2P2L3  RP: Established patient presenting for annual gyn exam   HPI: S/P XI Robotic TLH/BSO in 09/2017 for AIS.  No pelvic pain anymore.  Urine/BMs normal.  Breasts normal.  [place]ono 2018.  Health labs with Fam MD.  Past medical history,surgical history, family history and social history were all reviewed and documented in the EPIC chart.  Gynecologic History No LMP recorded (lmp unknown). Patient has had a hysterectomy.  Obstetric History OB History  Gravida Para Term Preterm AB Living  2         3  SAB IAB Ectopic Multiple Live Births        1      # Outcome Date GA Lbr Len/2nd Weight Sex Delivery Anes PTL Lv  2 Gravida           1 Gravida              ROS: A ROS was performed and pertinent positives and negatives are included in the history.  GENERAL: No fevers or chills. HEENT: No change in vision, no earache, sore throat or sinus congestion. NECK: No pain or stiffness. CARDIOVASCULAR: No chest pain or pressure. No palpitations. PULMONARY: No shortness of breath, cough or wheeze. GASTROINTESTINAL: No abdominal pain, nausea, vomiting or diarrhea, melena or bright red blood per rectum. GENITOURINARY: No urinary frequency, urgency, hesitancy or dysuria. MUSCULOSKELETAL: No joint or muscle pain, no back pain, no recent trauma. DERMATOLOGIC: No rash, no itching, no lesions. ENDOCRINE: No polyuria, polydipsia, no heat or cold intolerance. No recent change in weight. HEMATOLOGICAL: No anemia or easy bruising or bleeding. NEUROLOGIC: No headache, seizures, numbness, tingling or weakness. PSYCHIATRIC: No depression, no loss of interest in normal activity or change in sleep pattern.     Exam:   BP 130/84   Ht 5' 2.25" (1.581 m)   Wt 164 lb (74.4 kg)   LMP  (LMP Unknown)   BMI 29.76 kg/m   Body mass index is 29.76 kg/m.  General appearance : Well developed well nourished female. No acute distress HEENT: Eyes: no  retinal hemorrhage or exudates,  Neck supple, trachea midline, no carotid bruits, no thyroidmegaly Lungs: Clear to auscultation, no rhonchi or wheezes, or rib retractions  Heart: Regular rate and rhythm, no murmurs or gallops Breast:Examined in sitting and supine position were symmetrical in appearance, no palpable masses or tenderness,  no skin retraction, no nipple inversion, no nipple discharge, no skin discoloration, no axillary or supraclavicular lymphadenopathy Abdomen: no palpable masses or tenderness, no rebound or guarding Extremities: no edema or skin discoloration or tenderness  Pelvic: Vulva: Normal             Vagina: No gross lesions or discharge.  Pap reflex done.  Cervix/Uterus absent  Adnexa  Without masses or tenderness  Anus: Normal   Assessment/Plan:  60 y.o. female for annual exam   1. Encounter for Papanicolaou smear of vagina as part of routine gynecological examination Gynecologic exam status post TLH BSO.  Pap test done on the vaginal vault.  Breast exam normal.  Screening mammogram March 2021 was negative.  Colonoscopy 2018.  Health labs with family physician.  Body mass index 29.76.  Recommend a slightly lower calorie/carb diet.  Aerobic activities 5 times a week and light weightlifting every 2 days.  2. S/P total hysterectomy and BSO (bilateral salpingo-oophorectomy)  3. Adenocarcinoma in situ (AIS) of uterine cervix Pap done on the  vaginal vault.  4. Postmenopause Well on no HRT.  5. Screening for osteoporosis Recommend vitamin D supplements, calcium intake a total of 1500 mg daily and regular weightbearing physical activities.  We will proceed with a bone density here now. - DG Bone Density; Future  Princess Bruins MD, 2:05 PM 08/29/2020

## 2020-09-01 LAB — PAP IG W/ RFLX HPV ASCU

## 2020-09-16 ENCOUNTER — Other Ambulatory Visit: Payer: Self-pay | Admitting: *Deleted

## 2020-09-16 ENCOUNTER — Other Ambulatory Visit: Payer: Self-pay

## 2020-09-16 DIAGNOSIS — Z5941 Food insecurity: Secondary | ICD-10-CM

## 2020-09-16 NOTE — Patient Outreach (Signed)
Medicaid Managed Care   Nurse Care Manager Note  09/16/2020 Name:  Sherry Anderson MRN:  [id number] DOB:  1960/09/26  Sherry Anderson is an 60 y.o. year old female who is Anderson primary patient of Sherry Anderson, Sherry Limber, MD.  The [facility] Managed Care Coordination team was consulted for assistance with:    clotting disorder  Sherry Anderson was given information about Medicaid Managed Care Coordination team services today. Sherry Anderson agreed to services and verbal consent obtained.  Engaged with patient by telephone for follow up visit in response to provider referral for case management and/or care coordination services.   Assessments/Interventions:  Review of past medical history, allergies, medications, health status, including review of consultants reports, laboratory and other test data, was performed as part of comprehensive evaluation and provision of chronic care management services.  SDOH (Social Determinants of Health) assessments and interventions performed:   Care Plan  No Known Allergies  Medications Reviewed Today    Reviewed by Sherry Montane, RN (Registered Nurse) on 09/16/20 at 17  Med List Status: <None>  Medication Order Taking? Sig Documenting Provider Last Dose Status Informant  acetaminophen (TYLENOL) 325 MG tablet [id number] Yes Take 650 mg by mouth every 6 (six) hours as needed. OTC PRN [provider] Taking Active   Calcium Citrate-Vitamin D (CALCIUM + D PO) [id number] Yes Take 1 tablet by mouth 2 (two) times daily. [provider] Taking Active Self           Med Note (Sherry Anderson   Thu Jul 31, 2020  1:15 PM) Taking once daily  Cyanocobalamin (B-12 PO) [id number] Yes Take by mouth. [provider] Taking Active            Med Note (Sherry Anderson   Tue Sep 16, 2020  1:14 PM)    enoxaparin (LOVENOX) 80 MG/0.8ML injection [id number] Yes Inject 0.7 mLs (70 mg total) into the skin every 12 (twelve) hours. Sherry Rio, MD  Taking Active   ferrous sulfate 325 (65 FE) MG tablet [id number] Yes Take 325 mg by mouth 2 (two) times daily with Anderson meal. [provider] Taking Active   levothyroxine (SYNTHROID) 75 MCG tablet [id number] Yes TAKE 1 TABLET (75 MCG TOTAL) BY MOUTH EVERY MORNING. Sherry Sherry Rio, MD Taking Active   lisinopril (ZESTRIL) 20 MG tablet [id number] Yes TAKE 1 TABLET BY MOUTH EVERY DAY Sherry Rio, MD Taking Active   omeprazole (PRILOSEC) 20 MG capsule [id number] Yes Take 1 capsule (20 mg total) by mouth 2 (two) times daily. Sherry Rio, MD Taking Active   triamterene-hydrochlorothiazide (DYAZIDE) 37.5-25 MG capsule [id number] Yes TAKE 1 CAPSULE BY MOUTH EVERY DAY Sherry Rio, MD Taking Active   venlafaxine XR (EFFEXOR-XR) 75 MG 24 hr capsule [id number] Yes TAKE 3 CAPSULES BY MOUTH EVERY DAY Sherry Anderson Sherry Limber, MD Taking Active           Patient Active Problem List   Diagnosis Date Noted  . Pulmonary embolism ([place]) 06/04/2020  . Dvt femoral (deep venous thrombosis) ([place]) 04/23/2020  . Syncope 09/24/2019  . Right trigeminal neuralgia 09/13/2019  . GERD (gastroesophageal reflux disease) 09/28/2018  . Dyspnea 09/28/2018  . Dental caries 06/21/2018  . Radiculopathy 04/13/2018  . Subclinical hypothyroidism 11/18/2017  . Atypical cervical glandular cells 04/21/2017  . Right-sided face pain 03/02/2017  . Tremor of right hand 11/07/2015  . Right-sided sensorineural hearing loss 11/13/2014  . DDD (degenerative disc disease), lumbar 10/21/2014  .  Knee pain, bilateral 11/06/2013  . Left wrist pain 03/02/2013  . Anemia 01/25/2012  . Right ear pain 01/19/2012  . HEADACHE 02/04/2010  . Hyperlipidemia 08/07/2009  . HEARING LOSS, RIGHT EAR 01/17/2008  . CARPAL TUNNEL SYNDROME, BILATERAL 07/31/2007  . LATERAL EPICONDYLITIS, BILATERAL 07/18/2007  . Fatigue due to depression 04/06/2007  . Gastritis and gastroduodenitis 11/21/2006  . Mood  disorder ([place]) 10/17/2006  . HYPERTENSION, BENIGN ESSENTIAL 10/17/2006  . CA IN SITU, BREAST 02/15/2001    Conditions to be addressed/monitored per PCP order:  clotting disorder  Care Plan : Cardiovascular Disease Management (DVT/PE)  Updates made by Sherry Montane, RN since 09/16/2020 12:00 AM    Problem: Recent DVT/PE     Long-Range Goal: Cardiovascular Management Plan Developed (DVT/PE)   Start Date: 07/01/2020  Expected End Date: 08/31/2020  Recent Progress: On track  Priority: High  Note:      Current Barriers:  . Chronic Disease Management support and education needs related to recent blood clots and anemia. Sherry Anderson continues to take lovenox injections and needs follow up to discuss length of treatment. She reports recent financial change and would like assistance for food resources. Patient expressed need for vision exam, feels that her vision is getting worse and she would like dental care to fix her teeth.  Nurse Case Manager Clinical Goal(s):  [name], patient will attend all scheduled medical appointments-Met . Over the next 30 days, patient will work with CM clinical social worker to discuss feelings of depression-Met . Over the next 60 days, the patient will demonstrate ongoing self health care management ability as evidenced by taking all medications as prescribed, attending scheduled appointments and notifying PCP with any new concerns/symptoms . Over the next 60 days, patient will schedule appointment with PCP and work with BSW for therapy/counseling options  Interventions:  . Inter-disciplinary care team collaboration (see longitudinal plan of care) . Provided education to patient re: iron rich diet; discussed iron rich foods and ways to incorporate these choices into her diet . Reviewed medications with patient and discussed continuing to take medications as prescribed and the importance. Explained that it is normal to have bruising at the  injection site with lovenox. Advised to alternate injection sites. Discussed Sherry Anderson recommendation for taking probiotics. Marland Kitchen Collaborated with LCSW regarding patient having feelings of depression-Patient missed follow up calls with LCSW and BSW. She is interested in establishing appointment for therapy. . Discussed plans with patient for ongoing care management follow up and provided patient with direct contact information for care management team . Encouraged Sherry Brownley to get outside for 10-15 minutes everyday and take Anderson walk . Discussed the ease of ordering online for supplements and household supplies . Reviewed upcoming appointments on 12/23 for CT, 08/29/20 with Dr. Dellis Filbert and 09/23/20 with GI to discuss scheduling colonoscopy . Encouraged patient to schedule Anderson follow up with her PCP to discuss lovenox and desired length of treatment . Provided Sherry Lubinski with information for vision and dental provider accepting Medicaid . Referral for Care Guide assistance for food insecurity  Patient Goals/Self-Care Activities Over the next 60 days, patient will: -Self administers medications as prescribed -Attends all scheduled provider appointments -Calls pharmacy for medication refills -Performs ADL's independently -Performs IADL's independently -Calls provider office for new concerns or question -schedule appointment with PCP  -arrange vision and dental exam  Follow Up Plan: Telephone follow up appointment with Managed Medicaid care management team member scheduled for: 11/17/20 @  1pm       Follow Up:  Patient agrees to Care Plan and Follow-up.  Plan: The Managed Medicaid care management team will reach out to the patient again over the next 60 days.  Date/time of next scheduled RN care management/care coordination outreach: 11/17/20 @ 1pm  Lurena Joiner RN, [place] RN Care Coordinator

## 2020-09-16 NOTE — Patient Instructions (Signed)
Visit Information  [name] was given information about Medicaid Managed Care team care coordination services as a part of their [employer] Medicaid benefit. [name] verbally consented to engagement with the [facility] Managed Care team.   For questions related to your Healthy [facility] health plan, please call: [phone] or visit the homepage here: [email or web address]  If you would like to schedule transportation through your Healthy [facility] plan, please call the following number at least 2 days in advance of your appointment: [phone]  Ms. [place] - following are the goals we discussed in your visit today:  Goals Addressed            This Visit's Progress   . Care Activities for DVT/PE       Over the next 60 days, patient will:  -Self administers medications as prescribed -Attends all scheduled provider appointments -Calls pharmacy for medication refills -Performs ADL's independently -Performs IADL's independently -Calls provider office for new concerns or question -schedule appointment with PCP  -arrange vision and dental exam              Please see education materials related to back pain provided by MyChart link.  Patient verbalizes understanding of instructions provided today.   Telephone follow up appointment with Managed Medicaid care management team member scheduled for:11/17/20 @ 1p  Melissa Montane, RN  Following is a copy of your plan of care:  Patient Care Plan: Cardiovascular Disease Management (DVT/PE)    Problem Identified: Recent DVT/PE     Long-Range Goal: Cardiovascular Management Plan Developed (DVT/PE)   Start Date: 07/01/2020  Expected End Date: 08/31/2020  Recent Progress: On track  Priority: High  Note:      Current Barriers:  . Chronic Disease Management support and education needs related to recent blood clots and anemia. Ms Souter continues to take lovenox  injections and needs follow up to discuss length of treatment. She reports recent financial change and would like assistance for food resources. Patient expressed need for vision exam, feels that her vision is getting worse and she would like dental care to fix her teeth.  Nurse Case Manager Clinical Goal(s):  [name], patient will attend all scheduled medical appointments-Met . Over the next 30 days, patient will work with CM clinical social worker to discuss feelings of depression-Met . Over the next 60 days, the patient will demonstrate ongoing self health care management ability as evidenced by taking all medications as prescribed, attending scheduled appointments and notifying PCP with any new concerns/symptoms . Over the next 60 days, patient will schedule appointment with PCP and work with BSW for therapy/counseling options  Interventions:  . Inter-disciplinary care team collaboration (see longitudinal plan of care) . Provided education to patient re: iron rich diet; discussed iron rich foods and ways to incorporate these choices into her diet . Reviewed medications with patient and discussed continuing to take medications as prescribed and the importance. Explained that it is normal to have bruising at the injection site with lovenox. Advised to alternate injection sites. Discussed Dr. Assunta Curtis recommendation for taking probiotics. Marland Kitchen Collaborated with LCSW regarding patient having feelings of depression-Patient missed follow up calls with LCSW and BSW. She is interested in establishing appointment for therapy. . Discussed plans with patient for ongoing care management follow up and provided patient with direct contact information for care management team . Encouraged Ms Rizo to get outside for 10-15 minutes everyday and take a walk . Discussed the ease  of ordering online for supplements and household supplies . Reviewed upcoming appointments on 12/23 for CT, 08/29/20 with  Dr. Dellis Filbert and 09/23/20 with GI to discuss scheduling colonoscopy . Encouraged patient to schedule a follow up with her PCP to discuss lovenox and desired length of treatment . Provided Ms Frosch with information for vision and dental provider accepting Medicaid . Referral for Care Guide assistance for food insecurity  Patient Goals/Self-Care Activities Over the next 60 days, patient will: -Self administers medications as prescribed -Attends all scheduled provider appointments -Calls pharmacy for medication refills -Performs ADL's independently -Performs IADL's independently -Calls provider office for new concerns or question -schedule appointment with PCP  -arrange vision and dental exam  Follow Up Plan: Telephone follow up appointment with Managed Medicaid care management team member scheduled for: 11/17/20 @ 1pm     Patient Care Plan: Social Work Plan for Depression (Adult)    Problem Identified: Symptoms (Depression)     Long-Range Goal: Symptoms Monitored and Managed   Start Date: 07/22/2020  Expected End Date: 09/20/2020  Recent Progress: On track  Priority: High  Note:   Current Barriers:  . Chronic Mental Health needs related to depression. . Social Isolation . Lacks knowledge of community resource: Patient was given a list by her PCP of community health providers but she does not know where to start with identifying the appropriate provider.  . Suicidal Ideation/Homicidal Ideation: No  Clinical Social Work Goal(s):  Marland Kitchen Over the next 60 days, patient will work with SW monthly by telephone or in person to reduce or manage symptoms related to depression. . Over the next 60 days, patient will work with BSW to address concerns related to scheduling appointments for therapy and medication management.  Interventions: . Patient interviewed and appropriate assessments performed: PHQ 2 and PHQ 9 . Patient interviewed and appropriate assessments performed . Provided mental health  counseling with regard to depression and the effects of trauma   . Provided patient with information about the connection between mental health and physical health. [name] with RN Case Manager re: patient's mental health needs. [name] with BSW re: scheduling patient for therapy at [facility] the [facility] [phone], [phone]. Marland Kitchen Emotional/Supportive Counseling . Referred to psychiatrist . Referred to counselor/psychotherapist . BSW contacted [facility] to schedule patient's therapy. They do offer walk in services throught out the week, patient stated she would prefer an appointment. BSW left a voicemail for a representative to call back to make an appointment.  Patient Self Care Activities:  . Self administers medications as prescribed . Attends all scheduled provider appointments . Calls pharmacy for medication refills . Performs ADL's independently . Performs IADL's independently . Ability for insight . Independent living . Motivation for treatment  Patient Coping Strengths:  . Spirituality . Hopefulness . Self Advocate . Able to Communicate Effectively  Patient Self Care Deficits:  . Lacks social connections  Initial goal documentation    Long-Range Goal: Track and Manage My Symptoms-Depression   Start Date: 07/22/2020  Expected End Date: 09/20/2020  This Visit's Progress: On track  Priority: High  Note:   Timeframe:  Long-Range Goal Priority:  High Start Date:  07/22/2020                           Expected End Date:   02/05/202  Follow Up Date 08/19/2020 @ 9:00am   - exercise at least 2 to 3 times per week - have a plan for how to handle bad days - watch for early signs of feeling worse    Why is this important?    Keeping track of your progress will help your treatment team find the right mix of medicine and therapy for you.   Write in your journal every day.   Day-to-day  changes in depression symptoms are normal. It may be more helpful to check your progress at the end of each week instead of every day.     Notes:

## 2020-09-16 NOTE — Addendum Note (Signed)
Addended by: Lurena Joiner A on: 09/16/2020 02:12 PM   Modules accepted: Orders

## 2020-09-17 ENCOUNTER — Other Ambulatory Visit: Payer: Self-pay

## 2020-09-17 NOTE — Patient Outreach (Cosign Needed Addendum)
Medicaid Managed Care Social Work Note  09/17/2020 Name:  Massiel Stipp MRN:  [id number] DOB:  [birth year]  Morna Flud is an 60 y.o. year old female who is a primary patient of Ardelia Mems, Delorse Limber, MD.  The Medicaid Managed Care Coordination team was consulted for assistance with:  [place] and Resources  Ms. Bond was given information about Medicaid Managed CareCoordination services today. [name] agreed to services and verbal consent obtained.  Engaged with patient  for by telephone forfollow up visit in response to referral for case management and/or care coordination services.   Assessments/Interventions:  Review of past medical history, allergies, medications, health status, including review of consultants reports, laboratory and other test data, was performed as part of comprehensive evaluation and provision of chronic care management services.  SDOH: (Social Determinant of Health) assessments and interventions performed:  BSW scheduled patient for therapy and medication management at [facility]. Therapy is 09/29/20 at 1pm and medication management 10/31/20 at 9:20am, both appointments are virtual.    Advanced Directives Status:  Not addressed in this encounter.  Care Plan                 No Known Allergies  Medications Reviewed Today    Reviewed by Melissa Montane, RN (Registered Nurse) on 09/16/20 at 17  Med List Status: <None>  Medication Order Taking? Sig Documenting Provider Last Dose Status Informant  acetaminophen (TYLENOL) 325 MG tablet [id number] Yes Take 650 mg by mouth every 6 (six) hours as needed. OTC PRN [provider] Taking Active   Calcium Citrate-Vitamin D (CALCIUM + D PO) [id number] Yes Take 1 tablet by mouth 2 (two) times daily. [provider] Taking Active Self           Med Note (ROBB, MELANIE A   Thu Jul 31, 2020  1:15 PM) Taking once daily  Cyanocobalamin (B-12 PO) [id number] Yes Take by mouth.  [provider] Taking Active            Med Note (ROBB, MELANIE A   Tue Sep 16, 2020  1:14 PM)    enoxaparin (LOVENOX) 80 MG/0.8ML injection [id number] Yes Inject 0.7 mLs (70 mg total) into the skin every 12 (twelve) hours. Leeanne Rio, MD Taking Active   ferrous sulfate 325 (65 FE) MG tablet [id number] Yes Take 325 mg by mouth 2 (two) times daily with a meal. [provider] Taking Active   levothyroxine (SYNTHROID) 75 MCG tablet [id number] Yes TAKE 1 TABLET (75 MCG TOTAL) BY MOUTH EVERY MORNING. [place] Leeanne Rio, MD Taking Active   lisinopril (ZESTRIL) 20 MG tablet [id number] Yes TAKE 1 TABLET BY MOUTH EVERY DAY Leeanne Rio, MD Taking Active   omeprazole (PRILOSEC) 20 MG capsule [id number] Yes Take 1 capsule (20 mg total) by mouth 2 (two) times daily. Leeanne Rio, MD Taking Active   triamterene-hydrochlorothiazide (DYAZIDE) 37.5-25 MG capsule [id number] Yes TAKE 1 CAPSULE BY MOUTH EVERY DAY Leeanne Rio, MD Taking Active   venlafaxine XR (EFFEXOR-XR) 75 MG 24 hr capsule [id number] Yes TAKE 3 CAPSULES BY MOUTH EVERY DAY Ardelia Mems Delorse Limber, MD Taking Active           Patient Active Problem List   Diagnosis Date Noted  . Pulmonary embolism ([place]) 06/04/2020  . Dvt femoral (deep venous thrombosis) ([place]) 04/23/2020  . Syncope 09/24/2019  . Right trigeminal neuralgia 09/13/2019  . GERD (gastroesophageal reflux disease) 09/28/2018  . Dyspnea 09/28/2018  . Dental  caries 06/21/2018  . Radiculopathy 04/13/2018  . Subclinical hypothyroidism 11/18/2017  . Atypical cervical glandular cells 04/21/2017  . Right-sided face pain 03/02/2017  . Tremor of right hand 11/07/2015  . Right-sided sensorineural hearing loss 11/13/2014  . DDD (degenerative disc disease), lumbar 10/21/2014  . Knee pain, bilateral 11/06/2013  . Left wrist pain 03/02/2013  . Anemia 01/25/2012  . Right ear pain 01/19/2012  . HEADACHE 02/04/2010  .  Hyperlipidemia 08/07/2009  . HEARING LOSS, RIGHT EAR 01/17/2008  . CARPAL TUNNEL SYNDROME, BILATERAL 07/31/2007  . LATERAL EPICONDYLITIS, BILATERAL 07/18/2007  . Fatigue due to depression 04/06/2007  . Gastritis and gastroduodenitis 11/21/2006  . Mood disorder ([place]) 10/17/2006  . HYPERTENSION, BENIGN ESSENTIAL 10/17/2006  . CA IN SITU, BREAST 02/15/2001    Conditions to be addressed/monitored per PCP order:  Depression  Care Plan : Social Work Plan for Depression (Adult)  Updates made by [name] since 09/17/2020 12:00 AM    Problem: Symptoms (Depression)     Long-Range Goal: Symptoms Monitored and Managed   Start Date: 07/22/2020  Expected End Date: 09/20/2020  Recent Progress: On track  Priority: High  Note:   Current Barriers:  . Chronic Mental Health needs related to depression. . Social Isolation . Lacks knowledge of community resource: Patient was given a list by her PCP of community health providers but she does not know where to start with identifying the appropriate provider.  . Suicidal Ideation/Homicidal Ideation: No  Clinical Social Work Goal(s):  Marland Kitchen Over the next 60 days, patient will work with SW monthly by telephone or in person to reduce or manage symptoms related to depression. . Over the next 60 days, patient will work with BSW to address concerns related to scheduling appointments for therapy and medication management.  Interventions: . Patient interviewed and appropriate assessments performed: PHQ 2 and PHQ 9 . Patient interviewed and appropriate assessments performed . Provided mental health counseling with regard to depression and the effects of trauma   . Provided patient with information about the connection between mental health and physical health. [name] with RN Case Manager re: patient's mental health needs. [name] with BSW re: scheduling patient for therapy at [facility] the [facility] [phone],  [address]. Marland Kitchen Emotional/Supportive Counseling . Referred to psychiatrist . Referred to counselor/psychotherapist . BSW contacted [facility] to schedule patient's therapy. They do offer walk in services throught out the week, patient stated she would prefer an appointment. BSW left a voicemail for a representative to call back to make an appointment. Marland Kitchen Update 09/17/20: Patient  stated she was still interested in therapy and medication management. BSW scheduled patient for therapy at [facility] on 09/29/20 at 1pm and medication management on 10/31/20 at 9:20am. Both appointments will be virtual.   Patient Self Care Activities:  . Self administers medications as prescribed . Attends all scheduled provider appointments . Calls pharmacy for medication refills . Performs ADL's independently . Performs IADL's independently . Ability for insight . Independent living . Motivation for treatment  Patient Coping Strengths:  . Spirituality . Hopefulness . Self Advocate . Able to Communicate Effectively  Patient Self Care Deficits:  . Lacks social connections  Initial goal documentation      Follow up:  Patient agrees to Care Plan and Follow-up.  Plan: The Managed Medicaid care management team will reach out to the patient again over the next 30 days.  Date/time of next scheduled Social Work care management/care  coordination outreach:  10/15/20  Mickel Fuchs, [place], [place]  High Risk Managed Medicaid Team

## 2020-09-17 NOTE — Patient Instructions (Signed)
Visit Information  Sherry Anderson was given information about Medicaid Managed Care team care coordination services as a part of their [employer] Medicaid benefit. Sherry Anderson verbally consented to engagement with the [facility] Managed Care team.  As a reminder you appointment for therapy at the [facility] will be on 09/29/20 at 1pm. This will be a virtual appointment. They will contact you the day before for a reminder. If you need to reschedule or cancel your appointment they can be reached at [phone].   For questions related to your Healthy [facility] health plan, please call: [phone] or visit the homepage here: [email or web address]  If you would like to schedule transportation through your Healthy [facility] plan, please call the following number at least 2 days in advance of your appointment: [phone]  Sherry Anderson - following are the goals we discussed in your visit today:  Goals Addressed   None      Social Worker will follow up in 30 days.   Sherry Anderson  Following is a copy of your plan of care:  Patient Care Plan: Cardiovascular Disease Management (DVT/PE)    Problem Identified: Recent DVT/PE     Long-Range Goal: Cardiovascular Management Plan Developed (DVT/PE)   Start Date: 07/01/2020  Expected End Date: 08/31/2020  Recent Progress: On track  Priority: High  Note:      Current Barriers:  . Chronic Disease Management support and education needs related to recent blood clots and anemia. Sherry Anderson continues to take lovenox injections and needs follow up to discuss length of treatment. She reports recent financial change and would like assistance for food resources. Patient expressed need for vision exam, feels that her vision is getting worse and she would like dental care to fix her teeth.  Nurse Case Manager Clinical Goal(s):  Sherry Anderson Kitchen Over the next 30 days, [name] . Over the next 30 days, patient will work with CM clinical social worker to discuss feelings of depression-Met . Over the next 60 days, the patient will demonstrate ongoing self health care management ability as evidenced by taking all medications as prescribed, attending scheduled appointments and notifying PCP with any new concerns/symptoms . Over the next 60 days, patient will schedule appointment with PCP and work with BSW for therapy/counseling options  Interventions:  . Inter-disciplinary care team collaboration (see longitudinal plan of care) . Provided education to patient re: iron rich diet; discussed iron rich foods and ways to incorporate these choices into her diet . Reviewed medications with patient and discussed continuing to take medications as prescribed and the importance. Explained that it is normal to have bruising at the injection site with lovenox. Advised to alternate injection sites. Discussed Dr. Assunta Curtis recommendation for taking probiotics. Sherry Anderson Kitchen Collaborated with LCSW regarding patient having feelings of depression-Patient missed follow up calls with LCSW and BSW. She is interested in establishing appointment for therapy. . Discussed plans with patient for ongoing care management follow up and provided patient with direct contact information for care management team . Encouraged Sherry Anderson to get outside for 10-15 minutes everyday and take a walk . Discussed the ease of ordering online for supplements and household supplies . Reviewed upcoming appointments on 12/23 for CT, 08/29/20 with Dr. Dellis Filbert and 09/23/20 with GI to discuss scheduling colonoscopy . Encouraged patient to schedule a follow up with her PCP to discuss lovenox and desired length of treatment . Provided Sherry Anderson with information for vision and dental  provider accepting Medicaid . Referral for Care Guide assistance for food insecurity  Patient Goals/Self-Care  Activities Over the next 60 days, patient will: -Self administers medications as prescribed -Attends all scheduled provider appointments -Calls pharmacy for medication refills -Performs ADL's independently -Performs IADL's independently -Calls provider office for new concerns or question -schedule appointment with PCP  -arrange vision and dental exam  Follow Up Plan: Telephone follow up appointment with Managed Medicaid care management team member scheduled for: 11/17/20 @ 1pm     Patient Care Plan: Social Work Plan for Depression (Adult)    Problem Identified: Symptoms (Depression)     Long-Range Goal: Symptoms Monitored and Managed   Start Date: 07/22/2020  Expected End Date: 09/20/2020  Recent Progress: On track  Priority: High  Note:   Current Barriers:  . Chronic Mental Health needs related to depression. . Social Isolation . Lacks knowledge of community resource: Patient was given a list by her PCP of community health providers but she does not know where to start with identifying the appropriate provider.  . Suicidal Ideation/Homicidal Ideation: No  Clinical Social Work Goal(s):  Sherry Anderson Kitchen Over the next 60 days, patient will work with SW monthly by telephone or in person to reduce or manage symptoms related to depression. . Over the next 60 days, patient will work with BSW to address concerns related to scheduling appointments for therapy and medication management.  Interventions: . Patient interviewed and appropriate assessments performed: PHQ 2 and PHQ 9 . Patient interviewed and appropriate assessments performed . Provided mental health counseling with regard to depression and the effects of trauma   . Provided patient with information about the connection between mental health and physical health. Sherry Anderson with RN Case Manager re: patient's mental health needs. Sherry Anderson with BSW re: scheduling patient for therapy at [facility] the [facility] [phone], [phone]. Sherry Anderson Kitchen Emotional/Supportive Counseling . Referred to psychiatrist . Referred to counselor/psychotherapist . BSW contacted [facility] to schedule patient's therapy. They do offer walk in services throught out the week, patient stated she would prefer an appointment. BSW left a voicemail for a representative to call back to make an appointment. Sherry Anderson Kitchen Update 09/17/20: Patient  stated she was still interested in therapy and medication management. BSW scheduled patient for therapy at [facility] on 09/29/20 at 1pm and medication management on 10/31/20 at 9:20am. Both appointments will be virtual.   Patient Self Care Activities:  . Self administers medications as prescribed . Attends all scheduled provider appointments . Calls pharmacy for medication refills . Performs ADL's independently . Performs IADL's independently . Ability for insight . Independent living . Motivation for treatment  Patient Coping Strengths:  . Spirituality . Hopefulness . Self Advocate . Able to Communicate Effectively  Patient Self Care Deficits:  . Lacks social connections  Initial goal documentation    Long-Range Goal: Track and Manage My Symptoms-Depression   Start Date: 07/22/2020  Expected End Date: 09/20/2020  This Visit's Progress: On track  Priority: High  Note:   Timeframe:  Long-Range Goal Priority:  High Start Date:  07/22/2020                           Expected End Date:   02/05/202                    Follow Up Date 08/19/2020 @ 9:00am   - exercise at least 2 to  3 times per week - have a plan for how to handle bad days - watch for early signs of feeling worse    Why is this important?    Keeping track of your progress will help your treatment team find the right mix of medicine and therapy for you.   Write in your journal every day.   Day-to-day changes in depression symptoms are normal. It may be more helpful to check your progress at the end of each  week instead of every day.     Notes:

## 2020-09-19 ENCOUNTER — Telehealth: Payer: Self-pay | Admitting: Family Medicine

## 2020-09-19 NOTE — Telephone Encounter (Signed)
   Telephone encounter was:  Unsuccessful.  09/19/2020 Name: Sherry Anderson MRN: [id number] DOB: 12-Mar-1961  Unsuccessful outbound call made today to assist with:  Food Insecurity  Outreach Attempt:  1st Attempt  A HIPAA compliant voice message was left requesting a return call.  Instructed patient to call back at [phone].  [place], Care Management Phone: [phone] Email: [email or web address]

## 2020-09-22 ENCOUNTER — Encounter: Payer: Self-pay | Admitting: *Deleted

## 2020-09-23 ENCOUNTER — Ambulatory Visit (INDEPENDENT_AMBULATORY_CARE_PROVIDER_SITE_OTHER): Payer: Medicare HMO | Admitting: Gastroenterology

## 2020-09-23 ENCOUNTER — Encounter: Payer: Self-pay | Admitting: Gastroenterology

## 2020-09-23 ENCOUNTER — Ambulatory Visit: Payer: Medicare HMO | Admitting: Family Medicine

## 2020-09-23 VITALS — BP 110/60 | HR 95 | Ht 62.0 in | Wt 169.0 lb

## 2020-09-23 DIAGNOSIS — Z86010 Personal history of colonic polyps: Secondary | ICD-10-CM | POA: Diagnosis not present

## 2020-09-23 NOTE — Progress Notes (Signed)
Sherry Anderson    [id number]    1960-12-30  Primary Care Physician:McIntyre, Delorse Limber, MD  Referring Physician: Leeanne Rio, MD [address] [place],  [place] [address]   Chief complaint: History of colon polyps  HPI:  60 year old very pleasant female with history of colon polyps here to discuss surveillance colonoscopy. She is currently on Lovenox after recent diagnosis of pulmonary embolism.  According to patient she was recommended 3 months of uninterrupted anticoagulation and she will be completing it this week.  She has follow-up CT scan and office visit later this week to discuss if she needs to extend the anticoagulation.  He has history of trigeminal neuralgia, underwent right microvascular decompression procedure in June 4128 complicated by pseudomeningocele and right Bell's palsy.  She had to undergo VP shunt placement, subsequently developed DVT, was treated with Eliquis.  She developed PE in October and was started on Lovenox.  Overall she is feeling well, denies any complaints other than feeling tired and sleepy, she feels she is getting more tired in the past 1 to 2 weeks.  No fever, chills, myalgia or joint pains. Denies any nausea, vomiting, abdominal pain, melena or bright red blood per rectum    Outpatient Encounter Medications as of 09/23/2020  Medication Sig  . acetaminophen (TYLENOL) 325 MG tablet Take 650 mg by mouth every 6 (six) hours as needed. OTC PRN  . Calcium Citrate-Vitamin D (CALCIUM + D PO) Take 1 tablet by mouth 2 (two) times daily.  . Cyanocobalamin (B-12 PO) Take by mouth.  . enoxaparin (LOVENOX) 80 MG/0.8ML injection Inject 0.7 mLs (70 mg total) into the skin every 12 (twelve) hours.  . ferrous sulfate 325 (65 FE) MG tablet Take 325 mg by mouth 2 (two) times daily with a meal.  . levothyroxine (SYNTHROID) 75 MCG tablet TAKE 1 TABLET (75 MCG TOTAL) BY MOUTH EVERY MORNING. [place]  . lisinopril  (ZESTRIL) 20 MG tablet TAKE 1 TABLET BY MOUTH EVERY DAY  . omeprazole (PRILOSEC) 20 MG capsule Take 1 capsule (20 mg total) by mouth 2 (two) times daily.  Mar[place] triamterene-hydrochlorothiazide (DYAZIDE) 37.5-25 MG capsule TAKE 1 CAPSULE BY MOUTH EVERY DAY  . venlafaxine XR (EFFEXOR-XR) 75 MG 24 hr capsule TAKE 3 CAPSULES BY MOUTH EVERY DAY   No facility-administered encounter medications on file as of 09/23/2020.    Allergies as of 09/23/2020  . (No Known Allergies)    Past Medical History:  Diagnosis Date  . Adenocarcinoma in situ (AIS) of uterine cervix 05/2017   Associated with high-grade dysplasia  . Anxiety and depression   . Breast cancer ([place]) 2002   left breast cancer   . Depression   . Diverticulosis of colon   . DVT (deep venous thrombosis) ([place]) 2021   provoked by surgery  . Essential hypertension, benign   . GERD (gastroesophageal reflux disease)   . Hepatitis    unsure of which type  . Hiatal hernia   . High grade squamous intraepithelial cervical dysplasia 05/2017   Associated with adenocarcinoma in situ  . History of adenomatous polyp of colon    tubular adenoma's  02-08-2017  . History of gastritis   . History of left breast cancer 2002---- per pt no recurrence   dx DCIS --- s/p  left breast lumpectomy;  per pt radiation therapy completed same year and completed antiestrogen therapy   . History of radiation therapy    2002  left breast  . Hypothyroidism   . Internal hemorrhoids   . Pain    right ear and jaw  . Spinal stenosis   . Spondylolisthesis   . SVD (spontaneous vaginal delivery)    x 1  . Tubular adenoma of colon     Past Surgical History:  Procedure Laterality Date  . BREAST LUMPECTOMY Left 2002   ductal CA in situ  . CERVICAL CONIZATION W/BX N/A 06/08/2017   Procedure: CONIZATION CERVIX WITH BIOPSY;  Surgeon: Anastasio Auerbach, MD;  Location: [facility];  Service: Gynecology;  Laterality: N/A;  . [place]    twins  . COLONOSCOPY  02-08-2017   dr Karleen Hampshire  . DILATATION & CURETTAGE/HYSTEROSCOPY WITH MYOSURE N/A 06/08/2017   Procedure: [place];  Surgeon: Anastasio Auerbach, MD;  Location: [facility];  Service: Gynecology;  Laterality: N/A;  . ESOPHAGOGASTRODUODENOSCOPY  last one 11-24-2016   dr Karleen Hampshire  . ROBOTIC ASSISTED TOTAL HYSTERECTOMY WITH BILATERAL SALPINGO OOPHERECTOMY N/A 09/21/2017   Procedure: XI ROBOTIC ASSISTED TOTAL HYSTERECTOMY WITH BILATERAL SALPINGO OOPHORECTOMY, Lysis of Adhesions;  Surgeon: Princess Bruins, MD;  Location: [facility];  Service: Gynecology;  Laterality: N/A;  . TOOTH EXTRACTION    . TRANSFORAMINAL LUMBAR INTERBODY FUSION (TLIF) WITH PEDICLE SCREW FIXATION 2 LEVEL N/A 04/13/2018   Procedure: RIGHT-SIDED LUMBAR 4-5 AND LUMBAR 5-SACRUM 1 TRANSFORAMINAL LUMBAR INTERBODY FUSION WITH INSTRUMENTATION AND ALLOGRAFT;  Surgeon: Phylliss Bob, MD;  Location: [facility];  Service: Orthopedics;  Laterality: N/A;  . WISDOM TOOTH EXTRACTION      Family History  Problem Relation Age of Onset  . Diabetes Mother   . Hypertension Mother   . Heart disease Father        Pacemaker  . Thyroid disease Sister   . Schizophrenia Brother   . Colon cancer Neg Hx   . Esophageal cancer Neg Hx   . Rectal cancer Neg Hx   . Stomach cancer Neg Hx   . Breast cancer Neg Hx   . Pancreatic cancer Neg Hx     Social History   Socioeconomic History  . Marital status: Legally Separated    Spouse name: Not on file  . Number of children: 3  . Years of education: 6  . Highest education level: 6th grade  Occupational History  . Occupation: Disability  Tobacco Use  . Smoking status: Never Smoker  . Smokeless tobacco: Never Used  Vaping Use  . Vaping Use: Never used  Substance and Sexual Activity  . Alcohol use: No  . Drug use: No  . Sexual activity: Not Currently    Birth control/protection: Post-menopausal    Comment: 1st intercourse 60  yo-Fewer than 5 partners  Other Topics Concern  . Not on file  Social History Narrative   Moved from [place] to [place], to [place] at home with her nephew and her son.   Right-handed.   Occasional use of caffeine.   Social Determinants of Health   Financial Resource Strain: Low Risk   . Difficulty of Paying Living Expenses: Not very hard  Food Insecurity: No Food Insecurity  . Worried About Charity fundraiser in the Last Year: Never true  . Ran Out of Food in the Last Year: Never true  Transportation Needs: No Transportation Needs  . Lack of Transportation (Medical): No  . Lack of Transportation (Non-Medical): No  Physical Activity: Insufficiently Active  . Days of Exercise per  Week: 3 days  . Minutes of Exercise per Session: 30 min  Stress: No Stress Concern Present  . Feeling of Stress : Only a little  Social Connections: Moderately Isolated  . Frequency of Communication with Friends and Family: More than three times a week  . Frequency of Social Gatherings with Friends and Family: Once a week  . Attends Religious Services: More than 4 times per year  . Active Member of Clubs or Organizations: No  . Attends Archivist Meetings: Never  . Marital Status: Separated  Intimate Partner Violence: Not At Risk  . Fear of Current or Ex-Partner: No  . Emotionally Abused: No  . Physically Abused: No  . Sexually Abused: No      Review of systems: All other review of systems negative except as mentioned in the HPI.   Physical Exam: Vitals:   09/23/20 1112  BP: 110/60  Pulse: 95   Body mass index is 30.91 kg/m. Gen:      No acute distress Neuro: alert and oriented x 3 Psych: normal mood and affect  Data Reviewed:  Reviewed labs, radiology imaging, old records and pertinent past GI work up   Assessment and Plan/Recommendations:  60 year old very pleasant female with history of colon polyps, tubular adenomas X4 removed in 2018 Is due for  surveillance colonoscopy  History of pseudomeningocele, right Bell's palsy s/p VP shunt placement in June 2021, developed DVT in July 2021 was treated with Eliquis and subsequently developed PE in October 2021 and is currently on Lovenox She completed 3 months of Lovenox  Prior to scheduling colonoscopy, will request clearance from Dr. Ardelia Mems and prescribing MD if she can hold anticoagulation at this point or if we need to delay the procedure for additional 3 months. She has follow-up CT scan and office visit to discuss long-term anticoagulation  Advised patient to call back to schedule colonoscopy based on findings of CT and plan for anticoagulation, informed her that it is okay to delay the procedure given it is a surveillance colonoscopy for history of colon polyps and she has no specific GI symptoms  GERD: Continue PPI and antireflux measures  Return as needed  This visit required 30 minutes of patient care (this includes precharting, chart review, review of results, face-to-face time used for counseling as well as treatment plan and follow-up. The patient was provided an opportunity to ask questions and all were answered. The patient agreed with the plan and demonstrated an understanding of the instructions.  Damaris Hippo , MD    CC: Leeanne Rio, MD

## 2020-09-23 NOTE — Patient Instructions (Signed)
Call us back after your upcoming appointment if you have been cleared to have a colonoscopy.

## 2020-09-25 ENCOUNTER — Encounter: Payer: Self-pay | Admitting: Family Medicine

## 2020-09-25 ENCOUNTER — Other Ambulatory Visit: Payer: Self-pay

## 2020-09-25 ENCOUNTER — Telehealth: Payer: Self-pay | Admitting: Family Medicine

## 2020-09-25 ENCOUNTER — Ambulatory Visit (INDEPENDENT_AMBULATORY_CARE_PROVIDER_SITE_OTHER): Payer: Medicare HMO | Admitting: Family Medicine

## 2020-09-25 VITALS — BP 116/62 | HR 100 | Ht 62.0 in | Wt 167.0 lb

## 2020-09-25 DIAGNOSIS — R5383 Other fatigue: Secondary | ICD-10-CM

## 2020-09-25 DIAGNOSIS — D649 Anemia, unspecified: Secondary | ICD-10-CM | POA: Diagnosis not present

## 2020-09-25 DIAGNOSIS — Z7901 Long term (current) use of anticoagulants: Secondary | ICD-10-CM | POA: Diagnosis not present

## 2020-09-25 DIAGNOSIS — I2699 Other pulmonary embolism without acute cor pulmonale: Secondary | ICD-10-CM

## 2020-09-25 MED ORDER — ENOXAPARIN SODIUM 80 MG/0.8ML ~~LOC~~ SOLN: 70.0000 mg | Freq: Two times a day (BID) | SUBCUTANEOUS | 3 refills | Status: DC

## 2020-09-25 NOTE — Progress Notes (Addendum)
  Date of Visit: 09/25/2020   SUBJECTIVE:   HPI:  Sherry Anderson presents today for follow up.  Anticoagulation - she finished the 3 month course of anticoagulation two days ago. Is waiting on today's appointment to discuss continuing anticoagulation before she can schedule her screening colonoscopy. Denies any bleeding. History of anemia, taking iron 325mg  twice daily.  Fatigue - has noticed she feels more sleepy during the day, just tired in general. Does report that she snores and when she was hospitalized at [facility] they mentioned to her that she needs a sleep study.  OBJECTIVE:   BP 116/62   Pulse 100   Ht 5\' 2"  (1.575 m)   Wt 167 lb (75.8 kg)   LMP  (LMP Unknown)   SpO2 97%   BMI 30.54 kg/m  Gen: no acute distress, pleasant, cooperative HEENT: normocephalic, atraumatic  Heart: regular rate and rhythm, no murmur Lungs: clear to auscultation bilaterally normal work of breathing  Neuro: alert, speech normal, grossly nonfocal Ext: No appreciable lower extremity edema bilaterally   ASSESSMENT/PLAN:   Health maintenance:  -due for colonoscopy - will send GI team message with anticoagulation plans surrounding colonoscopy (recommend hold night prior to procedure and resume 24 hours after colonoscopy unless endoscopist prefers to wait longer) -reports has DEXA scan scheduled through her OBGYN office  Pulmonary embolism ([place]) Whether we should continue indefinite anticoagulation is unclear. I did an e-consult with [facility] after our last visit, but there was not a clear recommendation for what to do in her case, other than complete 3 months of anticoagulation at minimum, which patient has done. Engaged in shared decision making and reviewed risks/benefits of continuing anticoagulation in detail with patient. At this time she has elected to continue with lovenox. We are referring to her to hematology to discuss risks/benefits of continued anticoagulation in more detail with a specialist. In  the meantime she will continue lovenox. Refilled today.  Anemia Update CBC today  Fatigue Checking CBC as above. Also update TSH. Higher suspicion for OSA, have ordered split night study.  FOLLOW UP: Follow up in 3 mos for above issues  [place] J. Ardelia Mems, [place] than 30 minutes were spent on this encounter on the day of service, including pre-visit planning, actual face to face time, coordination of care, and documentation of visit.

## 2020-09-25 NOTE — Patient Instructions (Addendum)
It was great to see you again today!  Refilled blood thinner Referring to hematologist  Will send GI doctor a message  Checking blood counts today  Ordered sleep study - someone will call you  Follow up with me in 3 months   Be well, Dr. Ardelia Mems

## 2020-09-25 NOTE — Telephone Encounter (Signed)
   Telephone encounter was:  Successful.  09/25/2020 Name: Sherry Anderson MRN: [id number] DOB: 09/13/1960  Sherry Anderson is a 60 y.o. year old female who is a primary care patient of Ardelia Mems, Delorse Limber, MD . The community resource team was consulted for assistance with [place] guide performed the following interventions: Patient provided with information about care guide support team and interviewed to confirm resource needs Investigation of community resources performed patient said that she has no food insecurities at the moment and is not in need of any other community resources at this time.  .  Follow Up Plan:  No further follow up planned at this time. The patient has been provided with needed resources.  [place], Care Management Phone: [phone] Email: [email or web address]

## 2020-09-26 LAB — CBC
Hematocrit: 42.1 % (ref 34.0–46.6)
Hemoglobin: 14.5 g/dL (ref 11.1–15.9)
MCH: 29.4 pg (ref 26.6–33.0)
MCHC: 34.4 g/dL (ref 31.5–35.7)
MCV: 85 fL (ref 79–97)
Platelets: 301 10*3/uL (ref 150–450)
RBC: 4.94 x10E6/uL (ref 3.77–5.28)
RDW: 15.8 % — ABNORMAL HIGH (ref 11.7–15.4)
WBC: 6.3 10*3/uL (ref 3.4–10.8)

## 2020-09-26 LAB — TSH: TSH: 1.83 u[IU]/mL (ref 0.450–4.500)

## 2020-09-26 NOTE — Assessment & Plan Note (Signed)
Whether we should continue indefinite anticoagulation is unclear. I did an e-consult with [facility] after our last visit, but there was not a clear recommendation for what to do in her case, other than complete 3 months of anticoagulation at minimum, which patient has done. Engaged in shared decision making and reviewed risks/benefits of continuing anticoagulation in detail with patient. At this time she has elected to continue with lovenox. We are referring to her to hematology to discuss risks/benefits of continued anticoagulation in more detail with a specialist. In the meantime she will continue lovenox. Refilled today.

## 2020-09-26 NOTE — Assessment & Plan Note (Signed)
Update CBC today. 

## 2020-09-29 ENCOUNTER — Telehealth (HOSPITAL_COMMUNITY): Payer: Self-pay | Admitting: Licensed Clinical Social Worker

## 2020-09-29 ENCOUNTER — Ambulatory Visit (HOSPITAL_COMMUNITY): Payer: Medicaid Other | Admitting: Licensed Clinical Social Worker

## 2020-09-29 ENCOUNTER — Telehealth: Payer: Self-pay | Admitting: Hematology

## 2020-09-29 ENCOUNTER — Other Ambulatory Visit: Payer: Self-pay

## 2020-09-29 NOTE — Telephone Encounter (Signed)
LCSW sent two links to pt phone with no response. LCSW f/u via PC and left HIPAA compliant VM.

## 2020-09-29 NOTE — Telephone Encounter (Signed)
Received a new hem referral to discuss whether indefinite anticoagulation is warranted. Pt has been cld and scheduled to see Dr. Irene Limbo on 3/1 at 1pm. Pt aware to arrive 20 minutes.

## 2020-10-07 ENCOUNTER — Ambulatory Visit (INDEPENDENT_AMBULATORY_CARE_PROVIDER_SITE_OTHER): Payer: Medicare HMO

## 2020-10-07 ENCOUNTER — Other Ambulatory Visit: Payer: Self-pay | Admitting: Obstetrics & Gynecology

## 2020-10-07 ENCOUNTER — Other Ambulatory Visit: Payer: Self-pay

## 2020-10-07 DIAGNOSIS — Z780 Asymptomatic menopausal state: Secondary | ICD-10-CM

## 2020-10-07 DIAGNOSIS — Z13820 Encounter for screening for osteoporosis: Secondary | ICD-10-CM

## 2020-10-07 DIAGNOSIS — M8589 Other specified disorders of bone density and structure, multiple sites: Secondary | ICD-10-CM

## 2020-10-13 NOTE — Progress Notes (Signed)
HEMATOLOGY/ONCOLOGY CONSULTATION NOTE  Date of Service: 10/14/2020  Patient Care Team: Leeanne Rio, MD as PCP - General (Family Medicine) Pieter Partridge, DO as Consulting Physician (Neurology) Melissa Montane, RN as Case Manager [name] as Social Worker  CHIEF COMPLAINTS/PURPOSE OF CONSULTATION:  Anticoagulation Management  HISTORY OF PRESENTING ILLNESS:   Sherry Anderson is a wonderful 60 y.o. female who has been referred to [place] by Dr. Chrisandra Netters, MD for a discussion whether indefinite anticoagulation is warranted. The pt reports that she is doing well overall.  The pt reports that recently she has not been feeling well. She notes major fatigue and SOB. The pt claims that she is unsure as to why she feels this way, noting that fatigue has caused major sweats. The pt reports she had a surgery in June that helped with the trigeminal nerve pain. They noticed a clot in her LE in  August 2021 and was put on Eliquis. A few months later, the pt notes major SOB that came on unexpectedly after minor movement. They discovered a minor lung PE during this that they deemed unprovoked. The pt notes this SOB was new and she has not been as active recently. These symptoms have not resolved since. The pt is on a blood thinner currently and it was thought the Eliquis was not working, thus switching her to Lovenox. This pt also has a major Hiatal Hernia currently that is great in size. The pt notes that due to her symptoms she is not moving around much and is fairly immobile. The pt was also found to be iron deficient and was put on ferrous Sulfate 325 mg BID. She has stayed consistent with this and notes no issues tolerating.  The pt also noted that her PCP recommended for the pt to see a Psychiatrist for management of her depression medication and management of her fatigue, sweats, and SOB. The pt has not talked to her Psychiatrist recently due to the stigma surrounding it and her  beliefs regarding the situation.  The pt notes that she has two pregnancies and three kids, no miscarriages. The pt denies any use or birth control ever. She notes no issues regarding her pregnancies. She notes her mother has a history of being on Eliquis, but is unsure of the reason for this. She notes no familial blood disorders or autoimmune diseases.  On review of systems, pt reports depression, fatigue, SOB, sweats, neck/shoulder pain, floating sensation in head and denies constipation, diarrhea, lower back pain, abdominal pain, leg swelling, dizziness, lightheadedness, headaches, spinning feeling, sudden weight loss, decreased appetite, and any other symptoms.  MEDICAL HISTORY:  Past Medical History:  Diagnosis Date  . Adenocarcinoma in situ (AIS) of uterine cervix 05/2017   Associated with high-grade dysplasia  . Anxiety and depression   . Breast cancer ([place]) 2002   left breast cancer   . Depression   . Diverticulosis of colon   . DVT (deep venous thrombosis) ([place]) 2021   provoked by surgery  . Essential hypertension, benign   . GERD (gastroesophageal reflux disease)   . Hepatitis    unsure of which type  . Hiatal hernia   . High grade squamous intraepithelial cervical dysplasia 05/2017   Associated with adenocarcinoma in situ  . History of adenomatous polyp of colon    tubular adenoma's  02-08-2017  . History of gastritis   . History of left breast cancer 2002---- per pt no recurrence   dx DCIS ---  s/p  left breast lumpectomy;  per pt radiation therapy completed same year and completed antiestrogen therapy   . History of radiation therapy    2002   left breast  . Hypothyroidism   . Internal hemorrhoids   . Pain    right ear and jaw  . Spinal stenosis   . Spondylolisthesis   . SVD (spontaneous vaginal delivery)    x 1  . Tubular adenoma of colon     SURGICAL HISTORY: Past Surgical History:  Procedure Laterality Date  . BREAST LUMPECTOMY Left 2002   ductal CA in  situ  . CERVICAL CONIZATION W/BX N/A 06/08/2017   Procedure: CONIZATION CERVIX WITH BIOPSY;  Surgeon: Anastasio Auerbach, MD;  Location: [facility];  Service: Gynecology;  Laterality: N/A;  . [place]   twins  . COLONOSCOPY  02-08-2017   dr Karleen Hampshire  . DILATATION & CURETTAGE/HYSTEROSCOPY WITH MYOSURE N/A 06/08/2017   Procedure: [place];  Surgeon: Anastasio Auerbach, MD;  Location: [facility];  Service: Gynecology;  Laterality: N/A;  . ESOPHAGOGASTRODUODENOSCOPY  last one 11-24-2016   dr Karleen Hampshire  . ROBOTIC ASSISTED TOTAL HYSTERECTOMY WITH BILATERAL SALPINGO OOPHERECTOMY N/A 09/21/2017   Procedure: XI ROBOTIC ASSISTED TOTAL HYSTERECTOMY WITH BILATERAL SALPINGO OOPHORECTOMY, Lysis of Adhesions;  Surgeon: Princess Bruins, MD;  Location: [facility];  Service: Gynecology;  Laterality: N/A;  . TOOTH EXTRACTION    . TRANSFORAMINAL LUMBAR INTERBODY FUSION (TLIF) WITH PEDICLE SCREW FIXATION 2 LEVEL N/A 04/13/2018   Procedure: RIGHT-SIDED LUMBAR 4-5 AND LUMBAR 5-SACRUM 1 TRANSFORAMINAL LUMBAR INTERBODY FUSION WITH INSTRUMENTATION AND ALLOGRAFT;  Surgeon: Phylliss Bob, MD;  Location: [facility];  Service: Orthopedics;  Laterality: N/A;  . WISDOM TOOTH EXTRACTION      SOCIAL HISTORY: Social History   Socioeconomic History  . Marital status: Legally Separated    Spouse name: Not on file  . Number of children: 3  . Years of education: 6  . Highest education level: 6th grade  Occupational History  . Occupation: Disability  Tobacco Use  . Smoking status: Never Smoker  . Smokeless tobacco: Never Used  Vaping Use  . Vaping Use: Never used  Substance and Sexual Activity  . Alcohol use: No  . Drug use: No  . Sexual activity: Not Currently    Birth control/protection: Post-menopausal    Comment: 1st intercourse 60 yo-Fewer than 5 partners  Other Topics Concern  . Not on file  Social History Narrative   Moved from  [place] to [place], to [place] at home with her nephew and her son.   Right-handed.   Occasional use of caffeine.   Social Determinants of Health   Financial Resource Strain: Low Risk   . Difficulty of Paying Living Expenses: Not very hard  Food Insecurity: No Food Insecurity  . Worried About Charity fundraiser in the Last Year: Never true  . Ran Out of Food in the Last Year: Never true  Transportation Needs: No Transportation Needs  . Lack of Transportation (Medical): No  . Lack of Transportation (Non-Medical): No  Physical Activity: Insufficiently Active  . Days of Exercise per Week: 3 days  . Minutes of Exercise per Session: 30 min  Stress: No Stress Concern Present  . Feeling of Stress : Only a little  Social Connections: Moderately Isolated  . Frequency of Communication with Friends and Family: More than three times a week  . Frequency of Social Gatherings  with Friends and Family: Once a week  . Attends Religious Services: More than 4 times per year  . Active Member of Clubs or Organizations: No  . Attends Archivist Meetings: Never  . Marital Status: Separated  Intimate Partner Violence: Not At Risk  . Fear of Current or Ex-Partner: No  . Emotionally Abused: No  . Physically Abused: No  . Sexually Abused: No    FAMILY HISTORY: Family History  Problem Relation Age of Onset  . Diabetes Mother   . Hypertension Mother   . Heart disease Father        Pacemaker  . Thyroid disease Sister   . Schizophrenia Brother   . Colon cancer Neg Hx   . Esophageal cancer Neg Hx   . Rectal cancer Neg Hx   . Stomach cancer Neg Hx   . Breast cancer Neg Hx   . Pancreatic cancer Neg Hx     ALLERGIES:  has No Known Allergies.  MEDICATIONS:  Current Outpatient Medications  Medication Sig Dispense Refill  . acetaminophen (TYLENOL) 325 MG tablet Take 650 mg by mouth every 6 (six) hours as needed. OTC PRN    . Calcium Citrate-Vitamin D (CALCIUM + D PO) Take  1 tablet by mouth 2 (two) times daily.    . Cyanocobalamin (B-12 PO) Take by mouth.    . enoxaparin (LOVENOX) 80 MG/0.8ML injection Inject 0.7 mLs (70 mg total) into the skin every 12 (twelve) hours. 48 mL 3  . ferrous sulfate 325 (65 FE) MG tablet Take 325 mg by mouth 2 (two) times daily with a meal.    . levothyroxine (SYNTHROID) 75 MCG tablet TAKE 1 TABLET (75 MCG TOTAL) BY MOUTH EVERY MORNING. 30 MINUTES BEFORE FOOD 90 tablet 3  . lisinopril (ZESTRIL) 20 MG tablet TAKE 1 TABLET BY MOUTH EVERY DAY 90 tablet 3  . omeprazole (PRILOSEC) 20 MG capsule Take 1 capsule (20 mg total) by mouth 2 (two) times daily. 180 capsule 2  . triamterene-hydrochlorothiazide (DYAZIDE) 37.5-25 MG capsule TAKE 1 CAPSULE BY MOUTH EVERY DAY 90 capsule 3  . venlafaxine XR (EFFEXOR-XR) 75 MG 24 hr capsule TAKE 3 CAPSULES BY MOUTH EVERY DAY 270 capsule 1   No current facility-administered medications for this visit.    REVIEW OF SYSTEMS:   10 Point review of Systems was done is negative except as noted above.  PHYSICAL EXAMINATION: ECOG PERFORMANCE STATUS: 2 - Symptomatic, <50% confined to bed  . Vitals:   10/14/20 1323  BP: 117/72  Pulse: 82  Resp: 15  Temp: 97.8 F (36.6 C)  SpO2: 99%   Filed Weights   10/14/20 1323  Weight: 169 lb 14.4 oz (77.1 kg)   .Body mass index is 31.08 kg/m.  GENERAL:alert, in no acute distress and comfortable SKIN: no acute rashes, no significant lesions EYES: conjunctiva are pink and non-injected, sclera anicteric OROPHARYNX: MMM, no exudates, no oropharyngeal erythema or ulceration NECK: supple, no JVD LYMPH:  no palpable lymphadenopathy in the cervical, axillary or inguinal regions LUNGS: clear to auscultation b/l with normal respiratory effort HEART: regular rate & rhythm ABDOMEN:  normoactive bowel sounds , non tender, not distended. Extremity: no pedal edema PSYCH: alert & oriented x 3 with fluent speech NEURO: no focal motor/sensory deficits  LABORATORY DATA:   I have reviewed the data as listed  . CBC Latest Ref Rng & Units 09/25/2020 06/30/2020 04/23/2020  WBC 3.4 - 10.8 x10E3/uL 6.3 7.3 5.1  Hemoglobin 11.1 - 15.9 g/dL 14.5 10.6(L)  11.4  Hematocrit 34.0 - 46.6 % 42.1 33.1(L) 35.6  Platelets 150 - 450 x10E3/uL 301 340 378    . CMP Latest Ref Rng & Units 08/21/2020 06/30/2020 05/26/2020  Glucose 65 - 99 mg/dL - 69 105(H)  BUN 7 - 25 mg/dL - 18 11  Creatinine 0.44 - 1.00 mg/dL 1.00 1.07(H) 0.94  Sodium 135 - 146 mmol/L - 138 137  Potassium 3.5 - 5.3 mmol/L - 4.7 4.1  Chloride 98 - 110 mmol/L - 101 101  CO2 20 - 32 mmol/L - 24 21  Calcium 8.6 - 10.4 mg/dL - 9.8 9.4  Total Protein 6.1 - 8.1 g/dL - 7.8 -  Total Bilirubin 0.2 - 1.2 mg/dL - 0.3 -  Alkaline Phos 38 - 126 U/L - - -  AST 10 - 35 U/L - 23 -  ALT 6 - 29 U/L - 22 -     RADIOGRAPHIC STUDIES: I have personally reviewed the radiological images as listed and agreed with the findings in the report. No results found.   ASSESSMENT & PLAN:   60 yo with   1) Provoked LLE extensive DVT in 03/2020 - femoral and popliteal- provoked by craniotomy/microvascular decompression with infection complication and VP shunt  2) Pulmonary embolism - tiny right sided without rt heart strain. CTA chest. Likely occurred with DVT but was picked on CTA chest later. Would not be considered separately unprovoked and not considered Eliquis failure.  PLAN: -Advised pt that PE is most likely not an unprovoked separate event, but alongside the DVT she had due to surgery. This was so minor that it should not be causing symptoms and was not new. This was a small clot that broke off from major leg DVT. -Advised pt that her current and persistent SOB is most likely not from the PE or residual from the DVT. This is being caused by another underlying factor. -Advised pt we do not need to keep her on Lovenox. The pt did not fail Eliquis and the PE was not new. -Advised pt that her current very major Hiatal Hernia is  causing the SOB and symptoms. Pt is aware to f/u regarding this and symptom management.  -Discussed clot treatment. Advised that major clots we may stay on blood thinners for 9-12 months.  -Discussed major clots and needing fairly major surgery. We would wait 6 weeks regardless and would weigh the risks and benefits rewards prior to deciding on surgery. Recommended 6-9 months.  -Recommended pt f/u w Psychiatrist regarding depression and fatigue management. -Recommended pt walk 20-30 min daily and try to engage in this physical activity. -Recommended pt continue Vitamin B Complex and B12.  -Advised pt to f/u regarding Thyroid medicine management. -Recommended pool therapy and swimming for exercise. Advised pt to discuss w PCP regarding PT. -Recommended smaller, more frequent meals for alleviation of pain while eating in relation to hiatal hernia pain. -Advised pt to switch back to Eliquis. Continue for 9 months total (around three months remaining). Emphasized importance of movement and activity following cessation of Eliquis. -Advised pt it is okay to stop blood thinners for colonoscopy. -Discussed pt past history of clotting and pregnancy. Advised pt that it does not seem she would have inherited risk of clotting due to this. No need for those labs at this time. -Will see back as needed. Will send recommendations and advice to PCP and have them monitor labs and symptoms at this time. -Rx Eliquis.  FOLLOW UP: RTC with Dr Irene Limbo as needed  All of the patients questions were answered with apparent satisfaction. The patient knows to call the clinic with any problems, questions or concerns.  I spent 75 minutes counseling the patient face to face. The total time spent in the appointment was 80 minutes and more than 50% was on counseling and direct patient cares.    Sullivan Lone MD [place] AAHIVMS [facility] Hematology/Oncology Physician [facility]  (Office):       [phone] (Work  cell):  [phone] (Fax):           [phone]  10/14/2020 1:59 PM  I, Reinaldo Raddle, am acting as scribe for Dr. Sullivan Lone, MD.   .I have reviewed the above documentation for accuracy and completeness, and I agree with the above. Brunetta Genera MD

## 2020-10-14 ENCOUNTER — Other Ambulatory Visit: Payer: Self-pay

## 2020-10-14 ENCOUNTER — Inpatient Hospital Stay: Payer: Medicare HMO | Attending: Hematology | Admitting: Hematology

## 2020-10-14 VITALS — BP 117/72 | HR 82 | Temp 97.8°F | Resp 15 | Ht 62.0 in | Wt 169.9 lb

## 2020-10-14 DIAGNOSIS — I2699 Other pulmonary embolism without acute cor pulmonale: Secondary | ICD-10-CM | POA: Diagnosis not present

## 2020-10-14 DIAGNOSIS — I82412 Acute embolism and thrombosis of left femoral vein: Secondary | ICD-10-CM

## 2020-10-14 DIAGNOSIS — Z7901 Long term (current) use of anticoagulants: Secondary | ICD-10-CM

## 2020-10-14 MED ORDER — APIXABAN 5 MG PO TABS: 5.0000 mg | ORAL_TABLET | Freq: Two times a day (BID) | ORAL | 2 refills | Status: DC

## 2020-10-15 ENCOUNTER — Other Ambulatory Visit: Payer: Self-pay

## 2020-10-15 NOTE — Patient Outreach (Signed)
Care Coordination  10/15/2020  Sherry Anderson Feb 06, 1961 [id number]   Medicaid Managed Care   Unsuccessful Outreach Note  10/15/2020 Name: Sherry Anderson MRN: [id number] DOB: December 09, 1960  Referred by: Leeanne Rio, MD Reason for referral : High Risk Managed Medicaid (MM Unsuccessful Telephone Outreach)   An unsuccessful telephone outreach was attempted today. The patient was referred to the case management team for assistance with care management and care coordination.   Follow Up Plan: No further follow up required: Patient has been provided with MM team contact information.  Mickel Fuchs, BSW, [place]  High Risk Managed Medicaid Team

## 2020-10-15 NOTE — Patient Instructions (Signed)
Visit Information  [name]  - as a part of your Medicaid benefit, you are eligible for care management and care coordination services at no cost or copay. I was unable to reach you by phone today but would be happy to help you with your health related needs. Please feel free to call me @ [phone].    Mickel Fuchs, BSW, [place]  High Risk Managed Medicaid Team

## 2020-10-30 ENCOUNTER — Telehealth: Payer: Self-pay | Admitting: Gastroenterology

## 2020-10-30 ENCOUNTER — Telehealth: Payer: Self-pay | Admitting: *Deleted

## 2020-10-30 NOTE — Telephone Encounter (Signed)
We dont have to bring her for office visit but can you please check with her PMD if she is ok to proceed and doesn't need to stay on uninterrupted anticoagulation. Will need clearance to hold Eliquis. Thanks

## 2020-10-30 NOTE — Telephone Encounter (Signed)
Patient scheduled previsit appointment for 12/30/2020 at 1:30pm  Colonoscopy scheduled on 01/14/2021 at 10am   We will get Eliquis clearance

## 2020-10-30 NOTE — Telephone Encounter (Signed)
We have to get the clearance from her provider ourselves. Let me check with Dr Silverio Decamp to see if she needs another office visit or not.

## 2020-10-30 NOTE — Telephone Encounter (Signed)
Patient requesting to schedule colonoscopy.  She is currently taking Eliquis but stated she did get clearance from provider to have procedure.  Please advise if ok to schedule.

## 2020-10-30 NOTE — Telephone Encounter (Signed)
  Request for surgical clearance:     Endoscopy Procedure  What type of surgery is being performed?     colonoscopy  When is this surgery scheduled?     01/14/2021  What type of clearance is required ?   Pharmacy  Are there any medications that need to be held prior to surgery and how long? Eliquis  Practice name and name of physician performing surgery?      [facility]  Dr Silverio Decamp  What is your office phone and fax number?      Phone- [phone]  [phone]  Anesthesia type (None, local, MAC, general) ?       MAC

## 2020-10-31 ENCOUNTER — Telehealth (HOSPITAL_COMMUNITY): Payer: Self-pay | Admitting: Psychiatry

## 2020-10-31 ENCOUNTER — Encounter: Payer: Self-pay | Admitting: Gastroenterology

## 2020-10-31 ENCOUNTER — Other Ambulatory Visit: Payer: Self-pay

## 2020-11-02 NOTE — Telephone Encounter (Signed)
Patient saw hematology who deemed it ok to hold eliquis for colonoscopy.  Patient can hold eliquis for 48 hours prior to the procedure. Can restart 48 hours after, or sooner depending on Dr. Woodward Ku procedural findings and opinion.  Leeanne Rio, MD

## 2020-11-03 NOTE — Telephone Encounter (Signed)
Called patient aware ok to hold Eliquis 48 hours prior to procedure

## 2020-11-05 ENCOUNTER — Other Ambulatory Visit: Payer: Self-pay | Admitting: Family Medicine

## 2020-11-05 DIAGNOSIS — F39 Unspecified mood [affective] disorder: Secondary | ICD-10-CM

## 2020-11-17 ENCOUNTER — Other Ambulatory Visit: Payer: Self-pay | Admitting: *Deleted

## 2020-11-17 ENCOUNTER — Other Ambulatory Visit: Payer: Self-pay

## 2020-11-17 NOTE — Patient Outreach (Signed)
Medicaid Managed Care   Nurse Care Manager Note  11/17/2020 Name:  Sherry Anderson MRN:  [id number] DOB:  1961/04/08  Sherry Anderson is an 60 y.o. year old female who is a primary patient of Sherry Anderson, Sherry Limber, MD.  The [facility] Managed Care Coordination team was consulted for assistance with:    HTN Hx PE  Sherry Anderson was given information about Medicaid Managed Care Coordination team services today. Sherry Anderson agreed to services and verbal consent obtained.  Engaged with patient by telephone for follow up visit in response to provider referral for case management and/or care coordination services.   Assessments/Interventions:  Review of past medical history, allergies, medications, health status, including review of consultants reports, laboratory and other test data, was performed as part of comprehensive evaluation and provision of chronic care management services.  SDOH (Social Determinants of Health) assessments and interventions performed:   Care Plan  No Known Allergies  Medications Reviewed Today    Reviewed by Sherry Montane, RN (Registered Nurse) on 11/17/20 at 1349  Med List Status: <None>  Medication Order Taking? Sig Documenting Provider Last Dose Status Informant  acetaminophen (TYLENOL) 325 MG tablet [id number] Yes Take 650 mg by mouth every 6 (six) hours as needed. OTC PRN [provider] Taking Active   apixaban (ELIQUIS) 5 MG TABS tablet [id number] Yes Take 1 tablet (5 mg total) by mouth 2 (two) times daily. Stop Lovenox and start ELiquis Sherry Genera, MD Taking Active   Calcium Citrate-Vitamin D (CALCIUM + D PO) [id number] Yes Take 1 tablet by mouth 2 (two) times daily. [provider] Taking Active Self           Med Note (,  A   Thu Jul 31, 2020  1:15 PM) Taking once daily  Cyanocobalamin (B-12 PO) [id number] Yes Take by mouth. [provider] Taking Active            Med Note (,  A   Tue Sep 16, 2020  1:14 PM)    ferrous sulfate 325 (65 FE) MG tablet [id number] Yes Take 325 mg by mouth 2 (two) times daily with a meal. [provider] Taking Active   levothyroxine (SYNTHROID) 75 MCG tablet [id number] Yes TAKE 1 TABLET (75 MCG TOTAL) BY MOUTH EVERY MORNING. Sherry Sherry Rio, MD Taking Active   lisinopril (ZESTRIL) 20 MG tablet [id number] Yes TAKE 1 TABLET BY MOUTH EVERY DAY Sherry Rio, MD Taking Active   omeprazole (PRILOSEC) 20 MG capsule [id number] Yes Take 1 capsule (20 mg total) by mouth 2 (two) times daily. Sherry Rio, MD Taking Active   Probiotic CHEW [id number] Yes Chew by mouth. [provider] Taking Active   triamterene-hydrochlorothiazide (DYAZIDE) 37.5-25 MG capsule [id number] Yes TAKE 1 CAPSULE BY MOUTH EVERY DAY Sherry Rio, MD Taking Active   venlafaxine XR (EFFEXOR-XR) 75 MG 24 hr capsule [id number] Yes TAKE 3 CAPSULES BY MOUTH EVERY DAY Chambliss, Sherry Levering, MD Taking Active           Patient Active Problem List   Diagnosis Date Noted  . Pulmonary embolism (Sherry Anderson) 06/04/2020  . Dvt femoral (deep venous thrombosis) ([place]) 04/23/2020  . Syncope 09/24/2019  . Right trigeminal neuralgia 09/13/2019  . GERD (gastroesophageal reflux disease) 09/28/2018  . Dyspnea 09/28/2018  . Dental caries 06/21/2018  . Radiculopathy 04/13/2018  . Subclinical hypothyroidism 11/18/2017  . Atypical cervical glandular cells 04/21/2017  . Right-sided face pain 03/02/2017  . Tremor of  right hand 11/07/2015  . Right-sided sensorineural hearing loss 11/13/2014  . DDD (degenerative disc disease), lumbar 10/21/2014  . Knee pain, bilateral 11/06/2013  . Left wrist pain 03/02/2013  . Anemia 01/25/2012  . Right ear pain 01/19/2012  . HEADACHE 02/04/2010  . Hyperlipidemia 08/07/2009  . HEARING LOSS, RIGHT EAR 01/17/2008  . CARPAL TUNNEL SYNDROME, BILATERAL 07/31/2007  . LATERAL EPICONDYLITIS, BILATERAL 07/18/2007  . Fatigue  due to depression 04/06/2007  . Gastritis and gastroduodenitis 11/21/2006  . Mood disorder ([place]) 10/17/2006  . HYPERTENSION, BENIGN ESSENTIAL 10/17/2006  . CA IN SITU, BREAST 02/15/2001    Conditions to be addressed/monitored per PCP order:  HTN and Hx PE  Care Plan : Cardiovascular Disease Management (DVT/PE)  Updates made by Sherry Montane, RN since 11/17/2020 12:00 AM    Problem: Recent DVT/PE     Long-Range Goal: Cardiovascular Management Plan Developed (DVT/PE)   Start Date: 07/01/2020  Expected End Date: 08/31/2020  Recent Progress: On track  Priority: High  Note:      Current Barriers:  . Chronic Disease Management support and education needs related to recent blood clots and anemia. Ms Sherry Anderson continues to take lovenox injections and needs follow up to discuss length of treatment. She reports recent financial change and would like assistance for food resources. Patient expressed need for vision exam, feels that her vision is getting worse and she would like dental care to fix her teeth. Update-Patient had hematology consult-no longer taking lovenox, now taking Eliquis. Colonoscopy scheduled for 01/14/21. Patient did not attend counseling-missed appointment. Last BP 117/72.  Nurse Case Manager Clinical Goal(s):  [name], patient will attend all scheduled medical appointments-Met . Over the next 30 days, patient will work with CM clinical social worker to discuss feelings of depression-Met . Over the next 60 days, the patient will demonstrate ongoing self health care management ability as evidenced by taking all medications as prescribed, attending scheduled appointments and notifying PCP with any new concerns/symptoms . Over the next 60 days, patient will schedule appointment with PCP and work with BSW for therapy/counseling options-scheduled, but missed appointment Interventions:  . Inter-disciplinary care team collaboration (see longitudinal plan of  care) . Provided education to patient re: iron rich diet; discussed iron rich foods and ways to incorporate these choices into her diet . Reviewed medications with patient and discussed continuing to take medications as prescribed and the importance. Explained that it is normal to have bruising at the injection site with lovenox. Advised to alternate injection sites. Discussed Dr. Assunta Curtis recommendation for taking probiotics. Marland Kitchen Collaborated with LCSW regarding patient having feelings of depression-Patient missed follow up calls with LCSW and BSW. She is interested in establishing appointment for therapy. . Discussed plans with patient for ongoing care management follow up and provided patient with direct contact information for care management team . Encouraged Ms Gwilliam to get outside for 10-15 minutes everyday and take a walk . Reviewed upcoming appointments on 01/14/21 for Colonoscopy . Collaborated with MM Pharmacist for medication review and assistance with 90 day supply of medications . Encouraged Ms. Weins to plan a trip to visit with her family Patient Goals/Self-Care Activities Over the next 60 days, patient will: -Self administers medications as prescribed -Attends all scheduled provider appointments -Calls pharmacy for medication refills -Performs ADL's independently -Performs IADL's independently -Calls provider office for new concerns or question -schedule appointment with PCP  -arrange vision and dental exam  Follow Up Plan: Telephone follow up appointment with  Managed Medicaid care management team member scheduled for: 01/19/21 @ 1pm       Follow Up:  Patient agrees to Care Plan and Follow-up.  Plan: The Managed Medicaid care management team will reach out to the patient again over the next 60 days.  Date/time of next scheduled RN care management/care coordination outreach: 01/19/21 @ 1pm  Lurena Joiner RN, BSN [place]  [employer]or

## 2020-11-17 NOTE — Patient Instructions (Signed)
Visit Information  Sherry Anderson was given information about Medicaid Managed Care team care coordination services as a part of their Healthy [employer] Medicaid benefit. Sherry Anderson verbally consented to engagement with the [facility] Managed Care team.   For questions related to your Healthy [facility] health plan, please call: [phone] or visit the homepage here: [email or web address]  If you would like to schedule transportation through your Healthy [facility] plan, please call the following number at least 2 days in advance of your appointment: [phone]   Call the [facility] at [phone], at any time, 24 hours a day, 7 days a week. If you are in danger or need immediate medical attention call 911.  Sherry. Catapano - following are the goals we discussed in your visit today:  Goals Addressed            This Visit's Progress   . Care Activities for DVT/PE       Over the next 60 days, patient will:  -Self administers medications as prescribed -Attends all scheduled provider appointments -Calls pharmacy for medication refills -Performs ADL's independently -Performs IADL's independently -Calls PCP for new concerns or question -schedule appointment with PCP  -arrange vision and dental exam              Please see education materials related to mood provided by MyChart link.   10 LITTLE Things To Do When You're Feeling Too Down To Do Anything  Take a shower. Even if you plan to stay in all day long and not see a soul, take a shower. It takes the most effort to hop in to the shower but once you do, you'll feel immediate results. It will wake you up and you'll be feeling much fresher (and cleaner too).  Brush and floss your teeth. Give your teeth a good brushing with a floss finish. It's a small task but it feels so good and you can check 'taking care of your health' off the list of things to do.  Do  something small on your list. Most of [place] have some small thing we would like to get done (load of laundry, sew a button, email a friend). Doing one of these things will make you feel like you've accomplished something.  Drink water. Drinking water is easy right? It's also really beneficial for your health so keep a glass beside you all day and take sips often. It gives you energy and prevents you from boredom eating.  Do some floor exercises. The last thing you want to do is exercise but it might be just the thing you need the most. Keep it simple and do exercises that involve sitting or laying on the floor. Even the smallest of exercises release chemicals in the brain that make you feel good. Yoga stretches or core exercises are going to make you feel good with minimal effort.  Make your bed. Making your bed takes a few minutes but it's productive and you'll feel relieved when it's done. An unmade bed is a huge visual reminder that you're having an unproductive day. Do it and consider it your housework for the day.  Put on some nice clothes. Take the sweatpants off even if you don't plan to go anywhere. Put on clothes that make you feel good. Take a look in the mirror so your brain recognizes the sweatpants have been replaced with clothes that make you look great. It's an instant confidence booster.  Wash the dishes. A pile of dirty  dishes in the sink is a reflection of your mood. It's possible that if you wash up the dishes, your mood will follow suit. It's worth a try.  Cook a real meal. If you have the luxury to have a "do nothing" day, you have time to make a real meal for yourself. Make a meal that you love to eat. The process is good to get you out of the funk and the food will ensure you have more energy for tomorrow.  Write out your thoughts by hand. When you hand write, you stimulate your brain to focus on the moment that you're in so make yourself comfortable and write whatever comes  into your mind. Put those thoughts out on paper so they stop spinning around in your head. Those thoughts might be the very thing holding you down.   Patient verbalizes understanding of instructions provided today.   Telephone follow up appointment with Managed Medicaid care management team member scheduled for:01/19/21 @ 1pm  Lurena Joiner RN, BSN [facility] RN Care Coordinator   Following is a copy of your plan of care:  Patient Care Plan: Cardiovascular Disease Management (DVT/PE)    Problem Identified: Recent DVT/PE     Long-Range Goal: Cardiovascular Management Plan Developed (DVT/PE)   Start Date: 07/01/2020  Expected End Date: 08/31/2020  Recent Progress: On track  Priority: High  Note:      Current Barriers:  . Chronic Disease Management support and education needs related to recent blood clots and anemia. Sherry Anderson continues to take lovenox injections and needs follow up to discuss length of treatment. She reports recent financial change and would like assistance for food resources. Patient expressed need for vision exam, feels that her vision is getting worse and she would like dental care to fix her teeth. Update-Patient had hematology consult-no longer taking lovenox, now taking Eliquis. Colonoscopy scheduled for 01/14/21. Patient did not attend counseling-missed appointment. Last BP 117/72.  Nurse Case Manager Clinical Goal(s):  [name], patient will attend all scheduled medical appointments-Met . Over the next 30 days, patient will work with CM clinical social worker to discuss feelings of depression-Met . Over the next 60 days, the patient will demonstrate ongoing self health care management ability as evidenced by taking all medications as prescribed, attending scheduled appointments and notifying PCP with any new concerns/symptoms . Over the next 60 days, patient will schedule appointment with PCP and work with BSW for  therapy/counseling options-scheduled, but missed appointment Interventions:  . Inter-disciplinary care team collaboration (see longitudinal plan of care) . Provided education to patient re: iron rich diet; discussed iron rich foods and ways to incorporate these choices into her diet . Reviewed medications with patient and discussed continuing to take medications as prescribed and the importance. Explained that it is normal to have bruising at the injection site with lovenox. Advised to alternate injection sites. Discussed Dr. Assunta Curtis recommendation for taking probiotics. Marland Kitchen Collaborated with LCSW regarding patient having feelings of depression-Patient missed follow up calls with LCSW and BSW. She is interested in establishing appointment for therapy. . Discussed plans with patient for ongoing care management follow up and provided patient with direct contact information for care management team . Encouraged Sherry Anderson to get outside for 10-15 minutes everyday and take a walk . Reviewed upcoming appointments on 01/14/21 for Colonoscopy . Collaborated with MM Pharmacist for medication review and assistance with 90 day supply of medications . Encouraged Sherry Anderson  to plan a trip to visit with her family Patient Goals/Self-Care Activities Over the next 60 days, patient will: -Self administers medications as prescribed -Attends all scheduled provider appointments -Calls pharmacy for medication refills -Performs ADL's independently -Performs IADL's independently -Calls PCP for new concerns or question -schedule appointment with PCP  -arrange vision and dental exam  Follow Up Plan: Telephone follow up appointment with Managed Medicaid care management team member scheduled for: 01/19/21 @ 1pm     Patient Care Plan: Social Work Plan for Depression (Adult)    Problem Identified: Symptoms (Depression)     Long-Range Goal: Symptoms Monitored and Managed   Start Date: 07/22/2020  Expected End Date:  09/20/2020  Recent Progress: On track  Priority: High  Note:   Current Barriers:  . Chronic Mental Health needs related to depression. . Social Isolation . Lacks knowledge of community resource: Patient was given a list by her PCP of community health providers but she does not know where to start with identifying the appropriate provider.  . Suicidal Ideation/Homicidal Ideation: No  Clinical Social Work Goal(s):  Marland Kitchen Over the next 60 days, patient will work with SW monthly by telephone or in person to reduce or manage symptoms related to depression. . Over the next 60 days, patient will work with BSW to address concerns related to scheduling appointments for therapy and medication management.  Interventions: . Patient interviewed and appropriate assessments performed: PHQ 2 and PHQ 9 . Patient interviewed and appropriate assessments performed . Provided mental health counseling with regard to depression and the effects of trauma   . Provided patient with information about the connection between mental health and physical health. [name] with RN Case Manager re: patient's mental health needs. [name] with BSW re: scheduling patient for therapy at [facility] [phone], [phone]. Marland Kitchen Emotional/Supportive Counseling . Referred to psychiatrist . Referred to counselor/psychotherapist . BSW contacted [facility] to schedule patient's therapy. They do offer walk in services throught out the week, patient stated she would prefer an appointment. BSW left a voicemail for a representative to call back to make an appointment. Marland Kitchen Update 09/17/20: Patient  stated she was still interested in therapy and medication management. BSW scheduled patient for therapy at [facility] on 09/29/20 at 1pm and medication management on 10/31/20 at 9:20am. Both appointments will be virtual.   Patient Self Care Activities:  . Self administers medications as  prescribed . Attends all scheduled provider appointments . Calls pharmacy for medication refills . Performs ADL's independently . Performs IADL's independently . Ability for insight . Independent living . Motivation for treatment  Patient Coping Strengths:  . Spirituality . Hopefulness . Self Advocate . Able to Communicate Effectively  Patient Self Care Deficits:  . Lacks social connections  Initial goal documentation    Long-Range Goal: Track and Manage My Symptoms-Depression   Start Date: 07/22/2020  Expected End Date: 09/20/2020  This Visit's Progress: On track  Priority: High  Note:   Timeframe:  Long-Range Goal Priority:  High Start Date:  07/22/2020                           Expected End Date:   02/05/202                    Follow Up Date 08/19/2020 @ 9:00am   - exercise at least 2 to 3 times per week -  have a plan for how to handle bad days - watch for early signs of feeling worse    Why is this important?    Keeping track of your progress will help your treatment team find the right mix of medicine and therapy for you.   Write in your journal every day.   Day-to-day changes in depression symptoms are normal. It may be more helpful to check your progress at the end of each week instead of every day.     Notes:

## 2020-12-02 ENCOUNTER — Other Ambulatory Visit: Payer: Self-pay | Admitting: Family Medicine

## 2020-12-02 DIAGNOSIS — F39 Unspecified mood [affective] disorder: Secondary | ICD-10-CM

## 2020-12-04 ENCOUNTER — Ambulatory Visit: Payer: Medicare HMO

## 2020-12-11 ENCOUNTER — Other Ambulatory Visit: Payer: Self-pay

## 2020-12-11 ENCOUNTER — Ambulatory Visit (INDEPENDENT_AMBULATORY_CARE_PROVIDER_SITE_OTHER): Payer: Medicare HMO | Admitting: Family Medicine

## 2020-12-11 ENCOUNTER — Ambulatory Visit (INDEPENDENT_AMBULATORY_CARE_PROVIDER_SITE_OTHER): Payer: Medicare HMO

## 2020-12-11 ENCOUNTER — Encounter: Payer: Self-pay | Admitting: Family Medicine

## 2020-12-11 VITALS — BP 122/72 | HR 60 | Wt 168.8 lb

## 2020-12-11 DIAGNOSIS — R1030 Lower abdominal pain, unspecified: Secondary | ICD-10-CM | POA: Diagnosis not present

## 2020-12-11 DIAGNOSIS — I2699 Other pulmonary embolism without acute cor pulmonale: Secondary | ICD-10-CM

## 2020-12-11 DIAGNOSIS — Z23 Encounter for immunization: Secondary | ICD-10-CM | POA: Diagnosis not present

## 2020-12-11 DIAGNOSIS — R198 Other specified symptoms and signs involving the digestive system and abdomen: Secondary | ICD-10-CM | POA: Insufficient documentation

## 2020-12-11 DIAGNOSIS — R3989 Other symptoms and signs involving the genitourinary system: Secondary | ICD-10-CM | POA: Diagnosis not present

## 2020-12-11 LAB — POCT URINALYSIS DIP (MANUAL ENTRY)
Bilirubin, UA: NEGATIVE
Blood, UA: NEGATIVE
Glucose, UA: NEGATIVE mg/dL
Ketones, POC UA: NEGATIVE mg/dL
Leukocytes, UA: NEGATIVE
Nitrite, UA: NEGATIVE
Protein Ur, POC: NEGATIVE mg/dL
Spec Grav, UA: 1.02 (ref 1.010–1.025)
Urobilinogen, UA: 0.2 E.U./dL
pH, UA: 6 (ref 5.0–8.0)

## 2020-12-11 NOTE — Progress Notes (Signed)
    SUBJECTIVE:   CHIEF COMPLAINT / HPI:   Sherry Anderson presents today for follow up.  Bowel and Bladder Urgency Sherry Anderson endorses 3 weeks of "kind of sharp, cramping" lower abdominal pain. She endorses urinary and bowel urgency with occasional incontinence. Endorses a sensation of incomplete urinary emptying. Endorses dysuria and urinary frequency at night; urinates about 5 times during the day. Urine is yellow without signs of blood. No blood or mucous observed in stool. Denies any fevers.  DVT/PE Saw hematology Dr. Irene Limbo who recommended 9 months eliquis total, she continues on this medication now without any issues.  OBJECTIVE:   BP 122/72   Pulse 60   Wt 168 lb 12.8 oz (76.6 kg)   LMP  (LMP Unknown)   SpO2 99%   BMI 30.87 kg/m   Physical Exam Exam conducted with a chaperone present.  Constitutional:      Appearance: Normal appearance. She is not ill-appearing.  Cardiovascular:     Rate and Rhythm: Normal rate and regular rhythm.     Heart sounds: Normal heart sounds.  Pulmonary:     Effort: Pulmonary effort is normal.     Breath sounds: Normal breath sounds.  Abdominal:     General: There is no distension.     Palpations: Abdomen is soft. There is no mass.     Tenderness: There is no abdominal tenderness. There is no right CVA tenderness, left CVA tenderness, guarding or rebound.     Hernia: No hernia is present. There is no hernia in the left inguinal area or right inguinal area.  Genitourinary:    General: Normal vulva.     Urethra: No prolapse.     Vagina: No vaginal discharge.     Rectum: Normal.     Comments: Speculum example showed vaginal atrophy with normal vaginal cuff.  Bimanual exam unemarkable - no adnexal or pelvic tenderness appreciated No signs of prolapse. External anal sphincter tone intact. Exam performed by Dr. Freda Munro present Sherry Anderson CMA Skin:    General: Skin is warm and dry.  Neurological:     Mental Status: She  is alert and oriented to person, place, and time.  Psychiatric:        Mood and Affect: Mood normal.        Behavior: Behavior normal.     ASSESSMENT/PLAN:   Alteration in bowel and bladder function Pt w/ bowel and bladder urgency with occasional incontinence. Collected UA which was normal- will also culture. No signs or symptoms of infectious etiology. No visible prolapse for obstructive etiology. Reviewed meds for possible urinary retentive etiology but no cause identified. Absence of constitutional symptoms is reassuring. History of hysterectomy noted.  Plan: Culture urine Referral to urogynecology RTC in 6 weeks, sooner if necessary  Pulmonary embolism [facility]) Saw hematology who recommended 9 months of treatment with eliquis, plan to end treatment in June. Tolerating eliquis well for now. Continue this medication.      [place]le[place]    Patient seen along with MS3 student [place]. I personally evaluated this patient along with the student, and verified all aspects of the history, physical exam, and medical decision making as documented by the student. I agree with the student's documentation and have made all necessary edits.  Chrisandra Netters, MD  [place]

## 2020-12-11 NOTE — Assessment & Plan Note (Addendum)
Pt w/ bowel and bladder urgency with occasional incontinence. Collected UA which was normal- will also culture. No signs or symptoms of infectious etiology. No visible prolapse for obstructive etiology. Reviewed meds for possible urinary retentive etiology but no cause identified. Absence of constitutional symptoms is reassuring. History of hysterectomy noted.  Plan: Culture urine Referral to urogynecology RTC in 6 weeks, sooner if necessary

## 2020-12-11 NOTE — Patient Instructions (Addendum)
It was great to see you again today!  Will culture your urine Referring to urogynecologist  Follow up with me in 6 weeks, sooner if needed  Be well, Dr. Ardelia Mems

## 2020-12-14 LAB — URINE CULTURE: Organism ID, Bacteria: NO GROWTH

## 2020-12-15 NOTE — Assessment & Plan Note (Signed)
Saw hematology who recommended 9 months of treatment with eliquis, plan to end treatment in June. Tolerating eliquis well for now. Continue this medication.

## 2020-12-30 ENCOUNTER — Other Ambulatory Visit: Payer: Self-pay

## 2020-12-30 ENCOUNTER — Ambulatory Visit (AMBULATORY_SURGERY_CENTER): Payer: Self-pay

## 2020-12-30 VITALS — Ht 62.0 in | Wt 169.2 lb

## 2020-12-30 DIAGNOSIS — Z86010 Personal history of colonic polyps: Secondary | ICD-10-CM

## 2020-12-30 MED ORDER — NA SULFATE-K SULFATE-MG SULF 17.5-3.13-1.6 GM/177ML PO SOLN: 1.0000 | Freq: Once | ORAL | 0 refills | Status: AC

## 2020-12-30 NOTE — Progress Notes (Signed)
No egg or soy allergy known to patient  No issues with past sedation with any surgeries or procedures Patient denies ever being told they had issues or difficulty with intubation  No FH of Malignant Hyperthermia No diet pills per patient No home 02 use per patient  Pt denies issues with constipation  No A fib or A flutter  EMMI video to pt or via [place] 19 guidelines implemented in PV today with Pt and RN  Pt is fully vaccinated  for Covid   Due to the COVID-19 pandemic we are asking patients to follow certain guidelines.  Pt aware of COVID protocols and [facility] guidelines

## 2021-01-14 ENCOUNTER — Ambulatory Visit (AMBULATORY_SURGERY_CENTER): Payer: Medicare HMO | Admitting: Gastroenterology

## 2021-01-14 ENCOUNTER — Other Ambulatory Visit: Payer: Self-pay

## 2021-01-14 ENCOUNTER — Encounter: Payer: Medicare HMO | Admitting: Gastroenterology

## 2021-01-14 ENCOUNTER — Encounter: Payer: Self-pay | Admitting: Gastroenterology

## 2021-01-14 VITALS — BP 99/60 | HR 65 | Temp 97.6°F | Resp 17 | Ht 62.0 in | Wt 169.0 lb

## 2021-01-14 DIAGNOSIS — Z86010 Personal history of colonic polyps: Secondary | ICD-10-CM | POA: Diagnosis present

## 2021-01-14 DIAGNOSIS — Z1211 Encounter for screening for malignant neoplasm of colon: Secondary | ICD-10-CM | POA: Diagnosis not present

## 2021-01-14 DIAGNOSIS — Z8371 Family history of colonic polyps: Secondary | ICD-10-CM | POA: Diagnosis not present

## 2021-01-14 MED ORDER — SODIUM CHLORIDE 0.9 % IV SOLN: 500.0000 mL | Freq: Once | INTRAVENOUS | Status: DC

## 2021-01-14 NOTE — Progress Notes (Signed)
[place] vital signs. 

## 2021-01-14 NOTE — Progress Notes (Signed)
Pt's states no medical or surgical changes since previsit or office visit. 

## 2021-01-14 NOTE — Op Note (Signed)
[place] Patient Name: Sherry Anderson Procedure Date: 01/14/2021 10:21 AM MRN: [id number] Endoscopist: Mauri Pole , MD Age: 60 Referring MD:  Date of Birth: 1961/01/04 Gender: Female Account #: [id number] Procedure:                Colonoscopy Indications:              High risk colon cancer surveillance: Personal                            history of colonic polyps, High risk colon cancer                            surveillance: Personal history of multiple (3 or                            more) adenomas Medicines:                Monitored Anesthesia Care Procedure:                Pre-Anesthesia Assessment:                           - Prior to the procedure, a History and Physical                            was performed, and patient medications and                            allergies were reviewed. The patient's tolerance of                            previous anesthesia was also reviewed. The risks                            and benefits of the procedure and the sedation                            options and risks were discussed with the patient.                            All questions were answered, and informed consent                            was obtained. Prior Anticoagulants: The patient                            last took Eliquis (apixaban) 2 days prior to the                            procedure. ASA Grade Assessment: II - A patient                            with mild systemic disease. After reviewing the  risks and benefits, the patient was deemed in                            satisfactory condition to undergo the procedure.                           After obtaining informed consent, the colonoscope                            was passed under direct vision. Throughout the                            procedure, the patient's blood pressure, pulse, and                            oxygen saturations were monitored  continuously. The                            Olympus [id number]-[id number] (#[id number]) Colonoscope was                            introduced through the anus and advanced to the the                            cecum, identified by appendiceal orifice and                            ileocecal valve. The colonoscopy was performed                            without difficulty. The patient tolerated the                            procedure well. The quality of the bowel                            preparation was good. The ileocecal valve,                            appendiceal orifice, and rectum were photographed. Scope In: 10:28:30 AM Scope Out: 10:40:14 AM Scope Withdrawal Time: 0 hours 7 minutes 46 seconds  Total Procedure Duration: 0 hours 11 minutes 44 seconds  Findings:                 The perianal and digital rectal examinations were                            normal.                           Non-bleeding external and internal hemorrhoids were                            found during retroflexion. The hemorrhoids were  medium-sized.                           The exam was otherwise without abnormality. Complications:            No immediate complications. Estimated Blood Loss:     Estimated blood loss was minimal. Impression:               - Non-bleeding external and internal hemorrhoids.                           - The examination was otherwise normal.                           - No specimens collected. Recommendation:           - Patient has a contact number available for                            emergencies. The signs and symptoms of potential                            delayed complications were discussed with the                            patient. Return to normal activities tomorrow.                            Written discharge instructions were provided to the                            patient.                           - Resume previous diet.                            - Continue present medications.                           - Repeat colonoscopy in 5 years for surveillance.                           - Return to GI clinic PRN.                           - Resume Eliquis (apixaban) at prior dose today.                            Refer to managing physician for further adjustment                            of therapy. Mauri Pole, MD 01/14/2021 10:45:47 AM This report has been signed electronically.

## 2021-01-14 NOTE — Patient Instructions (Signed)
Resume Eliquis ( apixaban) at prior dose today.  Refer to managing physician for further adjustment of therapy.    YOU HAD AN ENDOSCOPIC PROCEDURE TODAY AT [facility]:   Refer to the procedure report that was given to you for any specific questions about what was found during the examination.  If the procedure report does not answer your questions, please call your gastroenterologist to clarify.  If you requested that your care partner not be given the details of your procedure findings, then the procedure report has been included in a sealed envelope for you to review at your convenience later.  YOU SHOULD EXPECT: Some feelings of bloating in the abdomen. Passage of more gas than usual.  Walking can help get rid of the air that was put into your GI tract during the procedure and reduce the bloating. If you had a lower endoscopy (such as a colonoscopy or flexible sigmoidoscopy) you may notice spotting of blood in your stool or on the toilet paper. If you underwent a bowel prep for your procedure, you may not have a normal bowel movement for a few days.  Please Note:  You might notice some irritation and congestion in your nose or some drainage.  This is from the oxygen used during your procedure.  There is no need for concern and it should clear up in a day or so.  SYMPTOMS TO REPORT IMMEDIATELY:   Following lower endoscopy (colonoscopy or flexible sigmoidoscopy):  Excessive amounts of blood in the stool  Significant tenderness or worsening of abdominal pains  Swelling of the abdomen that is new, acute  Fever of 100F or higher   For urgent or emergent issues, a gastroenterologist can be reached at any hour by calling [phone]. Do not use MyChart messaging for urgent concerns.    DIET:  We do recommend a small meal at first, but then you may proceed to your regular diet.  Drink plenty of fluids but you should avoid alcoholic beverages for 24 hours.  ACTIVITY:  You  should plan to take it easy for the rest of today and you should NOT DRIVE or use heavy machinery until tomorrow (because of the sedation medicines used during the test).    FOLLOW UP: Our staff will call the number listed on your records 48-72 hours following your procedure to check on you and address any questions or concerns that you may have regarding the information given to you following your procedure. If we do not reach you, we will leave a message.  We will attempt to reach you two times.  During this call, we will ask if you have developed any symptoms of COVID 19. If you develop any symptoms (ie: fever, flu-like symptoms, shortness of breath, cough etc.) before then, please call [phone].  If you test positive for Covid 19 in the 2 weeks post procedure, please call and report this information to [place].    If any biopsies were taken you will be contacted by phone or by letter within the next 1-3 weeks.  Please call us at [phone] if you have not heard about the biopsies in 3 weeks.    SIGNATURES/CONFIDENTIALITY: You and/or your care partner have signed paperwork which will be entered into your electronic medical record.  These signatures attest to the fact that that the information above on your After Visit Summary has been reviewed and is understood.  Full responsibility of the confidentiality of this discharge information lies with you  and/or your care-partner. 

## 2021-01-14 NOTE — Progress Notes (Signed)
PT taken to PACU. Monitors in place. VSS. Report given to RN. 

## 2021-01-16 ENCOUNTER — Telehealth: Payer: Self-pay | Admitting: *Deleted

## 2021-01-16 NOTE — Telephone Encounter (Signed)
1. Have you developed a fever since your procedure? no  2.   Have you had an respiratory symptoms (SOB or cough) since your procedure? no  3.   Have you tested positive for COVID 19 since your procedure no  4.   Have you had any family members/close contacts diagnosed with the COVID 19 since your procedure?  no   If yes to any of these questions please route to Joylene John, RN and Joella Prince, RN Follow up Call-  Call back number 01/14/2021  Post procedure Call Back phone  # [phone]  Permission to leave phone message Yes  Some recent data might be hidden     Patient questions:  Do you have a fever, pain , or abdominal swelling? No. Pain Score  0 *  Have you tolerated food without any problems? Yes.    Have you been able to return to your normal activities? Yes.    Do you have any questions about your discharge instructions: Diet   No. Medications  No. Follow up visit  No.  Do you have questions or concerns about your Care? No.  Actions: * If pain score is 4 or above: No action needed, pain <4.

## 2021-01-19 ENCOUNTER — Other Ambulatory Visit: Payer: Self-pay

## 2021-01-19 ENCOUNTER — Other Ambulatory Visit: Payer: Self-pay | Admitting: *Deleted

## 2021-01-19 NOTE — Patient Outreach (Signed)
Medicaid Managed Care   Nurse Care Manager Note  01/19/2021 Name:  Rylen Hou MRN:  [id number] DOB:  12-Aug-1961  Cortny Bambach is an 60 y.o. year old female who is a primary patient of Ardelia Mems, Delorse Limber, MD.  The [facility] Managed Care Coordination team was consulted for assistance with:    HTN Hx DVT  Ms. Bjorklund was given information about Medicaid Managed Care Coordination team services today. [name] agreed to services and verbal consent obtained.  Engaged with patient by telephone for follow up visit in response to provider referral for case management and/or care coordination services.   Assessments/Interventions:  Review of past medical history, allergies, medications, health status, including review of consultants reports, laboratory and other test data, was performed as part of comprehensive evaluation and provision of chronic care management services.  SDOH (Social Determinants of Health) assessments and interventions performed:   Care Plan  No Known Allergies  Medications Reviewed Today    Reviewed by Melissa Montane, RN (Registered Nurse) on 01/19/21 at 1324  Med List Status: <None>  Medication Order Taking? Sig Documenting Provider Last Dose Status Informant  acetaminophen (TYLENOL) 325 MG tablet [id number] Yes Take 650 mg by mouth every 6 (six) hours as needed. OTC PRN [provider] Taking Active   apixaban (ELIQUIS) 5 MG TABS tablet [id number] Yes Take 1 tablet (5 mg total) by mouth 2 (two) times daily. Stop Lovenox and start ELiquis Brunetta Genera, MD Taking Active   Calcium Citrate-Vitamin D (CALCIUM + D PO) [id number] Yes Take 1 tablet by mouth 2 (two) times daily. [provider] Taking Active Self           Med Note (Chandel Zaun A   Thu Jul 31, 2020  1:15 PM) Taking once daily  Cyanocobalamin (B-12 PO) [id number] Yes Take by mouth. [provider] Taking Active            Med Note (Everson Mott A   Tue  Sep 16, 2020  1:14 PM)    ferrous sulfate 325 (65 FE) MG tablet [id number] Yes Take 325 mg by mouth 2 (two) times daily with a meal. [provider] Taking Active   levothyroxine (SYNTHROID) 75 MCG tablet [id number] Yes TAKE 1 TABLET (75 MCG TOTAL) BY MOUTH EVERY MORNING. [place] Leeanne Rio, MD Taking Active   lisinopril (ZESTRIL) 20 MG tablet [id number] Yes TAKE 1 TABLET BY MOUTH EVERY DAY Leeanne Rio, MD Taking Active   omeprazole (PRILOSEC) 20 MG capsule [id number] Yes Take 1 capsule (20 mg total) by mouth 2 (two) times daily. Leeanne Rio, MD Taking Active   Probiotic CHEW [id number] Yes Chew by mouth. [provider] Taking Active   triamterene-hydrochlorothiazide (DYAZIDE) 37.5-25 MG capsule [id number] Yes TAKE 1 CAPSULE BY MOUTH EVERY DAY Leeanne Rio, MD Taking Active   venlafaxine XR (EFFEXOR-XR) 75 MG 24 hr capsule [id number] Yes TAKE 3 CAPSULES BY MOUTH EVERY DAY Martyn Malay, MD Taking Active           Patient Active Problem List   Diagnosis Date Noted  . Alteration in bowel and bladder function 12/11/2020  . Pulmonary embolism ([place]) 06/04/2020  . Dvt femoral (deep venous thrombosis) ([place]) 04/23/2020  . Syncope 09/24/2019  . Right trigeminal neuralgia 09/13/2019  . GERD (gastroesophageal reflux disease) 09/28/2018  . Dyspnea 09/28/2018  . Dental caries 06/21/2018  . Radiculopathy 04/13/2018  . Subclinical hypothyroidism 11/18/2017  . Atypical cervical glandular cells 04/21/2017  .  Right-sided face pain 03/02/2017  . Tremor of right hand 11/07/2015  . Right-sided sensorineural hearing loss 11/13/2014  . DDD (degenerative disc disease), lumbar 10/21/2014  . Knee pain, bilateral 11/06/2013  . Left wrist pain 03/02/2013  . Anemia 01/25/2012  . Right ear pain 01/19/2012  . HEADACHE 02/04/2010  . Hyperlipidemia 08/07/2009  . HEARING LOSS, RIGHT EAR 01/17/2008  . CARPAL TUNNEL SYNDROME, BILATERAL 07/31/2007  .  LATERAL EPICONDYLITIS, BILATERAL 07/18/2007  . Fatigue due to depression 04/06/2007  . Gastritis and gastroduodenitis 11/21/2006  . Mood disorder ([place]) 10/17/2006  . HYPERTENSION, BENIGN ESSENTIAL 10/17/2006  . CA IN SITU, BREAST 02/15/2001    Conditions to be addressed/monitored per PCP order:  HTN and Hx DVT  Care Plan : Cardiovascular Disease Management (DVT/PE)  Updates made by Melissa Montane, RN since 01/19/2021 12:00 AM    Problem: Recent DVT/PE     Long-Range Goal: Cardiovascular Management Plan Developed (DVT/PE)   Start Date: 07/01/2020  Expected End Date: 03/23/2021  This Visit's Progress: On track  Recent Progress: On track  Priority: High  Note:      Current Barriers:  . Chronic Disease Management support and education needs related to recent blood clots and anemia. Ms Kanady continues to take lovenox injections and needs follow up to discuss length of treatment. She reports recent financial change and would like assistance for food resources. Patient expressed need for vision exam, feels that her vision is getting worse and she would like dental care to fix her teeth. Patient had hematology consult-no longer taking lovenox, now taking Eliquis. Colonoscopy scheduled for 01/14/21. Patient did not attend counseling-missed appointment. Last BP 117/72.-Update-Ms. Sunde has a consult with Urogynecology on 7/26 for bowel and bladder urgency and incontinence. She had a colonoscopy on 6/1, internal and external hemorrhoids noted, otherwise normal. She has had a sore throat, cough and fever the last few days, but feels like she is getting better today. Continues to want to travel to [place] to visit her brother and will plan a trip after her appointment on 7/26.  Nurse Case Manager Clinical Goal(s):  . patient will attend all scheduled medical appointments-Met . patient will work with CM clinical social worker to discuss feelings of depression-Met . patient will demonstrate ongoing  self health care management ability as evidenced by taking all medications as prescribed, attending scheduled appointments and notifying PCP with any new concerns/symptoms . patient will schedule appointment with PCP and work with BSW for therapy/counseling options-scheduled, but missed appointment-would like to plan after her visit to [place] Interventions:  . Inter-disciplinary care team collaboration (see longitudinal plan of care) . Provided education to patient re: iron rich diet; discussed iron rich foods and ways to incorporate these choices into her diet . Reviewed medications with patient and discussed continuing to take medications as prescribed and the importance. Explained that it is normal to have bruising at the injection site with lovenox. Advised to alternate injection sites. Discussed Dr. Assunta Curtis recommendation for taking probiotics. Marland Kitchen Collaborated with LCSW regarding patient having feelings of depression-Patient missed follow up calls with LCSW and BSW. She is interested in establishing appointment for therapy. . Discussed plans with patient for ongoing care management follow up and provided patient with direct contact information for care management team . Reviewed upcoming appointments: 6/23 with PCP and 7/26 with Urogynecology . Collaborated with [facility] Pharmacist for medication review and assistance with 90 day supply of medications and delivery . Encouraged Ms. Prewitt to plan a trip to visit  with her family . Provided therapeutic listening . Discussed avoiding constipation and straining with bowel movements. Durward Fortes to notify PCP if symptoms worsen Patient Goals/Self-Care Activities -Self administers medications as prescribed -Attends all scheduled provider appointments -Calls pharmacy for medication refills -Performs ADL's independently -Performs IADL's independently -Calls PCP for new concerns or question -schedule appointment with PCP  -arrange vision and dental  exam Follow Up Plan: Telephone follow up appointment with Managed Medicaid care management team member scheduled for: 03/23/21 @ 2:30pm       Follow Up:  Patient agrees to Care Plan and Follow-up.  Plan: The Managed Medicaid care management team will reach out to the patient again over the next 60 days.  Date/time of next scheduled RN care management/care coordination outreach: 03/23/21 @ 2:30pm  Lurena Joiner RN, [place] RN Care Coordinator

## 2021-01-19 NOTE — Patient Instructions (Signed)
Visit Information  Sherry Anderson was given information about Medicaid Managed Care team care coordination services as a part of their [employer] Medicaid benefit. Sherry Anderson verbally consented to engagement with the [facility] Managed Care team.   For questions related to your Healthy [facility] health plan, please call: [phone] or visit the homepage here: [email or web address]  If you would like to schedule transportation through your Healthy [facility] plan, please call the following number at least 2 days in advance of your appointment: [phone]   Call the [place] at [phone], at any time, 24 hours a day, 7 days a week. If you are in danger or need immediate medical attention call 911.  Sherry Anderson - following are the goals we discussed in your visit today:  Goals Addressed            This Visit's Progress   . Care Activities for DVT/PE       Follow up 03/23/21  Over the next 60 days, patient will:  -Self administers medications as prescribed -Attends all scheduled provider appointments -Calls pharmacy for medication refills -Performs ADL's independently -Performs IADL's independently -Calls PCP for new concerns or question -schedule appointment with PCP  -arrange vision and dental exam              Please see education materials related to hemorrhoids provided by MyChart link.  Hemorrhoids Hemorrhoids are swollen veins that may develop:  In the butt (rectum). These are called internal hemorrhoids.  Around the opening of the butt (anus). These are called external hemorrhoids. Hemorrhoids can cause pain, itching, or bleeding. Most of the time, they do not cause serious problems. They usually get better with diet changes, lifestyle changes, and other home treatments. What are the causes? This condition may be caused by:  Having trouble pooping (constipation).  Pushing hard  (straining) to poop.  Watery poop (diarrhea).  Pregnancy.  Being very overweight (obese).  Sitting for long periods of time.  Heavy lifting or other activity that causes you to strain.  Anal sex.  Riding a bike for a long period of time. What are the signs or symptoms? Symptoms of this condition include:  Pain.  Itching or soreness in the butt.  Bleeding from the butt.  Leaking poop.  Swelling in the area.  One or more lumps around the opening of your butt. How is this diagnosed? A doctor can often diagnose this condition by looking at the affected area. The doctor may also:  Do an exam that involves feeling the area with a gloved hand (digital rectal exam).  Examine the area inside your butt using a small tube (anoscope).  Order blood tests. This may be done if you have lost a lot of blood.  Have you get a test that involves looking inside the colon using a flexible tube with a camera on the end (sigmoidoscopy or colonoscopy). How is this treated? This condition can usually be treated at home. Your doctor may tell you to change what you eat, make lifestyle changes, or try home treatments. If these do not help, procedures can be done to remove the hemorrhoids or make them smaller. These may involve:  Placing rubber bands at the base of the hemorrhoids to cut off their blood supply.  Injecting medicine into the hemorrhoids to shrink them.  Shining a type of light energy onto the hemorrhoids to cause them to fall off.  Doing surgery to remove the hemorrhoids or cut off their blood  supply. Follow these instructions at home: Eating and drinking  Eat foods that have a lot of fiber in them. These include whole grains, beans, nuts, fruits, and vegetables.  Ask your doctor about taking products that have added fiber (fibersupplements).  Reduce the amount of fat in your diet. You can do this by: ? Eating low-fat dairy products. ? Eating less red meat. ? Avoiding  processed foods.  Drink enough fluid to keep your pee (urine) pale yellow.   Managing pain and swelling  Take a warm-water bath (sitz bath) for 20 minutes to ease pain. Do this 3-4 times a day. You may do this in a bathtub or using a portable sitz bath that fits over the toilet.  If told, put ice on the painful area. It may be helpful to use ice between your warm baths. ? Put ice in a plastic bag. ? Place a towel between your skin and the bag. ? Leave the ice on for 20 minutes, 2-3 times a day.   General instructions  Take over-the-counter and prescription medicines only as told by your doctor. ? Medicated creams and medicines may be used as told.  Exercise often. Ask your doctor how much and what kind of exercise is best for you.  Go to the bathroom when you have the urge to poop. Do not wait.  Avoid pushing too hard when you poop.  Keep your butt dry and clean. Use wet toilet paper or moist towelettes after pooping.  Do not sit on the toilet for a long time.  Keep all follow-up visits as told by your doctor. This is important. Contact a doctor if you:  Have pain and swelling that do not get better with treatment or medicine.  Have trouble pooping.  Cannot poop.  Have pain or swelling outside the area of the hemorrhoids. Get help right away if you have:  Bleeding that will not stop. Summary  Hemorrhoids are swollen veins in the butt or around the opening of the butt.  They can cause pain, itching, or bleeding.  Eat foods that have a lot of fiber in them. These include whole grains, beans, nuts, fruits, and vegetables.  Take a warm-water bath (sitz bath) for 20 minutes to ease pain. Do this 3-4 times a day. This information is not intended to replace advice given to you by your health care provider. Make sure you discuss any questions you have with your health care provider. Document Revised: 08/10/2018 Document Reviewed: 12/22/2017 Elsevier Patient Education  2021  [employer].   Patient verbalizes understanding of instructions provided today.   Telephone follow up appointment with Managed Medicaid care management team member scheduled for:03/23/21 @ 2:30pm  Lurena Joiner RN, BSN [place] RN Care Coordinator   Following is a copy of your plan of care:  Patient Care Plan: Cardiovascular Disease Management (DVT/PE)    Problem Identified: Recent DVT/PE     Long-Range Goal: Cardiovascular Management Plan Developed (DVT/PE)   Start Date: 07/01/2020  Expected End Date: 03/23/2021  This Visit's Progress: On track  Recent Progress: On track  Priority: High  Note:      Current Barriers:  . Chronic Disease Management support and education needs related to recent blood clots and anemia. Sherry Anderson continues to take lovenox injections and needs follow up to discuss length of treatment. She reports recent financial change and would like assistance for food resources. Patient expressed need for vision exam, feels that her vision is  getting worse and she would like dental care to fix her teeth. Patient had hematology consult-no longer taking lovenox, now taking Eliquis. Colonoscopy scheduled for 01/14/21. Patient did not attend counseling-missed appointment. Last BP 117/72.-Update-Sherry Anderson has a consult with Urogynecology on 7/26 for bowel and bladder urgency and incontinence. She had a colonoscopy on 6/1, internal and external hemorrhoids noted, otherwise normal. She has had a sore throat, cough and fever the last few days, but feels like she is getting better today. Continues to want to travel to [place] to visit her brother and will plan a trip after her appointment on 7/26.  Nurse Case Manager Clinical Goal(s):  . patient will attend all scheduled medical appointments-Met . patient will work with CM clinical social worker to discuss feelings of depression-Met . patient will demonstrate ongoing self health care management  ability as evidenced by taking all medications as prescribed, attending scheduled appointments and notifying PCP with any new concerns/symptoms . patient will schedule appointment with PCP and work with BSW for therapy/counseling options-scheduled, but missed appointment-would like to plan after her visit to [place] Interventions:  . Inter-disciplinary care team collaboration (see longitudinal plan of care) . Provided education to patient re: iron rich diet; discussed iron rich foods and ways to incorporate these choices into her diet . Reviewed medications with patient and discussed continuing to take medications as prescribed and the importance. Explained that it is normal to have bruising at the injection site with lovenox. Advised to alternate injection sites. Discussed Dr. Assunta Curtis recommendation for taking probiotics. Marland Kitchen Collaborated with LCSW regarding patient having feelings of depression-Patient missed follow up calls with LCSW and BSW. She is interested in establishing appointment for therapy. . Discussed plans with patient for ongoing care management follow up and provided patient with direct contact information for care management team . Reviewed upcoming appointments: 6/23 with PCP and 7/26 with Urogynecology . Collaborated with MM Pharmacist for medication review and assistance with 90 day supply of medications and delivery . Encouraged Sherry Anderson to plan a trip to visit with her family . Provided therapeutic listening . Discussed avoiding constipation and straining with bowel movements. Durward Fortes to notify PCP if symptoms worsen Patient Goals/Self-Care Activities -Self administers medications as prescribed -Attends all scheduled provider appointments -Calls pharmacy for medication refills -Performs ADL's independently -Performs IADL's independently -Calls PCP for new concerns or question -schedule appointment with PCP  -arrange vision and dental exam Follow Up Plan: Telephone  follow up appointment with Managed Medicaid care management team member scheduled for: 03/23/21 @ 2:30pm     Patient Care Plan: Social Work Plan for Depression (Adult)    Problem Identified: Symptoms (Depression)     Long-Range Goal: Symptoms Monitored and Managed   Start Date: 07/22/2020  Expected End Date: 09/20/2020  Recent Progress: On track  Priority: High  Note:   Current Barriers:  . Chronic Mental Health needs related to depression. . Social Isolation . Lacks knowledge of community resource: Patient was given a list by her PCP of community health providers but she does not know where to start with identifying the appropriate provider.  . Suicidal Ideation/Homicidal Ideation: No  Clinical Social Work Goal(s):  Marland Kitchen Over the next 60 days, patient will work with SW monthly by telephone or in person to reduce or manage symptoms related to depression. . Over the next 60 days, patient will work with BSW to address concerns related to scheduling appointments for therapy and medication management.  Interventions: . Patient interviewed and appropriate  assessments performed: PHQ 2 and PHQ 9 . Patient interviewed and appropriate assessments performed . Provided mental health counseling with regard to depression and the effects of trauma   . Provided patient with information about the connection between mental health and physical health. [name] with RN Case Manager re: patient's mental health needs. [name] with BSW re: scheduling patient for therapy at [facility] [phone], [phone]. Marland Kitchen Emotional/Supportive Counseling . Referred to psychiatrist . Referred to counselor/psychotherapist . BSW contacted [facility] to schedule patient's therapy. They do offer walk in services throught out the week, patient stated she would prefer an appointment. BSW left a voicemail for a representative to call back to make an  appointment. Marland Kitchen Update 09/17/20: Patient  stated she was still interested in therapy and medication management. BSW scheduled patient for therapy at [facility] on 09/29/20 at 1pm and medication management on 10/31/20 at 9:20am. Both appointments will be virtual.   Patient Self Care Activities:  . Self administers medications as prescribed . Attends all scheduled provider appointments . Calls pharmacy for medication refills . Performs ADL's independently . Performs IADL's independently . Ability for insight . Independent living . Motivation for treatment  Patient Coping Strengths:  . Spirituality . Hopefulness . Self Advocate . Able to Communicate Effectively  Patient Self Care Deficits:  . Lacks social connections  Initial goal documentation    Long-Range Goal: Track and Manage My Symptoms-Depression   Start Date: 07/22/2020  Expected End Date: 09/20/2020  This Visit's Progress: On track  Priority: High  Note:   Timeframe:  Long-Range Goal Priority:  High Start Date:  07/22/2020                           Expected End Date:   02/05/202                    Follow Up Date 08/19/2020 @ 9:00am   - exercise at least 2 to 3 times per week - have a plan for how to handle bad days - watch for early signs of feeling worse    Why is this important?    Keeping track of your progress will help your treatment team find the right mix of medicine and therapy for you.   Write in your journal every day.   Day-to-day changes in depression symptoms are normal. It may be more helpful to check your progress at the end of each week instead of every day.     Notes:

## 2021-01-21 IMAGING — CT CT ANGIO CHEST
2 of 7 series · 18 of 46 positions shown · IV contrast (omnipaque)
Comparison: None.

CLINICAL DATA: Tachycardia and dyspnea

EXAM:
CT ANGIOGRAPHY CHEST WITH CONTRAST
TECHNIQUE: Multidetector CT imaging of the chest was performed using the
standard protocol during bolus administration of intravenous
contrast. Multiplanar CT image reconstructions and MIPs were
obtained to evaluate the vascular anatomy.
CONTRAST:  60mL OMNIPAQUE IOHEXOL 350 MG/ML SOLN

[Series 7: thins · axial · 0.57mm/px · z∈[+1011,+1218]mm · 15 of 335 slices shown]
[im 19/335  lung]
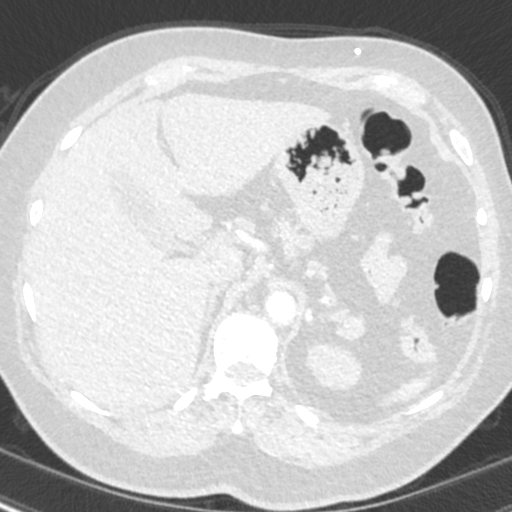
[im 38/335  soft-tissue]
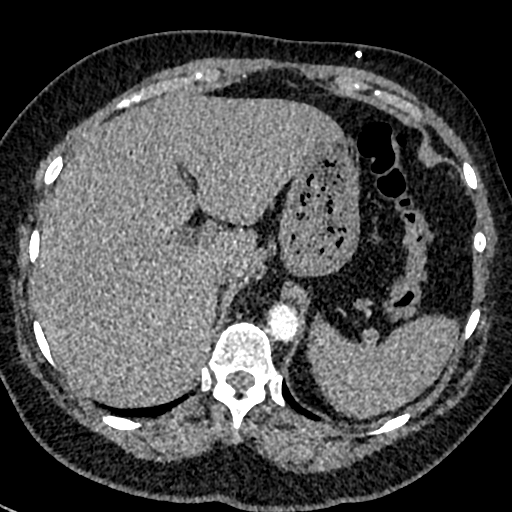
[im 56/335  lung]
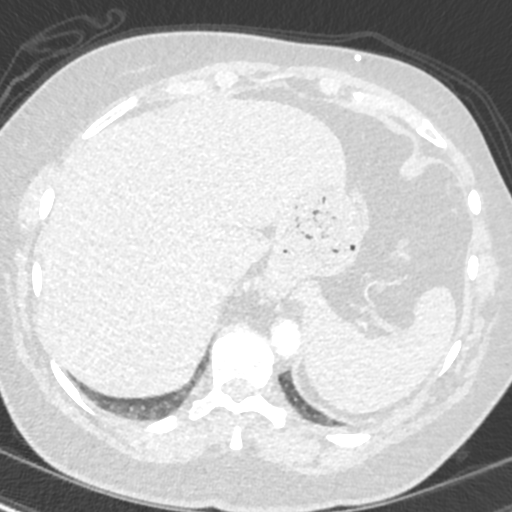
[im 75/335  soft-tissue]
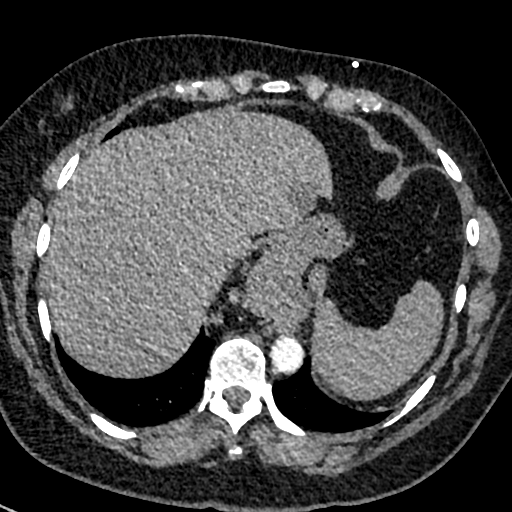
[im 112/335  lung]
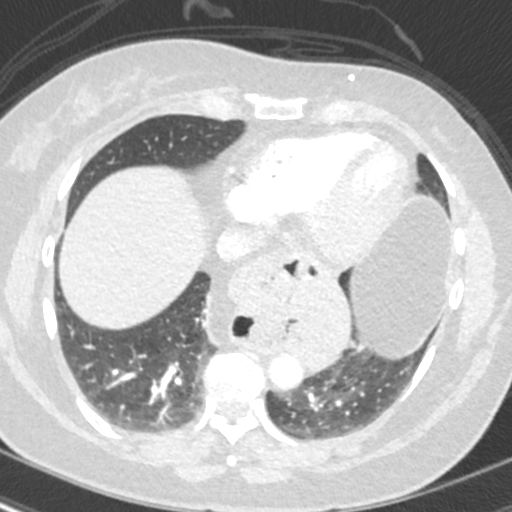
[im 130/335  soft-tissue]
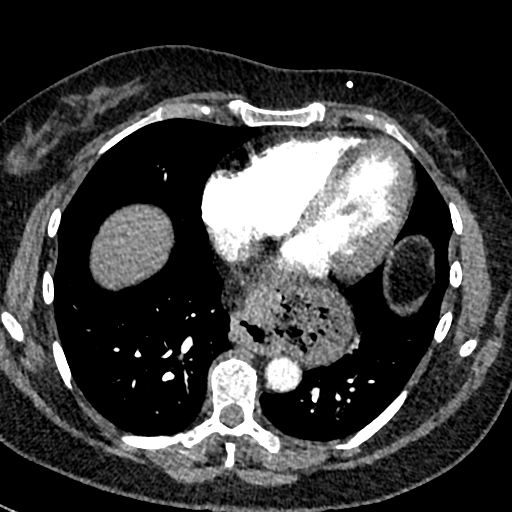
[im 149/335  lung]
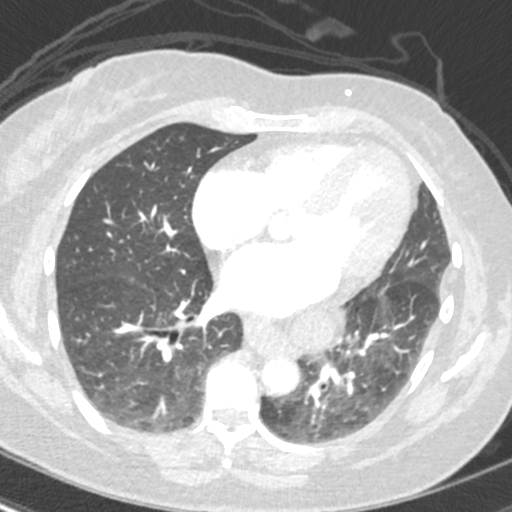
[im 168/335  soft-tissue]
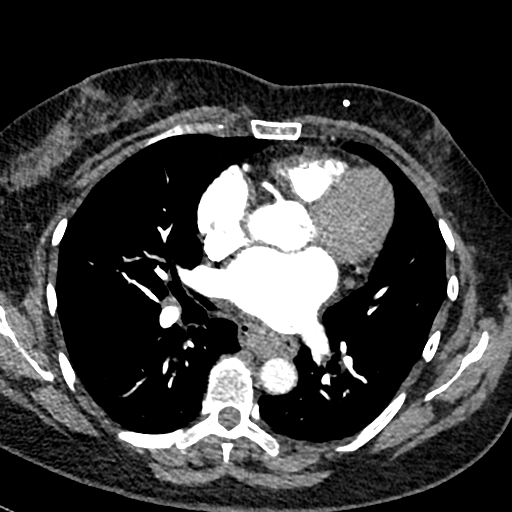
[im 186/335  lung]
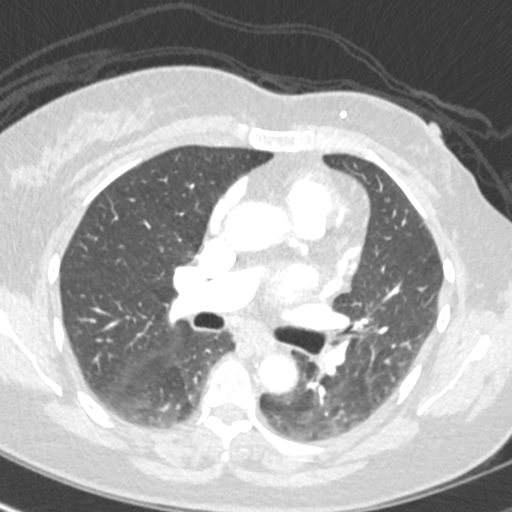
[im 205/335  soft-tissue]
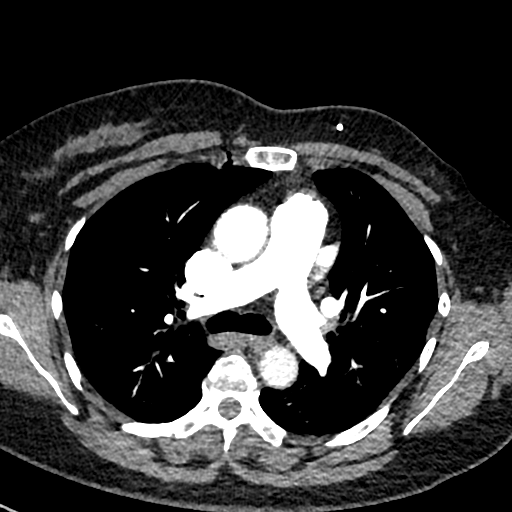
[im 223/335  lung]
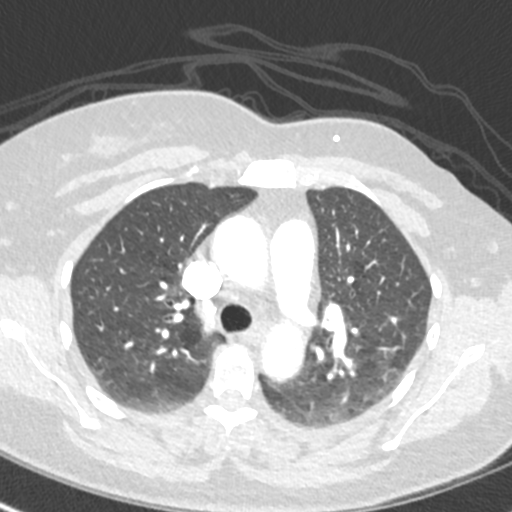
[im 260/335  soft-tissue]
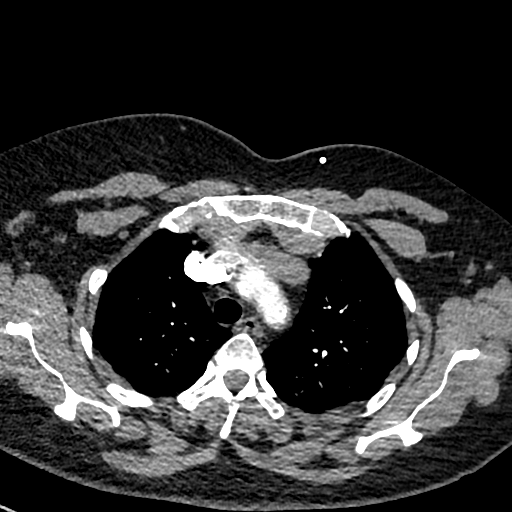
[im 279/335  lung]
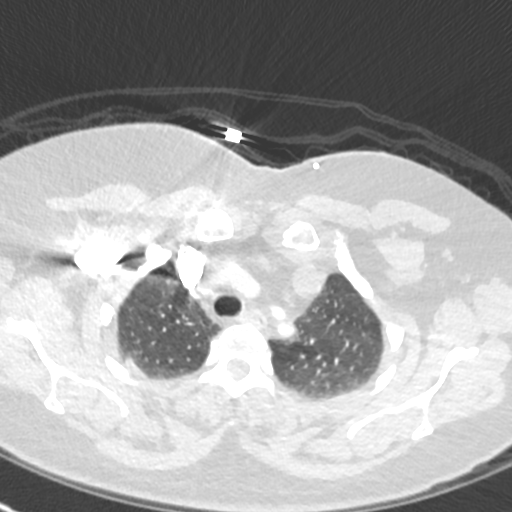
[im 297/335  soft-tissue]
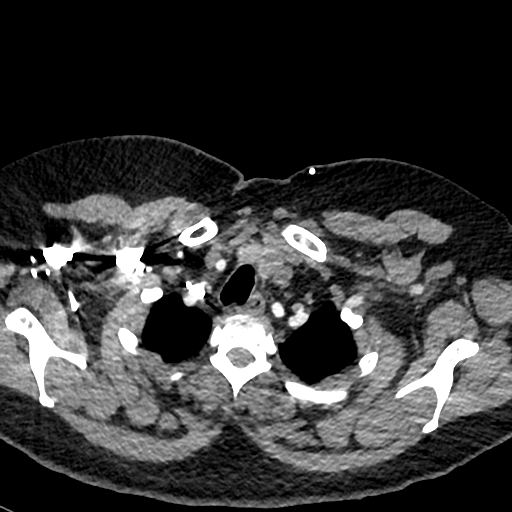
[im 316/335  lung]
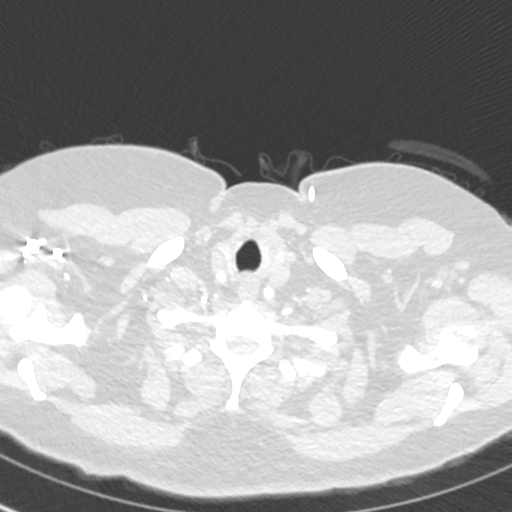

[Series 8: cor · coronal · 0.45mm/px · 3 of 124 slices shown]
[im 31/124  soft-tissue]
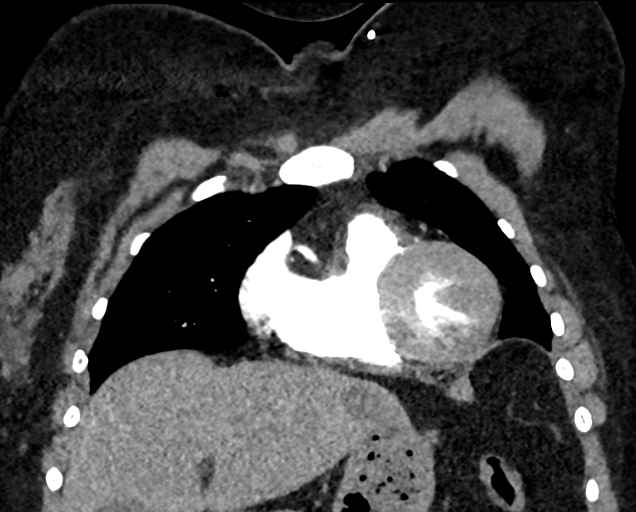
[im 62/124  soft-tissue]
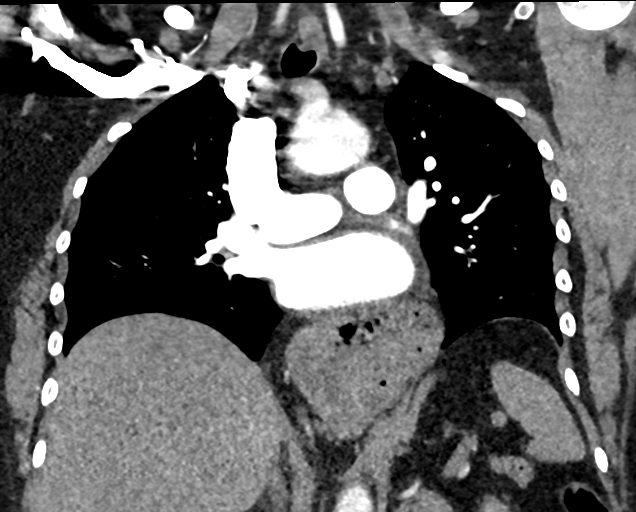
[im 93/124  soft-tissue]
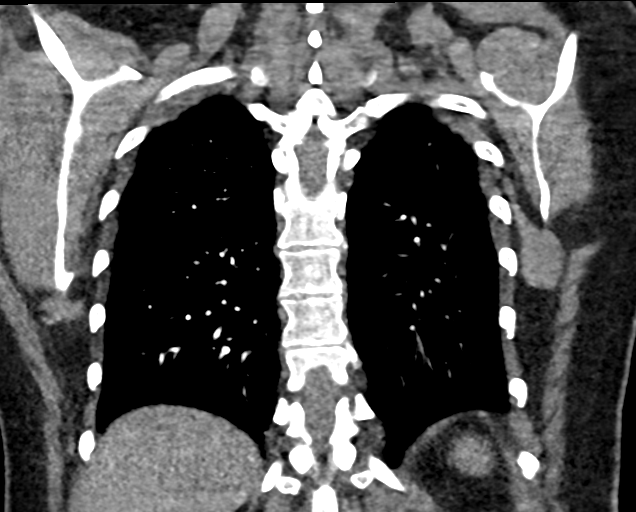

[18 of 46 positions shown; findings below may reference images not displayed]

FINDINGS: Cardiovascular: Thoracic aorta shows no aneurysmal dilatation or
dissection. No cardiac enlargement is seen. No coronary
calcifications are noted. The pulmonary artery is well visualized
within normal branching pattern. Tiny filling defects are noted in
the right main pulmonary artery as well as the lower lobe pulmonary
arterial branches consistent with pulmonary emboli. No right heart
strain is seen.

Mediastinum/Nodes: Thoracic inlet is within normal limits. No hilar
or mediastinal adenopathy is noted. Moderate to large hiatal hernia
is noted with approximately 50% of the stomach within the chest
cavity.

Lungs/Pleura: The lungs are well aerated bilaterally. No focal
infiltrate or sizable effusion is noted. Mild dependent atelectatic
changes are seen. No sizable parenchymal nodule is noted.

Upper Abdomen: Visualized upper abdomen is within normal limits
aside from the previously described hiatal hernia.

Musculoskeletal: No chest wall abnormality. No acute or significant
osseous findings.

Review of the MIP images confirms the above findings.
IMPRESSION: Tiny right-sided pulmonary emboli without right heart strain.

Moderate to large hiatal hernia.

## 2021-01-22 ENCOUNTER — Other Ambulatory Visit: Payer: Self-pay

## 2021-01-22 NOTE — Patient Instructions (Signed)
Visit Information  Ms. Knarr was given information about Medicaid Managed Care team care coordination services as a part of their [place] Complete Medicaid benefit. [name] verbally consented to engagement with the [facility] Managed Care team.   For questions related to your [facility] Complete Medicaid health plan, please call: [phone]  If you would like to schedule transportation through your [place] Complete Medicaid plan, please call the following number at least 2 days in advance of your appointment: [phone].   Call the [place] at [phone], at any time, 24 hours a day, 7 days a week. If you are in danger or need immediate medical attention call 911.  Ms. Thier - following are the goals we discussed in your visit today:   Goals Addressed   None     Please see education materials related to HTN provided as print materials.   Patient verbalizes understanding of instructions provided today.   The Managed Medicaid care management team will reach out to the patient again over the next 90 days.   [place] Constable, Pharm.D., Managed Medicaid Pharmacist [phone]   Following is a copy of your plan of care:

## 2021-01-22 NOTE — Patient Outreach (Signed)
Medicaid Managed Care    Pharmacy Note  01/22/2021 Name: Sherry Anderson MRN: [id number] DOB: February 19, 1961  Sherry Anderson is a 60 y.o. year old female who is a primary care patient of Sherry Anderson, Sherry Limber, MD. The [facility] Managed Care Coordination team was consulted for assistance with disease management and care coordination needs.    Engaged with patient Engaged with patient by telephone for initial visit in response to referral for case management and/or care coordination services.  [name] was given information about Managed Medicaid Care Coordination team services today. Sherry Anderson agreed to services and verbal consent obtained.   Objective:  Lab Results  Component Value Date   CREATININE 1.00 08/21/2020   CREATININE 1.07 (H) 06/30/2020   CREATININE 0.94 05/26/2020    Lab Results  Component Value Date   HGBA1C 5.6 04/23/2020       Component Value Date/Time   CHOL 266 (H) 08/23/2019 1104   TRIG 181 (H) 08/23/2019 1104   HDL 65 08/23/2019 1104   CHOLHDL 4.1 08/23/2019 1104   CHOLHDL 3.6 07/27/2016 1001   VLDL 24 07/27/2016 1001   LDLCALC 168 (H) 08/23/2019 1104   LDLDIRECT 157 (H) 08/20/2008 2054    Other: (TSH, CBC, Vit D, etc.)  Clinical ASCVD: No  The 10-year ASCVD risk score Mikey Bussing DC Jr., et al., 2013) is: 3%   Values used to calculate the score:     Age: 16 years     Sex: Female     Is Non-Hispanic African American: No     Diabetic: No     Tobacco smoker: No     Systolic Blood Pressure: 99 mmHg     Is BP treated: Yes     HDL Cholesterol: 65 mg/dL     Total Cholesterol: 266 mg/dL    Other: (CHADS2VASc if Afib, PHQ9 if depression, MMRC or CAT for COPD, ACT, DEXA)  BP Readings from Last 3 Encounters:  01/14/21 99/60  12/11/20 122/72  10/14/20 117/72    Assessment/Interventions: Review of patient past medical history, allergies, medications, health status, including review of consultants reports, laboratory and other test data, was  performed as part of comprehensive evaluation and provision of chronic care management services.   Medications: -Unable to go through medications with patient. She was very open and spoke to me at length about the frustrations associated with her extensive medical history. The one thing she mentioned from the present is her annoyance at having to go to the pharmacy, she'd prefer an option with delivery Plan: Verbal consent obtained for [facility] enhanced pharmacy services (medication synchronization, adherence packaging, delivery coordination). A medication sync plan was created to allow patient to get all medications delivered once every 30 to 90 days per patient preference. Patient understands they have freedom to choose pharmacy and clinical pharmacist will coordinate care between all prescribers and [facility].   SDOH (Social Determinants of Health) assessments and interventions performed:    Care Plan  No Known Allergies  Medications Reviewed Today     Reviewed by Melissa Montane, RN (Registered Nurse) on 01/19/21 at 1324  Med List Status: <None>   Medication Order Taking? Sig Documenting Provider Last Dose Status Informant  acetaminophen (TYLENOL) 325 MG tablet [id number] Yes Take 650 mg by mouth every 6 (six) hours as needed. OTC PRN [provider] Taking Active   apixaban (ELIQUIS) 5 MG TABS tablet [id number] Yes Take 1 tablet (5 mg total) by mouth 2 (two) times daily. Stop Lovenox and start  ELiquis Brunetta Genera, MD Taking Active   Calcium Citrate-Vitamin D (CALCIUM + D PO) [id number] Yes Take 1 tablet by mouth 2 (two) times daily. [provider] Taking Active Self           Med Note (ROBB, MELANIE A   Thu Jul 31, 2020  1:15 PM) Taking once daily  Cyanocobalamin (B-12 PO) [id number] Yes Take by mouth. [provider] Taking Active            Med Note (ROBB, MELANIE A   Tue Sep 16, 2020  1:14 PM)    ferrous sulfate 325 (65 FE) MG tablet  [id number] Yes Take 325 mg by mouth 2 (two) times daily with a meal. [provider] Taking Active   levothyroxine (SYNTHROID) 75 MCG tablet [id number] Yes TAKE 1 TABLET (75 MCG TOTAL) BY MOUTH EVERY MORNING. [place] Leeanne Rio, MD Taking Active   lisinopril (ZESTRIL) 20 MG tablet [id number] Yes TAKE 1 TABLET BY MOUTH EVERY DAY Leeanne Rio, MD Taking Active   omeprazole (PRILOSEC) 20 MG capsule [id number] Yes Take 1 capsule (20 mg total) by mouth 2 (two) times daily. Leeanne Rio, MD Taking Active   Probiotic CHEW [id number] Yes Chew by mouth. [provider] Taking Active   triamterene-hydrochlorothiazide (DYAZIDE) 37.5-25 MG capsule [id number] Yes TAKE 1 CAPSULE BY MOUTH EVERY DAY Leeanne Rio, MD Taking Active   venlafaxine XR (EFFEXOR-XR) 75 MG 24 hr capsule [id number] Yes TAKE 3 CAPSULES BY MOUTH EVERY DAY Martyn Malay, MD Taking Active             Patient Active Problem List   Diagnosis Date Noted   Alteration in bowel and bladder function 12/11/2020   Pulmonary embolism ([place]) 06/04/2020   Dvt femoral (deep venous thrombosis) ([place]) 04/23/2020   Syncope 09/24/2019   Right trigeminal neuralgia 09/13/2019   GERD (gastroesophageal reflux disease) 09/28/2018   Dyspnea 09/28/2018   Dental caries 06/21/2018   Radiculopathy 04/13/2018   Subclinical hypothyroidism 11/18/2017   Atypical cervical glandular cells 04/21/2017   Right-sided face pain 03/02/2017   Tremor of right hand 11/07/2015   Right-sided sensorineural hearing loss 11/13/2014   DDD (degenerative disc disease), lumbar 10/21/2014   Knee pain, bilateral 11/06/2013   Left wrist pain 03/02/2013   Anemia 01/25/2012   Right ear pain 01/19/2012   HEADACHE 02/04/2010   Hyperlipidemia 08/07/2009   HEARING LOSS, RIGHT EAR 01/17/2008   CARPAL TUNNEL SYNDROME, BILATERAL 07/31/2007   LATERAL EPICONDYLITIS, BILATERAL 07/18/2007   Fatigue due to depression 04/06/2007    Gastritis and gastroduodenitis 11/21/2006   Mood disorder ([place]) 10/17/2006   HYPERTENSION, BENIGN ESSENTIAL 10/17/2006   CA IN SITU, BREAST 02/15/2001    Conditions to be addressed/monitored: HTN  Care Plan : Medication Management  Updates made by Lane Hacker, [place] since 01/22/2021 12:00 AM     Problem: Health Promotion or Disease Self-Management (General Plan of Care)      Goal: Medication Management   Note:   Current Barriers:  Does not maintain contact with provider office Has trouble getting meds from Pharmacy    Pharmacist Clinical Goal(s):  Over the next 90 days, patient will contact provider office for questions/concerns as evidenced notation of same in electronic health record through collaboration with PharmD and provider.    Interventions: Inter-disciplinary care team collaboration (see longitudinal plan of care) Comprehensive medication review performed; medication list updated in electronic medical record   Patient Goals/Self-Care Activities Over the  next 90 days, patient will:  - take medications as prescribed collaborate with provider on medication access solutions  Follow Up Plan: The care management team will reach out to the patient again over the next 90 days.      Task: Mutually Develop and Royce Macadamia Achievement of Patient Goals   Note:   Care Management Activities:    - verbalization of feelings encouraged    Notes:        Medication Assistance:  Would like meds delivered  Follow up: Agree   Plan: The care management team will reach out to the patient again over the next 90 days.   [place] Constable, Pharm.D., Managed Medicaid Pharmacist - [phone]

## 2021-01-23 ENCOUNTER — Telehealth: Payer: Self-pay | Admitting: Family Medicine

## 2021-01-23 DIAGNOSIS — F39 Unspecified mood [affective] disorder: Secondary | ICD-10-CM

## 2021-01-23 MED ORDER — LEVOTHYROXINE SODIUM 75 MCG PO TABS: 75.0000 ug | ORAL_TABLET | ORAL | 3 refills | Status: DC

## 2021-01-23 MED ORDER — OMEPRAZOLE 20 MG PO CPDR: 20.0000 mg | DELAYED_RELEASE_CAPSULE | Freq: Two times a day (BID) | ORAL | 2 refills | Status: DC

## 2021-01-23 MED ORDER — VENLAFAXINE HCL ER 75 MG PO CP24: 225.0000 mg | ORAL_CAPSULE | Freq: Every day | ORAL | 1 refills | Status: DC

## 2021-01-23 MED ORDER — TRIAMTERENE-HCTZ 37.5-25 MG PO CAPS: ORAL_CAPSULE | ORAL | 3 refills | Status: DC

## 2021-01-23 MED ORDER — LISINOPRIL 20 MG PO TABS: 1.0000 | ORAL_TABLET | Freq: Every day | ORAL | 3 refills | Status: DC

## 2021-01-23 NOTE — Telephone Encounter (Signed)
Will send in all medications as requested except for eliquis, as patient will be done with that medication this month.  Leeanne Rio, MD

## 2021-01-23 NOTE — Telephone Encounter (Signed)
-----   Message from Lane Hacker, [facility] sent at 01/22/2021  9:16 AM EDT ----- Regarding: [place] Morning! I just got off the phone with this lovely woman and she was hoping to get her meds delivered for her.  Could you please send new scripts as #90# day fills to a new Pharmacy please?  [facility]  The meds she takes daily are  Apixaban 5mg  BID Levothyroxine 86mcg QD Lisinopril 20mg  QD Triamterene-HCTZ 37.5-25 QD Venlafaxine XR 75mg  QD Omeprazole 20mg  PRN BID   Thanks so much!!

## 2021-02-05 ENCOUNTER — Encounter: Payer: Self-pay | Admitting: Family Medicine

## 2021-02-05 ENCOUNTER — Other Ambulatory Visit: Payer: Self-pay

## 2021-02-05 ENCOUNTER — Ambulatory Visit (INDEPENDENT_AMBULATORY_CARE_PROVIDER_SITE_OTHER): Payer: Medicare HMO | Admitting: Family Medicine

## 2021-02-05 VITALS — BP 120/64 | HR 76 | Ht 63.0 in | Wt 168.4 lb

## 2021-02-05 DIAGNOSIS — I10 Essential (primary) hypertension: Secondary | ICD-10-CM

## 2021-02-05 NOTE — Progress Notes (Signed)
    Date of Visit: 02/05/2021   SUBJECTIVE:   HPI:  Icelynn presents today for routine follow up.  Depression - taking venlafaxine 225mg  daily. Tolerating well and thinks mood is overall good. Got a kitten named Kennyth Lose who has been great for her mood. No SI/HI.  GERD - taking omeprazole 20mg  twice daily along with tums as needed. Symptoms overall well controlled.  Hypertension - currently taking lisinopril 20mg  daily, triamterene-HCTZ 37.5-25mg  daily. Tolerating well.   History of DVT/PE - due to complete eliquis this month once she finishes out current bottle.  OBJECTIVE:   BP 120/64   Pulse 76   Ht 5\' 3"  (1.6 m)   Wt 168 lb 6.4 oz (76.4 kg)   LMP  (LMP Unknown)   SpO2 96%   BMI 29.83 kg/m  Gen: no acute distress pleasant cooperative HEENT: normocephalic, atraumatic Lungs: normal work of breathing  Neuro: alert, speech normal, grossly nonfocal Ext: No appreciable lower extremity edema bilaterally  Psych: normal range of affect, well groomed, speech normal in rate and volume, normal eye contact   ASSESSMENT/PLAN:   Hypertension Well controlled, continue current medications. Updated CMET & lipid panel today  Depression Well controlled. Continue current medication regimen.   GERD Well controlled. Continue current medication regimen.   DVT/PE Advised to stop eliquis after she completes this month's bottle, per her visit with hematology.   FOLLOW UP: Follow up in 3 mos for routine medical problems  [place] J. Ardelia Mems, [place]

## 2021-02-05 NOTE — Patient Instructions (Signed)
Stop eliquis when this bottle runs out  Stay on current medications  Get second dose of shingles vaccine at your pharmacy  Checking kidneys and cholesterol today  Follow up with me in 3 months, sooner if needed  Be well, Dr. Ardelia Mems

## 2021-02-06 LAB — BASIC METABOLIC PANEL
BUN/Creatinine Ratio: 24 (ref 12–28)
BUN: 21 mg/dL (ref 8–27)
CO2: 20 mmol/L (ref 20–29)
Calcium: 9.7 mg/dL (ref 8.7–10.3)
Chloride: 102 mmol/L (ref 96–106)
Creatinine, Ser: 0.88 mg/dL (ref 0.57–1.00)
Glucose: 99 mg/dL (ref 65–99)
Potassium: 4 mmol/L (ref 3.5–5.2)
Sodium: 138 mmol/L (ref 134–144)
eGFR: 75 mL/min/{1.73_m2} (ref >59)

## 2021-02-06 LAB — LIPID PANEL
Chol/HDL Ratio: 3.3 ratio (ref 0.0–4.4)
Cholesterol, Total: 199 mg/dL (ref 100–199)
HDL: 61 mg/dL (ref >39)
LDL Chol Calc (NIH): 115 mg/dL — ABNORMAL HIGH (ref 0–99)
Triglycerides: 130 mg/dL (ref 0–149)
VLDL Cholesterol Cal: 23 mg/dL (ref 5–40)

## 2021-03-03 ENCOUNTER — Telehealth: Payer: Self-pay

## 2021-03-03 NOTE — Telephone Encounter (Signed)
Received fax from pharmacy, PA needed on Omeprazole.  Clinical questions submitted via Cover My Meds.  Waiting on response, could take up to 72 hours.  Cover My Meds info: Key: [id number]  Med approved from  09/16/2020 - 08/15/2021  Called pharmacy with approval.   Talbot Grumbling, RN

## 2021-03-06 ENCOUNTER — Telehealth: Payer: Self-pay

## 2021-03-06 NOTE — Telephone Encounter (Signed)
Attempt made to contact Velvie Groen is a 60 y.o. female re: New pt Pre appt call to collect history information.  -Allergy -Medication -Confirm pharmacy -OB history   Pt was not available. LM on the VM for the patient to call back

## 2021-03-10 ENCOUNTER — Ambulatory Visit: Payer: Medicare HMO | Admitting: Obstetrics and Gynecology

## 2021-03-10 NOTE — Progress Notes (Deleted)
[facility] New Patient Evaluation and Consultation  Referring Provider: Leeanne Rio, MD PCP: Leeanne Rio, MD Date of Service: 03/10/2021  SUBJECTIVE Chief Complaint: No chief complaint on file.  History of Present Illness: Sherry Anderson is a 60 y.o. White or Caucasian female seen in consultation at the request of Dr. Ardelia Mems for evaluation of incontinence.    Review of records from Dr Ardelia Mems significant for: Bowel and bladder urgency with occasional incontinence.   Urinary Symptoms: {urine leakage?:24754} Leaks *** time(s) per {days/wks/mos/yrs:310907}.  Pad use: {NUMBERS 1-10:18281} {pad option:24752} per day.   She {ACTION; IS/IS VG:[id number] bothered by her UI symptoms.  Day time voids ***.  Nocturia: *** times per night to void. Voiding dysfunction: she {empties:24755} her bladder well.  {DOES NOT does:27190} use a catheter to empty bladder.  When urinating, she feels {urine symptoms:24756} Drinks: *** per day  UTIs: {NUMBERS 1-10:18281} UTI's in the last year.   {ACTIONS;DENIES/REPORTS:21021675} history of {urologic concerns:24757}  Pelvic Organ Prolapse Symptoms:                  She {denies/ admits to:24761} a feeling of a bulge the vaginal area. It has been present for {NUMBER 1-10:22536} {days/wks/mos/yrs:310907}.  She {denies/ admits to:24761} seeing a bulge.  This bulge {ACTION; IS/IS VG:[id number] bothersome.  Bowel Symptom: Bowel movements: *** time(s) per {Time; day/week/month:13537} Stool consistency: {stool consistency:24758} Straining: {yes/no:19897}.  Splinting: {yes/no:19897}.  Incomplete evacuation: {yes/no:19897}.  She {denies/ admits to:24761} accidental bowel leakage / fecal incontinence  Occurs: *** time(s) per {Time; day/week/month:13537}  Consistency with leakage: {stool consistency:24758} Bowel regimen: {bowel regimen:24759} Last colonoscopy: Date ***, Results ***  Sexual Function Sexually active:  {yes/no:19897}.  Sexual orientation: {Sexual Orientation:314 195 0205} Pain with sex: {pain with sex:24762}  Pelvic Pain {denies/ admits to:24761} pelvic pain Location: *** Pain occurs: *** Prior pain treatment: *** Improved by: *** Worsened by: ***   Past Medical History:  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   Associated with high-grade dysplasia   Anxiety and depression    Breast cancer ([place]) 2002   left breast cancer    Depression    Diverticulosis of colon    DVT (deep venous thrombosis) ([place]) 2021   provoked by surgery   Essential hypertension, benign    GERD (gastroesophageal reflux disease)    Hepatitis    unsure of which type   Hiatal hernia    High grade squamous intraepithelial cervical dysplasia 05/2017   Associated with adenocarcinoma in situ   History of adenomatous polyp of colon    tubular adenoma's  02-08-2017   History of gastritis    History of left breast cancer 2002---- per pt no recurrence   dx DCIS --- s/p  left breast lumpectomy;  per pt radiation therapy completed same year and completed antiestrogen therapy    History of radiation therapy    2002   left breast   Hypothyroidism    Internal hemorrhoids    Pain    right ear and jaw   Spinal stenosis    Spondylolisthesis    SVD (spontaneous vaginal delivery)    x 1   Tubular adenoma of colon      Past Surgical History:   Past Surgical History:  Procedure Laterality Date   BREAST LUMPECTOMY Left 2002   ductal CA in situ   CERVICAL CONIZATION W/BX N/A 06/08/2017   Procedure: CONIZATION CERVIX WITH BIOPSY;  Surgeon: Anastasio Auerbach, MD;  Location: [place];  Service: Gynecology;  Laterality: N/A;   CESAREAN SECTION  1990   twins   COLONOSCOPY  02-08-2017   dr Karleen Hampshire   DILATATION & CURETTAGE/HYSTEROSCOPY WITH MYOSURE N/A 06/08/2017   Procedure: DILATATION & CURETTAGE/HYSTEROSCOPY WITH MYOSURE;  Surgeon:  Anastasio Auerbach, MD;  Location: [place] [place];  Service: Gynecology;  Laterality: N/A;   ESOPHAGOGASTRODUODENOSCOPY  last one 11-24-2016   dr Karleen Hampshire   ROBOTIC ASSISTED TOTAL HYSTERECTOMY WITH BILATERAL SALPINGO OOPHERECTOMY N/A 09/21/2017   Procedure: XI ROBOTIC ASSISTED TOTAL HYSTERECTOMY WITH BILATERAL SALPINGO OOPHORECTOMY, Lysis of Adhesions;  Surgeon: Princess Bruins, MD;  Location: [facility];  Service: Gynecology;  Laterality: N/A;   TOOTH EXTRACTION     TRANSFORAMINAL LUMBAR INTERBODY FUSION (TLIF) WITH PEDICLE SCREW FIXATION 2 LEVEL N/A 04/13/2018   Procedure: RIGHT-SIDED LUMBAR 4-5 AND LUMBAR 5-SACRUM 1 TRANSFORAMINAL LUMBAR INTERBODY FUSION WITH INSTRUMENTATION AND ALLOGRAFT;  Surgeon: Phylliss Bob, MD;  Location: [place];  Service: Orthopedics;  Laterality: N/A;   WISDOM TOOTH EXTRACTION       Past OB/GYN History: G{NUMBERS 1-10:18281} P{NUMBERS 1-10:18281} Vaginal deliveries: ***,  Forceps/ Vacuum deliveries: ***, Cesarean section: *** Menopausal: {menopausal:24763} Contraception: ***. Last pap smear was ***.  Any history of abnormal pap smears: {yes/no:19897}.   Medications: She has a current medication list which includes the following prescription(s): acetaminophen, apixaban, calcium citrate-vitamin d, cyanocobalamin, ferrous sulfate, levothyroxine, lisinopril, omeprazole, probiotic, triamterene-hydrochlorothiazide, and venlafaxine xr.   Allergies: Patient has No Known Allergies.   Social History:  Social History   Tobacco Use   Smoking status: Never   Smokeless tobacco: Never  Vaping Use   Vaping Use: Never used  Substance Use Topics   Alcohol use: No   Drug use: No    Relationship status: {relationship status:24764} She lives with ***.   She {ACTION; IS/IS GI:087931 employed ***. Regular exercise: {Yes/No:304960894} History of abuse: {Yes/No:304960894}  Family History:   Family History  Problem Relation Age of Onset   Diabetes Mother     Hypertension Mother    Heart disease Father        Pacemaker   Thyroid disease Sister    Schizophrenia Brother    Colon cancer Neg Hx    Esophageal cancer Neg Hx    Rectal cancer Neg Hx    Stomach cancer Neg Hx    Breast cancer Neg Hx    Pancreatic cancer Neg Hx      Review of Systems: ROS   OBJECTIVE Physical Exam: There were no vitals filed for this visit.  Physical Exam   GU / Detailed Urogynecologic Evaluation:  Pelvic Exam: Normal external female genitalia; Bartholin's and Skene's glands normal in appearance; urethral meatus normal in appearance, no urethral masses or discharge.   CST: {gen negative/positive:315881}  Reflexes: bulbocavernosis {DESC; PRESENT/NOT PRESENT:21021351}, anocutaneous {DESC; PRESENT/NOT PRESENT:21021351} ***bilaterally.  Speculum exam reveals normal vaginal mucosa {With/Without:20273} atrophy. Cervix {exam; gyn cervix:30847}. Uterus {exam; pelvic uterus:30849}. Adnexa {exam; adnexa:12223}.    s/p hysterectomy: Speculum exam reveals normal vaginal mucosa {With/Without:20273}  atrophy and normal vaginal cuff.  Adnexa {exam; adnexa:12223}.    With apex supported, anterior compartment defect was {reduced:24765}  Pelvic floor strength {Roman # I-V:19040}/V, puborectalis {Roman # I-V:19040}/V external anal sphincter {Roman # I-V:19040}/V  Pelvic floor musculature: Right levator {Tender/Non-tender:20250}, Right obturator {Tender/Non-tender:20250}, Left levator {Tender/Non-tender:20250}, Left obturator {Tender/Non-tender:20250}  POP-Q:   POP-Q  Aa                                               Ba                                                 C                                                Gh                                               Pb                                               tvl                                                Ap                                                Bp                                                 D     Rectal Exam:  Normal sphincter tone, {rectocele:24766} distal rectocele, enterocoele {DESC; PRESENT/NOT PRESENT:21021351}, no rectal masses, {sign of:24767} dyssynergia when asking the patient to bear down.  Post-Void Residual (PVR) by Bladder Scan: In order to evaluate bladder emptying, we discussed obtaining a postvoid residual and she agreed to this procedure.  Procedure: The ultrasound unit was placed on the patient's abdomen in the suprapubic region after the patient had voided. A PVR of *** ml was obtained by bladder scan.  Laboratory Results: '@ENCLABS'$ @   ***I visualized the urine specimen, noting the specimen to be {urine color:24768}  ASSESSMENT AND PLAN Ms. Manthei is a 60 y.o. with: No diagnosis found.    Jaquita Folds, MD   Medical Decision Making:  - Reviewed/ ordered a clinical laboratory test - Reviewed/ ordered a radiologic study - Reviewed/ ordered medicine test - Decision to obtain old records - Discussion of management of or test interpretation with an external physician / other healthcare professional  - Assessment requiring independent historian - Review and summation of prior records - Independent review of image, tracing or specimen

## 2021-03-12 ENCOUNTER — Other Ambulatory Visit: Payer: Self-pay

## 2021-03-12 ENCOUNTER — Ambulatory Visit (INDEPENDENT_AMBULATORY_CARE_PROVIDER_SITE_OTHER): Payer: Medicare HMO | Admitting: Obstetrics and Gynecology

## 2021-03-12 ENCOUNTER — Encounter: Payer: Self-pay | Admitting: Obstetrics and Gynecology

## 2021-03-12 VITALS — BP 107/69 | HR 70 | Ht 62.0 in | Wt 168.0 lb

## 2021-03-12 DIAGNOSIS — M62838 Other muscle spasm: Secondary | ICD-10-CM | POA: Diagnosis not present

## 2021-03-12 DIAGNOSIS — N393 Stress incontinence (female) (male): Secondary | ICD-10-CM | POA: Diagnosis not present

## 2021-03-12 DIAGNOSIS — R350 Frequency of micturition: Secondary | ICD-10-CM | POA: Diagnosis not present

## 2021-03-12 DIAGNOSIS — R3915 Urgency of urination: Secondary | ICD-10-CM | POA: Diagnosis not present

## 2021-03-12 LAB — POCT URINALYSIS DIPSTICK
Appearance: NORMAL
Bilirubin, UA: NEGATIVE
Blood, UA: NEGATIVE
Glucose, UA: NEGATIVE
Ketones, UA: NEGATIVE
Leukocytes, UA: NEGATIVE
Nitrite, UA: NEGATIVE
Protein, UA: POSITIVE — AB
Spec Grav, UA: >=1.03 — AB (ref 1.010–1.025)
Urobilinogen, UA: 0.2 E.U./dL
pH, UA: 6 (ref 5.0–8.0)

## 2021-03-12 NOTE — Progress Notes (Signed)
Urogynecology New Patient Evaluation and Consultation  Referring Provider: Leeanne Rio, MD PCP: Leeanne Rio, MD Date of Service: 03/12/2021  SUBJECTIVE Chief Complaint: New Patient (Initial Visit)  History of Present Illness: Sherry Anderson is a 60 y.o.  hispanic  female seen in consultation at the request of Dr. Ardelia Mems for evaluation of incontinence.    Review of records from Dr Ardelia Mems significant for: Bowel and bladder urgency with incontinence. No visible prolapse.  Urinary Symptoms: Leaks urine with cough/ sneeze, laughing, with movement to the bathroom, and with urgency Has not been leaking recently. SUI is rare Pad use: none She is bothered by her UI symptoms.  Day time voids every few hours.  Nocturia: 2-3 times per night to void. Voiding dysfunction: she empties her bladder well.  does not use a catheter to empty bladder.  When urinating, she feels she has no difficulties Drinks: a lot of water, no caffeine  UTIs:  0  UTI's in the last year.   Denies history of blood in urine and kidney or bladder stones  Pelvic Organ Prolapse Symptoms:                  She Admits to a feeling of a bulge the vaginal area.  She Denies seeing a bulge.  This bulge is bothersome.  Bowel Symptom: Bowel movements: 2-3 time(s) per day Stool consistency: soft  Straining: no.  Splinting: no. 1 Incomplete evacuation: no.  She Denies accidental bowel leakage / fecal incontinence- happened once but not regularly.   Sexual Function Sexually active: no.    Pelvic Pain Admits to pelvic pain Lower abdomen, above pubic bone Pain occurs: when she walks too much Prior pain treatment: none Improved by: laying down Worsened by: standing   Past Medical History:  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   Associated with high-grade dysplasia   Anxiety and depression    Breast cancer ([place]) 2002    left breast cancer    Depression    Diverticulosis of colon    DVT (deep venous thrombosis) ([place]) 2021   provoked by surgery   Essential hypertension, benign    GERD (gastroesophageal reflux disease)    Hepatitis    unsure of which type   Hiatal hernia    High grade squamous intraepithelial cervical dysplasia 05/2017   Associated with adenocarcinoma in situ   History of adenomatous polyp of colon    tubular adenoma's  02-08-2017   History of gastritis    History of left breast cancer 2002---- per pt no recurrence   dx DCIS --- s/p  left breast lumpectomy;  per pt radiation therapy completed same year and completed antiestrogen therapy    History of radiation therapy    2002   left breast   Hypothyroidism    Internal hemorrhoids    Pain    right ear and jaw   Spinal stenosis    Spondylolisthesis    SVD (spontaneous vaginal delivery)    x 1   Tubular adenoma of colon      Past Surgical History:   Past Surgical History:  Procedure Laterality Date   BREAST LUMPECTOMY Left 2002   ductal CA in situ   CERVICAL CONIZATION W/BX N/A 06/08/2017   Procedure: CONIZATION CERVIX WITH BIOPSY;  Surgeon: Anastasio Auerbach, MD;  Location: [facility];  Service: Gynecology;  Laterality: N/A;   [place]  twins   COLONOSCOPY  02-08-2017   dr Karleen [place]   DILATATION & CURETTAGE/HYSTEROSCOPY WITH MYOSURE N/A 06/08/2017   Procedure: DILATATION & CURETTAGE/HYSTEROSCOPY WITH MYOSURE;  Surgeon: Anastasio Auerbach, MD;  Location: [facility];  Service: Gynecology;  Laterality: N/A;   ESOPHAGOGASTRODUODENOSCOPY  last one 11-24-2016   dr Karleen [place]   ROBOTIC ASSISTED TOTAL HYSTERECTOMY WITH BILATERAL SALPINGO OOPHERECTOMY N/A 09/21/2017   Procedure: XI ROBOTIC ASSISTED TOTAL HYSTERECTOMY WITH BILATERAL SALPINGO OOPHORECTOMY, Lysis of Adhesions;  Surgeon: Princess Bruins, MD;  Location: [facility];  Service: Gynecology;  Laterality: N/A;   TOOTH EXTRACTION      TRANSFORAMINAL LUMBAR INTERBODY FUSION (TLIF) WITH PEDICLE SCREW FIXATION 2 LEVEL N/A 04/13/2018   Procedure: RIGHT-SIDED LUMBAR 4-5 AND LUMBAR 5-SACRUM 1 TRANSFORAMINAL LUMBAR INTERBODY FUSION WITH INSTRUMENTATION AND ALLOGRAFT;  Surgeon: Phylliss Bob, MD;  Location: [facility];  Service: Orthopedics;  Laterality: N/A;   WISDOM TOOTH EXTRACTION       Past OB/GYN History: G2 P2 Vaginal deliveries: 1,  Forceps/ Vacuum deliveries: 0, Cesarean section: 1 Menopausal: Yes, Denies vaginal bleeding since menopause S/p hysterectomy  Medications: She has a current medication list which includes the following prescription(s): acetaminophen, calcium citrate-vitamin d, cyanocobalamin, ferrous sulfate, levothyroxine, lisinopril, omeprazole, probiotic, triamterene-hydrochlorothiazide, and venlafaxine xr.   Allergies: Patient has No Known Allergies.   Social History:  Social History   Tobacco Use   Smoking status: Never   Smokeless tobacco: Never  Vaping Use   Vaping Use: Never used  Substance Use Topics   Alcohol use: No   Drug use: No    Relationship status: single She lives with nephew and son.   She is not employed. Regular exercise: Yes: walking   Family History:   Family History  Problem Relation Age of Onset   Diabetes Mother    Hypertension Mother    Heart disease Father        Pacemaker   Thyroid disease Sister    Schizophrenia Brother    Colon cancer Neg Hx    Esophageal cancer Neg Hx    Rectal cancer Neg Hx    Stomach cancer Neg Hx    Breast cancer Neg Hx    Pancreatic cancer Neg Hx      Review of Systems: Review of Systems  Constitutional:  Negative for fever, malaise/fatigue and weight loss.  Respiratory:  Negative for cough, shortness of breath and wheezing.   Cardiovascular:  Negative for chest pain, palpitations and leg swelling.  Gastrointestinal:  Negative for abdominal pain and blood in stool.  Genitourinary:  Negative for dysuria.  Musculoskeletal:   Negative for myalgias.  Skin:  Negative for rash.  Neurological:  Negative for dizziness and headaches.  Endo/Heme/Allergies:  Does not bruise/bleed easily.  Psychiatric/Behavioral:  Positive for depression. The patient is nervous/anxious.     OBJECTIVE Physical Exam: Vitals:   03/12/21 1141  BP: 107/69  Pulse: 70  Weight: 168 lb (76.2 kg)  Height: '5\' 2"'$  (1.575 m)    Physical Exam Constitutional:      General: She is not in acute distress. Pulmonary:     Effort: Pulmonary effort is normal.  Abdominal:     General: There is no distension.     Palpations: Abdomen is soft.     Tenderness: There is no abdominal tenderness. There is no rebound.  Musculoskeletal:        General: No swelling. Normal range of motion.  Skin:    General: Skin is warm and dry.  Findings: No rash.  Neurological:     Mental Status: She is alert and oriented to person, place, and time.  Psychiatric:        Mood and Affect: Mood normal.        Behavior: Behavior normal.     GU / Detailed Urogynecologic Evaluation:  Pelvic Exam: Normal external female genitalia; Bartholin's and Skene's glands normal in appearance; urethral meatus normal in appearance, no urethral masses or discharge.   CST: negative   s/p hysterectomy: Speculum exam reveals normal vaginal mucosa with  atrophy and normal vaginal cuff.  Adnexa no mass, fullness, tenderness.    Pelvic floor strength II/V  Pelvic floor musculature: Right levator tender, Right obturator tender, Left levator tender, Left obturator tender. Palpation reproduces lower abdominal/ pelvic pain  POP-Q:   POP-Q  -2.5                                            Aa   -2.5                                           Ba  -7.5                                              C   2                                            Gh  3                                            Pb  7.5                                            tvl   -3                                             Ap  -3                                            Bp                                                 D     Rectal Exam:  Normal external rectum  Post-Void Residual (PVR) by Bladder Scan: In order to evaluate bladder emptying, we discussed obtaining a postvoid residual and she agreed to this procedure.  Procedure: The ultrasound  unit was placed on the patient's abdomen in the suprapubic region after the patient had voided. A PVR of 13 ml was obtained by bladder scan.  Laboratory Results: POC urine: + protein   ASSESSMENT AND PLAN Sherry Anderson is a 61 y.o. with:  1. Levator spasm   2. Urinary frequency   3. Urinary urgency   4. SUI (stress urinary incontinence, female)    Levator spasm -The origin of pelvic floor muscle spasm can be multifactorial, including primary, reactive to a different pain source, trauma, or even part of a centralized pain syndrome.Treatment options include pelvic floor physical therapy, local (vaginal) or oral  muscle relaxants, pelvic muscle trigger point injections or centrally acting pain medications.   - She would like to start with pelvic floor PT- referral placed  2. Urinary urge/ SUI - pt states that her incontinence symptoms have mostly resolved and she does not want treatment at this time. We discussed that symptoms may be exacerbated by pelvic floor muscle spasm, and also that PT can help with pelvic floor strengthening which will help leakage.   Return 3 months.    Jaquita Folds, MD   Medical Decision Making:  - Reviewed/ ordered a clinical laboratory test - Review and summation of prior records

## 2021-03-23 ENCOUNTER — Other Ambulatory Visit: Payer: Self-pay

## 2021-03-23 ENCOUNTER — Other Ambulatory Visit: Payer: Self-pay | Admitting: *Deleted

## 2021-03-23 NOTE — Patient Instructions (Signed)
Visit Information  Sherry Anderson was given information about Medicaid Managed Care team care coordination services as a part of their [employer] Medicaid benefit. Sherry Anderson verbally consented to engagement with the [facility] Managed Care team.   If you are experiencing a medical emergency, please call 911 or report to your local emergency department or urgent care.   If you have a non-emergency medical problem during routine business hours, please contact your provider's office and ask to speak with a nurse.   For questions related to your Healthy [facility] health plan, please call: [phone] or visit the homepage here: [email or web address]  If you would like to schedule transportation through your Healthy [facility] plan, please call the following number at least 2 days in advance of your appointment: [phone]  Call the [facility] at [phone], at any time, 24 hours a day, 7 days a week. If you are in danger or need immediate medical attention call 911.  If you would like help to quit smoking, call 1-800-QUIT-NOW 470-651-9013) OR Espaol: 1-855-Djelo-Ya (8-366-294-7654) o para Sherry informacin haga clic aqu or Text READY to 200-400 to register via text  Sherry. Danise Mina - following are the goals we discussed in your visit today:   Goals Addressed             This Visit's Progress    COMPLETED: Care Activities for DVT/PE       Follow up-no follow up at this time  Over the next 60 days, patient will:  -Self administers medications as prescribed -Attends all scheduled provider appointments -Calls pharmacy for medication refills -Performs ADL's independently -Performs IADL's independently -Calls PCP for new concerns or question -schedule appointment with PCP  -arrange vision and dental exam               Please see education materials related to DVT provided by MyChart link.  Patient  verbalizes understanding of instructions provided today.   No further follow up required: none  Sherry Joiner RN, BSN [place] RN Care Coordinator   Following is a copy of your plan of care:  Patient Care Plan: Cardiovascular Disease Management (DVT/PE)  Completed 03/23/2021   Problem Identified: Recent DVT/PE Resolved 03/23/2021     Long-Range Goal: Cardiovascular Management Plan Developed (DVT/PE) Completed 03/23/2021  Start Date: 07/01/2020  Expected End Date: 03/23/2021  Recent Progress: On track  Priority: High  Note:      Current Barriers:  Chronic Disease Management support and education needs related to recent blood clots and anemia. Sherry Anderson continues to take lovenox injections and needs follow up to discuss length of treatment. She reports recent financial change and would like assistance for food resources. Patient expressed need for vision exam, feels that her vision is getting worse and she would like dental care to fix her teeth. Patient had hematology consult-no longer taking lovenox, now taking Eliquis. Colonoscopy scheduled for 01/14/21. Patient did not attend counseling-missed appointment. Last BP 117/72. Sherry Anderson has a consult with Urogynecology on 7/28 for bowel and bladder urgency and incontinence. She had a colonoscopy on 6/1, internal and external hemorrhoids noted, otherwise normal.  Continues to want to travel to [place] to visit her brother and will plan a trip after her appointment on 7/28.-Update-Sherry Anderson needs to schedule PT for urinary incontinence. Her son is in [place] working for 3 months and she is so happy.   Nurse Case Manager Clinical Goal(s):  patient will attend all scheduled medical  appointments-Met patient will work with CM clinical social worker to discuss feelings of depression-Met patient will demonstrate ongoing self health care management ability as evidenced by taking all medications as prescribed,  attending scheduled appointments and notifying PCP with any new concerns/symptoms-Met patient will schedule appointment with PCP and work with BSW for therapy/counseling options-Met Interventions:  Inter-disciplinary care team collaboration (see longitudinal plan of care) Provided education to patient re: iron rich diet; discussed iron rich foods and ways to incorporate these choices into her diet Advised patient to call and schedule PT Discussed plans with patient for ongoing care management follow up and provided patient with direct contact information for care management team Reviewed upcoming appointments Provided therapeutic listening Advised to notify PCP if symptoms worsen Patient Goals/Self-Care Activities -Self administers medications as prescribed -Attends all scheduled provider appointments -Calls pharmacy for medication refills -Performs ADL's independently -Performs IADL's independently -Calls PCP for new concerns or question -schedule appointment with PCP  -arrange vision and dental exam Follow Up Plan: Telephone follow up appointment with Managed Medicaid care management team member scheduled for: No follow up indicated      Patient Care Plan: Social Work Plan for Depression (Adult)     Problem Identified: Symptoms (Depression)      Long-Range Goal: Symptoms Monitored and Managed   Start Date: 07/22/2020  Expected End Date: 09/20/2020  Recent Progress: On track  Priority: High  Note:   Current Barriers:  Chronic Mental Health needs related to depression. Social Isolation Lacks knowledge of community resource: Patient was given a list by her PCP of community health providers but she does not know where to start with identifying the appropriate provider.  Suicidal Ideation/Homicidal Ideation: No  Clinical Social Work Goal(s):  Over the next 60 days, patient will work with SW monthly by telephone or in person to reduce or manage symptoms related to depression. Over  the next 60 days, patient will work with BSW to address concerns related to scheduling appointments for therapy and medication management.  Interventions: Patient interviewed and appropriate assessments performed: PHQ 2 and PHQ 9 Patient interviewed and appropriate assessments performed Provided mental health counseling with regard to depression and the effects of trauma   Provided patient with information about the connection between mental health and physical health. Collaborated with RN Case Manager re: patient's mental health needs. Collaborated with BSW re: scheduling patient for therapy at [facility] [phone], [phone]. Emotional/Supportive Counseling Referred to psychiatrist Referred to counselor/psychotherapist BSW contacted [facility] to schedule patient's therapy. They do offer walk in services throught out the week, patient stated she would prefer an appointment. BSW left a voicemail for a representative to call back to make an appointment. Update 09/17/20: Patient  stated she was still interested in therapy and medication management. BSW scheduled patient for therapy at [facility] on 09/29/20 at 1pm and medication management on 10/31/20 at 9:20am. Both appointments will be virtual.   Patient Self Care Activities:  Self administers medications as prescribed Attends all scheduled provider appointments Calls pharmacy for medication refills Performs ADL's independently Performs IADL's independently Ability for insight Independent living Motivation for treatment  Patient Coping Strengths:  Spirituality Hopefulness Self Advocate Able to Communicate Effectively  Patient Self Care Deficits:  Lacks social connections  Initial goal documentation     Long-Range Goal: Track and Manage My Symptoms-Depression   Start Date: 07/22/2020  Expected End Date: 09/20/2020  This Visit's Progress: On track  Priority: High  Note:    Timeframe:  Long-Range Goal Priority:  High Start Date:  07/22/2020                           Expected End Date:   02/05/202                    Follow Up Date 08/19/2020 @ 9:00am   - exercise at least 2 to 3 times per week - have a plan for how to handle bad days - watch for early signs of feeling worse    Why is this important?   Keeping track of your progress will help your treatment team find the right mix of medicine and therapy for you.  Write in your journal every day.  Day-to-day changes in depression symptoms are normal. It may be more helpful to check your progress at the end of each week instead of every day.     Notes:     Patient Care Plan: Medication Management     Problem Identified: Health Promotion or Disease Self-Management (General Plan of Care)      Goal: Medication Management   Note:   Current Barriers:  Does not maintain contact with provider office Has trouble getting meds from Pharmacy    Pharmacist Clinical Goal(s):  Over the next 90 days, patient will contact provider office for questions/concerns as evidenced notation of same in electronic health record through collaboration with PharmD and provider.    Interventions: Inter-disciplinary care team collaboration (see longitudinal plan of care) Comprehensive medication review performed; medication list updated in electronic medical record    Patient Goals/Self-Care Activities Over the next 90 days, patient will:  - take medications as prescribed collaborate with provider on medication access solutions  Follow Up Plan: The care management team will reach out to the patient again over the next 90 days.

## 2021-03-23 NOTE — Patient Outreach (Signed)
Medicaid Managed Care   Nurse Care Manager Note  03/23/2021 Name:  Sherry Anderson MRN:  [id number] DOB:  1961/07/26  Sherry Anderson is an 60 y.o. year old female who is a primary patient of Ardelia Mems, Delorse Limber, MD.  The [facility] Managed Care Coordination team was consulted for assistance with:    DVT  [name] was given information about Medicaid Managed Care Coordination team services today. [name] agreed to services and verbal consent obtained.  Engaged with patient by telephone for follow up visit in response to provider referral for case management and/or care coordination services.   Assessments/Interventions:  Review of past medical history, allergies, medications, health status, including review of consultants reports, laboratory and other test data, was performed as part of comprehensive evaluation and provision of chronic care management services.  SDOH (Social Determinants of Health) assessments and interventions performed:   Care Plan  No Known Allergies  Medications Reviewed Today     Reviewed by Melissa Montane, RN (Registered Nurse) on 03/23/21 at 1446  Med List Status: <None>   Medication Order Taking? Sig Documenting Provider Last Dose Status Informant  acetaminophen (TYLENOL) 325 MG tablet [id number] No Take 650 mg by mouth every 6 (six) hours as needed. OTC PRN [provider] Taking Active   Calcium Citrate-Vitamin D (CALCIUM + D PO) [id number] No Take 1 tablet by mouth 2 (two) times daily. [provider] Taking Active Self           Med Note (Bacilio Abascal A   Thu Jul 31, 2020  1:15 PM) Taking once daily  Cyanocobalamin (B-12 PO) [id number] No Take by mouth. [provider] Taking Active            Med Note (Elvira Langston A   Tue Sep 16, 2020  1:14 PM)    ferrous sulfate 325 (65 FE) MG tablet [id number] No Take 325 mg by mouth 2 (two) times daily with a meal. [provider] Taking Active   levothyroxine  (SYNTHROID) 75 MCG tablet [id number] No Take 1 tablet (75 mcg total) by mouth every morning. 30 minutes before food Leeanne Rio, MD Taking Active   lisinopril (ZESTRIL) 20 MG tablet [id number] No Take 1 tablet (20 mg total) by mouth daily. Leeanne Rio, MD Taking Active   omeprazole (PRILOSEC) 20 MG capsule [id number] No Take 1 capsule (20 mg total) by mouth 2 (two) times daily. Leeanne Rio, MD Taking Active   Probiotic CHEW [id number] No Chew by mouth. [provider] Taking Active   triamterene-hydrochlorothiazide (DYAZIDE) 37.5-25 MG capsule [id number] No TAKE 1 CAPSULE BY MOUTH EVERY DAY Ardelia Mems Delorse Limber, MD Taking Active   venlafaxine XR (EFFEXOR-XR) 75 MG 24 hr capsule [id number] No Take 3 capsules (225 mg total) by mouth daily. Leeanne Rio, MD Taking Active             Patient Active Problem List   Diagnosis Date Noted   Alteration in bowel and bladder function 12/11/2020   Pulmonary embolism ([place]) 06/04/2020   Dvt femoral (deep venous thrombosis) ([place]) 04/23/2020   Syncope 09/24/2019   Right trigeminal neuralgia 09/13/2019   GERD (gastroesophageal reflux disease) 09/28/2018   Dyspnea 09/28/2018   Dental caries 06/21/2018   Radiculopathy 04/13/2018   Subclinical hypothyroidism 11/18/2017   Atypical cervical glandular cells 04/21/2017   Right-sided face pain 03/02/2017   Tremor of right hand 11/07/2015   Right-sided sensorineural hearing loss 11/13/2014   DDD (degenerative disc disease), lumbar 10/21/2014  Knee pain, bilateral 11/06/2013   Left wrist pain 03/02/2013   Anemia 01/25/2012   Right ear pain 01/19/2012   HEADACHE 02/04/2010   Hyperlipidemia 08/07/2009   HEARING LOSS, RIGHT EAR 01/17/2008   CARPAL TUNNEL SYNDROME, BILATERAL 07/31/2007   LATERAL EPICONDYLITIS, BILATERAL 07/18/2007   Fatigue due to depression 04/06/2007   Gastritis and gastroduodenitis 11/21/2006   Mood disorder ([place]) 10/17/2006   HYPERTENSION, BENIGN  ESSENTIAL 10/17/2006   CA IN SITU, BREAST 02/15/2001    Conditions to be addressed/monitored per PCP order:   DVT  Care Plan : Cardiovascular Disease Management (DVT/PE)  Updates made by Melissa Montane, RN since 03/23/2021 12:00 AM  Completed 03/23/2021   Problem: Recent DVT/PE Resolved 03/23/2021     Long-Range Goal: Cardiovascular Management Plan Developed (DVT/PE) Completed 03/23/2021  Start Date: 07/01/2020  Expected End Date: 03/23/2021  Recent Progress: On track  Priority: High  Note:      Current Barriers:  Chronic Disease Management support and education needs related to recent blood clots and anemia. [name] continues to take lovenox injections and needs follow up to discuss length of treatment. She reports recent financial change and would like assistance for food resources. Patient expressed need for vision exam, feels that her vision is getting worse and she would like dental care to fix her teeth. Patient had hematology consult-no longer taking lovenox, now taking Eliquis. Colonoscopy scheduled for 01/14/21. Patient did not attend counseling-missed appointment. Last BP 117/72. [name] has a consult with Urogynecology on 7/28 for bowel and bladder urgency and incontinence. She had a colonoscopy on 6/1, internal and external hemorrhoids noted, otherwise normal.  Continues to want to travel to [place] to visit her brother and will plan a trip after her appointment on 7/28.-Update-[name] needs to schedule PT for urinary incontinence. Her son is in [place] working for 3 months and she is so happy.   Nurse Case Manager Clinical Goal(s):  patient will attend all scheduled medical appointments-Met patient will work with CM clinical social worker to discuss feelings of depression-Met patient will demonstrate ongoing self health care management ability as evidenced by taking all medications as prescribed, attending scheduled appointments and notifying PCP with any new  concerns/symptoms-Met patient will schedule appointment with PCP and work with BSW for therapy/counseling options-Met Interventions:  Inter-disciplinary care team collaboration (see longitudinal plan of care) Provided education to patient re: iron rich diet; discussed iron rich foods and ways to incorporate these choices into her diet Advised patient to call and schedule PT Discussed plans with patient for ongoing care management follow up and provided patient with direct contact information for care management team Reviewed upcoming appointments Provided therapeutic listening Advised to notify PCP if symptoms worsen Patient Goals/Self-Care Activities -Self administers medications as prescribed -Attends all scheduled provider appointments -Calls pharmacy for medication refills -Performs ADL's independently -Performs IADL's independently -Calls PCP for new concerns or question -schedule appointment with PCP  -arrange vision and dental exam Follow Up Plan: Telephone follow up appointment with Managed Medicaid care management team member scheduled for: No follow up indicated       Follow Up:  Patient agrees to Care Plan and Follow-up.  Plan: The patient has been provided with contact information for the Managed Medicaid care management team and has been advised to call with any health related questions or concerns.  Date/time of next scheduled RN care management/care coordination outreach:  No follow up at this time  Lurena Joiner RN, [place]  Energy manager

## 2021-04-30 ENCOUNTER — Other Ambulatory Visit: Payer: Self-pay | Admitting: Family Medicine

## 2021-06-10 NOTE — Progress Notes (Deleted)
[facility] Return Visit  SUBJECTIVE  History of Present Illness: Sherry Anderson is a 60 y.o. female seen in follow-up for levator spasm and SUI. Plan at last visit was referral to pelvic floor PT.     Past Medical History: Patient  has a past medical history of Adenocarcinoma in situ (AIS) of uterine cervix (05/2017), Anxiety and depression, Breast cancer ([place]) (2002), Depression, Diverticulosis of colon, DVT (deep venous thrombosis) ([place]) (2021), Essential hypertension, benign, GERD (gastroesophageal reflux disease), Hepatitis, Hiatal hernia, High grade squamous intraepithelial cervical dysplasia (05/2017), History of adenomatous polyp of colon, History of gastritis, History of left breast cancer (2002---- per pt no recurrence), History of radiation therapy, Hypothyroidism, Internal hemorrhoids, Pain, Spinal stenosis, Spondylolisthesis, SVD (spontaneous vaginal delivery), and Tubular adenoma of colon.   Past Surgical History: She  has a past surgical history that includes Cesarean section (1990); Esophagogastroduodenoscopy (last one 11-24-2016   dr Karleen Hampshire); Colonoscopy (02-08-2017   dr Karleen Hampshire); Cervical conization w/bx (N/A, 06/08/2017); Dilatation & curettage/hysteroscopy with myosure (N/A, 06/08/2017); Breast lumpectomy (Left, 2002); Tooth extraction; Wisdom tooth extraction; Transforaminal lumbar interbody fusion (tlif) with pedicle screw fixation 2 level (N/A, 04/13/2018); and Robotic assisted total hysterectomy with bilateral salpingo oophorectomy (N/A, 09/21/2017).   Medications: She has a current medication list which includes the following prescription(s): acetaminophen, calcium citrate-vitamin d, cyanocobalamin, ferrous sulfate, levothyroxine, lisinopril, omeprazole, probiotic, triamterene-hydrochlorothiazide, and venlafaxine xr.   Allergies: Patient has No Known Allergies.   Social History: Patient  reports that she has never smoked. She has never used smokeless  tobacco. She reports that she does not drink alcohol and does not use drugs.      OBJECTIVE     Physical Exam: There were no vitals filed for this visit. Gen: No apparent distress, A&O x 3.  Detailed Urogynecologic Evaluation:  Deferred. Prior exam showed:  No flowsheet data found.     ASSESSMENT AND PLAN    Sherry Anderson is a 60 y.o. with:  No diagnosis found.

## 2021-06-11 ENCOUNTER — Ambulatory Visit (INDEPENDENT_AMBULATORY_CARE_PROVIDER_SITE_OTHER): Payer: Medicare HMO | Admitting: Family Medicine

## 2021-06-11 ENCOUNTER — Ambulatory Visit (INDEPENDENT_AMBULATORY_CARE_PROVIDER_SITE_OTHER): Payer: Medicare HMO

## 2021-06-11 ENCOUNTER — Encounter: Payer: Self-pay | Admitting: Family Medicine

## 2021-06-11 ENCOUNTER — Other Ambulatory Visit: Payer: Self-pay

## 2021-06-11 VITALS — BP 130/80 | HR 90 | Ht 62.0 in | Wt 165.2 lb

## 2021-06-11 DIAGNOSIS — B001 Herpesviral vesicular dermatitis: Secondary | ICD-10-CM | POA: Diagnosis not present

## 2021-06-11 DIAGNOSIS — Z23 Encounter for immunization: Secondary | ICD-10-CM

## 2021-06-11 DIAGNOSIS — E785 Hyperlipidemia, unspecified: Secondary | ICD-10-CM | POA: Diagnosis not present

## 2021-06-11 DIAGNOSIS — I10 Essential (primary) hypertension: Secondary | ICD-10-CM | POA: Diagnosis not present

## 2021-06-11 DIAGNOSIS — F39 Unspecified mood [affective] disorder: Secondary | ICD-10-CM | POA: Diagnosis not present

## 2021-06-11 DIAGNOSIS — R69 Illness, unspecified: Secondary | ICD-10-CM | POA: Diagnosis not present

## 2021-06-11 DIAGNOSIS — E038 Other specified hypothyroidism: Secondary | ICD-10-CM

## 2021-06-11 MED ORDER — LISINOPRIL 20 MG PO TABS: 20.0000 mg | ORAL_TABLET | Freq: Every day | ORAL | 3 refills | Status: DC

## 2021-06-11 MED ORDER — VENLAFAXINE HCL ER 75 MG PO CP24: 225.0000 mg | ORAL_CAPSULE | Freq: Every day | ORAL | 1 refills | Status: DC

## 2021-06-11 MED ORDER — LEVOTHYROXINE SODIUM 75 MCG PO TABS: 75.0000 ug | ORAL_TABLET | ORAL | 1 refills | Status: DC

## 2021-06-11 MED ORDER — TRIAMTERENE-HCTZ 37.5-25 MG PO CAPS: ORAL_CAPSULE | ORAL | 3 refills | Status: DC

## 2021-06-11 MED ORDER — VALACYCLOVIR HCL 1 G PO TABS: 2000.0000 mg | ORAL_TABLET | Freq: Two times a day (BID) | ORAL | 3 refills | Status: AC

## 2021-06-11 NOTE — Assessment & Plan Note (Addendum)
Patient's lesions on her lips are likely cold sores given their appearance and distribution. Will prescribe Valtrex for treatment.

## 2021-06-11 NOTE — Assessment & Plan Note (Addendum)
Patient is tolerating her omeprazole well. Continue current medication regimen given good control of symptoms. Refills sent.

## 2021-06-11 NOTE — Progress Notes (Addendum)
error 

## 2021-06-11 NOTE — Patient Instructions (Signed)
Great to see you again  Refilled medications  COVID booster and flu shot today  Sent in valtrex for you to take when you have a cold sore Take 2 pills in AM, 2 pills in PM for one day at first sign of cold sore.  Follow up with me in March or April, sooner if needed  Good luck on your trip!  Be well, Dr. Markus Jarvis Sore A cold sore, also called a fever blister, is a small, fluid-filled sore that forms inside the mouth or on the lips, gums, nose, chin, or cheeks. Cold sores can spread to other parts of the body, such as the eyes or fingers. In some people who have other medical conditions, cold sores can spread to multiple other body sites, including the genitals. Cold sores can spread from person to person (are contagious) until the sores crust over completely. Most cold sores go away within 2 weeks. What are the causes? Cold sores are caused by an infection from a common type of herpes simplex virus (HSV-1). HSV-1 is closely related to the HSV-2virus, which is the virus that causes genital herpes, but these viruses are not the same. Once a person is infected with HSV-1, the virus remains permanently in the body. HSV-1 is spread from person to person through close contact, such as through kissing, touching the affected area, or sharing personal items such as lip balm, razors, a drinking glass, or eating utensils. What increases the risk? You are more likely to develop this condition if you: Are tired, stressed, or sick. Are menstruating. Are pregnant. Take certain medicines. Are exposed to cold weather or too much sun. What are the signs or symptoms? Symptoms of a cold sore outbreak go through different stages. These are the stages of a cold sore: Tingling, itching, or burning is felt 1-2 days before the outbreak. Fluid-filled blisters appear on the lips, inside the mouth, on the nose, or on the cheeks. The blisters start to ooze clear fluid. The blisters dry up, and a yellow  crust appears in their place. The crust falls off. In some cases, other symptoms can develop during a cold sore outbreak. These can include: Fever. Sore throat. Headache. Muscle aches. Swollen neck glands. How is this diagnosed? This condition is diagnosed based on your medical history and a physical exam. Your health care provider may do a blood test or may swab some fluid from your sore and then examine the swab in the lab. How is this treated? There is no cure for cold sores or HSV-1. There is also no vaccine for HSV-1. Most cold sores go away on their own without treatment within 2 weeks. Medicines cannot make the infection go away, but your health care provider may prescribe medicines to: Help relieve some of the pain associated with the sores. Work to stop the virus from multiplying. Shorten healing time. Medicines may be in the form of creams, gels, pills, or a shot. Follow these instructions at home: Medicines Take or apply over-the-counter and prescription medicines only as told by your health care provider. Use a cotton-tip swab to apply creams or gels to your sores. Ask your health care provider if you can take lysine supplements. Research has found that lysine may help heal the cold sore faster and prevent outbreaks. Sore care  Do not touch the sores or pick the scabs. Wash your hands often. Do not touch your eyes without washing your hands first. Keep the sores clean and dry. If  directed, apply ice to the sores: Put ice in a plastic bag. Place a towel between your skin and the bag. Leave the ice on for 20 minutes, 2-3 times a day. Eating and drinking Eat a soft, bland diet. Avoid eating hot, cold, or salty foods. Use a straw if it hurts to drink out of a glass. Eat foods that are rich in lysine, such as meat, fish, and dairy products. Avoid sugary foods, chocolates, nuts, and grains. These foods are rich in a nutrient called arginine, which can cause the virus to  multiply. Lifestyle Do not kiss, have oral sex, or share personal items until your sores heal. Stress, poor sleep, and being out in the sun can trigger outbreaks. Make sure you: Do activities that help you relax, such as deep breathing exercises or meditation. Get enough sleep. Apply sunscreen on your lips before you go out in the sun. Contact a health care provider if: You have symptoms for more than 2 weeks. You have pus coming from the sores. You have redness that is spreading. You have pain or irritation in your eye. You get sores on your genitals. Your sores do not heal within 2 weeks. You have frequent cold sore outbreaks. Get help right away if you have: A fever and your symptoms suddenly get worse. A headache and confusion. Fatigue or loss of appetite. A stiff neck or sensitivity to light. Summary A cold sore, also called a fever blister, is a small, fluid-filled sore that forms inside the mouth or on the lips, gums, nose, chin, or cheeks. Most cold sores go away on their own without treatment within 2 weeks. Your health care provider may prescribe medicines to help relieve some of the pain, work to stop the virus from multiplying, and shorten healing time. Wash your hands often. Do not touch your eyes without washing your hands first. Do not kiss, have oral sex, or share personal items until your sores heal. Contact a health care provider if your sores do not heal within 2 weeks. This information is not intended to replace advice given to you by your health care provider. Make sure you discuss any questions you have with your health care provider. Document Revised: 11/22/2018 Document Reviewed: 01/02/2018 Elsevier Patient Education  [place].

## 2021-06-11 NOTE — Assessment & Plan Note (Addendum)
Patient's BP today is 130/80. She is tolerating the medication well. Continue current medication regimen given good control. Refills for lisinopril and triamterene-HCTZ sent.

## 2021-06-11 NOTE — Assessment & Plan Note (Addendum)
Patient is experiencing heightened emotional stresses due to her upcoming trip to [place], but given her stability on and tolerance of venlafaxine, will continue current dose. Refills sent.

## 2021-06-11 NOTE — Assessment & Plan Note (Deleted)
Patient's last lipid panel in June 2022 was overall reassuring. Refills for levothyroxine sent.

## 2021-06-12 ENCOUNTER — Ambulatory Visit: Payer: Medicare HMO | Admitting: Obstetrics and Gynecology

## 2021-06-16 ENCOUNTER — Encounter: Payer: Medicare HMO | Attending: Obstetrics and Gynecology | Admitting: Physical Therapy

## 2021-06-21 NOTE — Progress Notes (Signed)
SUBJECTIVE:   CHIEF COMPLAINT / HPI:   Sherry Anderson (MRN: [id number]) is a 60 y.o. female with a history of HTN, pulmonary embolism and DVT, depression, GERD, and HLD who presents for follow up.  Depression Patient has upcoming trip to [place], and this trip is bringing up emotions related to her parents' death. She is going to see her brother who she has not seen in 75 years. She feels like her current dose of venlafaxine XR 225mg  daily is helping her through this stressful time. She takes the medication every day and has no side effects. She denies SI/HI.  GERD Patient states she has been taking her omeprazole 20mg  twice daily as prescribed, and her symptoms are well controlled.  HTN Patient states she has been taking her trimaterene-HCTZ 37.5-25mg  daily and lisinopril 20mg  daily as prescribed, and her symptoms are well controlled.  Shoulder and neck tenderness Patient has experienced tenderness around her shoulders bilaterally and neck recently. She notices the pain mostly after working throughout the day or after long walks. She feels the area is very tense. She has not tried anything to relieve the pain. She has not had numbness of tingling of her entire arms, but she does experience some hand numbness and tingling when lying in bed at night.  Lesions on upper lip Patient has had some painful lesions on her upper lip. She mentions that her daughter in law has had similar lesions and has taken acyclovir. She would like some of this medication as well to heal the lesions.  OBJECTIVE:   BP 130/80   Pulse 90   Ht 5\' 2"  (1.575 m)   Wt 165 lb 3.2 oz (74.9 kg)   LMP  (LMP Unknown)   SpO2 98%   BMI 30.22 kg/m    PHYSICAL EXAM  GEN: Well-developed, in NAD HEAD: NCAT CVS: RRR, normal S1/S2, no murmurs, rubs, gallops RESP: Breathing comfortably on RA, no retractions, wheezes, rhonchi, or crackles ABD: Non-distended MSK: Tenderness to palpation along trapezius bilaterally.  Cervical ROM intact without deficits. NEU: Movement and sensation of extremities grossly intact bilaterally, Tinel's sign and Phalen's test negative PSY: Tearful during interview, thought content and process normal SKIN: Clustered vesicular lesions on left upper lip  ASSESSMENT/PLAN:   HYPERTENSION, BENIGN ESSENTIAL Patient's BP today is 130/80. She is tolerating the medication well. Continue current medication regimen given good control. Refills for lisinopril and triamterene-HCTZ sent.  GERD (gastroesophageal reflux disease) Patient is tolerating her omeprazole well. Continue current medication regimen given good control of symptoms. Refills sent.  Mood disorder [name] Patient is experiencing heightened emotional stresses due to her upcoming trip to [place], but given her stability on and tolerance of venlafaxine, will continue current dose. Refills sent.  Primary herpes simplex infection of lips Patient's lesions on her lips are likely cold sores given their appearance and distribution. Will prescribe Valtrex for treatment.  Subclinical hypothyroidism Normal TSH in February Continue levothyroxine   Muscle tension overlying trapezius Patient's tenderness of the trapezius muscle after activity is likely muscle tension. Her recent emotional stressors could also be playing a role in her symptoms. Will try conservative management for now with either heating or cold pad plus either Tylenol or ibuprofen. If symptoms persist, can consider muscle relaxant. Will assess improvement at next visit.  Health maintenance Patient received flu vaccine and COVID bivalent booster today.  Follow up in 3 months for routine medical problems or sooner if needed.  [place]  Patient seen along with [employer] student Branden Mabe. I personally evaluated this patient along with the student, and verified all aspects of the history, physical exam, and medical decision making  as documented by the student. I agree with the student's documentation and have made all necessary edits.  Chrisandra Netters, MD  [place]  o

## 2021-06-21 NOTE — Assessment & Plan Note (Signed)
Normal TSH in February Continue levothyroxine

## 2021-06-23 ENCOUNTER — Other Ambulatory Visit: Payer: Self-pay | Admitting: Family Medicine

## 2021-07-04 ENCOUNTER — Other Ambulatory Visit: Payer: Self-pay | Admitting: Family Medicine

## 2021-07-04 DIAGNOSIS — F39 Unspecified mood [affective] disorder: Secondary | ICD-10-CM

## 2021-09-01 ENCOUNTER — Other Ambulatory Visit: Payer: Self-pay | Admitting: Family Medicine

## 2021-09-01 ENCOUNTER — Encounter: Payer: Self-pay | Admitting: Obstetrics & Gynecology

## 2021-09-01 ENCOUNTER — Ambulatory Visit (INDEPENDENT_AMBULATORY_CARE_PROVIDER_SITE_OTHER): Payer: Medicare HMO | Admitting: Obstetrics & Gynecology

## 2021-09-01 ENCOUNTER — Other Ambulatory Visit: Payer: Self-pay

## 2021-09-01 ENCOUNTER — Ambulatory Visit
Admission: RE | Admit: 2021-09-01 | Discharge: 2021-09-01 | Disposition: A | Discharge status: Home / Self Care | Payer: Medicare HMO | Source: Ambulatory Visit | Attending: Family Medicine | Admitting: Family Medicine

## 2021-09-01 ENCOUNTER — Other Ambulatory Visit (HOSPITAL_COMMUNITY)
Admission: RE | Admit: 2021-09-01 | Discharge: 2021-09-01 | Disposition: A | Discharge status: Home / Self Care | Payer: Medicare HMO | Source: Ambulatory Visit | Attending: Obstetrics & Gynecology | Admitting: Obstetrics & Gynecology

## 2021-09-01 VITALS — BP 114/70 | HR 76 | Resp 16 | Ht 61.75 in | Wt 160.0 lb

## 2021-09-01 DIAGNOSIS — Z780 Asymptomatic menopausal state: Secondary | ICD-10-CM

## 2021-09-01 DIAGNOSIS — Z1272 Encounter for screening for malignant neoplasm of vagina: Secondary | ICD-10-CM | POA: Insufficient documentation

## 2021-09-01 DIAGNOSIS — Z9289 Personal history of other medical treatment: Secondary | ICD-10-CM

## 2021-09-01 DIAGNOSIS — Z1231 Encounter for screening mammogram for malignant neoplasm of breast: Secondary | ICD-10-CM

## 2021-09-01 DIAGNOSIS — M8589 Other specified disorders of bone density and structure, multiple sites: Secondary | ICD-10-CM

## 2021-09-01 DIAGNOSIS — Z90722 Acquired absence of ovaries, bilateral: Secondary | ICD-10-CM

## 2021-09-01 DIAGNOSIS — Z9189 Other specified personal risk factors, not elsewhere classified: Secondary | ICD-10-CM

## 2021-09-01 DIAGNOSIS — Z853 Personal history of malignant neoplasm of breast: Secondary | ICD-10-CM

## 2021-09-01 DIAGNOSIS — Z01419 Encounter for gynecological examination (general) (routine) without abnormal findings: Secondary | ICD-10-CM

## 2021-09-01 DIAGNOSIS — D069 Carcinoma in situ of cervix, unspecified: Secondary | ICD-10-CM

## 2021-09-01 DIAGNOSIS — Z90710 Acquired absence of both cervix and uterus: Secondary | ICD-10-CM

## 2021-09-01 DIAGNOSIS — Z9079 Acquired absence of other genital organ(s): Secondary | ICD-10-CM

## 2021-09-01 HISTORY — DX: Personal history of irradiation: Z92.3

## 2021-09-01 NOTE — Progress Notes (Signed)
Sherry Anderson 1960/10/29 [id number]   History:    61 y.o. G2P2L3   RP: Established patient presenting for annual gyn exam    HPI: S/P XI Robotic TLH/BSO in 09/2017 for AIS.  No pelvic pain anymore.  Last Pap Neg 08/2020.  Pap reflex today.  Urine/BMs normal.  Breasts normal. Screen Mammo Neg 10/2019, will schedule now.  BMI 29.5. Colono 01/2021.  BD Osteopenia Lt Fem Neck T-Score -2.1 in 09/2020.  Health labs with Fam MD.   Past medical history,surgical history, family history and social history were all reviewed and documented in the EPIC chart.  Gynecologic History No LMP recorded (lmp unknown). Patient has had a hysterectomy.  Obstetric History OB History  Gravida Para Term Preterm AB Living  2         3  SAB IAB Ectopic Multiple Live Births        1      # Outcome Date GA Lbr Len/2nd Weight Sex Delivery Anes PTL Lv  2 Gravida           1 Gravida              ROS: A ROS was performed and pertinent positives and negatives are included in the history.  GENERAL: No fevers or chills. HEENT: No change in vision, no earache, sore throat or sinus congestion. NECK: No pain or stiffness. CARDIOVASCULAR: No chest pain or pressure. No palpitations. PULMONARY: No shortness of breath, cough or wheeze. GASTROINTESTINAL: No abdominal pain, nausea, vomiting or diarrhea, melena or bright red blood per rectum. GENITOURINARY: No urinary frequency, urgency, hesitancy or dysuria. MUSCULOSKELETAL: No joint or muscle pain, no back pain, no recent trauma. DERMATOLOGIC: No rash, no itching, no lesions. ENDOCRINE: No polyuria, polydipsia, no heat or cold intolerance. No recent change in weight. HEMATOLOGICAL: No anemia or easy bruising or bleeding. NEUROLOGIC: No headache, seizures, numbness, tingling or weakness. PSYCHIATRIC: No depression, no loss of interest in normal activity or change in sleep pattern.     Exam:   BP 114/70    Pulse 76    Resp 16    Ht 5' 1.75" (1.568 m)    Wt 160 lb (72.6 kg)     LMP  (LMP Unknown)    BMI 29.50 kg/m   Body mass index is 29.5 kg/m.  General appearance : Well developed well nourished female. No acute distress HEENT: Eyes: no retinal hemorrhage or exudates,  Neck supple, trachea midline, no carotid bruits, no thyroidmegaly Lungs: Clear to auscultation, no rhonchi or wheezes, or rib retractions  Heart: Regular rate and rhythm, no murmurs or gallops Breast:Examined in sitting and supine position were symmetrical in appearance, no palpable masses or tenderness,  no skin retraction, no nipple inversion, no nipple discharge, no skin discoloration, no axillary or supraclavicular lymphadenopathy Abdomen: no palpable masses or tenderness, no rebound or guarding Extremities: no edema or skin discoloration or tenderness  Pelvic: Vulva: Normal             Vagina: No gross lesions or discharge  Cervix/Uterus absent  Adnexa  Without masses or tenderness  Anus: Normal   Assessment/Plan:  61 y.o. female for annual exam   1. Encounter for Papanicolaou smear of vagina as part of routine gynecological examination S/P XI Robotic TLH/BSO in 09/2017 for AIS.  No pelvic pain anymore.  Last Pap Neg 08/2020.  Pap reflex today.  Urine/BMs normal.  Breasts normal. Screen Mammo Neg 10/2019, will schedule now.  BMI 29.5. Colono 01/2021.  BD Osteopenia Lt Fem Neck T-Score -2.1 in 09/2020.  Health labs with Fam MD. - Cytology - PAP( [place])  2. At risk for cancer  3. Adenocarcinoma in situ (AIS) of uterine cervix - Cytology - PAP( [place])  4. S/P total hysterectomy and BSO (bilateral salpingo-oophorectomy)  5. Postmenopause Well on no HRT.  6. Osteopenia of multiple sites BD Osteopenia T-Score -2.1 at Lt Fem Neck in 09/2020.  Vit D supplements, Ca++ 1.5 g/d total, regular wt bearing physical activities.  Repeat BD at 2 years.  7. Personal history of breast cancer  8. Personal history of other medical treatment   Princess Bruins MD, 10:01 AM 09/01/2021

## 2021-09-02 LAB — CYTOLOGY - PAP: Diagnosis: NEGATIVE

## 2021-09-29 ENCOUNTER — Other Ambulatory Visit: Payer: Self-pay

## 2021-09-29 ENCOUNTER — Ambulatory Visit (INDEPENDENT_AMBULATORY_CARE_PROVIDER_SITE_OTHER): Payer: Medicare HMO | Admitting: Family Medicine

## 2021-09-29 ENCOUNTER — Encounter: Payer: Self-pay | Admitting: Family Medicine

## 2021-09-29 VITALS — BP 114/60 | HR 63 | Ht 61.0 in | Wt 162.0 lb

## 2021-09-29 DIAGNOSIS — I10 Essential (primary) hypertension: Secondary | ICD-10-CM

## 2021-09-29 DIAGNOSIS — D0512 Intraductal carcinoma in situ of left breast: Secondary | ICD-10-CM | POA: Diagnosis not present

## 2021-09-29 MED ORDER — TRIAMTERENE-HCTZ 37.5-25 MG PO CAPS: ORAL_CAPSULE | ORAL | 3 refills | Status: DC

## 2021-09-29 MED ORDER — LISINOPRIL 20 MG PO TABS: 20.0000 mg | ORAL_TABLET | Freq: Every day | ORAL | 3 refills | Status: DC

## 2021-09-29 NOTE — Progress Notes (Signed)
° ° °  SUBJECTIVE:   CHIEF COMPLAINT / HPI:   HTN: BP is at goal today: 114/60. She is on triamterene-HCTZ 37-25 mg and lisinopril 20 mg qd. She reports no hypotensive symptoms or falls, asx today. Needs BMP check, and refills.  Patient had breast cancer in 2002 and had a lumpectomy and hysterectomy. She had a mammogram in January 2023 that noted no concern for malignancy, but due to heterogeneously dense breast tissue small cancers could be missed. Her concern is should she have other screening (such as ultrasound) prior to next year. She reports no skin or breast changes at this time.  PERTINENT  PMH / PSH: hypertension, mood disorder, h/o breast cancer  OBJECTIVE:   BP 114/60    Pulse 63    Ht $R'5\' 1"'yY$  (1.549 m)    Wt 162 lb (73.5 kg)    LMP  (LMP Unknown)    SpO2 98%    BMI 30.61 kg/m   Nursing note and vitals reviewed GEN: age appropriate, LW, resting comfortably in chair, NAD, WNWD HEENT: NCAT. PERRLA. Sclera without injection or icterus. MMM.  Neck: Supple. No LAD Cardiac: Regular rate and rhythm. Normal S1/S2. No murmurs, rubs, or gallops appreciated. 2+ radial pulses. Lungs: Clear bilaterally to ascultation. No increased WOB, no accessory muscle usage. No w/r/r. Neuro: AOx3  Ext: no edema Psych: Pleasant and appropriate   ASSESSMENT/PLAN:   HYPERTENSION, BENIGN ESSENTIAL Chronic, good control as above. No hypotension. Recommend continue current regimen as listed above. BMP ordered.  CA IN SITU, BREAST Mammography from last month notes heterogeneously dense breast tissue, but no specific recommendation for subsequent diagnostic tests. Will discuss with radiology if she should have different screening method next year or closer interval follow up. Patient does not have family hx of breast cancer, unclear if brca testing ever done. Will recommend brca testing if patient would like.   ETA: discussed with Dr. Shelly Bombard, radiology, who recommended regular mammography screening if no BRCA.        Gladys Damme, MD [place]

## 2021-09-29 NOTE — Patient Instructions (Signed)
It was a pleasure to see you today!  Your blood pressure is at goal, keep up the good work! We will get some labs today.  If they are abnormal or we need to do something about them, I will call you.  If they are normal, I will send you a message on MyChart (if it is active) or a letter in the mail.  If you don't hear from [place] in 2 weeks, please call the office  [phone]. Next follow up in 3-6 months from BP    Be Well,  Dr. Chauncey Reading

## 2021-09-29 NOTE — Assessment & Plan Note (Signed)
Chronic, good control as above. No hypotension. Recommend continue current regimen as listed above. BMP ordered.

## 2021-09-29 NOTE — Assessment & Plan Note (Addendum)
Mammography from last month notes heterogeneously dense breast tissue, but no specific recommendation for subsequent diagnostic tests. Will discuss with radiology if she should have different screening method next year or closer interval follow up. Patient does not have family hx of breast cancer, unclear if brca testing ever done. Will recommend brca testing if patient would like.   ETA: discussed with Dr. Shelly Bombard, radiology, who recommended regular mammography screening if no BRCA.

## 2021-09-30 ENCOUNTER — Telehealth: Payer: Self-pay | Admitting: Licensed Clinical Social Worker

## 2021-09-30 ENCOUNTER — Other Ambulatory Visit: Payer: Self-pay | Admitting: Family Medicine

## 2021-09-30 DIAGNOSIS — D0512 Intraductal carcinoma in situ of left breast: Secondary | ICD-10-CM

## 2021-09-30 LAB — BASIC METABOLIC PANEL
BUN/Creatinine Ratio: 17 (ref 12–28)
BUN: 15 mg/dL (ref 8–27)
CO2: 24 mmol/L (ref 20–29)
Calcium: 9.7 mg/dL (ref 8.7–10.3)
Chloride: 101 mmol/L (ref 96–106)
Creatinine, Ser: 0.88 mg/dL (ref 0.57–1.00)
Glucose: 95 mg/dL (ref 70–99)
Potassium: 3.8 mmol/L (ref 3.5–5.2)
Sodium: 140 mmol/L (ref 134–144)
eGFR: 75 mL/min/{1.73_m2} (ref >59)

## 2021-09-30 NOTE — Telephone Encounter (Signed)
Scheduled appt per 2/15 referral. Pt is aware of appt date and time. Pt is aware to arrive 15 mins prior to appt time and to bring and updated insurance card. Pt is aware of appt location.   °

## 2021-09-30 NOTE — Progress Notes (Signed)
Patient has personal h/o DCIS in 2002, never tested for BRCA. After counseling on brca, patient would like to be referred to genetics.  Gladys Damme, MD [facility], PGY-3

## 2021-10-05 ENCOUNTER — Other Ambulatory Visit: Payer: Self-pay | Admitting: Family Medicine

## 2021-10-26 ENCOUNTER — Other Ambulatory Visit: Payer: Self-pay

## 2021-10-26 ENCOUNTER — Inpatient Hospital Stay: Payer: Medicare HMO | Attending: Genetic Counselor | Admitting: Licensed Clinical Social Worker

## 2021-10-26 ENCOUNTER — Encounter: Payer: Self-pay | Admitting: Licensed Clinical Social Worker

## 2021-10-26 ENCOUNTER — Inpatient Hospital Stay: Payer: Medicare HMO

## 2021-10-26 DIAGNOSIS — Z86000 Personal history of in-situ neoplasm of breast: Secondary | ICD-10-CM

## 2021-10-26 DIAGNOSIS — Z800 Family history of malignant neoplasm of digestive organs: Secondary | ICD-10-CM

## 2021-10-26 NOTE — Progress Notes (Signed)
REFERRING PROVIDER: Zenia Resides, MD [place],   [id number]  PRIMARY PROVIDER:  Leeanne Rio, MD  PRIMARY REASON FOR VISIT:  1. History of ductal carcinoma in situ (DCIS) of breast   2. Family history of pancreatic cancer      HISTORY OF PRESENT ILLNESS:   Ms. Fifer, a 61 y.o. female, was seen for a [facility] cancer genetics consultation at the request of Dr. Andria Frames due to a personal and family history of cancer.  Ms. Vantassell presents to clinic today to discuss the possibility of a hereditary predisposition to cancer, genetic testing, and to further clarify her future cancer risks, as well as potential cancer risks for family members.   At the age of 77, Ms. Kirstein was diagnosed with DCIS of the left breast. The treatment plan included lumpectomy, radiation and antiestrogen therapy.   CANCER HISTORY:  Oncology History   No history exists.     RISK FACTORS:  Menarche was at age 57.  First live birth at age 67.  OCP use for approximately 0 years.  Ovaries intact: no.  Hysterectomy: yes.  Menopausal status: postmenopausal.  HRT use: 0 years. Colonoscopy: yes;  few polyps .  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   Associated with high-grade dysplasia   Anxiety and depression    Breast cancer ([facility]) 2002   left breast cancer    Depression    Diverticulosis of colon    DVT (deep venous thrombosis) ([facility]) 2021   provoked by surgery   Essential hypertension, benign    Family history of pancreatic cancer    GERD (gastroesophageal reflux disease)    Hepatitis    unsure of which type   Hiatal hernia    High grade squamous intraepithelial cervical dysplasia 05/2017   Associated with adenocarcinoma in situ   History of adenomatous polyp of colon    tubular adenoma's  02-08-2017   History of gastritis    History of left breast cancer 2002---- per pt no recurrence   dx  DCIS --- s/p  left breast lumpectomy;  per pt radiation therapy completed same year and completed antiestrogen therapy    History of radiation therapy    2002   left breast   History of trigeminal neuralgia    Hypothyroidism    Internal hemorrhoids    Pain    right ear and jaw   Personal history of radiation therapy    Spinal stenosis    Spondylolisthesis    SVD (spontaneous vaginal delivery)    x 1   Tubular adenoma of colon     Past Surgical History:  Procedure Laterality Date   BREAST LUMPECTOMY Left 2002   ductal CA in situ   CERVICAL CONIZATION W/BX N/A 06/08/2017   Procedure: CONIZATION CERVIX WITH BIOPSY;  Surgeon: Anastasio Auerbach, MD;  Location: [facility];  Service: Gynecology;  Laterality: N/A;   CESAREAN SECTION  1990   twins   COLONOSCOPY  02-08-2017   dr Karleen Hampshire   CRANIECTOMY     shunt   DILATATION & CURETTAGE/HYSTEROSCOPY WITH MYOSURE N/A 06/08/2017   Procedure: Octa;  Surgeon: Anastasio Auerbach, MD;  Location: [facility];  Service: Gynecology;  Laterality: N/A;   ESOPHAGOGASTRODUODENOSCOPY  last one 11-24-2016   dr Karleen Hampshire   ROBOTIC ASSISTED TOTAL HYSTERECTOMY WITH BILATERAL SALPINGO OOPHERECTOMY N/A 09/21/2017   Procedure: XI ROBOTIC  ASSISTED TOTAL HYSTERECTOMY WITH BILATERAL SALPINGO OOPHORECTOMY, Lysis of Adhesions;  Surgeon: Princess Bruins, MD;  Location: [facility];  Service: Gynecology;  Laterality: N/A;   TOOTH EXTRACTION     TRANSFORAMINAL LUMBAR INTERBODY FUSION (TLIF) WITH PEDICLE SCREW FIXATION 2 LEVEL N/A 04/13/2018   Procedure: RIGHT-SIDED LUMBAR 4-5 AND LUMBAR 5-SACRUM 1 TRANSFORAMINAL LUMBAR INTERBODY FUSION WITH INSTRUMENTATION AND ALLOGRAFT;  Surgeon: Phylliss Bob, MD;  Location: [facility];  Service: Orthopedics;  Laterality: N/A;   WISDOM TOOTH EXTRACTION      Social History   Socioeconomic History   Marital status: Legally Separated    Spouse name: Not on file    Number of children: 3   Years of education: 6   Highest education level: 6th grade  Occupational History   Occupation: Disability  Tobacco Use   Smoking status: Never   Smokeless tobacco: Never  Vaping Use   Vaping Use: Never used  Substance and Sexual Activity   Alcohol use: No   Drug use: No   Sexual activity: Not Currently    Birth control/protection: Surgical    Comment: 1st intercourse 61 yo-Fewer than 5 partners, hysterectomy  Other Topics Concern   Not on file  Social History Narrative   Moved from [place] to [place], to [place] at home with her nephew and her son.   Right-handed.   Occasional use of caffeine.   Social Determinants of Health   Financial Resource Strain: Not on file  Food Insecurity: Not on file  Transportation Needs: Not on file  Physical Activity: Not on file  Stress: Not on file  Social Connections: Not on file     FAMILY HISTORY:  We obtained a detailed, 4-generation family history.  Significant diagnoses are listed below: Family History  Problem Relation Age of Onset   Diabetes Mother    Hypertension Mother    Heart disease Father        Pacemaker   Thyroid disease Sister    Schizophrenia Brother    Pancreatic cancer Cousin 76   Rectal cancer Neg Hx    Stomach cancer Neg Hx    Ms. Savary has 3 sons, two are twins age 83 and her other son is 2. She also has 5 brothers and 4 sisters in their 40s-70s, none have had cancer.  Ms. Woodfin's mother died at 73 with no cancer history. Patient had 4 maternal uncles and 1 aunt, no cancers. The daughter of one of her uncles died of pancreatic cancer at 7. Maternal grandparents passed over the age of 41.  Ms. Lewman's father died at 7. Patient had 2 paternal aunts, 5 uncles, no cancers. Paternal grandparents passed in their 59s-70s.  Ms. Delahoz is unaware of previous family history of genetic testing for hereditary cancer risks. Patient's maternal ancestors are of  [place] descent, and paternal ancestors are of [place] descent. There is no reported Ashkenazi Jewish ancestry. There is no known consanguinity.    GENETIC COUNSELING ASSESSMENT: Ms. Sandner is a 61 y.o. female with a personal and family history of cancer which is somewhat suggestive of a hereditary cancer syndrome and predisposition to cancer. We, therefore, discussed and recommended the following at today's visit.   DISCUSSION: We discussed that approximately 10% of breast cancer is hereditary. Most cases of hereditary breast cancer are associated with BRCA1/BRCA2 genes, which are also associated with pancreatic cancer. There are other genes associated with hereditary cancer as well. Cancers and risks are gene specific.  We discussed  that testing is beneficial for several reasons including knowing about other cancer risks, identifying potential screening and risk-reduction options that may be appropriate, and to understand if other family members could be at risk for cancer and allow them to undergo genetic testing.   We reviewed the characteristics, features and inheritance patterns of hereditary cancer syndromes. We also discussed genetic testing, including the appropriate family members to test, the process of testing, insurance coverage and turn-around-time for results. We discussed the implications of a negative, positive and/or variant of uncertain significant result. We recommended Ms. Rizo pursue genetic testing for the [facility] Multi-Cancer+RNA gene panel.   The Multi-Cancer Panel + RNA offered by [facility] includes sequencing and/or deletion duplication testing of the following 84 genes: AIP, ALK, APC, ATM, AXIN2,BAP1,  BARD1, BLM, BMPR1A, BRCA1, BRCA2, BRIP1, CASR, CDC73, CDH1, CDK4, CDKN1B, CDKN1C, CDKN2A (p14ARF), CDKN2A (p16INK4a), CEBPA, CHEK2, CTNNA1, DICER1, DIS3L2, EGFR (c.2369C>T, p.Thr790Met variant only), EPCAM (Deletion/duplication testing only), FH, FLCN, GATA2, GPC3, GREM1  (Promoter region deletion/duplication testing only), HOXB13 (c.251G>A, p.Gly84Glu), HRAS, KIT, MAX, MEN1, MET, MITF (c.952G>A, p.Glu318Lys variant only), MLH1, MSH2, MSH3, MSH6, MUTYH, NBN, NF1, NF2, NTHL1, PALB2, PDGFRA, PHOX2B, PMS2, POLD1, POLE, POT1, PRKAR1A, PTCH1, PTEN, RAD50, RAD51C, RAD51D, RB1, RECQL4, RET, RUNX1, SDHAF2, SDHA (sequence changes only), SDHB, SDHC, SDHD, SMAD4, SMARCA4, SMARCB1, SMARCE1, STK11, SUFU, TERC, TERT, TMEM127, TP53, TSC1, TSC2, VHL, WRN and WT1.  Based on Ms. Luckman's personal and family history of cancer, she meets medical criteria for genetic testing. Despite that she meets criteria, she may still have an out of pocket cost. We discussed that if her out of pocket cost for testing is over $100, the laboratory will call and confirm whether she wants to proceed with testing.  If the out of pocket cost of testing is less than $100 she will be billed by the genetic testing laboratory.   PLAN: After considering the risks, benefits, and limitations, Ms. Rossel provided informed consent to pursue genetic testing and the blood sample was sent to [facility] for analysis of the Multi-Cancer+RNA panel. Results should be available within approximately 2-3 weeks' time, at which point they will be disclosed by telephone to Ms. Bohl, as will any additional recommendations warranted by these results. Ms. Stencil will receive a summary of her genetic counseling visit and a copy of her results once available. This information will also be available in Epic.   Ms. Delaluz's questions were answered to her satisfaction today. Our contact information was provided should additional questions or concerns arise. Thank you for the referral and allowing [place] to share in the care of your patient.   Faith Rogue, MS, [facility] Genetic Counselor [email or web address] Phone: [phone]  The patient was seen for a total of 30 minutes in face-to-face genetic counseling.   Patient was seen alone. Dr. Grayland Ormond was available for discussion regarding this case.   _______________________________________________________________________ For Office Staff:  Number of people involved in session: 1 Was an Intern/ student involved with case: no

## 2021-10-29 DIAGNOSIS — H524 Presbyopia: Secondary | ICD-10-CM | POA: Diagnosis not present

## 2021-11-12 ENCOUNTER — Telehealth: Payer: Self-pay | Admitting: Licensed Clinical Social Worker

## 2021-11-24 ENCOUNTER — Other Ambulatory Visit: Payer: Self-pay

## 2021-11-24 ENCOUNTER — Ambulatory Visit: Payer: Self-pay | Admitting: Licensed Clinical Social Worker

## 2021-11-24 ENCOUNTER — Encounter: Payer: Self-pay | Admitting: Licensed Clinical Social Worker

## 2021-11-24 ENCOUNTER — Ambulatory Visit (INDEPENDENT_AMBULATORY_CARE_PROVIDER_SITE_OTHER): Payer: Medicare HMO | Admitting: Family Medicine

## 2021-11-24 ENCOUNTER — Encounter: Payer: Self-pay | Admitting: Family Medicine

## 2021-11-24 VITALS — BP 130/83 | HR 76 | Wt 165.2 lb

## 2021-11-24 DIAGNOSIS — K219 Gastro-esophageal reflux disease without esophagitis: Secondary | ICD-10-CM | POA: Diagnosis not present

## 2021-11-24 DIAGNOSIS — R7303 Prediabetes: Secondary | ICD-10-CM | POA: Diagnosis not present

## 2021-11-24 DIAGNOSIS — Z0000 Encounter for general adult medical examination without abnormal findings: Secondary | ICD-10-CM

## 2021-11-24 DIAGNOSIS — Z1379 Encounter for other screening for genetic and chromosomal anomalies: Secondary | ICD-10-CM

## 2021-11-24 DIAGNOSIS — R1012 Left upper quadrant pain: Secondary | ICD-10-CM

## 2021-11-24 DIAGNOSIS — I10 Essential (primary) hypertension: Secondary | ICD-10-CM | POA: Diagnosis not present

## 2021-11-24 DIAGNOSIS — R69 Illness, unspecified: Secondary | ICD-10-CM | POA: Diagnosis not present

## 2021-11-24 DIAGNOSIS — R5383 Other fatigue: Secondary | ICD-10-CM | POA: Diagnosis not present

## 2021-11-24 DIAGNOSIS — F32A Depression, unspecified: Secondary | ICD-10-CM

## 2021-11-24 DIAGNOSIS — F39 Unspecified mood [affective] disorder: Secondary | ICD-10-CM

## 2021-11-24 DIAGNOSIS — E038 Other specified hypothyroidism: Secondary | ICD-10-CM

## 2021-11-24 LAB — POCT GLYCOSYLATED HEMOGLOBIN (HGB A1C): Hemoglobin A1C: 5.7 % — AB (ref 4.0–5.6)

## 2021-11-24 NOTE — Patient Instructions (Signed)
It was great to see you again today! ? ?Checking labs today ?Getting CT scan ?Referring to physical therapy ?Increase activity as you are able ? ?Follow up in 3 months, sooner if needed ? ?Be well, ?Dr. Ardelia Mems  ? ?Health Maintenance, Female ?Adopting a healthy lifestyle and getting preventive care are important in promoting health and wellness. Ask your health care provider about: ?The right schedule for you to have regular tests and exams. ?Things you can do on your own to prevent diseases and keep yourself healthy. ?What should I know about diet, weight, and exercise? ?Eat a healthy diet ? ?Eat a diet that includes plenty of vegetables, fruits, low-fat dairy products, and lean protein. ?Do not eat a lot of foods that are high in solid fats, added sugars, or sodium. ?Maintain a healthy weight ?Body mass index (BMI) is used to identify weight problems. It estimates body fat based on height and weight. Your health care provider can help determine your BMI and help you achieve or maintain a healthy weight. ?Get regular exercise ?Get regular exercise. This is one of the most important things you can do for your health. Most adults should: ?Exercise for at least 150 minutes each week. The exercise should increase your heart rate and make you sweat (moderate-intensity exercise). ?Do strengthening exercises at least twice a week. This is in addition to the moderate-intensity exercise. ?Spend less time sitting. Even light physical activity can be beneficial. ?Watch cholesterol and blood lipids ?Have your blood tested for lipids and cholesterol at 61 years of age, then have this test every 5 years. ?Have your cholesterol levels checked more often if: ?Your lipid or cholesterol levels are high. ?You are older than 62 years of age. ?You are at high risk for heart disease. ?What should I know about cancer screening? ?Depending on your health history and family history, you may need to have cancer screening at various ages.  This may include screening for: ?Breast cancer. ?Cervical cancer. ?Colorectal cancer. ?Skin cancer. ?Lung cancer. ?What should I know about heart disease, diabetes, and high blood pressure? ?Blood pressure and heart disease ?High blood pressure causes heart disease and increases the risk of stroke. This is more likely to develop in people who have high blood pressure readings or are overweight. ?Have your blood pressure checked: ?Every 3-5 years if you are 58-59 years of age. ?Every year if you are 66 years old or older. ?Diabetes ?Have regular diabetes screenings. This checks your fasting blood sugar level. Have the screening done: ?Once every three years after age 77 if you are at a normal weight and have a low risk for diabetes. ?More often and at a younger age if you are overweight or have a high risk for diabetes. ?What should I know about preventing infection? ?Hepatitis B ?If you have a higher risk for hepatitis B, you should be screened for this virus. Talk with your health care provider to find out if you are at risk for hepatitis B infection. ?Hepatitis C ?Testing is recommended for: ?Everyone born from [birth year] through 1966. ?Anyone with known risk factors for hepatitis C. ?Sexually transmitted infections (STIs) ?Get screened for STIs, including gonorrhea and chlamydia, if: ?You are sexually active and are younger than 62 years of age. ?You are older than 62 years of age and your health care provider tells you that you are at risk for this type of infection. ?Your sexual activity has changed since you were last screened, and you are at increased risk  for chlamydia or gonorrhea. Ask your health care provider if you are at risk. ?Ask your health care provider about whether you are at high risk for HIV. Your health care provider may recommend a prescription medicine to help prevent HIV infection. If you choose to take medicine to prevent HIV, you should first get tested for HIV. You should then be tested every 3  months for as long as you are taking the medicine. ?Pregnancy ?If you are about to stop having your period (premenopausal) and you may become pregnant, seek counseling before you get pregnant. ?Take 400 to 800 micrograms (mcg) of folic acid every day if you become pregnant. ?Ask for birth control (contraception) if you want to prevent pregnancy. ?Osteoporosis and menopause ?Osteoporosis is a disease in which the bones lose minerals and strength with aging. This can result in bone fractures. If you are 30 years old or older, or if you are at risk for osteoporosis and fractures, ask your health care provider if you should: ?Be screened for bone loss. ?Take a calcium or vitamin D supplement to lower your risk of fractures. ?Be given hormone replacement therapy (HRT) to treat symptoms of menopause. ?Follow these instructions at home: ?Alcohol use ?Do not drink alcohol if: ?Your health care provider tells you not to drink. ?You are pregnant, may be pregnant, or are planning to become pregnant. ?If you drink alcohol: ?Limit how much you have to: ?0-1 drink a day. ?Know how much alcohol is in your drink. In the [place] one drink equals one 12 oz bottle of beer (355 mL), one 5 oz glass of wine (148 mL), or one 1? oz glass of hard liquor (44 mL). ?Lifestyle ?Do not use any products that contain nicotine or tobacco. These products include cigarettes, chewing tobacco, and vaping devices, such as e-cigarettes. If you need help quitting, ask your health care provider. ?Do not use street drugs. ?Do not share needles. ?Ask your health care provider for help if you need support or information about quitting drugs. ?General instructions ?Schedule regular health, dental, and eye exams. ?Stay current with your vaccines. ?Tell your health care provider if: ?You often feel depressed. ?You have ever been abused or do not feel safe at home. ?Summary ?Adopting a healthy lifestyle and getting preventive care are important in promoting health  and wellness. ?Follow your health care provider's instructions about healthy diet, exercising, and getting tested or screened for diseases. ?Follow your health care provider's instructions on monitoring your cholesterol and blood pressure. ?This information is not intended to replace advice given to you by your health care provider. Make sure you discuss any questions you have with your health care provider. ?Document Revised: 12/22/2020 Document Reviewed: 12/22/2020 ?Elsevier Patient Education ? [place]. ? ?

## 2021-11-24 NOTE — Assessment & Plan Note (Signed)
Controlled with no episodes of hypotension. Continue current regimen.  ?

## 2021-11-24 NOTE — Assessment & Plan Note (Signed)
Increased fatigue and low energy recently, will recheck TSH. Discussed increasing physical activity.  ?

## 2021-11-24 NOTE — Progress Notes (Signed)
?  Date of Visit: 11/24/2021  ? ?SUBJECTIVE:  ? ?HPI: ? ?Sherry Anderson is a 61 year old female with a history of HTN, depression, pulmonary embolism and DVT, GERD, and HLD who presents today for physical exam, episodic LUQ pain, and back pain. ? ?LUQ pain: Sherry Anderson describes episodic pain that lasts for minutes to hours in her LUQ under her ribs. It occurs 1-2 times per week.  ? ?Back pain:  ?Has some pain in back. Hasn't taken anything for it. Denies having fever, saddle anesthesia, lower extremity weakness, or problems with stooling or urination.  ? ?Depression: Today she endorses fatigue and low energy, sleeping a lot, and says her mood has been "okay". Her recent trip to [place] was very positive and she reconnected with her brother whom she hadn't seen in 41 years. She denies SI/HI. Does not desire change in medications.  ? ?HTN: BP today 130/83 on second reading after elevated initially. Patient reports home blood systolic between 948 and 546. She is adherent to her antihypertensive regimen.  ? ?GERD: Her symptoms are well controlled on omeprazole.  ? ?Subclinical hypothyroidism - taking levothyroxine 22mg daily ? ?Sees GYN for well woman care ? ?OBJECTIVE:  ? ?BP 130/83   Pulse 76   Wt 165 lb 3.2 oz (74.9 kg)   LMP  (LMP Unknown)   SpO2 96%   BMI 31.21 kg/m?  ?Gen: Well-appearing, in no acute distress ?HEENT: NCAT ?Heart: RRR, normal S1/S2 ?Lungs: Normal breath sounds, no wheezes or crackles ?Abd: Soft, non-distended. LUQ: appeared raised on inspection relative to RUQ, mildly tender to palpation. ?Neuro: full strength & sensation of bilatearl lower extremities  ?Back: mild tenderness of paraspinal muscles bilatearlly.  ? ?ASSESSMENT/PLAN:  ? ?LUQ pain: CT abdomen on 08/21/2020 noted moderate hiatal hernia of gastric body and fundus, which may explain current pain but is unlikely. Given fullness in this area on exam, ordered an abdominal CT to further evaluate.  ? ?BACK PAIN:  ?Suspect mild lumbar strain.  Counseled on light exercise and stretching, referred to physical therapy.  ? ?HYPERTENSION, BENIGN ESSENTIAL ?Controlled with no episodes of hypotension. Continue current regimen.  ? ?GERD (gastroesophageal reflux disease) ?Well controlled on omeprazole. Continue current regimen ? ?Fatigue due to depression ?Increased fatigue and low energy recently, will recheck TSH. Discussed increasing physical activity.  ? ?Subclinical hypothyroidism ?Update TSH today given fatigue ? ?Mood disorder ([place] ?Overall doing relatively well. No SI/HI. Continue present medication. ? ?Health maintenance:  ?-She is up to date on pap smear, mammogram, and vaccines  ? ? ?FOLLOW UP: ?Follow up in 3 months ? ?EAnnia Belt Medical Student ?[place]? ?Patient seen along with MS3 student EAnnia Belt I personally evaluated this patient along with the student, and verified all aspects of the history, physical exam, and medical decision making as documented by the student. I agree with the student's documentation and have made all necessary edits. ? ?BChrisandra Netters MD  ?[place]  ?

## 2021-11-24 NOTE — Assessment & Plan Note (Signed)
Well controlled on omeprazole. Continue current regimen ?

## 2021-11-24 NOTE — Telephone Encounter (Signed)
Revealed negative genetic testing.  Revealed that a VUS in ATM was identified. This normal result is reassuring and indicates that it is unlikely Sherry Anderson's cancer is due to a hereditary cause.  It is unlikely that there is an increased risk of another cancer due to a mutation in one of these genes.  However, genetic testing is not perfect, and cannot definitively rule out a hereditary cause.  It will be important for her to keep in contact with genetics to learn if any additional testing may be needed in the future.    ? ?

## 2021-11-24 NOTE — Progress Notes (Signed)
HPI:  Sherry Anderson was previously seen in the [facility] clinic due to a personal and family history of cancer and concerns regarding a hereditary predisposition to cancer. Please refer to our prior cancer genetics clinic note for more information regarding our discussion, assessment and recommendations, at the time. Sherry Anderson's recent genetic test results were disclosed to her, as were recommendations warranted by these results. These results and recommendations are discussed in more detail below. ? ?CANCER HISTORY:  ?Oncology History  ? No history exists.  ? ? ?FAMILY HISTORY:  ?We obtained a detailed, 4-generation family history.  Significant diagnoses are listed below: ?Family History  ?Problem Relation Age of Onset  ? Diabetes Mother   ? Hypertension Mother   ? Heart disease Father   ?     Pacemaker  ? Thyroid disease Sister   ? Schizophrenia Brother   ? Pancreatic cancer Cousin 42  ? Rectal cancer Neg Hx   ? Stomach cancer Neg Hx   ? ? ?Sherry Anderson has 3 sons, two are twins age 5 and her other son is 87. She also has 5 brothers and 4 sisters in their 41s-70s, none have had cancer. ?  ?Sherry Anderson's mother died at 51 with no cancer history. Patient had 4 maternal uncles and 1 aunt, no cancers. The daughter of one of her uncles died of pancreatic cancer at 42. Maternal grandparents passed over the age of 27. ?  ?Sherry Anderson's father died at 66. Patient had 2 paternal aunts, 5 uncles, no cancers. Paternal grandparents passed in their 89s-70s. ?  ?Sherry Anderson is unaware of previous family history of genetic testing for hereditary cancer risks. Patient's maternal ancestors are of [place] descent, and paternal ancestors are of [place] descent. There is no reported Ashkenazi Jewish ancestry. There is no known consanguinity. ?  ?  ? ?GENETIC TEST RESULTS: Genetic testing reported out on 11/11/2021 through the [facility] Multi-Cancer+RNA cancer panel found no pathogenic mutations.  ? ?The  Multi-Cancer Panel + RNA offered by [facility] includes sequencing and/or deletion duplication testing of the following 84 genes: AIP, ALK, APC, ATM, AXIN2,BAP1,  BARD1, BLM, BMPR1A, BRCA1, BRCA2, BRIP1, CASR, CDC73, CDH1, CDK4, CDKN1B, CDKN1C, CDKN2A (p14ARF), CDKN2A (p16INK4a), CEBPA, CHEK2, CTNNA1, DICER1, DIS3L2, EGFR (c.2369C>T, p.Thr790Met variant only), EPCAM (Deletion/duplication testing only), FH, FLCN, GATA2, GPC3, GREM1 (Promoter region deletion/duplication testing only), HOXB13 (c.251G>A, p.Gly84Glu), HRAS, KIT, MAX, MEN1, MET, MITF (c.952G>A, p.Glu318Lys variant only), MLH1, MSH2, MSH3, MSH6, MUTYH, NBN, NF1, NF2, NTHL1, PALB2, PDGFRA, PHOX2B, PMS2, POLD1, POLE, POT1, PRKAR1A, PTCH1, PTEN, RAD50, RAD51C, RAD51D, RB1, RECQL4, RET, RUNX1, SDHAF2, SDHA (sequence changes only), SDHB, SDHC, SDHD, SMAD4, SMARCA4, SMARCB1, SMARCE1, STK11, SUFU, TERC, TERT, TMEM127, TP53, TSC1, TSC2, VHL, WRN and WT1.  ? ?The test report has been scanned into EPIC and is located under the Molecular Pathology section of the Results Review tab.  A portion of the result report is included below for reference.  ? ? ? ?We discussed that because current genetic testing is not perfect, it is possible there may be a gene mutation in one of these genes that current testing cannot detect, but that chance is small.  There could be another gene that has not yet been discovered, or that we have not yet tested, that is responsible for the cancer diagnoses in the family. It is also possible there is a hereditary cause for the cancer in the family that Sherry Anderson did not inherit and therefore was not identified in her testing.  Therefore, it is important to remain in touch with cancer genetics in the future so that we can continue to offer Sherry Anderson the most up to date genetic testing.  ? ?Genetic testing did identify a variant of uncertain significance (VUS) in the ATM gene called c.668A>G.  At this time, it is unknown if this variant is  associated with increased cancer risk or if this is a normal finding, but most variants such as this get reclassified to being inconsequential. It should not be used to make medical management decisions. With time, we suspect the lab will determine the significance of this variant, if any. If we do learn more about it we will try to contact Sherry Anderson to discuss it further. However, it is important to stay in touch with [place] periodically and keep the address and phone number up to date. ? ?ADDITIONAL GENETIC TESTING: We discussed with Sherry Anderson that her genetic testing was fairly extensive.  If there are genes identified to increase cancer risk that can be analyzed in the future, we would be happy to discuss and coordinate this testing at that time.   ? ?CANCER SCREENING RECOMMENDATIONS: Sherry Anderson's test result is considered negative (normal).  This means that we have not identified a hereditary cause for her  personal and family history of cancer at this time. Most cancers happen by chance and this negative test suggests that her cancer may fall into this category.   ? ?While reassuring, this does not definitively rule out a hereditary predisposition to cancer. It is still possible that there could be genetic mutations that are undetectable by current technology. There could be genetic mutations in genes that have not been tested or identified to increase cancer risk.  Therefore, it is recommended she continue to follow the cancer management and screening guidelines provided by her oncology and primary healthcare provider.  ? ?An individual's cancer risk and medical management are not determined by genetic test results alone. Overall cancer risk assessment incorporates additional factors, including personal medical history, family history, and any available genetic information that may result in a personalized plan for cancer prevention and surveillance. ? ?RECOMMENDATIONS FOR FAMILY MEMBERS:  Relatives in  this family might be at some increased risk of developing cancer, over the general population risk, simply due to the family history of cancer.  We recommended female relatives in this family have a yearly mammogram beginning at age 57, or 54 years younger than the earliest onset of cancer, an annual clinical breast exam, and perform monthly breast self-exams. Female relatives in this family should also have a gynecological exam as recommended by their primary provider.  All family members should be referred for colonoscopy starting at age 68.  ? ? It is also possible there is a hereditary cause for the cancer in Sherry Anderson's family that she did not inherit and therefore was not identified in her.  Based on Sherry Anderson's family history, we recommended first degree relatives of her cousin who had pancreatic cancer have genetic counseling and testing. Ms. Surges will let us know if we can be of any assistance in coordinating genetic counseling and/or testing for these family members. ? ?FOLLOW-UP: Lastly, we discussed with Ms. Valentine that cancer genetics is a rapidly advancing field and it is possible that new genetic tests will be appropriate for her and/or her family members in the future. We encouraged her to remain in contact with cancer genetics on an annual basis so we can update her  personal and family histories and let her know of advances in cancer genetics that may benefit this family.  ? ?Our contact number was provided. Ms. Veley's questions were answered to her satisfaction, and she knows she is welcome to call us at anytime with additional questions or concerns.  ? ?Faith Rogue, MS, LCGC ?Genetic Counselor ?[email or web address] ?Phone: [phone] ? ?

## 2021-11-25 LAB — CMP14+EGFR
ALT: 24 IU/L (ref 0–32)
AST: 20 IU/L (ref 0–40)
Albumin/Globulin Ratio: 1.7 (ref 1.2–2.2)
Albumin: 4.6 g/dL (ref 3.8–4.8)
Alkaline Phosphatase: 84 IU/L (ref 44–121)
BUN/Creatinine Ratio: 12 (ref 12–28)
BUN: 10 mg/dL (ref 8–27)
Bilirubin Total: 0.3 mg/dL (ref 0.0–1.2)
CO2: 23 mmol/L (ref 20–29)
Calcium: 9.7 mg/dL (ref 8.7–10.3)
Chloride: 103 mmol/L (ref 96–106)
Creatinine, Ser: 0.86 mg/dL (ref 0.57–1.00)
Globulin, Total: 2.7 g/dL (ref 1.5–4.5)
Glucose: 94 mg/dL (ref 70–99)
Potassium: 4.6 mmol/L (ref 3.5–5.2)
Sodium: 140 mmol/L (ref 134–144)
Total Protein: 7.3 g/dL (ref 6.0–8.5)
eGFR: 77 mL/min/{1.73_m2} (ref >59)

## 2021-11-25 LAB — TSH: TSH: 2.61 u[IU]/mL (ref 0.450–4.500)

## 2021-11-29 NOTE — Assessment & Plan Note (Signed)
Update TSH today given fatigue ?

## 2021-11-29 NOTE — Assessment & Plan Note (Signed)
Overall doing relatively well. No SI/HI. Continue present medication. ?

## 2021-12-01 ENCOUNTER — Ambulatory Visit (HOSPITAL_COMMUNITY)
Admission: RE | Admit: 2021-12-01 | Discharge: 2021-12-01 | Disposition: A | Discharge status: Home / Self Care | Payer: Medicare HMO | Source: Ambulatory Visit | Attending: Family Medicine | Admitting: Family Medicine

## 2021-12-01 DIAGNOSIS — R1012 Left upper quadrant pain: Secondary | ICD-10-CM | POA: Insufficient documentation

## 2021-12-01 DIAGNOSIS — K760 Fatty (change of) liver, not elsewhere classified: Secondary | ICD-10-CM | POA: Diagnosis not present

## 2021-12-01 DIAGNOSIS — R188 Other ascites: Secondary | ICD-10-CM | POA: Diagnosis not present

## 2021-12-01 DIAGNOSIS — K5730 Diverticulosis of large intestine without perforation or abscess without bleeding: Secondary | ICD-10-CM | POA: Diagnosis not present

## 2021-12-01 MED ORDER — IOHEXOL 350 MG/ML SOLN
100.0000 mL | Freq: Once | INTRAVENOUS | Status: AC | PRN
  Administered 2021-12-01: 75 mL via INTRAVENOUS

## 2022-01-19 ENCOUNTER — Encounter: Payer: Self-pay | Admitting: *Deleted

## 2022-03-23 ENCOUNTER — Encounter: Payer: Self-pay | Admitting: Family Medicine

## 2022-03-23 ENCOUNTER — Other Ambulatory Visit: Payer: Self-pay

## 2022-03-23 ENCOUNTER — Ambulatory Visit (INDEPENDENT_AMBULATORY_CARE_PROVIDER_SITE_OTHER): Payer: Medicare HMO | Admitting: Family Medicine

## 2022-03-23 DIAGNOSIS — I10 Essential (primary) hypertension: Secondary | ICD-10-CM

## 2022-03-23 DIAGNOSIS — E785 Hyperlipidemia, unspecified: Secondary | ICD-10-CM | POA: Diagnosis not present

## 2022-03-23 DIAGNOSIS — F39 Unspecified mood [affective] disorder: Secondary | ICD-10-CM | POA: Diagnosis not present

## 2022-03-23 DIAGNOSIS — E038 Other specified hypothyroidism: Secondary | ICD-10-CM | POA: Diagnosis not present

## 2022-03-23 DIAGNOSIS — R69 Illness, unspecified: Secondary | ICD-10-CM | POA: Diagnosis not present

## 2022-03-23 MED ORDER — VENLAFAXINE HCL ER 75 MG PO CP24
ORAL_CAPSULE | ORAL | 1 refills | Status: DC
Start: 2022-03-23 — End: 2022-08-23

## 2022-03-23 MED ORDER — OMEPRAZOLE 20 MG PO CPDR: 20.0000 mg | DELAYED_RELEASE_CAPSULE | Freq: Two times a day (BID) | ORAL | 1 refills | Status: DC

## 2022-03-23 MED ORDER — LISINOPRIL 20 MG PO TABS: 20.0000 mg | ORAL_TABLET | Freq: Every day | ORAL | 3 refills | Status: DC

## 2022-03-23 MED ORDER — TRIAMTERENE-HCTZ 37.5-25 MG PO CAPS: ORAL_CAPSULE | ORAL | 3 refills | Status: DC

## 2022-03-23 MED ORDER — LEVOTHYROXINE SODIUM 75 MCG PO TABS
75.0000 ug | ORAL_TABLET | ORAL | 1 refills | Status: DC
Start: 2022-03-23 — End: 2023-02-14

## 2022-03-23 NOTE — Assessment & Plan Note (Signed)
Not on statin,   Will check cholesterol today  The 10-year ASCVD risk score (Arnett DK, et al., 2019) is: 4.7%   Values used to calculate the score:     Age: 61 years     Sex: Female     Is Non-Hispanic African American: No     Diabetic: No     Tobacco smoker: No     Systolic Blood Pressure: 208 mmHg     Is BP treated: Yes     HDL Cholesterol: 75 mg/dL     Total Cholesterol: 237 mg/dL   Stable - statin not indicated

## 2022-03-23 NOTE — Assessment & Plan Note (Signed)
Stable.   Lab Results  Component Value Date   TSH 2.610 11/24/2021   Continue current medications

## 2022-03-23 NOTE — Assessment & Plan Note (Signed)
Well controlled.  Refilled her medications

## 2022-03-23 NOTE — Progress Notes (Signed)
    SUBJECTIVE:   CHIEF COMPLAINT / HPI:   Will be visiting her son in [place] and sister in [place].  Not sure when returning.  Wants refills   LUQ pain: much improved.  CT in April unrevealing   BACK PAIN:  Continues unchanged.  Able to do most things she wants just hurts.  Takes tylenol .    HYPERTENSION, BENIGN ESSENTIAL No lightheadness or falls or edema.  Not checking at h ome   GERD (gastroesophageal reflux disease) Takes omeprazole 1-2 times a day or gets heartburn.  No bleeding    Mood disorder (HCC) Overall doing relatively well. No SI/HI. Takes effexor 3 per day     PERTINENT  PMH / PSH:  PE, Hypertension, anemia, mood   VP shunt  CMET TSH April 2023   Lab Results  Component Value Date   CHOL 199 02/05/2021   HDL 61 02/05/2021   LDLCALC 115 (H) 02/05/2021   LDLDIRECT 157 (H) 08/20/2008   TRIG 130 02/05/2021   CHOLHDL 3.3 02/05/2021    OBJECTIVE:   BP 123/75   Pulse 88   Wt 166 lb 9.6 oz (75.6 kg)   LMP  (LMP Unknown)   SpO2 98%   BMI 31.48 kg/m   Heart - Regular rate and rhythm.  No murmurs, gallops or rubs.    Lungs:  Normal respiratory effort, chest expands symmetrically. Lungs are clear to auscultation, no crackles or wheezes. Extremities:  No cyanosis, edema, or deformity noted with good range of motion of all major joints.   Abdomen: soft and non-tender without masses, organomegaly or hernias noted.  No guarding or rebound Mobility:able to get up and down from exam table without assistance or distress   ASSESSMENT/PLAN:   HYPERTENSION, BENIGN ESSENTIAL Well controlled.  Refilled her medications   Subclinical hypothyroidism Stable.   Lab Results  Component Value Date   TSH 2.610 11/24/2021   Continue current medications   Mood disorder (HCC) Stable Continue Effexor   Hyperlipidemia Not on statin,   Will check cholesterol today    Patient Instructions  Good to see you today - Thank you for coming in  Things we discussed  today:  I will call you if your tests are not good.  Otherwise, I will send you a message on MyChart (if it is active) or a letter in the mail..  If you do not hear from me with in 2 weeks please call our office.     I sent your refills   Have a great trip  Follow up with Dr Ardelia Mems in 6 months    Lind Covert, [place]

## 2022-03-23 NOTE — Patient Instructions (Signed)
Good to see you today - Thank you for coming in  Things we discussed today:  I will call you if your tests are not good.  Otherwise, I will send you a message on MyChart (if it is active) or a letter in the mail..  If you do not hear from me with in 2 weeks please call our office.     I sent your refills   Have a great trip  Follow up with Dr Ardelia Mems in 6 months

## 2022-03-23 NOTE — Assessment & Plan Note (Signed)
Stable.  Continue Effexor. 

## 2022-03-24 LAB — LIPID PANEL
Chol/HDL Ratio: 3.2 ratio (ref 0.0–4.4)
Cholesterol, Total: 237 mg/dL — ABNORMAL HIGH (ref 100–199)
HDL: 75 mg/dL (ref >39)
LDL Chol Calc (NIH): 134 mg/dL — ABNORMAL HIGH (ref 0–99)
Triglycerides: 158 mg/dL — ABNORMAL HIGH (ref 0–149)
VLDL Cholesterol Cal: 28 mg/dL (ref 5–40)

## 2022-07-12 ENCOUNTER — Ambulatory Visit (INDEPENDENT_AMBULATORY_CARE_PROVIDER_SITE_OTHER): Payer: Medicare HMO | Admitting: Family Medicine

## 2022-07-12 ENCOUNTER — Encounter: Payer: Self-pay | Admitting: Family Medicine

## 2022-07-12 VITALS — BP 115/78 | HR 88 | Temp 99.0°F | Wt 168.0 lb

## 2022-07-12 DIAGNOSIS — J069 Acute upper respiratory infection, unspecified: Secondary | ICD-10-CM | POA: Diagnosis not present

## 2022-07-12 NOTE — Assessment & Plan Note (Signed)
Low suspicion for bacterial infection given symptoms ongoing for less than 7 days.  Afebrile today.  Sick contacts within the household.  No red flag symptoms including CP/SOB, severe vomiting/diarrhea.  Exam reassuring, appears well-hydrated.  Continue conservative management, consider follow-up if symptoms do not improve after another week

## 2022-07-12 NOTE — Patient Instructions (Signed)
It was wonderful to see you today.  Please bring ALL of your medications with you to every visit.   Updates from today's visit:  Please continue over-the-counter remedies for your cold  Please give [place] a call if symptoms do not improve after another week or so  You can try honey water for your cough  Please follow up as needed  Thank you for choosing [facility].   Please call [phone] with any questions about today's appointment.  Please be sure to schedule follow up at the front  desk before you leave today.   August Albino, MD  Family Medicine

## 2022-07-12 NOTE — Progress Notes (Signed)
  SUBJECTIVE:   CHIEF COMPLAINT / HPI:   Cough, congestion, sore throat -ongoing x1 week since Tues 11/21 -chills, body aches, sweats, bad cough productive of yellow sputum. Has not checked temp but believes she had fevers -Feels sinus pressure when coughing -was sick a few months ago and her cough never went away -Has tried mucinex, robitussin, ginger tea -Son has been sick too, she may have gotten it from him -No diarrhea, vomiting. Occasional wheezing but no SOB, CP    OBJECTIVE:  BP 115/78   Pulse 88   Temp 99 F (37.2 C) (Oral)   Wt 168 lb (76.2 kg)   LMP  (LMP Unknown)   SpO2 99%   BMI 31.74 kg/m   General: NAD, pleasant, able to participate in exam HEENT: Mild pharyngeal erythema noted, no exudate or abscess or ulcers noted in oral mucosa Cardiac: RRR, no murmurs auscultated Respiratory: CTAB, normal WOB Abdomen: soft, non-tender, non-distended, normoactive bowel sounds Extremities: warm and well perfused, no edema or cyanosis   ASSESSMENT/PLAN:   Viral URI Assessment & Plan: Low suspicion for bacterial infection given symptoms ongoing for less than 7 days.  Afebrile today.  Sick contacts within the household.  No red flag symptoms including CP/SOB, severe vomiting/diarrhea.  Exam reassuring, appears well-hydrated.  Continue conservative management, consider follow-up if symptoms do not improve after another week     Return if symptoms worsen or fail to improve.  August Albino, MD [facility]

## 2022-07-22 ENCOUNTER — Ambulatory Visit: Payer: Medicare HMO

## 2022-08-20 ENCOUNTER — Ambulatory Visit: Payer: Medicare HMO | Admitting: Family Medicine

## 2022-08-22 ENCOUNTER — Other Ambulatory Visit: Payer: Self-pay | Admitting: Family Medicine

## 2022-08-22 DIAGNOSIS — F39 Unspecified mood [affective] disorder: Secondary | ICD-10-CM

## 2022-08-23 NOTE — Telephone Encounter (Signed)
Appt scheduled for 08/30/22. Christen Bame, CMA

## 2022-08-23 NOTE — Telephone Encounter (Signed)
Please let patient know I am refilling this medication, but she needs to schedule an appointment with me.   Thanks, Marckus Hanover J Tanazia Achee, MD  

## 2022-08-26 ENCOUNTER — Encounter: Payer: Self-pay | Admitting: Family Medicine

## 2022-08-30 ENCOUNTER — Ambulatory Visit (INDEPENDENT_AMBULATORY_CARE_PROVIDER_SITE_OTHER): Payer: Medicare HMO | Admitting: Family Medicine

## 2022-08-30 ENCOUNTER — Encounter: Payer: Self-pay | Admitting: Family Medicine

## 2022-08-30 VITALS — BP 115/80 | HR 91 | Temp 98.3°F | Wt 168.0 lb

## 2022-08-30 DIAGNOSIS — K219 Gastro-esophageal reflux disease without esophagitis: Secondary | ICD-10-CM

## 2022-08-30 DIAGNOSIS — R69 Illness, unspecified: Secondary | ICD-10-CM | POA: Diagnosis not present

## 2022-08-30 DIAGNOSIS — Z23 Encounter for immunization: Secondary | ICD-10-CM

## 2022-08-30 DIAGNOSIS — R7303 Prediabetes: Secondary | ICD-10-CM | POA: Diagnosis not present

## 2022-08-30 DIAGNOSIS — E038 Other specified hypothyroidism: Secondary | ICD-10-CM | POA: Diagnosis not present

## 2022-08-30 DIAGNOSIS — I10 Essential (primary) hypertension: Secondary | ICD-10-CM | POA: Diagnosis not present

## 2022-08-30 DIAGNOSIS — F39 Unspecified mood [affective] disorder: Secondary | ICD-10-CM

## 2022-08-30 LAB — POCT GLYCOSYLATED HEMOGLOBIN (HGB A1C): Hemoglobin A1C: 5.6 % (ref 4.0–5.6)

## 2022-08-30 NOTE — Assessment & Plan Note (Signed)
Well-controlled on omeprazole 20 mg twice a day.  Continue this medication.

## 2022-08-30 NOTE — Patient Instructions (Signed)
It was great to see you again today.  Checking lab work today: Kidneys, thyroid, and A1c COVID booster given today Follow-up in 6 months, sooner if needed  Be well, Dr. Ardelia Mems

## 2022-08-30 NOTE — Assessment & Plan Note (Signed)
Well-controlled, continue current regimen.  Update BMET today.

## 2022-08-30 NOTE — Assessment & Plan Note (Signed)
Update A1c today 

## 2022-08-30 NOTE — Progress Notes (Signed)
  Date of Visit: 08/30/2022   SUBJECTIVE:   HPI:  Rosie presents today for routine follow-up.  Subclinical hypothyroidism: Currently taking levothyroxine 75 mcg daily.  Hypertension: Currently taking lisinopril 20 mg daily and triamterene-HCTZ 37.5-25 mg daily.  Tolerating these well.  GERD: Currently taking omeprazole 20 mg twice daily.  Tolerating this well.  Symptoms are well-controlled with this regimen.  Does note this flaring if she eats anything spicy.  Depression: Currently taking venlafaxine XR 225 mg daily.  Tolerating this well.  Reports mood is doing quite well.  She visited her sister in [place] for 3 months and had a wonderful time, and now her sister has returned to [place] with her and is staying with her.  This companionship has been wonderful for her.  Has had a mild intermittent cough since returning from [place].  She had a febrile illness, thinks she had the flu at that time.  Has had intermittent cough afterwards.  No fevers currently.  Overall improving.  OBJECTIVE:   BP 115/80   Pulse 91   Temp 98.3 F (36.8 C)   Wt 168 lb (76.2 kg)   LMP  (LMP Unknown)   SpO2 98%   BMI 31.74 kg/m  Gen: no acute distress, pleasant, cooperative, well appearing HEENT: normocephalic, atraumatic  Heart: regular rate and rhythm, no murmur Lungs: clear to auscultation bilaterally, normal work of breathing  Neuro: alert, grossly nonfocal, speech normal Ext: No appreciable lower extremity edema bilaterally  Psych: normal range of affect, well groomed, speech normal in rate and volume, normal eye contact   ASSESSMENT/PLAN:   Health maintenance:  -Has already received flu shot -COVID booster given today -Interested in annual wellness visit, will get her added to the list to be contacted  Subclinical hypothyroidism Update TSH today.  Continue levothyroxine.  HYPERTENSION, BENIGN ESSENTIAL Well-controlled, continue current regimen.  Update BMET  today.  Mood disorder ([place]) Doing very well on venlafaxine XR 225 mg daily.  Continue this medication.  Follow-up in 6 months.  GERD (gastroesophageal reflux disease) Well-controlled on omeprazole 20 mg twice a day.  Continue this medication.  Prediabetes Update A1c today.  Cough Identified at end of visit, postviral.  Lungs are clear today.  Patient well-appearing.  No workup at this time, continue to monitor.  FOLLOW UP: Follow up in 6 months for above issues  [place] J. Ardelia Mems, [place]

## 2022-08-30 NOTE — Assessment & Plan Note (Signed)
Doing very well on venlafaxine XR 225 mg daily.  Continue this medication.  Follow-up in 6 months.

## 2022-08-30 NOTE — Assessment & Plan Note (Signed)
Update TSH today.  Continue levothyroxine.

## 2022-08-31 LAB — BASIC METABOLIC PANEL
BUN/Creatinine Ratio: 14 (ref 12–28)
BUN: 15 mg/dL (ref 8–27)
CO2: 23 mmol/L (ref 20–29)
Calcium: 9.6 mg/dL (ref 8.7–10.3)
Chloride: 102 mmol/L (ref 96–106)
Creatinine, Ser: 1.04 mg/dL — ABNORMAL HIGH (ref 0.57–1.00)
Glucose: 104 mg/dL — ABNORMAL HIGH (ref 70–99)
Potassium: 4.2 mmol/L (ref 3.5–5.2)
Sodium: 141 mmol/L (ref 134–144)
eGFR: 61 mL/min/{1.73_m2} (ref >59)

## 2022-08-31 LAB — TSH: TSH: 2.39 u[IU]/mL (ref 0.450–4.500)

## 2022-09-02 ENCOUNTER — Other Ambulatory Visit (HOSPITAL_COMMUNITY)
Admission: RE | Admit: 2022-09-02 | Discharge: 2022-09-02 | Disposition: A | Discharge status: Home / Self Care | Payer: Medicare HMO | Source: Ambulatory Visit | Attending: Obstetrics & Gynecology | Admitting: Obstetrics & Gynecology

## 2022-09-02 ENCOUNTER — Encounter: Payer: Self-pay | Admitting: Obstetrics & Gynecology

## 2022-09-02 ENCOUNTER — Ambulatory Visit (INDEPENDENT_AMBULATORY_CARE_PROVIDER_SITE_OTHER): Payer: Medicare HMO | Admitting: Obstetrics & Gynecology

## 2022-09-02 VITALS — BP 110/76 | HR 87 | Ht 61.75 in | Wt 167.0 lb

## 2022-09-02 DIAGNOSIS — Z9189 Other specified personal risk factors, not elsewhere classified: Secondary | ICD-10-CM | POA: Diagnosis not present

## 2022-09-02 DIAGNOSIS — M8589 Other specified disorders of bone density and structure, multiple sites: Secondary | ICD-10-CM

## 2022-09-02 DIAGNOSIS — Z01419 Encounter for gynecological examination (general) (routine) without abnormal findings: Secondary | ICD-10-CM | POA: Insufficient documentation

## 2022-09-02 DIAGNOSIS — Z90722 Acquired absence of ovaries, bilateral: Secondary | ICD-10-CM | POA: Diagnosis not present

## 2022-09-02 DIAGNOSIS — Z780 Asymptomatic menopausal state: Secondary | ICD-10-CM

## 2022-09-02 DIAGNOSIS — N644 Mastodynia: Secondary | ICD-10-CM | POA: Diagnosis not present

## 2022-09-02 DIAGNOSIS — D069 Carcinoma in situ of cervix, unspecified: Secondary | ICD-10-CM

## 2022-09-02 DIAGNOSIS — Z9289 Personal history of other medical treatment: Secondary | ICD-10-CM

## 2022-09-02 DIAGNOSIS — Z90710 Acquired absence of both cervix and uterus: Secondary | ICD-10-CM

## 2022-09-02 DIAGNOSIS — Z1272 Encounter for screening for malignant neoplasm of vagina: Secondary | ICD-10-CM | POA: Diagnosis not present

## 2022-09-02 DIAGNOSIS — Z9079 Acquired absence of other genital organ(s): Secondary | ICD-10-CM

## 2022-09-02 NOTE — Progress Notes (Signed)
Sherry Anderson 1961/03/16 [id number]   History:    62 y.o.  G2P2L3   RP: Established patient presenting for annual gyn exam    HPI: S/P XI Robotic TLH/BSO in 09/2017 for AIS.  No pelvic pain anymore.  Last Pap Neg 08/2021.  Pap reflex today.  Urine/BMs normal.  Lt Breast normal. Rt Breast tenderness at 11-12 O'Clock. Screen Mammo Neg 08/2021. BMI 30.79.  [place] 01/2021.  BD Osteopenia Lt Fem Neck T-Score -2.1 in 09/2020.  Repeat BD 09/2022. Health labs with Fam MD. Flu vaccine done at pharmacy.   Past medical history,surgical history, family history and social history were all reviewed and documented in the EPIC chart.  Gynecologic History No LMP recorded (lmp unknown). Patient has had a hysterectomy.  Obstetric History OB History  Gravida Para Term Preterm AB Living  '2 2 1 1   3  '$ SAB IAB Ectopic Multiple Live Births        1      # Outcome Date GA Lbr Len/2nd Weight Sex Delivery Anes PTL Lv  2A Preterm           2B Preterm           1 Term              ROS: A ROS was performed and pertinent positives and negatives are included in the history. GENERAL: No fevers or chills. HEENT: No change in vision, no earache, sore throat or sinus congestion. NECK: No pain or stiffness. CARDIOVASCULAR: No chest pain or pressure. No palpitations. PULMONARY: No shortness of breath, cough or wheeze. GASTROINTESTINAL: No abdominal pain, nausea, vomiting or diarrhea, melena or bright red blood per rectum. GENITOURINARY: No urinary frequency, urgency, hesitancy or dysuria. MUSCULOSKELETAL: No joint or muscle pain, no back pain, no recent trauma. DERMATOLOGIC: No rash, no itching, no lesions. ENDOCRINE: No polyuria, polydipsia, no heat or cold intolerance. No recent change in weight. HEMATOLOGICAL: No anemia or easy bruising or bleeding. NEUROLOGIC: No headache, seizures, numbness, tingling or weakness. PSYCHIATRIC: No depression, no loss of interest in normal activity or change in sleep pattern.      Exam:   BP 110/76   Pulse 87   Ht 5' 1.75" (1.568 m)   Wt 167 lb (75.8 kg)   LMP  (LMP Unknown) Comment: not sexually active  SpO2 99%   BMI 30.79 kg/m   Body mass index is 30.79 kg/m.  General appearance : Well developed well nourished female. No acute distress HEENT: Eyes: no retinal hemorrhage or exudates,  Neck supple, trachea midline, no carotid bruits, no thyroidmegaly Lungs: Clear to auscultation, no rhonchi or wheezes, or rib retractions  Heart: Regular rate and rhythm, no murmurs or gallops Breast:Examined in sitting and supine position were symmetrical in appearance, no palpable masses. Right breast tenderness at 11-12 O'Clock.  No skin retraction, no nipple inversion, no nipple discharge, no skin discoloration, no axillary or supraclavicular lymphadenopathy Abdomen: no palpable masses or tenderness, no rebound or guarding Extremities: no edema or skin discoloration or tenderness  Pelvic: Vulva: Normal             Vagina: No gross lesions or discharge.  Pap reflex done.  Cervix/Uterus absent  Adnexa  Without masses or tenderness  Anus: Normal   Assessment/Plan:  62 y.o. female for annual exam   1. Encounter for Papanicolaou smear of vagina as part of routine gynecological examination S/P XI Robotic TLH/BSO in 09/2017 for AIS.  No pelvic pain anymore.  Last Pap Neg 08/2021.  Pap reflex today.  Urine/BMs normal.  Lt Breast normal. Rt Breast tenderness at 11-12 O'Clock. Screen Mammo Neg 08/2021. BMI 30.79.  [place] 01/2021.  BD Osteopenia Lt Fem Neck T-Score -2.1 in 09/2020.  Repeat BD 09/2022. Health labs with Fam MD. Flu vaccine done at pharmacy.  - Cytology - PAP( [facility])  2. Adenocarcinoma in situ (AIS) of uterine cervix - Cytology - PAP( [facility])  3. S/P total hysterectomy and BSO (bilateral salpingo-oophorectomy)  4. Postmenopause S/P XI Robotic TLH/BSO in 09/2017 for AIS.  No pelvic pain anymore.   5. Breast pain, right Right breast tenderness at 11-12  O'Clock.  Will schedule a Rt Dx mammo/US and Left screening mammo.  6. Osteopenia of multiple sites BD Osteopenia Lt Fem Neck T-Score -2.1 in 09/2020.  Repeat BD 09/2022.   7. Personal history of other medical treatment   Princess Bruins MD, 1:37 PM

## 2022-09-03 ENCOUNTER — Telehealth: Payer: Self-pay

## 2022-09-03 DIAGNOSIS — N644 Mastodynia: Secondary | ICD-10-CM

## 2022-09-03 NOTE — Telephone Encounter (Signed)
-----  Message from Princess Bruins, MD sent at 09/02/2022  2:00 PM EST ----- Regarding: Refer for Rt Dx mammo/US and Lt screen mammo Right breast tenderness at 11-12 O'Clock.  Will schedule a Rt Dx mammo/US and Left screening mammo.

## 2022-09-03 NOTE — Telephone Encounter (Signed)
FYI. Scheduled @ 11:10 on 09/07/22. Pt notified and voiced understanding.

## 2022-09-07 ENCOUNTER — Ambulatory Visit
Admission: RE | Admit: 2022-09-07 | Discharge: 2022-09-07 | Disposition: A | Discharge status: Home / Self Care | Payer: Medicare HMO | Source: Ambulatory Visit | Attending: Obstetrics & Gynecology | Admitting: Obstetrics & Gynecology

## 2022-09-07 DIAGNOSIS — N644 Mastodynia: Secondary | ICD-10-CM | POA: Diagnosis not present

## 2022-09-07 DIAGNOSIS — Z853 Personal history of malignant neoplasm of breast: Secondary | ICD-10-CM | POA: Diagnosis not present

## 2022-09-09 LAB — CYTOLOGY - PAP: Diagnosis: NEGATIVE

## 2022-09-21 DIAGNOSIS — Z982 Presence of cerebrospinal fluid drainage device: Secondary | ICD-10-CM | POA: Diagnosis not present

## 2022-09-21 DIAGNOSIS — G500 Trigeminal neuralgia: Secondary | ICD-10-CM | POA: Diagnosis not present

## 2022-09-21 DIAGNOSIS — D169 Benign neoplasm of bone and articular cartilage, unspecified: Secondary | ICD-10-CM | POA: Diagnosis not present

## 2022-09-21 DIAGNOSIS — I671 Cerebral aneurysm, nonruptured: Secondary | ICD-10-CM | POA: Diagnosis not present

## 2022-09-23 ENCOUNTER — Other Ambulatory Visit: Payer: Self-pay | Admitting: Family Medicine

## 2022-09-23 DIAGNOSIS — I10 Essential (primary) hypertension: Secondary | ICD-10-CM

## 2022-09-23 DIAGNOSIS — F39 Unspecified mood [affective] disorder: Secondary | ICD-10-CM

## 2022-09-29 DIAGNOSIS — Z982 Presence of cerebrospinal fluid drainage device: Secondary | ICD-10-CM | POA: Diagnosis not present

## 2022-09-29 DIAGNOSIS — I671 Cerebral aneurysm, nonruptured: Secondary | ICD-10-CM | POA: Diagnosis not present

## 2022-10-06 ENCOUNTER — Telehealth: Payer: Self-pay | Admitting: Family Medicine

## 2022-10-06 NOTE — Telephone Encounter (Signed)
Contacted Theophilus Kinds to schedule their annual wellness visit. Appointment made for 10/08/2022 at 8:30AM.  Holli Humbles

## 2022-10-07 NOTE — Patient Instructions (Signed)

## 2022-10-07 NOTE — Progress Notes (Signed)
I connected with  Theophilus Kinds on 10/08/2022 by a audio enabled telemedicine application and verified that I am speaking with the correct person using two identifiers.  Patient Location: Home  Provider Location: Office/Clinic  I discussed the limitations of evaluation and management by telemedicine. The patient expressed understanding and agreed to proceed.  Subjective:   Sherry Anderson is a 62 y.o. female who presents for an Initial Medicare Annual Wellness Visit.  Review of Systems    Per HPI unless specifically indicated below.  Cardiac Risk Factors include: advanced age (>68mn, >>59women);female gender          Objective:       09/02/2022    1:17 PM 08/30/2022    9:38 AM 07/12/2022    2:48 PM  Vitals with BMI  Height 5' 1.75"    Weight 167 lbs 168 lbs 168 lbs  BMI [id number]   Systolic [id number] Diastolic 76 80 78  Pulse 87 91 88    Today's Vitals   10/08/22 0848  PainSc: 7    There is no height or weight on file to calculate BMI.     10/08/2022    8:54 AM 07/12/2022    2:47 PM 03/23/2022    8:54 AM 11/24/2021    8:27 AM 09/29/2021    8:36 AM 12/11/2020   11:27 AM 10/14/2020    2:23 PM  Advanced Directives  Does Patient Have a Medical Advance Directive? No No No No No No No  Would patient like information on creating a medical advance directive? No - Patient declined No - Patient declined No - Patient declined No - Patient declined No - Patient declined No - Patient declined No - Patient declined    Current Medications (verified) Outpatient Encounter Medications as of 10/08/2022  Medication Sig   acetaminophen (TYLENOL) 325 MG tablet Take 650 mg by mouth every 6 (six) hours as needed. OTC PRN   Calcium Citrate-Vitamin D (CALCIUM + D PO) Take 1 tablet by mouth daily.   Cyanocobalamin (B-12 PO) Take by mouth.   ferrous sulfate 325 (65 FE) MG tablet Take 325 mg by mouth 2 (two) times daily with a meal.   levothyroxine (SYNTHROID) 75 MCG tablet Take  1 tablet (75 mcg total) by mouth every morning. 30 minutes before food   lisinopril (ZESTRIL) 20 MG tablet Take 1 tablet (20 mg total) by mouth daily.   omeprazole (PRILOSEC) 20 MG capsule Take 1 capsule (20 mg total) by mouth 2 (two) times daily.   Probiotic CHEW Chew by mouth.   triamterene-hydrochlorothiazide (DYAZIDE) 37.5-25 MG capsule TAKE 1 CAPSULE BY MOUTH EVERY DAY   venlafaxine XR (EFFEXOR-XR) 75 MG 24 hr capsule TAKE THREE CAPSULES BY MOUTH ONCE DAILY   No facility-administered encounter medications on file as of 10/08/2022.    Allergies (verified) Patient has no known allergies.   History: 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   Associated with high-grade dysplasia   Anxiety and depression    Breast cancer ([facility] 2002   left breast cancer    Depression    Diverticulosis of colon    DVT (deep venous thrombosis) ([facility] 2021   provoked by surgery   Essential hypertension, benign    Family history of pancreatic cancer    GERD (gastroesophageal reflux disease)    Hepatitis    unsure of which type   Hiatal hernia  High grade squamous intraepithelial cervical dysplasia 05/2017   Associated with adenocarcinoma in situ   History of adenomatous polyp of colon    tubular adenoma's  02-08-2017   History of gastritis    History of left breast cancer 2002---- per pt no recurrence   dx DCIS --- s/p  left breast lumpectomy;  per pt radiation therapy completed same year and completed antiestrogen therapy    History of radiation therapy    2002   left breast   History of trigeminal neuralgia    Hypothyroidism    Internal hemorrhoids    Pain    right ear and jaw   Personal history of radiation therapy    Spinal stenosis    Spondylolisthesis    SVD (spontaneous vaginal delivery)    x 1   Tubular adenoma of colon    Past Surgical History:  Procedure Laterality Date   BREAST LUMPECTOMY Left 2002   ductal CA in situ    CERVICAL CONIZATION W/BX N/A 06/08/2017   Procedure: CONIZATION CERVIX WITH BIOPSY;  Surgeon: Anastasio Auerbach, MD;  Location: [facility];  Service: Gynecology;  Laterality: N/A;   CESAREAN SECTION  1990   twins   COLONOSCOPY  02-08-2017   dr Karleen Hampshire   CRANIECTOMY     shunt   DILATATION & CURETTAGE/HYSTEROSCOPY WITH MYOSURE N/A 06/08/2017   Procedure: [place];  Surgeon: Anastasio Auerbach, MD;  Location: [facility];  Service: Gynecology;  Laterality: N/A;   ESOPHAGOGASTRODUODENOSCOPY  last one 11-24-2016   dr Karleen Hampshire   ROBOTIC ASSISTED TOTAL HYSTERECTOMY WITH BILATERAL SALPINGO OOPHERECTOMY N/A 09/21/2017   Procedure: XI ROBOTIC ASSISTED TOTAL HYSTERECTOMY WITH BILATERAL SALPINGO OOPHORECTOMY, Lysis of Adhesions;  Surgeon: Princess Bruins, MD;  Location: [facility];  Service: Gynecology;  Laterality: N/A;   TOOTH EXTRACTION     TRANSFORAMINAL LUMBAR INTERBODY FUSION (TLIF) WITH PEDICLE SCREW FIXATION 2 LEVEL N/A 04/13/2018   Procedure: RIGHT-SIDED LUMBAR 4-5 AND LUMBAR 5-SACRUM 1 TRANSFORAMINAL LUMBAR INTERBODY FUSION WITH INSTRUMENTATION AND ALLOGRAFT;  Surgeon: Phylliss Bob, MD;  Location: [facility];  Service: Orthopedics;  Laterality: N/A;   WISDOM TOOTH EXTRACTION     Family History  Problem Relation Age of Onset   Diabetes Mother    Hypertension Mother    Hyperlipidemia Father    Heart disease Father        Pacemaker   Dementia Father    Thyroid disease Sister    Schizophrenia Brother    Pancreatic cancer Cousin 42   Social History   Socioeconomic History   Marital status: Legally Separated    Spouse name: Not on file   Number of children: 3   Years of education: 6   Highest education level: 6th grade  Occupational History   Occupation: Disability  Tobacco Use   Smoking status: Never   Smokeless tobacco: Never  Vaping Use   Vaping Use: Never used  Substance and Sexual Activity   Alcohol use:  No   Drug use: No   Sexual activity: Not Currently    Partners: Male    Birth control/protection: Surgical    Comment: 1st intercourse 62 yo-Fewer than 5 partners, hysterectomy  Other Topics Concern   Not on file  Social History Narrative   Moved from [place] to [place], to [place] at home with her nephew and her son.   Right-handed.   Occasional use of caffeine.   Social Determinants of Health  Financial Resource Strain: Low Risk  (10/08/2022)   Overall Financial Resource Strain (CARDIA)    Difficulty of Paying Living Expenses: Not hard at all  Food Insecurity: No Food Insecurity (10/08/2022)   Hunger Vital Sign    Worried About Running Out of Food in the Last Year: Never true    Ran Out of Food in the Last Year: Never true  Transportation Needs: No Transportation Needs (10/08/2022)   PRAPARE - Hydrologist (Medical): No    Lack of Transportation (Non-Medical): No  Physical Activity: Inactive (10/08/2022)   Exercise Vital Sign    Days of Exercise per Week: 0 days    Minutes of Exercise per Session: 0 min  Stress: No Stress Concern Present (10/08/2022)   [place]    Feeling of Stress : Not at all  Social Connections: Moderately Isolated (10/08/2022)   Social Connection and Isolation Panel [NHANES]    Frequency of Communication with Friends and Family: More than three times a week    Frequency of Social Gatherings with Friends and Family: Once a week    Attends Religious Services: 1 to 4 times per year    Active Member of [employer] or Organizations: No    Attends Music therapist: Never    Marital Status: Separated    Tobacco Counseling Counseling given: No   Clinical Intake:  Pre-visit preparation completed: No  Pain : 0-10 Pain Score: 7  Pain Type: Chronic pain Pain Location: Back Pain Orientation: Upper, Right, Left Pain Descriptors /  Indicators: Aching, Constant Pain Onset: More than a month ago Pain Frequency: Constant     Nutritional Status: BMI of 19-24  Normal Nutritional Risks: None  How often do you need to have someone help you when you read instructions, pamphlets, or other written materials from your doctor or pharmacy?: 1 - Never  Diabetic?No  Interpreter Needed?: No  Information entered by :: Artis Delay, CMA   Activities of Daily Living    10/08/2022    8:44 AM  In your present state of health, do you have any difficulty performing the following activities:  Hearing? 0  Vision? 1  Difficulty concentrating or making decisions? 1  Walking or climbing stairs? 1  Dressing or bathing? 1  Doing errands, shopping? 1    Patient Care Team: Leeanne Rio, MD as PCP - General (Family Medicine) Pieter Partridge, DO as Consulting Physician (Neurology) Lane Hacker, [facility] as Pharmacist (Pharmacist) Princess Bruins, MD as Consulting Physician (Obstetrics and Gynecology)  Indicate any recent Medical Services you may have received from other than [facility] providers in the past year (date may be approximate).     Assessment:   This is a routine wellness examination for Sherry Anderson.  Hearing/Vision screen Denies any hearing issues. Denies any vision changes. Annual Eye Exam    Dietary issues and exercise activities discussed: Current Exercise Habits: The patient does not participate in regular exercise at present, Exercise limited by: orthopedic condition(s)   Goals Addressed   None    Depression Screen    10/08/2022    8:42 AM 07/12/2022    2:48 PM 03/23/2022    9:00 AM 11/24/2021    9:08 AM 09/29/2021    8:37 AM 06/11/2021    9:40 AM 02/05/2021    8:45 AM  PHQ 2/9 Scores  PHQ - 2 Score 1 0 '3 4 3 4 3  '$ PHQ- 9 Score  $'2 10 12 5 9 11    'R$ Fall Risk    10/08/2022    8:43 AM 09/02/2022    1:25 PM 03/23/2022    8:54 AM 11/24/2021    8:27 AM 12/11/2020   11:27 AM  Fall Risk   Falls in the past  year? 0 0 0 0 0  Number falls in past yr: 0 0 0 0 0  Injury with Fall? 0 0 0 0 0  Risk for fall due to : No Fall Risks No Fall Risks     Follow up Falls evaluation completed Falls evaluation completed       FALL RISK PREVENTION PERTAINING TO THE HOME:  Any stairs in or around the home? Yes  If so, are there any without handrails? No  Home free of loose throw rugs in walkways, pet beds, electrical cords, etc? Yes  Adequate lighting in your home to reduce risk of falls? Yes   ASSISTIVE DEVICES UTILIZED TO PREVENT FALLS:  Life alert? No  Use of a cane, walker or w/c? No  Grab bars in the bathroom? No  Shower chair or bench in shower? Yes  Elevated toilet seat or a handicapped toilet? No   TIMED UP AND GO:  Was the test performed? Unable to perform, virtual appointment   Cognitive Function:        10/08/2022    8:46 AM  6CIT Screen  What Year? 0 points  What month? 0 points  What time? 0 points  Count back from 20 0 points  Months in reverse 0 points  Repeat phrase 10 points  Total Score 10 points    Immunizations Immunization History  Administered Date(s) Administered   COVID-19, mRNA, vaccine(Comirnaty)12 years and older 08/30/2022   Influenza Split 10/24/2012   Influenza Whole 07/18/2007, 05/29/2008   Influenza,inj,Quad PF,6+ Mos 06/18/2014, 05/20/2017, 07/19/2018, 08/23/2019, 05/12/2020, 06/11/2021   Influenza-Unspecified 06/16/2022   PFIZER Comirnaty(Gray Top)Covid-19 Tri-Sucrose Vaccine 12/11/2020   PFIZER(Purple Top)SARS-COV-2 Vaccination 11/03/2019, 11/24/2019, 07/08/2020   Pfizer Covid-19 Vaccine Bivalent Booster 44yr & up 06/11/2021   Td 11/14/2005   Tdap 01/11/2017   Zoster Recombinat (Shingrix) 05/12/2020, 03/02/2021    TDAP status: Up to date  Flu Vaccine status: Up to date  Pneumococcal vaccine status: Up to date  Covid-19 vaccine status: Completed vaccines  Qualifies for Shingles Vaccine? Yes   Zostavax completed Yes   Shingrix Completed?:  Yes  Screening Tests Health Maintenance  Topic Date Due   COVID-19 Vaccine (7 - 2023-24 season) 10/25/2022   Medicare Annual Wellness (AWV)  10/09/2023   MAMMOGRAM  09/07/2024   PAP SMEAR-Modifier  09/02/2025   COLONOSCOPY (Pts 45-429yrInsurance coverage will need to be confirmed)  01/14/2026   DTaP/Tdap/Td (3 - Td or Tdap) 01/12/2027   INFLUENZA VACCINE  Completed   Hepatitis C Screening  Completed   HIV Screening  Completed   Zoster Vaccines- Shingrix  Completed   HPV VACCINES  Aged Out    Health Maintenance  There are no preventive care reminders to display for this patient.   Colorectal cancer screening: Type of screening: Colonoscopy. Completed 01/14/2021. Repeat every 5 years  Mammogram status: Completed 09/07/2022. Repeat every year  DEXA Scan: ordered 09/02/2022  Lung Cancer Screening: (Low Dose CT Chest recommended if Age 63-80ears, 30 pack-year currently smoking OR have quit w/in 15years.) does not qualify.   Lung Cancer Screening Referral: not applicable   Additional Screening:  Hepatitis C Screening: does qualify; Completed 07/27/2016  Vision Screening: Recommended  annual ophthalmology exams for early detection of glaucoma and other disorders of the eye. Is the patient up to date with their annual eye exam?  Yes  Who is the provider or what is the name of the office in which the patient attends annual eye exams?  If pt is not established with a provider, would they like to be referred to a provider to establish care? No .   Dental Screening: Recommended annual dental exams for proper oral hygiene  Community Resource Referral / Chronic Care Management: CRR required this visit?  No   CCM required this visit?  No      Plan:     I have personally reviewed and noted the following in the patient's chart:   Medical and social history Use of alcohol, tobacco or illicit drugs  Current medications and supplements including opioid prescriptions. Patient  is not currently taking opioid prescriptions. Functional ability and status Nutritional status Physical activity Advanced directives List of other physicians Hospitalizations, surgeries, and ER visits in previous 12 months Vitals Screenings to include cognitive, depression, and falls Referrals and appointments  In addition, I have reviewed and discussed with patient certain preventive protocols, quality metrics, and best practice recommendations. A written personalized care plan for preventive services as well as general preventive health recommendations were provided to patient.    Sherry Anderson , Thank you for taking time to come for your Medicare Wellness Visit. I appreciate your ongoing commitment to your health goals. Please review the following plan we discussed and let me know if I can assist you in the future.   These are the goals we discussed:  Goals      Mental health needs     Current Barriers:  Chronic Mental Health needs related to depression. Social Isolation Lacks knowledge of community resource: Patient was given a list by her PCP of community health providers but she does not know where to start with identifying the appropriate provider.  Suicidal Ideation/Homicidal Ideation: No  Clinical Social Work Goal(s):  Over the next 60 days, patient will work with SW monthly by telephone or in person to reduce or manage symptoms related to depression. Over the next 60 days, patient will work with BSW to address concerns related to scheduling appointments for therapy and medication management.  Interventions: Patient interviewed and appropriate assessments performed: PHQ 2 and PHQ 9 Patient interviewed and appropriate assessments performed Provided mental health counseling with regard to depression and the effects of trauma   Provided patient with information about the connection between mental health and physical health. Collaborated with RN Case Manager re: patient's  mental health needs. Collaborated with BSW re: scheduling patient for therapy at [facility] [phone], [phone]. Emotional/Supportive Counseling Referred to psychiatrist Referred to counselor/psychotherapist  Patient Self Care Activities:  Self administers medications as prescribed Attends all scheduled provider appointments Calls pharmacy for medication refills Performs ADL's independently Performs IADL's independently Ability for insight Independent living Motivation for treatment  Patient Coping Strengths:  Spirituality Hopefulness Self Advocate Able to Communicate Effectively  Patient Self Care Deficits:  Lacks social connections         This is a list of the screening recommended for you and due dates:  Health Maintenance  Topic Date Due   COVID-19 Vaccine (7 - 2023-24 season) 10/25/2022   Medicare Annual Wellness Visit  10/09/2023   Mammogram  09/07/2024   Pap Smear  09/02/2025   Colon Cancer Screening  01/14/2026   DTaP/Tdap/Td vaccine (3 - Td or Tdap) 01/12/2027   Flu Shot  Completed   Hepatitis C Screening: USPSTF Recommendation to screen - Ages 18-79 yo.  Completed   HIV Screening  Completed   Zoster (Shingles) Vaccine  Completed   HPV Vaccine  Aged [address] [place]   10/08/2022  Nurse Notes: Approximately 30 minute Non-Face -To-Face Medicare Wellness Visit

## 2022-10-08 ENCOUNTER — Ambulatory Visit (INDEPENDENT_AMBULATORY_CARE_PROVIDER_SITE_OTHER): Payer: Medicare HMO

## 2022-10-08 ENCOUNTER — Ambulatory Visit (INDEPENDENT_AMBULATORY_CARE_PROVIDER_SITE_OTHER): Payer: Medicare HMO | Admitting: Student

## 2022-10-08 VITALS — BP 116/68 | HR 75 | Ht 61.7 in | Wt 167.0 lb

## 2022-10-08 DIAGNOSIS — R1011 Right upper quadrant pain: Secondary | ICD-10-CM | POA: Diagnosis not present

## 2022-10-08 DIAGNOSIS — J069 Acute upper respiratory infection, unspecified: Secondary | ICD-10-CM

## 2022-10-08 DIAGNOSIS — Z0000 Encounter for general adult medical examination without abnormal findings: Secondary | ICD-10-CM | POA: Diagnosis not present

## 2022-10-08 MED ORDER — BENZONATATE 100 MG PO CAPS: 100.0000 mg | ORAL_CAPSULE | Freq: Two times a day (BID) | ORAL | 0 refills | Status: DC | PRN

## 2022-10-08 NOTE — Patient Instructions (Addendum)
It was wonderful to meet you today. Thank you for allowing me to be a part of your care. Below is a short summary of what we discussed at your visit today:  With your fatigue and muscle ache I you have a viral illness.  Please sure that you stay well-hydrated and if symptoms does not improve in 7 days please call to be seen.   Your abdominal pain could be due to gastritis or associated with  your current viral illness given the timeframe.  However I will obtain labs today to check your liver.   Please bring all of your medications to every appointment!  If you have any questions or concerns, please do not hesitate to contact us via phone or MyChart message.   Alen Bleacher, MD [facility]

## 2022-10-08 NOTE — Progress Notes (Signed)
    SUBJECTIVE:   CHIEF COMPLAINT / HPI:   62 year old female presenting today for concerns of fatigue and feeling ill.  Per patient her thumb started about 3-4 days ago where she started having more fatigue than usual and body ache mostly in the shoulders and back area.  She has been having general fatigue for the past 1 to 2 years. Denies having any fevers or chills or headaches.  No recent sick contacts.  Does have history of hypothyroidism currently on Synthroid. Her TSH most recently was within normal limits.  RUQ pain Patient reports having pain that started on the left of the abdomen and since moved to the right.  Pain started about 3-4 days ago.  No known aggravating factors.  Improves the pain and she describes the pain as a dull constant pain associated with feeding.  No fever, chills, or blood/dark stool.  Patient reports history of heartburn stable on daily omeprazole.   PERTINENT  PMH / PSH: Reviewed  OBJECTIVE:   BP 116/68   Pulse 75   Ht 5' 1.7" (1.567 m)   Wt 167 lb (75.8 kg)   LMP  (LMP Unknown) Comment: not sexually active  SpO2 96%   BMI 30.84 kg/m    Physical Exam General: Alert, well appearing, NAD HEENT: MMM, PERRL, no oropharyngeal erythema or cervical lymphadenopathy Cardiovascular: RRR, No Murmurs, Normal S2/S2 Respiratory: CTAB, No wheezing or Rales Abdomen: No distension or tenderness   ASSESSMENT/PLAN:   Viral URI Suspect patient's fatigue and body ache is suspicious of viral URI.  Low concern for hypothyroidism given most recent TSH was within normal limits.  Discussed conservative management with patient and encouraged adequate hydration.  Can use Tylenol or ibuprofen for pain or fever.  RUQ abdominal Continue course of patient's abdominal pain. Return provide differentials include possible musculoskeletal or headache from viral illness given onset of symptoms.  It is considered in differentials include gastritis, keratitis, peptic ulcer, or  hepatic etiology.  Will start initial workup with liver function test and discussed return precautions with patient.  Alen Bleacher, MD [facility]

## 2022-10-09 LAB — COMPREHENSIVE METABOLIC PANEL
ALT: 14 IU/L (ref 0–32)
AST: 18 IU/L (ref 0–40)
Albumin/Globulin Ratio: 1.3 (ref 1.2–2.2)
Albumin: 4.7 g/dL (ref 3.9–4.9)
Alkaline Phosphatase: 88 IU/L (ref 44–121)
BUN/Creatinine Ratio: 14 (ref 12–28)
BUN: 14 mg/dL (ref 8–27)
Bilirubin Total: 0.2 mg/dL (ref 0.0–1.2)
CO2: 20 mmol/L (ref 20–29)
Calcium: 9.9 mg/dL (ref 8.7–10.3)
Chloride: 100 mmol/L (ref 96–106)
Creatinine, Ser: 1.01 mg/dL — ABNORMAL HIGH (ref 0.57–1.00)
Globulin, Total: 3.5 g/dL (ref 1.5–4.5)
Glucose: 120 mg/dL — ABNORMAL HIGH (ref 70–99)
Potassium: 4.2 mmol/L (ref 3.5–5.2)
Sodium: 138 mmol/L (ref 134–144)
Total Protein: 8.2 g/dL (ref 6.0–8.5)
eGFR: 63 mL/min/{1.73_m2} (ref >59)

## 2022-10-14 DIAGNOSIS — K760 Fatty (change of) liver, not elsewhere classified: Secondary | ICD-10-CM | POA: Diagnosis not present

## 2022-10-14 DIAGNOSIS — M8580 Other specified disorders of bone density and structure, unspecified site: Secondary | ICD-10-CM | POA: Diagnosis not present

## 2022-10-14 DIAGNOSIS — Z008 Encounter for other general examination: Secondary | ICD-10-CM | POA: Diagnosis not present

## 2022-10-14 DIAGNOSIS — I10 Essential (primary) hypertension: Secondary | ICD-10-CM | POA: Diagnosis not present

## 2022-10-14 DIAGNOSIS — Z8249 Family history of ischemic heart disease and other diseases of the circulatory system: Secondary | ICD-10-CM | POA: Diagnosis not present

## 2022-10-14 DIAGNOSIS — M1990 Unspecified osteoarthritis, unspecified site: Secondary | ICD-10-CM | POA: Diagnosis not present

## 2022-10-14 DIAGNOSIS — R69 Illness, unspecified: Secondary | ICD-10-CM | POA: Diagnosis not present

## 2022-10-14 DIAGNOSIS — Z791 Long term (current) use of non-steroidal anti-inflammatories (NSAID): Secondary | ICD-10-CM | POA: Diagnosis not present

## 2022-10-14 DIAGNOSIS — E669 Obesity, unspecified: Secondary | ICD-10-CM | POA: Diagnosis not present

## 2022-10-14 DIAGNOSIS — N393 Stress incontinence (female) (male): Secondary | ICD-10-CM | POA: Diagnosis not present

## 2022-10-14 DIAGNOSIS — K219 Gastro-esophageal reflux disease without esophagitis: Secondary | ICD-10-CM | POA: Diagnosis not present

## 2022-10-14 DIAGNOSIS — E785 Hyperlipidemia, unspecified: Secondary | ICD-10-CM | POA: Diagnosis not present

## 2022-10-14 DIAGNOSIS — Z6830 Body mass index (BMI) 30.0-30.9, adult: Secondary | ICD-10-CM | POA: Diagnosis not present

## 2023-01-02 ENCOUNTER — Other Ambulatory Visit: Payer: Self-pay | Admitting: Family Medicine

## 2023-02-10 ENCOUNTER — Other Ambulatory Visit: Payer: Self-pay | Admitting: Family Medicine

## 2023-04-11 ENCOUNTER — Other Ambulatory Visit: Payer: Self-pay | Admitting: Family Medicine

## 2023-04-11 DIAGNOSIS — I10 Essential (primary) hypertension: Secondary | ICD-10-CM

## 2023-04-15 ENCOUNTER — Emergency Department (HOSPITAL_COMMUNITY)
Admission: EM | Admit: 2023-04-15 | Discharge: 2023-04-15 | Disposition: A | Discharge status: Home / Self Care | Payer: Medicare HMO | Attending: Emergency Medicine | Admitting: Emergency Medicine

## 2023-04-15 ENCOUNTER — Other Ambulatory Visit: Payer: Self-pay

## 2023-04-15 ENCOUNTER — Emergency Department (HOSPITAL_COMMUNITY): Payer: Medicare HMO

## 2023-04-15 DIAGNOSIS — R0789 Other chest pain: Secondary | ICD-10-CM | POA: Diagnosis not present

## 2023-04-15 DIAGNOSIS — R7989 Other specified abnormal findings of blood chemistry: Secondary | ICD-10-CM

## 2023-04-15 DIAGNOSIS — E041 Nontoxic single thyroid nodule: Secondary | ICD-10-CM | POA: Diagnosis not present

## 2023-04-15 DIAGNOSIS — R945 Abnormal results of liver function studies: Secondary | ICD-10-CM | POA: Diagnosis not present

## 2023-04-15 DIAGNOSIS — Z853 Personal history of malignant neoplasm of breast: Secondary | ICD-10-CM | POA: Diagnosis not present

## 2023-04-15 DIAGNOSIS — Z79899 Other long term (current) drug therapy: Secondary | ICD-10-CM | POA: Diagnosis not present

## 2023-04-15 DIAGNOSIS — I10 Essential (primary) hypertension: Secondary | ICD-10-CM | POA: Insufficient documentation

## 2023-04-15 DIAGNOSIS — R079 Chest pain, unspecified: Secondary | ICD-10-CM | POA: Diagnosis not present

## 2023-04-15 DIAGNOSIS — K449 Diaphragmatic hernia without obstruction or gangrene: Secondary | ICD-10-CM | POA: Diagnosis not present

## 2023-04-15 DIAGNOSIS — K760 Fatty (change of) liver, not elsewhere classified: Secondary | ICD-10-CM | POA: Insufficient documentation

## 2023-04-15 LAB — CBC
HCT: 35.9 % — ABNORMAL LOW (ref 36.0–46.0)
Hemoglobin: 11 g/dL — ABNORMAL LOW (ref 12.0–15.0)
MCH: 25.3 pg — ABNORMAL LOW (ref 26.0–34.0)
MCHC: 30.6 g/dL (ref 30.0–36.0)
MCV: 82.5 fL (ref 80.0–100.0)
Platelets: 305 10*3/uL (ref 150–400)
RBC: 4.35 MIL/uL (ref 3.87–5.11)
RDW: 15.8 % — ABNORMAL HIGH (ref 11.5–15.5)
WBC: 5.5 10*3/uL (ref 4.0–10.5)
nRBC: 0 % (ref 0.0–0.2)

## 2023-04-15 LAB — BASIC METABOLIC PANEL WITH GFR
Anion gap: 10 (ref 5–15)
BUN: 18 mg/dL (ref 8–23)
CO2: 24 mmol/L (ref 22–32)
Calcium: 9.3 mg/dL (ref 8.9–10.3)
Chloride: 102 mmol/L (ref 98–111)
Creatinine, Ser: 0.89 mg/dL (ref 0.44–1.00)
GFR, Estimated: >60 mL/min
Glucose, Bld: 142 mg/dL — ABNORMAL HIGH (ref 70–99)
Potassium: 3.5 mmol/L (ref 3.5–5.1)
Sodium: 136 mmol/L (ref 135–145)

## 2023-04-15 LAB — TROPONIN I (HIGH SENSITIVITY)
Troponin I (High Sensitivity): 5 ng/L (ref <18)
Troponin I (High Sensitivity): 5 ng/L (ref <18)

## 2023-04-15 LAB — HEPATIC FUNCTION PANEL
ALT: 58 U/L — ABNORMAL HIGH (ref 0–44)
AST: 103 U/L — ABNORMAL HIGH (ref 15–41)
Albumin: 3.2 g/dL — ABNORMAL LOW (ref 3.5–5.0)
Alkaline Phosphatase: 71 U/L (ref 38–126)
Bilirubin, Direct: 0.2 mg/dL (ref 0.0–0.2)
Indirect Bilirubin: 0.5 mg/dL (ref 0.3–0.9)
Total Bilirubin: 0.7 mg/dL (ref 0.3–1.2)
Total Protein: 6.4 g/dL — ABNORMAL LOW (ref 6.5–8.1)

## 2023-04-15 MED ORDER — ONDANSETRON HCL 4 MG/2ML IJ SOLN
4.0000 mg | Freq: Once | INTRAMUSCULAR | Status: AC
  Administered 2023-04-15: 4 mg via INTRAVENOUS
  Filled 2023-04-15: qty 2

## 2023-04-15 MED ORDER — IOHEXOL 350 MG/ML SOLN
75.0000 mL | Freq: Once | INTRAVENOUS | Status: AC | PRN
  Administered 2023-04-15: 75 mL via INTRAVENOUS

## 2023-04-15 MED ORDER — MORPHINE SULFATE (PF) 4 MG/ML IV SOLN
4.0000 mg | Freq: Once | INTRAVENOUS | Status: AC
  Administered 2023-04-15: 4 mg via INTRAVENOUS
  Filled 2023-04-15: qty 1

## 2023-04-15 NOTE — ED Triage Notes (Signed)
Patient arrives with pain that she states started out as abdominal pain that is now chest pain with radiation through to mid-back. States constant since onset. Endorses nausea and diaphoresis.

## 2023-04-15 NOTE — ED Provider Notes (Signed)
[place] EMERGENCY DEPARTMENT AT [facility] Provider Note   CSN: [id number] Arrival date & time: 04/15/23  1914     History  Chief Complaint  Patient presents with   Chest Pain    Sherry Anderson is a 62 y.o. female with past medical history anxiety, depression, hypertension, breast cancer in remission, thyroid disease, DVT who presents to the ED complaining of abrupt onset severe chest pain that woke her from her sleep at 5 AM this morning.  States that it is a very severe pain that started around the epigastric region and is now radiating up into her chest and through her abdomen into her back.  Associated with nausea as well as diaphoresis onset.  No lightheadedness, dizziness, focal weakness, vision changes, dyspnea, fever.  No history of the symptoms.  No personal history of heart problems.  She does have a history of DVT, not currently on anticoagulants.  Family history of arrhythmia but no heart disease.  No associated palpitations.      Home Medications Prior to Admission medications   Medication Sig Start Date End Date Taking? Authorizing Provider  acetaminophen (TYLENOL) 325 MG tablet Take 650 mg by mouth every 6 (six) hours as needed. OTC PRN   Yes [provider]  Calcium Citrate-Vitamin D (CALCIUM + D PO) Take 1 tablet by mouth daily.   Yes [provider]  Cyanocobalamin (B-12 PO) Take by mouth.   Yes [provider]  ferrous sulfate 325 (65 FE) MG tablet Take 325 mg by mouth 2 (two) times daily with a meal.   Yes [provider]  levothyroxine (SYNTHROID) 75 MCG tablet TAKE 1 TABLET (75 MCG TOTAL) BY MOUTH EVERY MORNING. 30 MINUTES BEFORE FOOD 02/14/23  Yes Sherry Dodrill, MD  lisinopril (ZESTRIL) 20 MG tablet TAKE 1 TABLET BY MOUTH EVERY DAY 04/13/23  Yes Sherry Dodrill, MD  omeprazole (PRILOSEC) 20 MG capsule TAKE 1 CAPSULE BY MOUTH TWICE A DAY 01/04/23  Yes Sherry Dodrill, MD  Probiotic CHEW Chew by  mouth.   Yes [provider]  triamterene-hydrochlorothiazide (DYAZIDE) 37.5-25 MG capsule TAKE 1 CAPSULE BY MOUTH EVERY DAY 09/24/22  Yes Sherry Dodrill, MD  venlafaxine XR (EFFEXOR-XR) 75 MG 24 hr capsule TAKE THREE CAPSULES BY MOUTH ONCE DAILY 09/24/22  Yes Sherry Dodrill, MD      Allergies    Patient has no known allergies.    Review of Systems   Review of Systems  All other systems reviewed and are negative.   Physical Exam Updated Vital Signs BP 133/67   Pulse 67   Temp 98 F (36.7 C) (Oral)   Resp 17   Ht 5\' 3"  (1.6 m)   Wt 76.2 kg   LMP  (LMP Unknown) Comment: not sexually active  SpO2 100%   BMI 29.76 kg/m  Physical Exam Vitals and nursing note reviewed.  Constitutional:      General: She is not in acute distress.    Appearance: Normal appearance. She is not ill-appearing or toxic-appearing.  HENT:     Head: Normocephalic and atraumatic.     Mouth/Throat:     Mouth: Mucous membranes are moist.  Eyes:     Conjunctiva/sclera: Conjunctivae normal.  Neck:     Vascular: No JVD.  Cardiovascular:     Rate and Rhythm: Normal rate and regular rhythm.     Heart sounds: No murmur heard. Pulmonary:     Effort: Pulmonary effort is normal. No  tachypnea, accessory muscle usage or respiratory distress.     Breath sounds: Normal breath sounds. No stridor. No decreased breath sounds, wheezing, rhonchi or rales.  Abdominal:     General: Abdomen is flat.     Palpations: Abdomen is soft.     Tenderness: There is no abdominal tenderness.  Musculoskeletal:        General: Normal range of motion.     Cervical back: Normal range of motion and neck supple.     Right lower leg: No tenderness. No edema.     Left lower leg: No tenderness. No edema.  Skin:    General: Skin is warm and dry.     Capillary Refill: Capillary refill takes less than 2 seconds.  Neurological:     General: No focal deficit present.     Mental Status: She is alert and oriented to person,  place, and time. Mental status is at baseline.     GCS: GCS eye subscore is 4. GCS verbal subscore is 5. GCS motor subscore is 6.  Psychiatric:        Mood and Affect: Mood is anxious.     ED Results / Procedures / Treatments   Labs (all labs ordered are listed, but only abnormal results are displayed) Labs Reviewed  BASIC METABOLIC PANEL - Abnormal; Notable for the following components:      Result Value   Glucose, Bld 142 (*)    All other components within normal limits  CBC - Abnormal; Notable for the following components:   Hemoglobin 11.0 (*)    HCT 35.9 (*)    MCH 25.3 (*)    RDW 15.8 (*)    All other components within normal limits  HEPATIC FUNCTION PANEL - Abnormal; Notable for the following components:   Total Protein 6.4 (*)    Albumin 3.2 (*)    AST 103 (*)    ALT 58 (*)    All other components within normal limits  TROPONIN I (HIGH SENSITIVITY)  TROPONIN I (HIGH SENSITIVITY)    EKG EKG Interpretation Date/Time:  Friday April 15 2023 08:30:21 EDT Ventricular Rate:  55 PR Interval:  160 QRS Duration:  88 QT Interval:  458 QTC Calculation: 438 R Axis:   11  Text Interpretation: Sinus bradycardia Otherwise normal ECG When compared with ECG of 23-Apr-2020 09:35, PREVIOUS ECG IS PRESENT Confirmed by Kristine Royal [phone]) on 04/15/2023 9:48:57 AM  Radiology CT Angio Chest Aorta W and/or Wo Contrast  Result Date: 04/15/2023 CLINICAL DATA:  Chest pain. EXAM: CT ANGIOGRAPHY CHEST WITH CONTRAST TECHNIQUE: Multidetector CT imaging of the chest was performed using the standard protocol during bolus administration of intravenous contrast. Multiplanar CT image reconstructions and MIPs were obtained to evaluate the vascular anatomy. RADIATION DOSE REDUCTION: This exam was performed according to the departmental dose-optimization program which includes automated exposure control, adjustment of the mA and/or kV according to patient size and/or use of iterative reconstruction  technique. CONTRAST:  75mL OMNIPAQUE IOHEXOL 350 MG/ML SOLN COMPARISON:  June 03, 2020. FINDINGS: Cardiovascular: Satisfactory opacification of the pulmonary arteries to the segmental level. No evidence of pulmonary embolism. Normal heart size. No pericardial effusion. Mediastinum/Nodes: Large sliding-type hiatal hernia. 18 mm left thyroid nodule is noted. No mediastinal adenopathy is noted. Lungs/Pleura: Lungs are clear. No pleural effusion or pneumothorax. Upper Abdomen: Hepatic steatosis. Musculoskeletal: No chest wall abnormality. No acute or significant osseous findings. Review of the MIP images confirms the above findings. IMPRESSION: No definite evidence of pulmonary embolus.  Large sliding-type hiatal hernia. 18 mm left thyroid nodule. Recommend thyroid US. (Ref: J Am Coll Radiol. 2015 Feb;12(2): 143-50). Hepatic steatosis. Electronically Signed   By: Lupita Raider M.D.   On: 04/15/2023 11:46   DG Chest 2 View  Result Date: 04/15/2023 CLINICAL DATA:  Chest pain. EXAM: CHEST - 2 VIEW COMPARISON:  01/07/2018. FINDINGS: Bilateral lung fields are clear. Bilateral costophrenic angles are clear. Normal cardio-mediastinal silhouette. Redemonstration of moderate retrocardiac air-fluid level, compatible with moderate sized hiatal hernia. No acute osseous abnormalities. The soft tissues are within normal limits. Tubing noted in the left paramedian paraspinal region from presumed ventriculoperitoneal shunt. IMPRESSION: 1. No active cardiopulmonary disease. 2. Moderate sized hiatal hernia. Electronically Signed   By: Jules Schick M.D.   On: 04/15/2023 09:01    Procedures Procedures    Medications Ordered in ED Medications  morphine (PF) 4 MG/ML injection 4 mg (4 mg Intravenous Given 04/15/23 0930)  ondansetron (ZOFRAN) injection 4 mg (4 mg Intravenous Given 04/15/23 0929)  iohexol (OMNIPAQUE) 350 MG/ML injection 75 mL (75 mLs Intravenous Contrast Given 04/15/23 1130)    ED Course/ Medical Decision  Making/ A&P Clinical Course as of 04/15/23 1355  Fri Apr 15, 2023  4332 Patient has left arm restriction thus unable to measure blood pressures in both arms. [MG]    Clinical Course User Index [MG] Tonette Lederer, PA-C                                Medical Decision Making Amount and/or Complexity of Data Reviewed Labs: ordered. Decision-making details documented in ED Course. Radiology: ordered. Decision-making details documented in ED Course. ECG/medicine tests: ordered. Decision-making details documented in ED Course.  Risk Prescription drug management.   Medical Decision Making:   SUKHJIT ERK is a 62 y.o. female who presented to the ED today with chest pain detailed above.    Patient's presentation is complicated by their history of hypertension, history of breast cancer.  Patient placed on continuous vitals and telemetry monitoring while in ED which was reviewed periodically.  Complete initial physical exam performed, notably the patient  was in no acute distress.  Regular rate and rhythm, lungs clear to auscultation, no respiratory distress.  Anxious but neurologically intact.  No lower extremity edema.  No calf tenderness.    Reviewed and confirmed nursing documentation for past medical history, family history, social history.    Initial Assessment:   With the patient's presentation of chest pain, the emergent differential diagnosis of chest pain includes: Acute coronary syndrome, pericarditis, aortic dissection, pulmonary embolism, tension pneumothorax, and esophageal rupture. Other urgent/non-acute considerations include, but are not limited to: chronic angina, aortic stenosis, cardiomyopathy, myocarditis, mitral valve prolapse, pulmonary hypertension, hypertrophic obstructive cardiomyopathy (HOCM), aortic insufficiency, right ventricular hypertrophy, pneumonia, pleuritis, bronchitis, pneumothorax, tumor, gastroesophageal reflux disease (GERD), esophageal spasm,  Mallory-Weiss syndrome, peptic ulcer disease, biliary disease, pancreatitis, functional gastrointestinal pain, cervical or thoracic disk disease or arthritis, shoulder arthritis, costochondritis, subacromial bursitis, anxiety or panic attack, herpes zoster, breast disorders, chest wall tumors, thoracic outlet syndrome, mediastinitis.    Initial Plan:  Screening labs including CBC and Metabolic panel to evaluate for infectious or metabolic etiology of disease.  CXR to evaluate for structural/infectious intrathoracic pathology.  EKG and troponin to evaluate for cardiac pathology CT chest to assess for dissection Objective evaluation as reviewed   Initial Study Results:   Laboratory  All laboratory results reviewed without  evidence of clinically relevant pathology.   Exceptions include: Albumin 3.2, AST 103, ALT 58, hemoglobin 11  EKG EKG was reviewed independently. ST segments without concerns for elevations.   EKG: sinus bradycardia.  Otherwise normal EKG.  Radiology:  All images reviewed independently. Agree with radiology report at this time.   CT Angio Chest Aorta W and/or Wo Contrast  Result Date: 04/15/2023 CLINICAL DATA:  Chest pain. EXAM: CT ANGIOGRAPHY CHEST WITH CONTRAST TECHNIQUE: Multidetector CT imaging of the chest was performed using the standard protocol during bolus administration of intravenous contrast. Multiplanar CT image reconstructions and MIPs were obtained to evaluate the vascular anatomy. RADIATION DOSE REDUCTION: This exam was performed according to the departmental dose-optimization program which includes automated exposure control, adjustment of the mA and/or kV according to patient size and/or use of iterative reconstruction technique. CONTRAST:  75mL OMNIPAQUE IOHEXOL 350 MG/ML SOLN COMPARISON:  June 03, 2020. FINDINGS: Cardiovascular: Satisfactory opacification of the pulmonary arteries to the segmental level. No evidence of pulmonary embolism. Normal heart  size. No pericardial effusion. Mediastinum/Nodes: Large sliding-type hiatal hernia. 18 mm left thyroid nodule is noted. No mediastinal adenopathy is noted. Lungs/Pleura: Lungs are clear. No pleural effusion or pneumothorax. Upper Abdomen: Hepatic steatosis. Musculoskeletal: No chest wall abnormality. No acute or significant osseous findings. Review of the MIP images confirms the above findings. IMPRESSION: No definite evidence of pulmonary embolus. Large sliding-type hiatal hernia. 18 mm left thyroid nodule. Recommend thyroid US. (Ref: J Am Coll Radiol. 2015 Feb;12(2): 143-50). Hepatic steatosis. Electronically Signed   By: Lupita Raider M.D.   On: 04/15/2023 11:46   DG Chest 2 View  Result Date: 04/15/2023 CLINICAL DATA:  Chest pain. EXAM: CHEST - 2 VIEW COMPARISON:  01/07/2018. FINDINGS: Bilateral lung fields are clear. Bilateral costophrenic angles are clear. Normal cardio-mediastinal silhouette. Redemonstration of moderate retrocardiac air-fluid level, compatible with moderate sized hiatal hernia. No acute osseous abnormalities. The soft tissues are within normal limits. Tubing noted in the left paramedian paraspinal region from presumed ventriculoperitoneal shunt. IMPRESSION: 1. No active cardiopulmonary disease. 2. Moderate sized hiatal hernia. Electronically Signed   By: Jules Schick M.D.   On: 04/15/2023 09:01      Final Assessment and Plan:   62 year old female presents to the ED with abrupt onset of chest pain this morning.  Vital signs stable on arrival.  Patient appears very anxious on initial assessment.  Regular rate and rhythm, lungs clear to auscultation, no respiratory distress.  No lower extremity edema.  No pulsatile masses on abdominal exam.  Patient notes that pain was sudden and severe in nature and is now radiating through to her back.  She does have a history of previous breast cancer currently in remission.  She has no restriction on the left arm.  Unable to obtain bilateral  blood pressures in the upper extremities.  No history of heart disease.  Does not appear volume overloaded.  Workup initiated as above for further assessment.  Troponin normal x 2.  She has a sinus bradycardia that is mild but no acute changes on EKG.  Chest x-ray without acute cardiopulmonary disease. With description of pain, CTA chest ordered to rule out dissection, PE.  CT of the chest does not show any emergent causes of patient's chest pain.  Troponin normal x 2.  She does have incidental findings of a thyroid nodule and hepatic steatosis.  She has minimal elevations in her LFTs.  It appears that these have been normal in the past.  Abdomen  soft and nontender.  Patient's pain has resolved after 1 dose of pain medication and she has remained stable throughout ED stay.  Normal bilirubin, no leukocytosis, no nausea or vomiting.  Negative Murphy's sign. Low suspicion for acute cholecystitis.  With resolution of patient's symptoms and otherwise reassuring workup for chest pain, discussed findings with patient and family members at bedside.  Discussed importance of close follow-up with PCP for thyroid ultrasound as well as recheck of LFTs and patient agreeable.  With presentation of severe chest pain today and no previous cardiac workup, will refer to cardiology for outpatient evaluation.  Patient agreeable with this plan.  She will return for any acutely worsening symptoms. Strict ED return precautions given, all questions answered, and stable for discharge.    Clinical Impression:  1. Atypical chest pain   2. Thyroid nodule   3. Elevated LFTs   4. Hepatic steatosis      Discharge       Final Clinical Impression(s) / ED Diagnoses Final diagnoses:  Thyroid nodule  Elevated LFTs  Hepatic steatosis  Atypical chest pain    Rx / DC Orders ED Discharge Orders          Ordered    Ambulatory referral to Cardiology        04/15/23 [address] 04/15/23  1401    Wynetta Fines, MD 04/15/23 1606

## 2023-04-15 NOTE — ED Notes (Addendum)
Trinna Post (pts nephew) phone number if needing to contact [phone], was at bedside & had to leave while she was in CT.

## 2023-04-15 NOTE — Discharge Instructions (Addendum)
Thank you for letting us take care of you today.  Your heart enzymes were normal.  We did not see any infection, clots, or signs of fluid overload such as heart failure on your chest x-ray or CT scan.  Your vital signs are reassuring.  There were some incidental findings that we discussed.  Your liver function is slightly elevated.  On the CT scan, we see a condition called hepatic steatosis or fatty liver disease.  I have attached information about this.  You should have your liver function rechecked by your primary to be sure that this normalizes.  Your previous liver function has been normal.  You should avoid fatty or highly processed foods as well as alcohol until cleared by your primary.  If your liver function remains elevated, they may want you to see a GI specialist or do additional testing.   There is also an incidental nodule on your thyroid.  This is likely related to your history of thyroid disease.  Our radiologist recommended an ultrasound of the thyroid.  Discuss with your PCP ordering this for further evaluation.  With your chest pain today, I am referring you to cardiology.  They should call you within 72 hours to schedule a follow-up appointment.  If you do not hear from them, call their office to schedule this appointment at your own convenience.  If you develop new or worsening symptoms, return to the nearest ED for reevaluation.

## 2023-04-19 ENCOUNTER — Other Ambulatory Visit: Payer: Self-pay

## 2023-04-19 ENCOUNTER — Ambulatory Visit (INDEPENDENT_AMBULATORY_CARE_PROVIDER_SITE_OTHER): Payer: Medicare HMO | Admitting: Family Medicine

## 2023-04-19 VITALS — BP 121/77 | HR 103 | Ht 63.0 in | Wt 174.0 lb

## 2023-04-19 DIAGNOSIS — E041 Nontoxic single thyroid nodule: Secondary | ICD-10-CM

## 2023-04-19 DIAGNOSIS — K760 Fatty (change of) liver, not elsewhere classified: Secondary | ICD-10-CM | POA: Diagnosis not present

## 2023-04-19 DIAGNOSIS — R1010 Upper abdominal pain, unspecified: Secondary | ICD-10-CM | POA: Diagnosis not present

## 2023-04-19 DIAGNOSIS — K449 Diaphragmatic hernia without obstruction or gangrene: Secondary | ICD-10-CM | POA: Diagnosis not present

## 2023-04-19 DIAGNOSIS — R011 Cardiac murmur, unspecified: Secondary | ICD-10-CM | POA: Diagnosis not present

## 2023-04-19 DIAGNOSIS — D649 Anemia, unspecified: Secondary | ICD-10-CM

## 2023-04-19 MED ORDER — PANTOPRAZOLE SODIUM 40 MG PO TBEC: 40.0000 mg | DELAYED_RELEASE_TABLET | Freq: Every day | ORAL | 3 refills | Status: DC

## 2023-04-19 NOTE — Assessment & Plan Note (Signed)
Will order thyroid ultrasound for further evaluation.  Of note, patient is on Synthroid 75 mcg daily.

## 2023-04-19 NOTE — Patient Instructions (Addendum)
Take pantoprazole 40 mg ONCE daily instead of the omeprazole. I have ordered an echocardiogram and ultrasound of your thyroid. We will help you schedule these and call you. I have ordered lab work and will update you on the results. I have referred you to cardiology. Come back in 2-3 weeks to see how you are doing.

## 2023-04-19 NOTE — Assessment & Plan Note (Signed)
Noted incidentally on CTA.  LFTs mildly elevated at that time as well.  Will repeat hepatic function panel today.

## 2023-04-19 NOTE — Assessment & Plan Note (Signed)
Improved since ED visit on its own.  Reassured by abdominal exam today.  Consider gastritis, H. pylori, GERD, sliding hiatal hernia (as seen on CTA) as cause of pain.  Less likely cardiopulmonary source of pain given workup in ED; however, patient does have a readily apparent systolic murmur on my exam.  Given stability, we will proceed with outpatient echocardiogram and referral to cardiology for further evaluation.  Additionally, will switch omeprazole to pantoprazole 40 mg daily and test for H. pylori given possibility of reinfection by family members.  Follow-up in 2 to 3 weeks to assess improvement.

## 2023-04-19 NOTE — Progress Notes (Signed)
    SUBJECTIVE:   CHIEF COMPLAINT / HPI:   ED follow up Was seen on 04/15/23 for chest pain. ACS and PE ruled out. Did find incidental thyroid nodule and mildly elevated LFTs with evidence of hepatic steatosis on CTA. Referred to cardiology at that time for further evaluation.  Today, patient's pain is much better.  However, it is now in the left upper quadrant.  It does not tend to get worse after fatty meals or with lying down.  However, the episode that precipitated the ED visit and occurred while she was laying in bed that morning.  She has a history of H. pylori for which she was treated, though her family was not treated, and she is unsure if they are infected.  She has also been taking omeprazole twice daily.  Has been nauseous but no vomiting.  Has also felt hot with chills but no documented fever.  No bowel changes.  PERTINENT  PMH / PSH: gastritis, GERD, family history of pancreatic cancer  OBJECTIVE:   BP 121/77   Pulse (!) 103   Ht 5\' 3"  (1.6 m)   Wt 174 lb (78.9 kg)   LMP  (LMP Unknown) Comment: not sexually active  SpO2 97%   BMI 30.82 kg/m   General: Alert and oriented, in NAD Skin: Warm, dry, and intact without lesions HEENT: NCAT, EOM grossly normal, midline nasal septum Cardiac: RRR, 3/6 systolic murmur best appreciated at left upper sternal border Respiratory: CTAB, breathing and speaking comfortably on RA Abdominal: Soft, mildly tender only to deep palpation of the left upper quadrant, nondistended, normoactive bowel sounds Extremities: Moves all extremities grossly equally Neurological: No gross focal deficit Psychiatric: Appropriate mood and affect   ASSESSMENT/PLAN:   Pain of upper abdomen Improved since ED visit on its own.  Reassured by abdominal exam today.  Consider gastritis, H. pylori, GERD, sliding hiatal hernia (as seen on CTA) as cause of pain.  Less likely cardiopulmonary source of pain given workup in ED; however, patient does have a readily apparent  systolic murmur on my exam.  Given stability, we will proceed with outpatient echocardiogram and referral to cardiology for further evaluation.  Additionally, will switch omeprazole to pantoprazole 40 mg daily and test for H. pylori given possibility of reinfection by family members.  Follow-up in 2 to 3 weeks to assess improvement.  Thyroid nodule greater than or equal to 1 cm in diameter incidentally noted on imaging study Will order thyroid ultrasound for further evaluation.  Of note, patient is on Synthroid 75 mcg daily.  Hepatic steatosis Noted incidentally on CTA.  LFTs mildly elevated at that time as well.  Will repeat hepatic function panel today.   Janeal Holmes, MD [facility]

## 2023-04-21 ENCOUNTER — Telehealth: Payer: Self-pay | Admitting: *Deleted

## 2023-04-21 LAB — H. PYLORI BREATH TEST: H pylori Breath Test: NEGATIVE

## 2023-04-21 LAB — CBC WITH DIFFERENTIAL/PLATELET
Basophils Absolute: 0.1 10*3/uL (ref 0.0–0.2)
Basos: 1 %
EOS (ABSOLUTE): 0.2 10*3/uL (ref 0.0–0.4)
Eos: 3 %
Hematocrit: 37.5 % (ref 34.0–46.6)
Hemoglobin: 11.9 g/dL (ref 11.1–15.9)
Immature Grans (Abs): 0 10*3/uL (ref 0.0–0.1)
Immature Granulocytes: 0 %
Lymphocytes Absolute: 2.2 10*3/uL (ref 0.7–3.1)
Lymphs: 35 %
MCH: 24.4 pg — ABNORMAL LOW (ref 26.6–33.0)
MCHC: 31.7 g/dL (ref 31.5–35.7)
MCV: 77 fL — ABNORMAL LOW (ref 79–97)
Monocytes Absolute: 0.6 10*3/uL (ref 0.1–0.9)
Monocytes: 9 %
Neutrophils Absolute: 3.4 10*3/uL (ref 1.4–7.0)
Neutrophils: 52 %
Platelets: 382 10*3/uL (ref 150–450)
RBC: 4.87 x10E6/uL (ref 3.77–5.28)
RDW: 15.5 % — ABNORMAL HIGH (ref 11.7–15.4)
WBC: 6.4 10*3/uL (ref 3.4–10.8)

## 2023-04-21 LAB — HEPATIC FUNCTION PANEL
ALT: 70 IU/L — ABNORMAL HIGH (ref 0–32)
AST: 24 IU/L (ref 0–40)
Albumin: 4.6 g/dL (ref 3.9–4.9)
Alkaline Phosphatase: 122 IU/L — ABNORMAL HIGH (ref 44–121)
Bilirubin Total: 0.2 mg/dL (ref 0.0–1.2)
Bilirubin, Direct: <0.1 mg/dL (ref 0.00–0.40)
Total Protein: 8.2 g/dL (ref 6.0–8.5)

## 2023-04-21 NOTE — Progress Notes (Signed)
  Care Coordination   Note   04/21/2023 Name: Sherry Anderson MRN: [id number] DOB: 14-Feb-1961  Sherry Anderson is a 62 y.o. year old female who sees [name], [name], MD for primary care. I reached out to Evern Bio by phone today to offer care coordination services.  Sherry Anderson was given information about Care Coordination services today including:   The Care Coordination services include support from the care team which includes your Nurse Coordinator, Clinical Social Worker, or Pharmacist.  The Care Coordination team is here to help remove barriers to the health concerns and goals most important to you. Care Coordination services are voluntary, and the patient may decline or stop services at any time by request to their care team member.   Care Coordination Consent Status: Patient agreed to services and verbal consent obtained.   Follow up plan:  Telephone appointment with care coordination team member scheduled for:  04/27/23  Encounter Outcome:  Patient Scheduled  [facility] Coordination Care Guide  Direct Dial: [phone]

## 2023-04-21 NOTE — Progress Notes (Signed)
Messaged patient about MyChart results:  Hi! Your liver function tests remain slightly elevated. This could be due to the fatty liver that was seen on your recent CT scan.   Also, your H. Pylori breath test was negative; however, this can be false result when taking the omeprazole/pantoprazole. Therefore, if your pain continues even when switching to the pantoprazole, we may need to try going off of the medication and retesting for H. Pylori.   Let me know if pain persists before seeing Dr. [name] again at the end of the month.

## 2023-04-23 ENCOUNTER — Other Ambulatory Visit: Payer: Self-pay | Admitting: Family Medicine

## 2023-04-23 DIAGNOSIS — F39 Unspecified mood [affective] disorder: Secondary | ICD-10-CM

## 2023-04-25 ENCOUNTER — Other Ambulatory Visit (HOSPITAL_COMMUNITY): Payer: Medicare HMO

## 2023-04-25 ENCOUNTER — Ambulatory Visit (HOSPITAL_COMMUNITY)
Admission: RE | Admit: 2023-04-25 | Discharge: 2023-04-25 | Disposition: A | Discharge status: Home / Self Care | Payer: Medicare HMO | Source: Ambulatory Visit | Attending: Family Medicine | Admitting: Family Medicine

## 2023-04-25 DIAGNOSIS — E041 Nontoxic single thyroid nodule: Secondary | ICD-10-CM | POA: Diagnosis not present

## 2023-04-27 ENCOUNTER — Encounter: Payer: Self-pay | Admitting: *Deleted

## 2023-04-28 ENCOUNTER — Ambulatory Visit (HOSPITAL_COMMUNITY)
Admission: RE | Admit: 2023-04-28 | Discharge: 2023-04-28 | Disposition: A | Discharge status: Home / Self Care | Payer: Medicare HMO | Source: Ambulatory Visit | Attending: Family Medicine | Admitting: Family Medicine

## 2023-04-28 ENCOUNTER — Telehealth: Payer: Self-pay | Admitting: *Deleted

## 2023-04-28 DIAGNOSIS — R0602 Shortness of breath: Secondary | ICD-10-CM | POA: Insufficient documentation

## 2023-04-28 DIAGNOSIS — Z86711 Personal history of pulmonary embolism: Secondary | ICD-10-CM | POA: Insufficient documentation

## 2023-04-28 DIAGNOSIS — I340 Nonrheumatic mitral (valve) insufficiency: Secondary | ICD-10-CM | POA: Diagnosis not present

## 2023-04-28 DIAGNOSIS — I10 Essential (primary) hypertension: Secondary | ICD-10-CM | POA: Insufficient documentation

## 2023-04-28 DIAGNOSIS — R011 Cardiac murmur, unspecified: Secondary | ICD-10-CM | POA: Diagnosis not present

## 2023-04-28 DIAGNOSIS — I5030 Unspecified diastolic (congestive) heart failure: Secondary | ICD-10-CM | POA: Diagnosis not present

## 2023-04-28 DIAGNOSIS — R55 Syncope and collapse: Secondary | ICD-10-CM | POA: Insufficient documentation

## 2023-04-28 LAB — ECHOCARDIOGRAM COMPLETE
Area-P 1/2: 2.91 cm2
Calc EF: 72.5 %
S' Lateral: 2.1 cm
Single Plane A2C EF: 70.6 %
Single Plane A4C EF: 74.7 %

## 2023-04-28 NOTE — Progress Notes (Signed)
  Echocardiogram 2D Echocardiogram has been performed.  Sherry Anderson 04/28/2023, 3:56 PM

## 2023-04-28 NOTE — Patient Outreach (Signed)
  Care Coordination   04/28/2023 Name: Sherry Anderson MRN: [id number] DOB: November 19, 1960   Care Coordination Outreach Attempts:  An unsuccessful telephone outreach was attempted today to offer the patient information about available care coordination services.  Follow Up Plan:  Additional outreach attempts will be made to offer the patient care coordination information and services.   Encounter Outcome:  No Answer   Care Coordination Interventions:  No, not indicated    Reece Levy, MSW, LCSW Clinical Social Worker [phone]

## 2023-04-29 ENCOUNTER — Telehealth: Payer: Self-pay | Admitting: *Deleted

## 2023-04-29 NOTE — Progress Notes (Unsigned)
Care Coordination Note  04/29/2023 Name: [name] MRN: [id number] DOB: Apr 15, 1961  Sherry Anderson is a 62 y.o. year old female who is a primary care patient of Latrelle Dodrill, MD and is actively engaged with the care management team. I reached out to Evern Bio by phone today to assist with re-scheduling an initial visit with the Licensed Clinical Social Worker  Follow up plan: Unsuccessful telephone outreach attempt made. A HIPAA compliant phone message was left for the patient providing contact information and requesting a return call.   [facility]  Care Coordination Care Guide  Direct Dial: [phone]

## 2023-05-02 NOTE — Progress Notes (Signed)
Care Coordination Note  05/02/2023 Name: [name] MRN: [id number] DOB: 01/04/1961  Sherry Anderson is a 62 y.o. year old female who is a primary care patient of [name] [name], MD and is actively engaged with the care management team. I reached out to Evern Bio by phone today to assist with re-scheduling an initial visit with the RN Case Manager  Follow up plan: Telephone appointment with care management team member scheduled for:05/10/23  [facility] Coordination Care Guide  Direct Dial: [phone]

## 2023-05-09 NOTE — Progress Notes (Unsigned)
Cardiology Office Note:  .   Date:  05/12/2023  ID:  Sherry Anderson, DOB [birth year], MRN [id number] PCP: Sherry Dodrill, MD   HeartCare Providers Cardiologist:  Bryan Lemma, MD     Chief Complaint  Patient presents with   New Patient (Initial Visit)    New patient visit. Medication reviewed verbally. Hospital follow up dx chest pain. Patient c/o continued pain intermittently, sob, and dizziness at times.   Hospitalization Follow-up    Patient Profile: .     Sherry Anderson is a borderline obese 62 y.o. female with a PMH notable for HTN, prior DVT, BrCA in remission, s/p VPS shunt (for cerebral aneurysm), thyroid disease And Anxiety/Depression who presents here for Evaluation of Chest Pain & Murmur at the request of Sherry Dodrill, MD.  Evern Bio was seen at [facility], ER on 04/14/2021 for with abrupt onset severe chest pain waking her from sleep at 5 AM.  Began in the epigastric region and radiating up into the chest through the abdomen and into the back.  Associate with nausea and diaphoresis.  No lightheadedness, dizziness or weakness.  No syncope or near syncope.  No TIA or amaurosis fugax.  Noted that she has a family history of cardiac arrhythmias but no coronary artery disease.  She denied any palpitation at this point. => EKG was normal and troponin levels negative. Initially patient labs, chest x-ray, CT scan chest Recommended cardiology evaluation by PCP.  She was actually seen by Dr. Phineas Anderson on September 3 for ER follow-up.  Noted the pain was much better, not indicated there is in the left upper quadrant.  Not worse with eating meals or lying down.  However the episode that led to her going to the ER did occur while lying in bed. => Switch from omeprazole to pantoprazole and test for H. pylori.  Also found hepatic steatosis and thyroid nodule.    Subjective   INTERVAL HPI [name] presents today indicating that she just  feels tired.  Had an episode of epigastric pain radiating up into the chest & towards the back - not nearly as significant as ER related pain. Occurred while lying down ~ 4:30 AM. Spontaneously resolved unlike pain in ER - that required Morphine. + SOB & nauseated, dizzy.  No palpitations @ that time, but off & on feels skipping / flip-flopping for a few seconds.  No Anderson change with getting up & moving around. With initial episode (ER)  pain was so bad that she felt as though she would pass out.    Notes DOE with routine activity & feels tired.  Feels that Pantoprazole is working - no more CP episodes.  Occasionally feels as though she "stops breathing & needs to gasp to take deep breath -- can happen while watching TV or sleeping . Wakes up feeling tired, not well rested. Falls asleep after lunch, watching TV. Feels nauseated as if needs to rest.   Cardiovascular ROS: positive for - chest pain, dyspnea on exertion, palpitations, and generally feeling tired; near syncope with CP episode negative for - edema, loss of consciousness, orthopnea, paroxysmal nocturnal dyspnea, rapid heart rate, shortness of breath, or TIA/ amaurosis fugax, claudications  ROS:  Review of Systems - Negative except GERD - improving,  snores - occasionally wakes herself - never been assessed.     Objective  Studies Reviewed: .        CT Angio-Chest/PE 04/15/2023: No definitive PE.  Large sliding  type hiatal hernia.  18 mm left thyroid nodule.  Hepatic steatosis.  ECHO 04/28/2023: Hyperdynamic LV with EF 70 to 75%.  Mild LVH.  GR 1 DD.  Moderate LA dilation.  Mild MR.  Normal RV.  GXT 07/12/2017: Exercised 7:19 min.  Stopped because of dyspnea-no angina.  Heart rate increased to 173 bpm (106% MPHR).  9 METS.  No ischemic EKG changes.  [facility] TM score 7.  Lab Results  Component Value Date   CHOL 237 (H) 03/23/2022   HDL 75 03/23/2022   LDLCALC 134 (H) 03/23/2022   LDLDIRECT 157 (H) 08/20/2008   TRIG 158 (H) 03/23/2022    CHOLHDL 3.2 03/23/2022     Risk Assessment/Calculations:              Physical Exam:   VS:  BP 122/80 (BP Location: Right Arm, Patient Position: Sitting, Cuff Size: Normal)   Pulse 68   Ht 5\' 3"  (1.6 m)   Wt 172 lb 12.8 oz (78.4 kg)   LMP  (LMP Unknown) Comment: not sexually active  SpO2 96%   BMI 30.61 kg/m    Wt Readings from Last 3 Encounters:  05/12/23 172 lb 12.8 oz (78.4 kg)  04/19/23 174 lb (78.9 kg)  04/15/23 168 lb (76.2 kg)    GEN: Well nourished, well developed in no acute distress; mildly obese.  NECK: No JVD; No carotid bruits CARDIAC: Normal S1, S2; RRR, ~ soft 1/6 at most SEM @ RUSB); No, rubs, gallops RESPIRATORY:  Clear to auscultation without rales, wheezing or rhonchi ; nonlabored, good air movement. ABDOMEN: Soft, non-tender, non-distended EXTREMITIES:  No edema; No deformity      ASSESSMENT AND PLAN: .    Problem List Items Addressed This Visit       Cardiology Problems   Hyperlipidemia (Chronic)    LDL is better than it was in 2010, but still quite elevated.  Also total cholesterol is over 230.  Would at least like to see the LDL less than 100 and total cholesterol less than 200.  Depending on what the CAD burden appeared on Coronary CTA, we can adjust our targets of therapy.  Not currently on any medication, but would likely benefit from lipid lowering agents with borderline metabolic syndrome.      Relevant Medications   metoprolol tartrate (LOPRESSOR) 50 MG tablet   HYPERTENSION, BENIGN ESSENTIAL - Primary (Chronic)    Normal blood pressure today on lisinopril and Dyazide.      Relevant Medications   metoprolol tartrate (LOPRESSOR) 50 MG tablet   Other Relevant Orders   EKG 12-Lead (Completed)   Basic Metabolic Panel (BMET)     Other   Daytime sleepiness    She complains of feeling tired and sleepy all the time.  She feels like she would fall asleep during the day if she stops doing anything.  She sometimes feels lightheaded and  flushed and almost nauseated feeling as though she needs to sleep having to stop and rest.  She says that at night she also wakes up feeling as if she may have stopped breathing.  This can even happen while she is watching television.  Concern for OSA.  After ischemic assessment, would consider sleep study      Precordial pain    Somewhat atypical precordial pain but also now persistent exertional dyspnea.  I wonder if this is related to not getting enough sleep and potentially sleep apnea, however with her having at least 2 episodes of chest pain,  vomiting with exertional dyspnea, reasonable to assess for coronary ischemia. With her body habitus I am not certain that a GXT or Myoview would be effective in assessing her for ischemia as there is likely to be false positives or negatives.  Plan: Coronary CTA with possible FFR ct.  This will give [place] a baseline CAD burden and an accurate evaluation for possible ischemia.      Relevant Orders   CT CORONARY MORPH W/CTA COR W/SCORE W/CA W/CM &/OR WO/CM   Basic Metabolic Panel (BMET)   Prediabetes (Chronic)    Prediabetes combined with obesity, hypertension and hyperlipidemia would essential make up metabolic syndrome.  This puts her at increased risk for CAD.  As such I would like a more accurate assessment of notches potentially ischemic CAD but also simply atherosclerotic plaque burden in the coronaries.  This will help direct therapy and potentially initiate treatment sooner stage.      Systolic ejection murmur (Chronic)    I barely hear an echo murmur - likely flow murmur 2/2 hyperdynamic LV.   NO notable valvular findings on echo.              Dispo: Return in about 2 months (around 07/12/2023) for Routine Follow-up after testing ~ 1-2 months.  Total time spent: 26 min spent with patient + 19 min spent charting = 45 min      Signed, Marykay Lex, MD, MS Bryan Lemma, M.D., M.S. Interventional Cardiologist  [facility]  HeartCare  Pager # [phone] Phone # [phone] [address] [place], [place] [address]

## 2023-05-10 ENCOUNTER — Ambulatory Visit: Payer: Self-pay | Admitting: *Deleted

## 2023-05-10 ENCOUNTER — Encounter: Payer: Self-pay | Admitting: *Deleted

## 2023-05-10 NOTE — Patient Outreach (Signed)
Care Coordination   Initial Visit Note   05/10/2023 Name: Sherry Anderson MRN: [id number] DOB: 1961-08-02  Sherry Anderson is a 62 y.o. year old female who sees [name], [name], MD for primary care. I spoke with  Sherry Anderson by phone today.  What matters to the patients health and wellness today?  Depression.    Goals Addressed             This Visit's Progress    Reduce symptoms of depression and loneliness       Activities and task to complete in order to accomplish goals.   Call or go to Department of Social Services inquire about Food Stamps Call your insurance provider for more information about your Enhanced Benefits  Follow up on food resources discussed [facility]  [employer] (select "find help") - Find Food  [place], [place] (ghpfa.org) or download the free app to your smart phone Food and Nutrition Services - 470-605-3980 or apply online at [facility] - ePASS You have decided not to move forward with counseling and would like to work with me on brief coping skills to assist you with managing your depression and feelings of loneliness Start / continue relaxed breathing 3 times daily Keep all upcoming appointment discussed today Continue with compliance of taking medication prescribed by Doctor Discuss with PCP at next visit (05/12/23) possible adjustments/changes to RX for depression Consider volunteer or part time work with flowers or something else you enjoy         SDOH assessments and interventions completed:  Yes  SDOH Interventions Today    Flowsheet Row Most Recent Value  SDOH Interventions   Food Insecurity Interventions Intervention Not Indicated  Transportation Interventions Intervention Not Indicated  Alcohol Usage Interventions Intervention Not Indicated (Score <7)  Depression Interventions/Treatment  Medication  Financial Strain Interventions Intervention Not Indicated  Stress Interventions  Intervention Not Indicated        Care Coordination Interventions:  Yes, provided  Interventions Today    Flowsheet Row Most Recent Value  Chronic Disease   Chronic disease during today's visit Other  [depression/loneliness]  General Interventions   General Interventions Discussed/Reviewed General Interventions Discussed, General Interventions Reviewed, Community Resources  Mental Health Interventions   Mental Health Discussed/Reviewed Mental Health Discussed, Mental Health Reviewed, Coping Strategies, Depression  [Pt reports long history of depression. No hospitalizations and has been on current RX regimen a "long time".  CSW suggested she discuss with PCP at her visit this week possible treatment options. Denies hx or current SI/HI]  Safety Interventions   Safety Discussed/Reviewed Safety Discussed         05/10/2023    1:07 PM 10/08/2022   10:01 AM 10/08/2022    8:42 AM 07/12/2022    2:48 PM 03/23/2022    9:00 AM  Depression screen PHQ 2/9  Decreased Interest 3 0 0 0 1  Down, Depressed, Hopeless 3 0 1 0 2  PHQ - 2 Score 6 0 1 0 3  Altered sleeping 3 2  0 3  Tired, decreased energy 3 2  2 1   Change in appetite 0 0  0 2  Feeling bad or failure about yourself  0 0  0 1  Trouble concentrating 0 0  0 0  Moving slowly or fidgety/restless 0 0  0 0  Suicidal thoughts 0 0  0 0  PHQ-9 Score 12 4  2 10   Difficult doing work/chores Not difficult at all  Follow up plan: Follow up call scheduled for 05/18/23    Encounter Outcome:  Patient Visit Completed

## 2023-05-10 NOTE — Patient Instructions (Signed)
Visit Information  Thank you for taking time to visit with me today. Please don't hesitate to contact me if I can be of assistance to you.   Following are the goals we discussed today:   Goals Addressed             This Visit's Progress    Reduce symptoms of depression and loneliness       Activities and task to complete in order to accomplish goals.   Call or go to Department of Social Services inquire about Food Stamps Call your insurance provider for more information about your Enhanced Benefits  Follow up on food resources discussed [facility]  [employer] (select "find help") - Find Food  [place], [place] (ghpfa.org) or download the free app to your smart phone Food and Nutrition Services - 6466132945 or apply online at [facility] - ePASS You have decided not to move forward with counseling and would like to work with me on brief coping skills to assist you with managing your depression and feelings of loneliness Start / continue relaxed breathing 3 times daily Keep all upcoming appointment discussed today Continue with compliance of taking medication prescribed by Doctor Discuss with PCP at next visit (05/12/23) possible adjustments/changes to RX for depression Consider volunteer or part time work with flowers or something else you enjoy         Our next appointment is by telephone on 05/18/23  Please call the care guide team at [phone] if you need to cancel or reschedule your appointment.   If you are experiencing a Mental Health or Behavioral Health Crisis or need someone to talk to, please call the Suicide and Crisis Lifeline: 988 call 911   The patient verbalized understanding of instructions, educational materials, and care plan provided today and DECLINED offer to receive copy of patient instructions, educational materials, and care plan.   Telephone follow up appointment with care management team member scheduled for: 05/18/23  Reece Levy, MSW, LCSW Clinical Social Worker [phone]

## 2023-05-12 ENCOUNTER — Ambulatory Visit (INDEPENDENT_AMBULATORY_CARE_PROVIDER_SITE_OTHER): Payer: Medicare HMO | Admitting: Family Medicine

## 2023-05-12 ENCOUNTER — Encounter: Payer: Self-pay | Admitting: Cardiology

## 2023-05-12 ENCOUNTER — Other Ambulatory Visit: Payer: Self-pay

## 2023-05-12 ENCOUNTER — Ambulatory Visit: Payer: Medicare HMO | Attending: Cardiology | Admitting: Cardiology

## 2023-05-12 ENCOUNTER — Encounter: Payer: Self-pay | Admitting: Family Medicine

## 2023-05-12 VITALS — BP 132/68 | HR 96 | Ht 63.0 in | Wt 176.0 lb

## 2023-05-12 VITALS — BP 122/80 | HR 68 | Ht 63.0 in | Wt 172.8 lb

## 2023-05-12 DIAGNOSIS — R7303 Prediabetes: Secondary | ICD-10-CM

## 2023-05-12 DIAGNOSIS — E038 Other specified hypothyroidism: Secondary | ICD-10-CM | POA: Diagnosis not present

## 2023-05-12 DIAGNOSIS — I10 Essential (primary) hypertension: Secondary | ICD-10-CM | POA: Diagnosis not present

## 2023-05-12 DIAGNOSIS — R072 Precordial pain: Secondary | ICD-10-CM | POA: Diagnosis not present

## 2023-05-12 DIAGNOSIS — D649 Anemia, unspecified: Secondary | ICD-10-CM | POA: Diagnosis not present

## 2023-05-12 DIAGNOSIS — K760 Fatty (change of) liver, not elsewhere classified: Secondary | ICD-10-CM

## 2023-05-12 DIAGNOSIS — Z0000 Encounter for general adult medical examination without abnormal findings: Secondary | ICD-10-CM

## 2023-05-12 DIAGNOSIS — F39 Unspecified mood [affective] disorder: Secondary | ICD-10-CM | POA: Diagnosis not present

## 2023-05-12 DIAGNOSIS — Z23 Encounter for immunization: Secondary | ICD-10-CM

## 2023-05-12 DIAGNOSIS — R011 Cardiac murmur, unspecified: Secondary | ICD-10-CM

## 2023-05-12 DIAGNOSIS — E782 Mixed hyperlipidemia: Secondary | ICD-10-CM | POA: Diagnosis not present

## 2023-05-12 DIAGNOSIS — R400 Somnolence: Secondary | ICD-10-CM

## 2023-05-12 MED ORDER — METOPROLOL TARTRATE 50 MG PO TABS: 50.0000 mg | ORAL_TABLET | Freq: Once | ORAL | 0 refills | Status: DC

## 2023-05-12 MED ORDER — QUETIAPINE FUMARATE 50 MG PO TABS: 50.0000 mg | ORAL_TABLET | Freq: Every day | ORAL | 1 refills | Status: DC

## 2023-05-12 NOTE — Assessment & Plan Note (Signed)
Normal blood pressure today on lisinopril and Dyazide.

## 2023-05-12 NOTE — Progress Notes (Signed)
  Date of Visit: 05/12/2023   SUBJECTIVE:   HPI:  Sherry Anderson presents today for routine follow-up.  Had cardiologist appointment this morning.  They are planning a coronary artery CT.  Depression: Currently taking venlafaxine 225 mg daily.  Has felt more depressed recently.  No thoughts of harming herself or others.  Says she has felt badly ever since her parents died 4 years ago.  Has felt fatigued and sleepy.  Subclinical hypothyroidism: Currently taking levothyroxine 75 mcg daily.  Amenable to TSH check today.  History of anemia: Reports being off of her iron supplementation recently.   OBJECTIVE:   BP 132/68   Pulse 96   Ht 5\' 3"  (1.6 m)   Wt 176 lb (79.8 kg)   LMP  (LMP Unknown) Comment: not sexually active  SpO2 100%   BMI 31.18 kg/m  Gen: No acute distress, pleasant, cooperative HEENT: Normocephalic, atraumatic Heart: Regular rate and rhythm, no murmur Lungs: Clear to auscultation bilaterally, normal effort Neuro: Grossly nonfocal, speech normal Psych: Tearful, sad affect, normal eye contact, speech normal in rate and volume  ASSESSMENT/PLAN:   Assessment & Plan Hepatic steatosis Recent mild elevation of LFTs.  Steatosis noted on imaging in ER.  Update LFTs today. Subclinical hypothyroidism Update TSH today.  Continue levothyroxine. Anemia, unspecified type Update iron studies today, currently off iron supplementation.  Recent CBC without anemia. Mood disorder (HCC) Depression despite maximal dose of venlafaxine.  Discussed options of seeing therapist and/or psychiatrist.  Ideally she would see psychiatry, though we have had difficulty getting her in there in the past.  Add Seroquel 50 mg nightly.  Follow-up in 1 month to see how she is doing. Routine adult health maintenance Flu and COVID vaccines given today    FOLLOW UP: Follow up in 1 mo for mood  [place] J. [name], MD [facility]

## 2023-05-12 NOTE — Assessment & Plan Note (Signed)
Somewhat atypical precordial pain but also now persistent exertional dyspnea.  I wonder if this is related to not getting enough sleep and potentially sleep apnea, however with her having at least 2 episodes of chest pain, vomiting with exertional dyspnea, reasonable to assess for coronary ischemia. With her body habitus I am not certain that a GXT or Myoview would be effective in assessing her for ischemia as there is likely to be false positives or negatives.  Plan: Coronary CTA with possible FFR ct.  This will give [place] a baseline CAD burden and an accurate evaluation for possible ischemia.

## 2023-05-12 NOTE — Assessment & Plan Note (Addendum)
I barely hear an echo murmur - likely flow murmur 2/2 hyperdynamic LV.   NO notable valvular findings on echo.

## 2023-05-12 NOTE — Assessment & Plan Note (Signed)
Prediabetes combined with obesity, hypertension and hyperlipidemia would essential make up metabolic syndrome.  This puts her at increased risk for CAD.  As such I would like a more accurate assessment of notches potentially ischemic CAD but also simply atherosclerotic plaque burden in the coronaries.  This will help direct therapy and potentially initiate treatment sooner stage.

## 2023-05-12 NOTE — Assessment & Plan Note (Signed)
She complains of feeling tired and sleepy all the time.  She feels like she would fall asleep during the day if she stops doing anything.  She sometimes feels lightheaded and flushed and almost nauseated feeling as though she needs to sleep having to stop and rest.  She says that at night she also wakes up feeling as if she may have stopped breathing.  This can even happen while she is watching television.  Concern for OSA.  After ischemic assessment, would consider sleep study

## 2023-05-12 NOTE — Assessment & Plan Note (Signed)
LDL is better than it was in 2010, but still quite elevated.  Also total cholesterol is over 230.  Would at least like to see the LDL less than 100 and total cholesterol less than 200.  Depending on what the CAD burden appeared on Coronary CTA, we can adjust our targets of therapy.  Not currently on any medication, but would likely benefit from lipid lowering agents with borderline metabolic syndrome.

## 2023-05-12 NOTE — Patient Instructions (Signed)
Medication Instructions:  Your physician recommends that you continue on your current medications as directed. Please refer to the Current Medication list given to you today.  *If you need a refill on your cardiac medications before your next appointment, please call your pharmacy*  Lab Work: Your provider would like for you to have following labs drawn today BMET.  If you have labs (blood work) drawn today and your tests are completely normal, you will receive your results only by: MyChart Message (if you have MyChart) OR A paper copy in the mail If you have any lab test that is abnormal or we need to change your treatment, we will call you to review the results.  Testing/Procedures:   Your cardiac CT will be scheduled at one of the below locations:   [facility] [address] [place], [place] [address] [phone]  OR   [facility] [address] [place], [place] [address] [phone]  If scheduled at [facility] or [facility], please arrive 15 mins early for check-in and test prep.  There is spacious parking and easy access to the radiology department from the [facility] entrance. Please enter here and check-in with the desk attendant.   Please follow these instructions carefully (unless otherwise directed):  An IV will be required for this test and Nitroglycerin will be given.   On the Night Before the Test: Be sure to Drink plenty of water. Do not consume any caffeinated/decaffeinated beverages or chocolate 12 hours prior to your test. Do not take any antihistamines 12 hours prior to your test.  On the Day of the Test: Drink plenty of water until 1 hour prior to the test. Do not eat any food 1 hour prior to test. You may take your regular medications prior to the test.  Take metoprolol (Lopressor) two hours prior to test. If you take  Furosemide/Hydrochlorothiazide/Spironolactone, please HOLD on the morning of the test. FEMALES- please wear underwire-free bra if available, avoid dresses & tight clothing     After the Test: Drink plenty of water. After receiving IV contrast, you may experience a mild flushed feeling. This is normal. On occasion, you may experience a mild rash up to 24 hours after the test. This is not dangerous. If this occurs, you can take Benadryl 25 mg and increase your fluid intake. If you experience trouble breathing, this can be serious. If it is severe call 911 IMMEDIATELY. If it is mild, please call our office. If you take any of these medications: Glipizide/Metformin, Avandament, Glucavance, please do not take 48 hours after completing test unless otherwise instructed.  We will call to schedule your test 2-4 weeks out understanding that some insurance companies will need an authorization prior to the service being performed.   For more information and frequently asked questions, please visit our website : [email or web address]  For non-scheduling related questions, please contact the cardiac imaging nurse navigator should you have any questions/concerns: Cardiac Imaging Nurse Navigators Direct Office Dial: [phone]   For scheduling needs, including cancellations and rescheduling, please call [place], [phone].   Follow-Up: At [facility], you and your health needs are our priority.  As part of our continuing mission to provide you with exceptional heart care, we have created designated Provider Care Teams.  These Care Teams include your primary Cardiologist (physician) and Advanced Practice Providers (APPs -  Physician Assistants and Nurse Practitioners) who all work together  to provide you with the care you need, when you need it.  Your next appointment:   Follow-up after coronary CT   Provider:   You may see Bryan Lemma, MD or one of the following Advanced  Practice Providers on your designated Care Team:   Nicolasa Ducking, NP Eula Listen, PA-C Cadence Fransico Michael, PA-C Charlsie Quest, NP    Other Instructions -None

## 2023-05-12 NOTE — Patient Instructions (Signed)
It was great to see you again today.  Sent in new medication to add to your venlafaxine  Checking thyroid and liver labs today  Flu and COVID shots today  Follow up with me as scheduled in 1 month   Be well, Dr. [name]

## 2023-05-13 LAB — IRON,TIBC AND FERRITIN PANEL
Ferritin: 8 ng/mL — ABNORMAL LOW (ref 15–150)
Iron Saturation: 8 % — CL (ref 15–55)
Iron: 41 ug/dL (ref 27–139)
Total Iron Binding Capacity: 523 ug/dL — ABNORMAL HIGH (ref 250–450)
UIBC: 482 ug/dL — ABNORMAL HIGH (ref 118–369)

## 2023-05-13 LAB — BASIC METABOLIC PANEL
BUN/Creatinine Ratio: 20 (ref 12–28)
BUN: 19 mg/dL (ref 8–27)
CO2: 23 mmol/L (ref 20–29)
Calcium: 9.6 mg/dL (ref 8.7–10.3)
Chloride: 103 mmol/L (ref 96–106)
Creatinine, Ser: 0.96 mg/dL (ref 0.57–1.00)
Glucose: 110 mg/dL — ABNORMAL HIGH (ref 70–99)
Potassium: 3.9 mmol/L (ref 3.5–5.2)
Sodium: 139 mmol/L (ref 134–144)
eGFR: 66 mL/min/{1.73_m2} (ref >59)

## 2023-05-13 LAB — HEPATIC FUNCTION PANEL
ALT: 39 [IU]/L — ABNORMAL HIGH (ref 0–32)
AST: 25 [IU]/L (ref 0–40)
Albumin: 4.6 g/dL (ref 3.9–4.9)
Alkaline Phosphatase: 105 [IU]/L (ref 44–121)
Bilirubin Total: 0.2 mg/dL (ref 0.0–1.2)
Bilirubin, Direct: <0.1 mg/dL (ref 0.00–0.40)
Total Protein: 7.8 g/dL (ref 6.0–8.5)

## 2023-05-13 LAB — TSH: TSH: 1.65 u[IU]/mL (ref 0.450–4.500)

## 2023-05-14 DIAGNOSIS — Z0000 Encounter for general adult medical examination without abnormal findings: Secondary | ICD-10-CM | POA: Insufficient documentation

## 2023-05-14 NOTE — Assessment & Plan Note (Signed)
Recent mild elevation of LFTs.  Steatosis noted on imaging in ER.  Update LFTs today.

## 2023-05-14 NOTE — Assessment & Plan Note (Signed)
Flu and COVID vaccines given today

## 2023-05-14 NOTE — Assessment & Plan Note (Signed)
Depression despite maximal dose of venlafaxine.  Discussed options of seeing therapist and/or psychiatrist.  Ideally she would see psychiatry, though we have had difficulty getting her in there in the past.  Add Seroquel 50 mg nightly.  Follow-up in 1 month to see how she is doing.

## 2023-05-14 NOTE — Assessment & Plan Note (Signed)
Update TSH today.  Continue levothyroxine.

## 2023-05-14 NOTE — Assessment & Plan Note (Signed)
Update iron studies today, currently off iron supplementation.  Recent CBC without anemia.

## 2023-05-17 ENCOUNTER — Encounter: Payer: Self-pay | Admitting: Family Medicine

## 2023-05-17 DIAGNOSIS — E611 Iron deficiency: Secondary | ICD-10-CM

## 2023-05-17 MED ORDER — FERROUS SULFATE 325 (65 FE) MG PO TABS: 325.0000 mg | ORAL_TABLET | ORAL | 1 refills | Status: DC

## 2023-05-18 ENCOUNTER — Encounter: Payer: Self-pay | Admitting: *Deleted

## 2023-05-18 ENCOUNTER — Telehealth: Payer: Self-pay | Admitting: *Deleted

## 2023-05-18 NOTE — Patient Outreach (Signed)
  Care Coordination   05/18/2023 Name: BENA KOBEL MRN: [id number] DOB: 10/09/1960   Care Coordination Outreach Attempts:  An unsuccessful telephone outreach was attempted today to offer the patient information about available care coordination services.  Follow Up Plan:  Additional outreach attempts will be made to offer the patient care coordination information and services.   Encounter Outcome:  No Answer   Care Coordination Interventions:  No, not indicated    Reece Levy, MSW, LCSW Clinical Social Worker [phone]

## 2023-05-19 ENCOUNTER — Telehealth: Payer: Self-pay | Admitting: *Deleted

## 2023-05-19 NOTE — Patient Outreach (Signed)
  Care Coordination   05/19/2023 Name: Sherry Anderson MRN: [id number] DOB: July 25, 1961   Care Coordination Outreach Attempts:  A second unsuccessful outreach was attempted today to offer the patient with information about available care coordination services.  Follow Up Plan:  Additional outreach attempts will be made to offer the patient care coordination information and services.   Encounter Outcome:  No Answer   Care Coordination Interventions:  No, not indicated  {THN Tip this will not be part of the note when signed. Reece Levy, MSW, LCSW Clinical Social Worker [phone]

## 2023-05-20 ENCOUNTER — Encounter (HOSPITAL_COMMUNITY): Payer: Self-pay

## 2023-05-24 ENCOUNTER — Encounter (HOSPITAL_COMMUNITY): Payer: Self-pay

## 2023-05-24 ENCOUNTER — Encounter: Payer: Self-pay | Admitting: *Deleted

## 2023-05-24 ENCOUNTER — Ambulatory Visit (HOSPITAL_COMMUNITY)
Admission: RE | Admit: 2023-05-24 | Discharge: 2023-05-24 | Disposition: A | Discharge status: Home / Self Care | Payer: Medicare HMO | Source: Ambulatory Visit | Attending: Cardiology | Admitting: Cardiology

## 2023-05-24 DIAGNOSIS — R072 Precordial pain: Secondary | ICD-10-CM | POA: Diagnosis not present

## 2023-05-24 MED ORDER — IOHEXOL 350 MG/ML SOLN
100.0000 mL | Freq: Once | INTRAVENOUS | Status: AC | PRN
  Administered 2023-05-24: 100 mL via INTRAVENOUS

## 2023-05-24 MED ORDER — NITROGLYCERIN 0.4 MG SL SUBL
0.8000 mg | SUBLINGUAL_TABLET | Freq: Once | SUBLINGUAL | Status: AC
  Administered 2023-05-24: 0.8 mg via SUBLINGUAL

## 2023-05-24 MED ORDER — NITROGLYCERIN 0.4 MG SL SUBL
SUBLINGUAL_TABLET | SUBLINGUAL | Status: AC
  Filled 2023-05-24: qty 2

## 2023-05-24 NOTE — OR Nursing (Signed)
Pt expressed she feels better and not lightheaded after walking in the hallway and drinking fluids. BP was 109/66, HR-58.

## 2023-05-24 NOTE — OR Nursing (Addendum)
Pt felt slightly light-headed after coronary study. BP was 92/53, HR-63. Kept pt after study for observation. Dr.Turner made aware. Pt given water and crackers while waiting

## 2023-05-25 ENCOUNTER — Telehealth: Payer: Self-pay | Admitting: *Deleted

## 2023-05-25 NOTE — Patient Outreach (Signed)
  Care Coordination   05/25/2023 Name: DONNAE MICHELS MRN: [id number] DOB: 02-Oct-1960   Care Coordination Outreach Attempts:  An unsuccessful telephone outreach was attempted today to offer the patient information about available care coordination services.  Follow Up Plan:  Additional outreach attempts will be made to offer the patient care coordination information and services.   Encounter Outcome:  No Answer   Care Coordination Interventions:  No, not indicated    Reece Levy, MSW, LCSW Clinical Social Worker [phone]

## 2023-05-27 ENCOUNTER — Ambulatory Visit: Payer: Self-pay

## 2023-05-27 NOTE — Patient Instructions (Signed)
Visit Information  Thank you for taking time to visit with me today. Please don't hesitate to contact me if I can be of assistance to you.   Following are the goals we discussed today:   Goals Addressed             This Visit's Progress    I would like to know more information about my heart murmur and fatty liver       Patient Goals/Self Care Activities: -Patient/Caregiver will take medications as prescribed   -Patient/Caregiver will attend all scheduled provider appointments -Patient/Caregiver will call pharmacy for medication refills 3-7 days in advance of running out of medications -Patient/Caregiver will call provider office for new concerns or questions  -Patient/Caregiver will focus on medication adherence by taking medications as prescribed  Provided education to patient re: Heart  murmur and Fatty liver Reviewed medications with patient and discussed  Reviewed scheduled/upcoming provider appointments  Discussed plans with patient for ongoing care management follow up and provided patient with direct contact information for care management team;          Our next appointment is by telephone on 06/10/23 at 10 am  Please call the care guide team at [phone] if you need to cancel or reschedule your appointment.   If you are experiencing a Mental Health or Behavioral Health Crisis or need someone to talk to, please call 1-800-273-TALK (toll free, 24 hour hotline)  Patient verbalizes understanding of instructions and care plan provided today and agrees to view in MyChart. Active MyChart status and patient understanding of how to access instructions and care plan via MyChart confirmed with patient.     Juanell Fairly RN, BSN, [facility] [employer]er/designer Phone: [phone]

## 2023-05-27 NOTE — Patient Outreach (Signed)
  Care Coordination   Initial Visit Note   05/27/2023 Name: Sherry Anderson MRN: [id number] DOB: Apr 06, 1961  Sherry Anderson is a 62 y.o. year old female who sees Sherry Anderson, Sherry Ryder, MD for primary care. I spoke with  Evern Anderson by phone today.  What matters to the patients health and wellness today?  During my conversation with Sherry Anderson, she mentioned that she had experienced chest pain only once, which prompted her to go to the emergency room. Since then, she has had chest pain one more time, but it was not as severe as before. She has also been feeling tired, experiencing shortness of breath, and sweating. Sherry Anderson has already discussed these symptoms with Doctor Sherry Anderson and has a follow-up appointment scheduled for May 24th. She would like Doctor Sherry Anderson to explain the results of her thyroid ultrasound and CT scan of her heart, as she finds them confusing. I assured her that I would convey her request to Doctor Sherry Anderson. Additionally, we discussed her heart murmur and fatty liver. Sherry Anderson shared what she knew about these conditions, and I provided some information. I will also send her some educational materials through my chart.       Goals Addressed             This Visit's Progress    I would like to know more information about my heart murmur and fatty liver       Patient Goals/Self Care Activities: -Patient/Caregiver will take medications as prescribed   -Patient/Caregiver will attend all scheduled provider appointments -Patient/Caregiver will call pharmacy for medication refills 3-7 days in advance of running out of medications -Patient/Caregiver will call provider office for new concerns or questions  -Patient/Caregiver will focus on medication adherence by taking medications as prescribed  Provided education to patient re: Heart  murmur and Fatty liver Reviewed medications with patient and discussed  Reviewed scheduled/upcoming  provider appointments  Discussed plans with patient for ongoing care management follow up and provided patient with direct contact information for care management team;          SDOH assessments and interventions completed:  Yes  SDOH Interventions Today    Flowsheet Row Most Recent Value  SDOH Interventions   Food Insecurity Interventions Intervention Not Indicated  Transportation Interventions Intervention Not Indicated        Care Coordination Interventions:  Yes, provided   Interventions Today    Flowsheet Row Most Recent Value  Chronic Disease   Chronic disease during today's visit --  [Heart murmur and Fatty liver]  General Interventions   General Interventions Discussed/Reviewed General Interventions Discussed  Education Interventions   Education Provided Provided Education  Provided Verbal Education On Nutrition  Pharmacy Interventions   Pharmacy Dicussed/Reviewed Pharmacy Topics Discussed  Safety Interventions   Safety Discussed/Reviewed Safety Discussed       Follow up plan: Follow up call scheduled for 06/10/23  10 am    Encounter Outcome:  Patient Visit Completed   Juanell Fairly RN, BSN, [facility]   Care Coordinator Phone: [phone]

## 2023-06-03 ENCOUNTER — Telehealth: Payer: Self-pay | Admitting: *Deleted

## 2023-06-03 ENCOUNTER — Other Ambulatory Visit: Payer: Self-pay | Admitting: Family Medicine

## 2023-06-03 NOTE — Progress Notes (Unsigned)
  Care Coordination Note  06/03/2023 Name: Sherry Anderson MRN: [id number] DOB: [birth year]  Sherry Anderson is a 62 y.o. year old female who is a primary care patient of Latrelle Dodrill, MD and is actively engaged with the care management team. I reached out to Evern Bio by phone today to assist with re-scheduling a follow up visit with the Licensed Clinical Social Worker  Follow up plan: Unsuccessful telephone outreach attempt made. A HIPAA compliant phone message was left for the patient providing contact information and requesting a return call.   [facility]  Care Coordination Care Guide  Direct Dial: [phone]

## 2023-06-04 ENCOUNTER — Other Ambulatory Visit: Payer: Self-pay

## 2023-06-04 ENCOUNTER — Observation Stay (HOSPITAL_COMMUNITY)
Admission: EM | Admit: 2023-06-04 | Discharge: 2023-06-06 | Disposition: A | Discharge status: Home / Self Care | Payer: Medicare HMO | Attending: Internal Medicine | Admitting: Internal Medicine

## 2023-06-04 DIAGNOSIS — E039 Hypothyroidism, unspecified: Secondary | ICD-10-CM | POA: Insufficient documentation

## 2023-06-04 DIAGNOSIS — Z1152 Encounter for screening for COVID-19: Secondary | ICD-10-CM | POA: Insufficient documentation

## 2023-06-04 DIAGNOSIS — K219 Gastro-esophageal reflux disease without esophagitis: Secondary | ICD-10-CM | POA: Insufficient documentation

## 2023-06-04 DIAGNOSIS — D509 Iron deficiency anemia, unspecified: Secondary | ICD-10-CM | POA: Diagnosis not present

## 2023-06-04 DIAGNOSIS — G4733 Obstructive sleep apnea (adult) (pediatric): Secondary | ICD-10-CM | POA: Insufficient documentation

## 2023-06-04 DIAGNOSIS — R110 Nausea: Secondary | ICD-10-CM | POA: Diagnosis not present

## 2023-06-04 DIAGNOSIS — K449 Diaphragmatic hernia without obstruction or gangrene: Secondary | ICD-10-CM | POA: Diagnosis not present

## 2023-06-04 DIAGNOSIS — Z800 Family history of malignant neoplasm of digestive organs: Secondary | ICD-10-CM | POA: Insufficient documentation

## 2023-06-04 DIAGNOSIS — F329 Major depressive disorder, single episode, unspecified: Secondary | ICD-10-CM | POA: Diagnosis not present

## 2023-06-04 DIAGNOSIS — E785 Hyperlipidemia, unspecified: Secondary | ICD-10-CM | POA: Diagnosis not present

## 2023-06-04 DIAGNOSIS — I10 Essential (primary) hypertension: Secondary | ICD-10-CM | POA: Diagnosis not present

## 2023-06-04 DIAGNOSIS — Z950 Presence of cardiac pacemaker: Secondary | ICD-10-CM | POA: Diagnosis not present

## 2023-06-04 DIAGNOSIS — E041 Nontoxic single thyroid nodule: Secondary | ICD-10-CM | POA: Diagnosis not present

## 2023-06-04 DIAGNOSIS — R7989 Other specified abnormal findings of blood chemistry: Secondary | ICD-10-CM

## 2023-06-04 DIAGNOSIS — Z743 Need for continuous supervision: Secondary | ICD-10-CM | POA: Diagnosis not present

## 2023-06-04 DIAGNOSIS — R531 Weakness: Secondary | ICD-10-CM | POA: Diagnosis not present

## 2023-06-04 DIAGNOSIS — R1031 Right lower quadrant pain: Secondary | ICD-10-CM | POA: Diagnosis not present

## 2023-06-04 DIAGNOSIS — R1084 Generalized abdominal pain: Secondary | ICD-10-CM | POA: Diagnosis not present

## 2023-06-04 DIAGNOSIS — I2542 Coronary artery dissection: Secondary | ICD-10-CM | POA: Diagnosis not present

## 2023-06-04 DIAGNOSIS — R55 Syncope and collapse: Secondary | ICD-10-CM | POA: Diagnosis not present

## 2023-06-04 DIAGNOSIS — R109 Unspecified abdominal pain: Secondary | ICD-10-CM

## 2023-06-04 MED ORDER — SODIUM CHLORIDE 0.9 % IV BOLUS
500.0000 mL | Freq: Once | INTRAVENOUS | Status: AC
  Administered 2023-06-05: 500 mL via INTRAVENOUS

## 2023-06-04 NOTE — ED Triage Notes (Signed)
From home, spanish speaking, seen pcp about a week ago, some sob and weakness. Using restroom, had acute right side abdominal pain, passed out. 1 episode emsis, hx of fatty liver.  110/60 100 HR 148 cbg 20 R hand left side restricted.

## 2023-06-05 ENCOUNTER — Emergency Department (HOSPITAL_COMMUNITY): Payer: Medicare HMO

## 2023-06-05 DIAGNOSIS — I10 Essential (primary) hypertension: Secondary | ICD-10-CM

## 2023-06-05 DIAGNOSIS — G4710 Hypersomnia, unspecified: Secondary | ICD-10-CM

## 2023-06-05 DIAGNOSIS — I951 Orthostatic hypotension: Secondary | ICD-10-CM | POA: Diagnosis not present

## 2023-06-05 DIAGNOSIS — D509 Iron deficiency anemia, unspecified: Secondary | ICD-10-CM

## 2023-06-05 DIAGNOSIS — R109 Unspecified abdominal pain: Secondary | ICD-10-CM | POA: Diagnosis not present

## 2023-06-05 DIAGNOSIS — R55 Syncope and collapse: Secondary | ICD-10-CM | POA: Diagnosis not present

## 2023-06-05 DIAGNOSIS — E039 Hypothyroidism, unspecified: Secondary | ICD-10-CM | POA: Diagnosis not present

## 2023-06-05 DIAGNOSIS — R1031 Right lower quadrant pain: Secondary | ICD-10-CM | POA: Diagnosis not present

## 2023-06-05 DIAGNOSIS — K449 Diaphragmatic hernia without obstruction or gangrene: Secondary | ICD-10-CM | POA: Diagnosis not present

## 2023-06-05 DIAGNOSIS — R0602 Shortness of breath: Secondary | ICD-10-CM | POA: Diagnosis not present

## 2023-06-05 DIAGNOSIS — R7989 Other specified abnormal findings of blood chemistry: Secondary | ICD-10-CM

## 2023-06-05 DIAGNOSIS — R531 Weakness: Secondary | ICD-10-CM | POA: Diagnosis not present

## 2023-06-05 LAB — COMPREHENSIVE METABOLIC PANEL
ALT: 19 U/L (ref 0–44)
AST: 22 U/L (ref 15–41)
Albumin: 3.7 g/dL (ref 3.5–5.0)
Alkaline Phosphatase: 49 U/L (ref 38–126)
Anion gap: 9 (ref 5–15)
BUN: 25 mg/dL — ABNORMAL HIGH (ref 8–23)
CO2: 26 mmol/L (ref 22–32)
Calcium: 9.3 mg/dL (ref 8.9–10.3)
Chloride: 103 mmol/L (ref 98–111)
Creatinine, Ser: 0.9 mg/dL (ref 0.44–1.00)
GFR, Estimated: >60 mL/min (ref >60)
Glucose, Bld: 110 mg/dL — ABNORMAL HIGH (ref 70–99)
Potassium: 3.5 mmol/L (ref 3.5–5.1)
Sodium: 138 mmol/L (ref 135–145)
Total Bilirubin: 0.4 mg/dL (ref 0.3–1.2)
Total Protein: 7 g/dL (ref 6.5–8.1)

## 2023-06-05 LAB — CBC WITH DIFFERENTIAL/PLATELET
Abs Immature Granulocytes: 0.08 10*3/uL — ABNORMAL HIGH (ref 0.00–0.07)
Basophils Absolute: 0 10*3/uL (ref 0.0–0.1)
Basophils Relative: 0 %
Eosinophils Absolute: 0.1 10*3/uL (ref 0.0–0.5)
Eosinophils Relative: 1 %
HCT: 28.6 % — ABNORMAL LOW (ref 36.0–46.0)
Hemoglobin: 9.1 g/dL — ABNORMAL LOW (ref 12.0–15.0)
Immature Granulocytes: 1 %
Lymphocytes Relative: 15 %
Lymphs Abs: 1.2 10*3/uL (ref 0.7–4.0)
MCH: 27.2 pg (ref 26.0–34.0)
MCHC: 31.8 g/dL (ref 30.0–36.0)
MCV: 85.6 fL (ref 80.0–100.0)
Monocytes Absolute: 0.6 10*3/uL (ref 0.1–1.0)
Monocytes Relative: 7 %
Neutro Abs: 6.1 10*3/uL (ref 1.7–7.7)
Neutrophils Relative %: 76 %
Platelets: 260 10*3/uL (ref 150–400)
RBC: 3.34 MIL/uL — ABNORMAL LOW (ref 3.87–5.11)
RDW: 18.7 % — ABNORMAL HIGH (ref 11.5–15.5)
WBC: 8.1 10*3/uL (ref 4.0–10.5)
nRBC: 0 % (ref 0.0–0.2)

## 2023-06-05 LAB — URINALYSIS, ROUTINE W REFLEX MICROSCOPIC
Bacteria, UA: NONE SEEN
Bilirubin Urine: NEGATIVE
Glucose, UA: NEGATIVE mg/dL
Hgb urine dipstick: NEGATIVE
Ketones, ur: NEGATIVE mg/dL
Nitrite: NEGATIVE
Protein, ur: NEGATIVE mg/dL
Specific Gravity, Urine: 1.03 (ref 1.005–1.030)
pH: 7 (ref 5.0–8.0)

## 2023-06-05 LAB — TROPONIN I (HIGH SENSITIVITY)
Troponin I (High Sensitivity): 54 ng/L — ABNORMAL HIGH (ref <18)
Troponin I (High Sensitivity): 39 ng/L — ABNORMAL HIGH (ref <18)

## 2023-06-05 LAB — SARS CORONAVIRUS 2 BY RT PCR: SARS Coronavirus 2 by RT PCR: NEGATIVE

## 2023-06-05 LAB — LACTIC ACID, PLASMA: Lactic Acid, Venous: 1.5 mmol/L (ref 0.5–1.9)

## 2023-06-05 LAB — D-DIMER, QUANTITATIVE: D-Dimer, Quant: 0.61 ug{FEU}/mL — ABNORMAL HIGH (ref 0.00–0.50)

## 2023-06-05 LAB — I-STAT CG4 LACTIC ACID, ED
Lactic Acid, Venous: 1.7 mmol/L (ref 0.5–1.9)
Lactic Acid, Venous: 1.5 mmol/L (ref 0.5–1.9)

## 2023-06-05 LAB — LIPASE, BLOOD: Lipase: 45 U/L (ref 11–51)

## 2023-06-05 MED ORDER — ACETAMINOPHEN 325 MG PO TABS: 650.0000 mg | ORAL_TABLET | Freq: Four times a day (QID) | ORAL | Status: DC | PRN

## 2023-06-05 MED ORDER — KCL IN DEXTROSE-NACL 20-5-0.9 MEQ/L-%-% IV SOLN
INTRAVENOUS | Status: DC
  Filled 2023-06-05 (×2): qty 1000

## 2023-06-05 MED ORDER — IRON SUCROSE 20 MG/ML IV SOLN
100.0000 mg | Freq: Once | INTRAVENOUS | Status: AC
  Administered 2023-06-05: 100 mg via INTRAVENOUS
  Filled 2023-06-05: qty 5

## 2023-06-05 MED ORDER — LEVOTHYROXINE SODIUM 75 MCG PO TABS
75.0000 ug | ORAL_TABLET | Freq: Every day | ORAL | Status: DC
  Administered 2023-06-05 – 2023-06-06 (×2): 75 ug via ORAL
  Filled 2023-06-05 (×2): qty 1

## 2023-06-05 MED ORDER — ENOXAPARIN SODIUM 40 MG/0.4ML IJ SOSY
40.0000 mg | PREFILLED_SYRINGE | INTRAMUSCULAR | Status: DC
  Administered 2023-06-05 – 2023-06-06 (×2): 40 mg via SUBCUTANEOUS
  Filled 2023-06-05 (×2): qty 0.4

## 2023-06-05 MED ORDER — SODIUM CHLORIDE 0.9 % IV BOLUS
1000.0000 mL | Freq: Once | INTRAVENOUS | Status: AC
  Administered 2023-06-05: 1000 mL via INTRAVENOUS

## 2023-06-05 MED ORDER — VENLAFAXINE HCL ER 75 MG PO CP24
75.0000 mg | ORAL_CAPSULE | Freq: Every day | ORAL | Status: DC
  Administered 2023-06-05 – 2023-06-06 (×2): 75 mg via ORAL
  Filled 2023-06-05 (×2): qty 1

## 2023-06-05 MED ORDER — IOHEXOL 300 MG/ML  SOLN
100.0000 mL | Freq: Once | INTRAMUSCULAR | Status: AC | PRN
  Administered 2023-06-05: 100 mL via INTRAVENOUS

## 2023-06-05 MED ORDER — QUETIAPINE FUMARATE 50 MG PO TABS
50.0000 mg | ORAL_TABLET | Freq: Every day | ORAL | Status: DC
  Administered 2023-06-05: 50 mg via ORAL
  Filled 2023-06-05: qty 1

## 2023-06-05 MED ORDER — ONDANSETRON HCL 4 MG/2ML IJ SOLN: 4.0000 mg | Freq: Four times a day (QID) | INTRAMUSCULAR | Status: DC | PRN

## 2023-06-05 MED ORDER — PANTOPRAZOLE SODIUM 40 MG PO TBEC
40.0000 mg | DELAYED_RELEASE_TABLET | Freq: Every day | ORAL | Status: DC
  Administered 2023-06-05 – 2023-06-06 (×2): 40 mg via ORAL
  Filled 2023-06-05 (×2): qty 1

## 2023-06-05 MED ORDER — ACETAMINOPHEN 650 MG RE SUPP: 650.0000 mg | Freq: Four times a day (QID) | RECTAL | Status: DC | PRN

## 2023-06-05 MED ORDER — FERROUS SULFATE 325 (65 FE) MG PO TABS
325.0000 mg | ORAL_TABLET | ORAL | Status: DC
  Administered 2023-06-05: 325 mg via ORAL
  Filled 2023-06-05: qty 1

## 2023-06-05 MED ORDER — ONDANSETRON HCL 4 MG PO TABS: 4.0000 mg | ORAL_TABLET | Freq: Four times a day (QID) | ORAL | Status: DC | PRN

## 2023-06-05 NOTE — H&P (Addendum)
PCP:   Latrelle Dodrill, MD   Chief Complaint:  Syncope collapse  HPI: This is a 62 year old female with past medical history of trigeminal neuralgia and CSF leak, sp MVD, and VP shunt 2021.  She has HTN, HLD, IDA, systolic ejection murmur - chronic, excessive daytime sleepiness, untreated H. pylori, recent incidental thyroid nodule.  She was recently admitted with chest pains.  She had a subsequent cardiology follow-up 05/12/2023 where echo done showed EF 70-75% (04/28/2023). CT Coronary  recommended evaluating nonatherosclerotic cause for chest pain  Patient presents today after she had an unwitnessed syncopal episode in the restroom.  Her only prodromal symptom was marked right sided abdominal pain.  Around 10 PM last night she went to the restroom and syncopized.  Her sister heard the commotion, went in and found her slumped over the sink unresponsive.  The patient quickly revived.  The patient does not remember the episode.  She denies prior chest pains.  She reports weakness, lethargy, diaphoresis and being cold after she revived and attempted to walk to her bedroom.  She was also mildly lightheaded.  The patient did denies nausea, vomiting, fevers or chills prior.  She states her appetite has been good.  She endorses weakness, lethargy, hypersomnolence, snoring, shortness of breath ongoing over a year.  New medications include p.o. iron, Protonix,  In the ER patient presenting blood pressure was normal at 113/76.  All subsequent blood pressures have been borderline, lowest SBP 75 during my interview.  Troponin started 54=>39.  EKG NSR QTc 489.  Previous troponins normal.  CT abdomen pelvis shows sliding hiatal hernia.  Patient received 1.5 L IV fluids in the ER.  Review of Systems:  Per HPI  Past Medical History: 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   Associated with high-grade dysplasia   Anxiety and depression    Breast cancer  (HCC) 2002   left breast cancer    Depression    Diverticulosis of colon    DVT (deep venous thrombosis) (HCC) 2021   provoked by surgery   Essential hypertension, benign    Family history of pancreatic cancer    GERD (gastroesophageal reflux disease)    Hepatitis    unsure of which type   Hiatal hernia    High grade squamous intraepithelial cervical dysplasia 05/2017   Associated with adenocarcinoma in situ   History of adenomatous polyp of colon    tubular adenoma's  02-08-2017   History of gastritis    History of left breast cancer 2002---- per pt no recurrence   dx DCIS --- s/p  left breast lumpectomy;  per pt radiation therapy completed same year and completed antiestrogen therapy    History of radiation therapy    2002   left breast   History of trigeminal neuralgia    Hypothyroidism    Internal hemorrhoids    Pain    right ear and jaw   Personal history of radiation therapy    Spinal stenosis    Spondylolisthesis    SVD (spontaneous vaginal delivery)    x 1   Tubular adenoma of colon    Past Surgical History:  Procedure Laterality Date   BREAST LUMPECTOMY Left 2002   ductal CA in situ   CERVICAL CONIZATION W/BX N/A 06/08/2017   Procedure: CONIZATION CERVIX WITH BIOPSY;  Surgeon: Dara Lords, MD;  Location: [facility];  Service: Gynecology;  Laterality: N/A;   CESAREAN  SECTION  1990   twins   COLONOSCOPY  02-08-2017   dr Philbert Riser   CRANIECTOMY     shunt   DILATATION & CURETTAGE/HYSTEROSCOPY WITH MYOSURE N/A 06/08/2017   Procedure: DILATATION & CURETTAGE/HYSTEROSCOPY WITH MYOSURE;  Surgeon: Dara Lords, MD;  Location: [facility];  Service: Gynecology;  Laterality: N/A;   ESOPHAGOGASTRODUODENOSCOPY  last one 11-24-2016   dr Philbert Riser   ROBOTIC ASSISTED TOTAL HYSTERECTOMY WITH BILATERAL SALPINGO OOPHERECTOMY N/A 09/21/2017   Procedure: XI ROBOTIC ASSISTED TOTAL HYSTERECTOMY WITH BILATERAL SALPINGO OOPHORECTOMY, Lysis of  Adhesions;  Surgeon: Genia Del, MD;  Location: [facility];  Service: Gynecology;  Laterality: N/A;   TOOTH EXTRACTION     TRANSFORAMINAL LUMBAR INTERBODY FUSION (TLIF) WITH PEDICLE SCREW FIXATION 2 LEVEL N/A 04/13/2018   Procedure: RIGHT-SIDED LUMBAR 4-5 AND LUMBAR 5-SACRUM 1 TRANSFORAMINAL LUMBAR INTERBODY FUSION WITH INSTRUMENTATION AND ALLOGRAFT;  Surgeon: Estill Bamberg, MD;  Location: [facility];  Service: Orthopedics;  Laterality: N/A;   WISDOM TOOTH EXTRACTION      Medications: Prior to Admission medications   Medication Sig Start Date End Date Taking? Authorizing Provider  acetaminophen (TYLENOL) 325 MG tablet Take 650 mg by mouth every 6 (six) hours as needed. OTC PRN   Yes [provider]  Calcium Citrate-Vitamin D (CALCIUM + D PO) Take 1 tablet by mouth daily.   Yes [provider]  Cyanocobalamin (B-12 PO) Take by mouth.   Yes [provider]  ferrous sulfate 325 (65 FE) MG tablet Take 1 tablet (325 mg total) by mouth every other day. 05/17/23  Yes Latrelle Dodrill, MD  levothyroxine (SYNTHROID) 75 MCG tablet TAKE 1 TABLET (75 MCG TOTAL) BY MOUTH EVERY MORNING. 30 MINUTES BEFORE FOOD 02/14/23  Yes Latrelle Dodrill, MD  lisinopril (ZESTRIL) 20 MG tablet TAKE 1 TABLET BY MOUTH EVERY DAY 04/13/23  Yes Latrelle Dodrill, MD  pantoprazole (PROTONIX) 40 MG tablet Take 1 tablet (40 mg total) by mouth daily. 04/19/23  Yes Mabe, Earvin Hansen, MD  QUEtiapine (SEROQUEL) 50 MG tablet Take 1 tablet (50 mg total) by mouth at bedtime. 05/12/23  Yes Latrelle Dodrill, MD  triamterene-hydrochlorothiazide (DYAZIDE) 37.5-25 MG capsule TAKE 1 CAPSULE BY MOUTH EVERY DAY 09/24/22  Yes Latrelle Dodrill, MD  venlafaxine XR (EFFEXOR-XR) 75 MG 24 hr capsule TAKE THREE CAPSULES BY MOUTH ONCE DAILY 04/26/23  Yes Latrelle Dodrill, MD  metoprolol tartrate (LOPRESSOR) 50 MG tablet Take 1 tablet (50 mg total) by mouth once for 1 dose. 05/12/23 05/12/23  Marykay Lex, MD     Allergies:  Not on File  Social History:  reports that she has never smoked. She has never used smokeless tobacco. She reports that she does not drink alcohol and does not use drugs.  Family History: Family History  Problem Relation Age of Onset   Diabetes Mother    Hypertension Mother    Hyperlipidemia Father    Heart disease Father        Pacemaker   Dementia Father    Thyroid disease Sister    Schizophrenia Brother    Pancreatic cancer Cousin 28    Physical Exam: Vitals:   06/04/23 2237 06/05/23 0000 06/05/23 0201 06/05/23 0315  BP: 113/60 96/61 (!) 94/52 (!) 93/42  Pulse: 68 72 83 71  Resp: 18 14 (!) 23 15  Temp: 98 F (36.7 C)  98.2 F (36.8 C)   TempSrc: Oral  Oral   SpO2: 98% 98% 100% 94%    General:  Alert and oriented times three, well developed and nourished, no acute distress Eyes: PERRLA, pink conjunctiva, no scleral icterus ENT: Moist oral mucosa, neck supple, no thyromegaly Lungs: CTA B/L, no wheeze, no crackles, no use of accessory muscles Cardiovascular: RRR, positive SEM. No carotid bruits, no JVD Abdomen: soft, positive BS, mild reproducible TTP right side. not an acute abdomen GU: not examined Neuro: CN II - XII grossly intact, sensation intact Musculoskeletal: strength 5/5 all extremities, no edema Skin: no rash, no subcutaneous crepitation, no decubitus Psych: appropriate patient   Labs on Admission:  Recent Labs    06/05/23 0030  NA 138  K 3.5  CL 103  CO2 26  GLUCOSE 110*  BUN 25*  CREATININE 0.90  CALCIUM 9.3   Recent Labs    06/05/23 0030  AST 22  ALT 19  ALKPHOS 49  BILITOT 0.4  PROT 7.0  ALBUMIN 3.7   Recent Labs    06/05/23 0030  LIPASE 45   Recent Labs    06/05/23 0030  WBC 8.1  NEUTROABS 6.1  HGB 9.1*  HCT 28.6*  MCV 85.6  PLT 260    Radiological Exams on Admission: CT ABDOMEN PELVIS W CONTRAST  Result Date: 06/05/2023 CLINICAL DATA:  RLQ abdominal pain Spanish speaking, seen pcp about a week  ago, some sob and weakness. Using restroom, had acute right side abdominal pain, passed out. 1 episode emsis, hx of fatty liver. EXAM: CT ABDOMEN AND PELVIS WITH CONTRAST TECHNIQUE: Multidetector CT imaging of the abdomen and pelvis was performed using the standard protocol following bolus administration of intravenous contrast. RADIATION DOSE REDUCTION: This exam was performed according to the departmental dose-optimization program which includes automated exposure control, adjustment of the mA and/or kV according to patient size and/or use of iterative reconstruction technique. CONTRAST:  OMNIPAQUE IOHEXOL 300 MG/ML  SOLN COMPARISON:  CT abdomen pelvis 12/01/2021 FINDINGS: Lower chest: Partially visualized large hiatal hernia containing the majority of the stomach. Hepatobiliary: The hepatic parenchyma is diffusely hypodense compared to the splenic parenchyma consistent with fatty infiltration. No focal liver abnormality. No gallstones, gallbladder wall thickening, or pericholecystic fluid. No biliary dilatation. Pancreas: No focal lesion. Normal pancreatic contour. No surrounding inflammatory changes. No main pancreatic ductal dilatation. Spleen: Normal in size without focal abnormality. Splenules are noted. Adrenals/Urinary Tract: No adrenal nodule bilaterally. Bilateral kidneys enhance symmetrically. No hydronephrosis. No hydroureter. The urinary bladder is unremarkable. On delayed imaging, there is no urothelial wall thickening and there are no filling defects in the opacified portions of the bilateral collecting systems or ureters. Stomach/Bowel: Stomach is within normal limits. No evidence of bowel wall thickening or dilatation. No pneumatosis. Appendix appears normal. Vascular/Lymphatic: No abdominal aorta or iliac aneurysm. Mild atherosclerotic plaque of the aorta and its branches. No abdominal, pelvic, or inguinal lymphadenopathy. Reproductive: Status post hysterectomy. No adnexal masses. Other:  Likely peritoneal dialysis catheter or VP shunt with tip terminating in the pelvis. No intraperitoneal free fluid. No intraperitoneal free gas. No organized fluid collection. Musculoskeletal: No abdominal wall hernia or abnormality. No suspicious lytic or blastic osseous lesions. No acute displaced fracture. A port S1 posterolateral and interbody surgical hardware fusion. IMPRESSION: 1. Partially visualized large hiatal hernia containing the majority of the stomach. No associated findings of bowel obstruction or ischemia. 2. Hepatic steatosis. 3. No acute intra-abdominal or intrapelvic abnormality. Electronically Signed   By: Tish Frederickson M.D.   On: 06/05/2023 04:01    Assessment/Plan Present on Admission:  Syncope and collapse //  Hypotension -  Unclear etiology for patient's hypotension.  UA, chest x-ray, lactic acid, COVID panel, D-dimer added.  However, no clear source of infection.  Await results to initiation of antibiotics -Syncope likely secondary to hypotension.  Continue IV fluid hydration with LR.  Elevated troponin, new -Patient with recent cardiology visit 9/26.  Echo and CTA coronary done.  D-dimer ordered as patient complains of some shortness of breath. -EKG NSR -Patient with chronic systolic ejection murmur -Cardiology consult nit placed.  Defer to a.m. team if cardiology input needed.   Hypersomnolence -High suspicion of OSA.  Excessive snoring and interruptions in breathing per sister.  This could explain patient's fatigue, shortness of breath.   -Apnea monitor ordered   Hypothyroidism -TSH has been normal since 2021 -Synthroid resumed   Hypotension, in patient with history of hypertension -Lisinopril, metoprolol and diuretic on hold   GERD -Protonix resumed.  New medication   Depression -Effexor resumed.   -Cervical resume.  New medication   Incidental thyroid nodule -Ultrasound done, recommendation: No follow-up    Jessamy Torosyan 06/05/2023, 4:45 AM

## 2023-06-05 NOTE — Progress Notes (Signed)
Patient seen and examined personally, I reviewed the chart, history and physical and admission note, done by admitting physician this morning and agree with the same with following addendum.  Please refer to the morning admission note for more detailed plan of care.  Briefly, 62 yof w/ trigeminal neuralgia and CSF leak, sp MVD, and VP shunt 2021,HTN, HLD, IDA, systolic ejection murmur - chronic, excessive daytime sleepiness, untreated H. pylori, recent incidental thyroid nodule who was recently admitted with chest pain,and had subsequent cardiology follow-up 05/12/2023 where echo done showed EF 70-75% (04/28/2023). CT Coronary was done 1-/8-minimal plaque burden, and felt chest pain and shortness of breath not related to heard artery disease, who presented to ED after she had an unwitnessed syncopal episode in the restroom.Her only prodromal symptom was marked right sided abdominal pain.  Around 10 PM last night she went to the restroom and syncopized.  Her sister heard the commotion, went in and found her slumped over the sink unresponsive.The patient quickly revived.  The patient does not remember the episode.  She denies prior chest pains.  She reports weakness, lethargy, diaphoresis and being cold after she revived and attempted to walk to her bedroom.  She was also mildly lightheaded.  The patient did denies nausea, vomiting, fevers or chills prior.  She states her appetite has been good.  She endorses weakness, lethargy, hypersomnolence, snoring, shortness of breath ongoing over a year.  New medications include p.o. iron, Protonix, In the ED: BP-normal at 113/76, then borderline,lowest SBP 75.Troponin + 54=>39.  EKG NSR QTc 489.  Previous troponins normal. CT abdomen pelvis w/ contrast >>sliding hiatal hernia.  Patient received 1.5 L IV fluids in the ER. Further workups; lactic acid 1.5> 1.5, hb 9/1, normal wbc.  UA wbc 6-10 D dimer 0.6 borderline-adjusted for her age is normal.  Patient denies any chest  pain shortness of breath pleuritic symptoms, will obtain duplex of lower extremities prior to discharge. Seen by cardiology, no further workup likely vasovagal syncope.  IV iron Venofer was ordered for anemia. Troponin has been flat and downtrending and no chest pain.  She is feeling bit dizzy on standing. We will cont on ivf overnight. Duplex leg pending. Hold antihypertensive. See h and p for more detials

## 2023-06-05 NOTE — Discharge Summary (Signed)
Physician Discharge Summary  Sherry Anderson ZOX:[id number] DOB: [birth year] DOA: 06/04/2023  PCP: Latrelle Dodrill, MD  Admit date: 06/04/2023 Discharge date: 06/06/2023 Recommendations for Outpatient Follow-up:  Follow up with PCP in 1 weeks-call for appointment Please obtain BMP/CBC in one week  Discharge Dispo: Home Discharge Condition: Stable Code Status:   Code Status: Full Code Diet recommendation:  Diet Order             Diet Heart Room service appropriate? Yes; Fluid consistency: Thin  Diet effective now                    Brief/Interim Summary: 12 yof w/ trigeminal neuralgia and CSF leak, sp MVD, and VP shunt 2021,HTN, HLD, IDA, systolic ejection murmur - chronic, excessive daytime sleepiness, untreated H. pylori, recent incidental thyroid nodule who was recently admitted with chest pain,and had subsequent cardiology follow-up 05/12/2023 where echo done showed EF 70-75% (04/28/2023). CT Coronary was done 1-/8-minimal plaque burden, and felt chest pain and shortness of breath not related to heard artery disease, who presented to ED after she had an unwitnessed syncopal episode in the restroom.Her only prodromal symptom was marked right sided abdominal pain.  Around 10 PM last night she went to the restroom and syncopized.  Her sister heard the commotion, went in and found her slumped over the sink unresponsive.The patient quickly revived.  The patient does not remember the episode.  She denies prior chest pains.  She reports weakness, lethargy, diaphoresis and being cold after she revived and attempted to walk to her bedroom.  She was also mildly lightheaded.  The patient did denies nausea, vomiting, fevers or chills prior.  She states her appetite has been good.  She endorses weakness, lethargy, hypersomnolence, snoring, shortness of breath ongoing over a year.  New medications include p.o. iron, Protonix, In the ED: BP-normal at 113/76, then borderline,lowest SBP  75.Troponin + 54=>39.  EKG NSR QTc 489.  Previous troponins normal. CT abdomen pelvis w/ contrast >>sliding hiatal hernia.  Patient received 1.5 L IV fluids in the ER. Further workups; lactic acid 1.5> 1.5, hb 9/1, normal wbc.  UA wbc 6-10 D dimer 0.6 borderline-adjusted for her age is normal.  Patient denies any chest pain shortness of breath pleuritic symptoms, will obtain duplex of lower extremities prior to discharge. Seen by cardiology, no further workup likely vasovagal syncope.  IV iron Venofer was ordered for anemia. Troponin has been flat and downtrending and no chest pain. Patient now doing well symptomatically she did have drop in hemoglobin 7.7 g but no obvious bleeding hemoglobin downtrending of 8.2 g, anemia panel checked and unremarkable.  She had colonoscopy in past 2 years and unremarkable for her.  She is advised to follow-up with PCP and check CBC within a week no indication for transfusion at this time.  Her CT abdomen pelvis with contrast showing large hiatal hernia hepatic steatosis no acute finding    Discharge Diagnoses:  Principal Problem:   Syncope and collapse Active Problems:   Hyperlipidemia   HYPERTENSION, BENIGN ESSENTIAL   Thyroid nodule greater than or equal to 1 cm in diameter incidentally noted on imaging study   Large hiatal hernia   Elevated troponin  Vasovagal syncope: Unwitnessed syncope in the restroom likely related to using the restroom such as syncope also hypotension may have exacerbated.  At this time orthostatic vitals negative managed with IV fluid hydration, encourage oral intake.  Seen by cardiology no further workup at this time  Iron deficiency anemia  replaced with IV iron, she needs workup as outpatient.  Anemia panel Hemoccult ordered-but no stool to obtain Hemoccult however anemia panel unremarkable.  Hemoglobin now uptrending on recheck.  She reports she had colonoscopy in the past 2 years and unremarkable prior to that she had  polyp Recent Labs  Lab 06/05/23 0030 06/06/23 0602 06/06/23 1334  HGB 9.1* 7.7* 8.2*  HCT 28.6* 25.8* 26.8*    OSA: Current outpatient evaluation CAD mild nonobstructive with mildly borderline elevated troponin-no workup per cardiology Hypothyroidism continue Synthroid GERD continue PPI Depression: Mood stable Slightly elevated D-dimer but for her adjusted age it is ok at 0.6.  Duplex of the leg negative for DVT no chest pain or signs and symptoms of VTE.  Avoiding further IV contrast given concern  Consults: cardiology Subjective: Alert awake oriented resting comfortably  Discharge Exam: Vitals:   06/06/23 0542 06/06/23 1338  BP: (!) 101/51 137/68  Pulse: 71 76  Resp: 14 20  Temp: 98.5 F (36.9 C) 98.5 F (36.9 C)  SpO2: 98% 95%   General: Pt is alert, awake, not in acute distress Cardiovascular: RRR, S1/S2 +, no rubs, no gallops Respiratory: CTA bilaterally, no wheezing, no rhonchi Abdominal: Soft, NT, ND, bowel sounds + Extremities: no edema, no cyanosis  Discharge Instructions  Discharge Instructions     Discharge instructions   Complete by: As directed    Please follow-up with your primary care doctor you will need age-appropriate cancer screen including colonoscopy Hemoccult testing on your stool for anemia please discuss with your primary care doctor if not done already  Please call call MD or return to ER for similar or worsening recurring problem that brought you to hospital or if any fever,nausea/vomiting,abdominal pain, uncontrolled pain, chest pain,  shortness of breath or any other alarming symptoms.  Please follow-up your doctor as instructed in a week time and call the office for appointment.  Please avoid alcohol, smoking, or any other illicit substance and maintain healthy habits including taking your regular medications as prescribed.  You were cared for by a hospitalist during your hospital stay. If you have any questions about your discharge  medications or the care you received while you were in the hospital after you are discharged, you can call the unit and ask to speak with the hospitalist on call if the hospitalist that took care of you is not available.  Once you are discharged, your primary care physician will handle any further medical issues. Please note that NO REFILLS for any discharge medications will be authorized once you are discharged, as it is imperative that you return to your primary care physician (or establish a relationship with a primary care physician if you do not have one) for your aftercare needs so that they can reassess your need for medications and monitor your lab values   Discharge instructions   Complete by: As directed    Please call call MD or return to ER for similar or worsening recurring problem that brought you to hospital or if any fever,nausea/vomiting,abdominal pain, uncontrolled pain, chest pain,  shortness of breath or any other alarming symptoms.  Please follow-up your doctor as instructed in a week time and call the office for appointment.  Please avoid alcohol, smoking, or any other illicit substance and maintain healthy habits including taking your regular medications as prescribed.  You were cared for by a hospitalist during your hospital stay. If you have any questions about your discharge medications or the  care you received while you were in the hospital after you are discharged, you can call the unit and ask to speak with the hospitalist on call if the hospitalist that took care of you is not available.  Once you are discharged, your primary care physician will handle any further medical issues. Please note that NO REFILLS for any discharge medications will be authorized once you are discharged, as it is imperative that you return to your primary care physician (or establish a relationship with a primary care physician if you do not have one) for your aftercare needs so that they can  reassess your need for medications and monitor your lab values   Increase activity slowly   Complete by: As directed       Allergies as of 06/06/2023   Not on File      Medication List     STOP taking these medications    metoprolol tartrate 50 MG tablet Commonly known as: LOPRESSOR       TAKE these medications    acetaminophen 325 MG tablet Commonly known as: TYLENOL Take 650 mg by mouth every 6 (six) hours as needed. OTC PRN   B-12 PO Take by mouth.   CALCIUM + D PO Take 1 tablet by mouth daily.   ferrous sulfate 325 (65 FE) MG tablet Take 1 tablet (325 mg total) by mouth every other day.   levothyroxine 75 MCG tablet Commonly known as: SYNTHROID TAKE 1 TABLET (75 MCG TOTAL) BY MOUTH EVERY MORNING. 30 MINUTES BEFORE FOOD   lisinopril 20 MG tablet Commonly known as: ZESTRIL TAKE 1 TABLET BY MOUTH EVERY DAY   pantoprazole 40 MG tablet Commonly known as: PROTONIX Take 1 tablet (40 mg total) by mouth daily.   QUEtiapine 50 MG tablet Commonly known as: SEROquel Take 1 tablet (50 mg total) by mouth at bedtime.   triamterene-hydrochlorothiazide 37.5-25 MG capsule Commonly known as: DYAZIDE TAKE 1 CAPSULE BY MOUTH EVERY DAY   venlafaxine XR 75 MG 24 hr capsule Commonly known as: EFFEXOR-XR TAKE THREE CAPSULES BY MOUTH ONCE DAILY        Follow-up Information     Latrelle Dodrill, MD Follow up in 1 week(s).   Specialty: Family Medicine Contact information: [address] [place] [address] [phone]                Not on File  The results of significant diagnostics from this hospitalization (including imaging, microbiology, ancillary and laboratory) are listed below for reference.    Microbiology: Recent Results (from the past 240 hour(s))  SARS Coronavirus 2 by RT PCR (hospital order, performed in [facility] hospital lab) *cepheid single result test* Anterior Nasal Swab     Status: None   Collection Time: 06/05/23   5:42 AM   Specimen: Anterior Nasal Swab  Result Value Ref Range Status   SARS Coronavirus 2 by RT PCR NEGATIVE NEGATIVE Final    Comment: (NOTE) SARS-CoV-2 target nucleic acids are NOT DETECTED.  The SARS-CoV-2 RNA is generally detectable in upper and lower respiratory specimens during the acute phase of infection. The lowest concentration of SARS-CoV-2 viral copies this assay can detect is 250 copies / mL. A negative result does not preclude SARS-CoV-2 infection and should not be used as the sole basis for treatment or other patient management decisions.  A negative result may occur with improper specimen collection / handling, submission of specimen other than nasopharyngeal swab, presence of viral mutation(s) within the areas  targeted by this assay, and inadequate number of viral copies (<250 copies / mL). A negative result must be combined with clinical observations, patient history, and epidemiological information.  Fact Sheet for Patients:   RoadLapTop.co.za  Fact Sheet for Healthcare Providers: http://kim-miller.com/  This test is not yet approved or  cleared by the [place] FDA and has been authorized for detection and/or diagnosis of SARS-CoV-2 by FDA under an Emergency Use Authorization (EUA).  This EUA will remain in effect (meaning this test can be used) for the duration of the COVID-19 declaration under Section 564(b)(1) of the Act, 21 U.S.C. section 360bbb-3(b)(1), unless the authorization is terminated or revoked sooner.  Performed at [facility], [address], [place], [place] [address]     Procedures/Studies: VAS [place] LOWER EXTREMITY VENOUS (DVT)  Result Date: 06/06/2023  Lower Venous DVT Study Patient Name:  Sherry Anderson  Date of Exam:   06/06/2023 Medical Rec #: [id number]             Accession #:    [id number] Date of Birth: 11-02-1960             Patient Gender: F Patient Age:   14  years Exam Location:  [facility] Procedure:      VAS [place] LOWER EXTREMITY VENOUS (DVT) Referring Phys: Dyllen Menning --------------------------------------------------------------------------------  Indications: Edema.  Risk Factors: Past pregnancy. Comparison Study: No significant changes seen since prior exam 01/09/18 Performing Technologist: Shona Simpson  Examination Guidelines: A complete evaluation includes B-mode imaging, spectral Doppler, color Doppler, and power Doppler as needed of all accessible portions of each vessel. Bilateral testing is considered an integral part of a complete examination. Limited examinations for reoccurring indications may be performed as noted. The reflux portion of the exam is performed with the patient in reverse Trendelenburg.  +---------+---------------+---------+-----------+----------+--------------+ RIGHT    CompressibilityPhasicitySpontaneityPropertiesThrombus Aging +---------+---------------+---------+-----------+----------+--------------+ CFV      Full           Yes      Yes                                 +---------+---------------+---------+-----------+----------+--------------+ SFJ      Full                                                        +---------+---------------+---------+-----------+----------+--------------+ FV Prox  Full                                                        +---------+---------------+---------+-----------+----------+--------------+ FV Mid   Full                                                        +---------+---------------+---------+-----------+----------+--------------+ FV DistalFull                                                        +---------+---------------+---------+-----------+----------+--------------+  PFV      Full                                                        +---------+---------------+---------+-----------+----------+--------------+ POP      Full            Yes      Yes                                 +---------+---------------+---------+-----------+----------+--------------+ PTV      Full                                                        +---------+---------------+---------+-----------+----------+--------------+ PERO     Full                                                        +---------+---------------+---------+-----------+----------+--------------+   +---------+---------------+---------+-----------+----------+--------------+ LEFT     CompressibilityPhasicitySpontaneityPropertiesThrombus Aging +---------+---------------+---------+-----------+----------+--------------+ CFV      Full           Yes      Yes                                 +---------+---------------+---------+-----------+----------+--------------+ SFJ      Full                                                        +---------+---------------+---------+-----------+----------+--------------+ FV Prox  Full                                                        +---------+---------------+---------+-----------+----------+--------------+ FV Mid   Full                                                        +---------+---------------+---------+-----------+----------+--------------+ FV DistalFull                                                        +---------+---------------+---------+-----------+----------+--------------+ PFV      Full                                                        +---------+---------------+---------+-----------+----------+--------------+  POP      Full           Yes      Yes                                 +---------+---------------+---------+-----------+----------+--------------+ PTV      Full                                                        +---------+---------------+---------+-----------+----------+--------------+ PERO     Full                                                         +---------+---------------+---------+-----------+----------+--------------+     Summary: BILATERAL: - No evidence of deep vein thrombosis seen in the lower extremities, bilaterally. -No evidence of popliteal cyst, bilaterally.   *See table(s) above for measurements and observations.    Preliminary    DG CHEST PORT 1 VIEW  Result Date: 06/05/2023 CLINICAL DATA:  62 year old female history of shortness of breath. Weakness. EXAM: PORTABLE CHEST 1 VIEW COMPARISON:  Chest x-ray 04/15/2023. FINDINGS: Small bore tubing projecting over the left hemithorax and left cervical region, presumably ventriculoperitoneal shunt tubing. Lung volumes are normal. No consolidative airspace disease. No pleural effusions. No pneumothorax. No pulmonary nodule or mass noted. Large hiatal hernia. Pulmonary vasculature and the cardiomediastinal silhouette are otherwise within normal limits. IMPRESSION: 1. No radiographic evidence of acute cardiopulmonary disease. 2. Large hiatal hernia. Electronically Signed   By: Trudie Reed M.D.   On: 06/05/2023 06:57   CT ABDOMEN PELVIS W CONTRAST  Result Date: 06/05/2023 CLINICAL DATA:  RLQ abdominal pain Spanish speaking, seen pcp about a week ago, some sob and weakness. Using restroom, had acute right side abdominal pain, passed out. 1 episode emsis, hx of fatty liver. EXAM: CT ABDOMEN AND PELVIS WITH CONTRAST TECHNIQUE: Multidetector CT imaging of the abdomen and pelvis was performed using the standard protocol following bolus administration of intravenous contrast. RADIATION DOSE REDUCTION: This exam was performed according to the departmental dose-optimization program which includes automated exposure control, adjustment of the mA and/or kV according to patient size and/or use of iterative reconstruction technique. CONTRAST:  OMNIPAQUE IOHEXOL 300 MG/ML  SOLN COMPARISON:  CT abdomen pelvis 12/01/2021 FINDINGS: Lower chest: Partially visualized large hiatal hernia containing the  majority of the stomach. Hepatobiliary: The hepatic parenchyma is diffusely hypodense compared to the splenic parenchyma consistent with fatty infiltration. No focal liver abnormality. No gallstones, gallbladder wall thickening, or pericholecystic fluid. No biliary dilatation. Pancreas: No focal lesion. Normal pancreatic contour. No surrounding inflammatory changes. No main pancreatic ductal dilatation. Spleen: Normal in size without focal abnormality. Splenules are noted. Adrenals/Urinary Tract: No adrenal nodule bilaterally. Bilateral kidneys enhance symmetrically. No hydronephrosis. No hydroureter. The urinary bladder is unremarkable. On delayed imaging, there is no urothelial wall thickening and there are no filling defects in the opacified portions of the bilateral collecting systems or ureters. Stomach/Bowel: Stomach is within normal limits. No evidence of bowel wall thickening or dilatation. No pneumatosis. Appendix appears normal. Vascular/Lymphatic: No abdominal aorta or iliac aneurysm. Mild atherosclerotic plaque  of the aorta and its branches. No abdominal, pelvic, or inguinal lymphadenopathy. Reproductive: Status post hysterectomy. No adnexal masses. Other: Likely peritoneal dialysis catheter or VP shunt with tip terminating in the pelvis. No intraperitoneal free fluid. No intraperitoneal free gas. No organized fluid collection. Musculoskeletal: No abdominal wall hernia or abnormality. No suspicious lytic or blastic osseous lesions. No acute displaced fracture. A port S1 posterolateral and interbody surgical hardware fusion. IMPRESSION: 1. Partially visualized large hiatal hernia containing the majority of the stomach. No associated findings of bowel obstruction or ischemia. 2. Hepatic steatosis. 3. No acute intra-abdominal or intrapelvic abnormality. Electronically Signed   By: Tish Frederickson M.D.   On: 06/05/2023 04:01   CT CORONARY MORPH W/CTA COR W/SCORE W/CA W/CM &/OR WO/CM  Result Date:  05/26/2023 CLINICAL DATA:  Chest pain EXAM: Cardiac/Coronary CTA TECHNIQUE: A non-contrast, gated CT scan was obtained with axial slices of 3 mm through the heart for calcium scoring. Calcium scoring was performed using the Agatston method. A 120 kV prospective, gated, contrast cardiac scan was obtained. Gantry rotation speed was 250 msecs and collimation was 0.6 mm. Two sublingual nitroglycerin tablets (0.8 mg) were given. The 3D data set was reconstructed in 5% intervals of the 35-75% of the R-R cycle. Diastolic phases were analyzed on a dedicated workstation using MPR, MIP, and VRT modes. The patient received 95 cc of contrast. FINDINGS: Image quality: Excellent. Noise artifact is: Limited. Coronary Arteries:  Normal coronary origin.  Right dominance. Left main: The left main is a large caliber vessel with a normal take off from the left coronary cusp that bifurcates to form a left anterior descending artery and a left circumflex artery. There is no plaque or stenosis. Left anterior descending artery: The LAD is patent without evidence of plaque or stenosis. The LAD gives off 3 patent diagonal branches. Left circumflex artery: The LCX is non-dominant and patent with no evidence of plaque or stenosis. The LCX gives off a patent OM#1 branch and a large branching OM#2. There is no plaque or stenosis. Right coronary artery: The RCA is dominant with normal take off from the right coronary cusp. There is mild soft plaque in the proximal RCA with associated stenosis of 25-49%. The RCA terminates as a PDA and right posterolateral branch without evidence of plaque or stenosis. Right Atrium: Right atrial size is within normal limits. Right Ventricle: The right ventricular cavity is within normal limits. Left Atrium: Left atrial size is normal in size with no left atrial appendage filling defect. Left Ventricle: The ventricular cavity size is within normal limits. Pulmonary arteries: Normal in size. Pulmonary veins: Normal  pulmonary venous drainage. Pericardium: Normal thickness without significant effusion or calcium present. Cardiac valves: The aortic valve is trileaflet without significant calcification. The mitral valve is normal without significant calcification. Aorta: Normal caliber without significant disease. Extra-cardiac findings: See attached radiology report for non-cardiac structures. IMPRESSION: 1. Coronary calcium score of 0. This was 0 percentile for age-, sex, and race-matched controls. 2. Total plaque volume 130 mm3 which is 56th percentile for age- and sex-matched controls (calcified plaque 69mm3; non-calcified plaque 175mm3). TPV is mild. 3. Normal coronary origin with right dominance. 4. Mild atherosclerosis.  25-49% soft plaque in the proximal RCA. 5. Recommend preventive therapy and risk factor modification. 6. Consider non atherosclerotic causes of chest pain. RECOMMENDATIONS: 1. CAD-RADS 0: No evidence of CAD (0%). Consider non-atherosclerotic causes of chest pain. 2. CAD-RADS 1: Minimal non-obstructive CAD (0-24%). Consider non-atherosclerotic causes of chest pain. Consider preventive  therapy and risk factor modification. 3. CAD-RADS 2: Mild non-obstructive CAD (25-49%). Consider non-atherosclerotic causes of chest pain. Consider preventive therapy and risk factor modification. 4. CAD-RADS 3: Moderate stenosis. Consider symptom-guided anti-ischemic pharmacotherapy as well as risk factor modification per guideline directed care. Additional analysis with CT FFR will be submitted. 5. CAD-RADS 4: Severe stenosis. (70-99% or > 50% left main). Cardiac catheterization or CT FFR is recommended. Consider symptom-guided anti-ischemic pharmacotherapy as well as risk factor modification per guideline directed care. Invasive coronary angiography recommended with revascularization per published guideline statements. 6. CAD-RADS 5: Total coronary occlusion (100%). Consider cardiac catheterization or viability assessment.  Consider symptom-guided anti-ischemic pharmacotherapy as well as risk factor modification per guideline directed care. 7. CAD-RADS N: Non-diagnostic study. Obstructive CAD can't be excluded. Alternative evaluation is recommended. Armanda Magic, MD Electronically Signed   By: Armanda Magic M.D.   On: 05/26/2023 12:43    Labs: BNP (last 3 results) No results for input(s): "BNP" in the last 8760 hours. Basic Metabolic Panel: Recent Labs  Lab 06/05/23 0030 06/06/23 0602  NA 138 140  K 3.5 3.6  CL 103 111  CO2 26 21*  GLUCOSE 110* 106*  BUN 25* 14  CREATININE 0.90 0.85  CALCIUM 9.3 8.0*   Liver Function Tests: Recent Labs  Lab 06/05/23 0030  AST 22  ALT 19  ALKPHOS 49  BILITOT 0.4  PROT 7.0  ALBUMIN 3.7   Recent Labs  Lab 06/05/23 0030  LIPASE 45   No results for input(s): "AMMONIA" in the last 168 hours. CBC: Recent Labs  Lab 06/05/23 0030 06/06/23 0602 06/06/23 1334  WBC 8.1 4.7  --   NEUTROABS 6.1  --   --   HGB 9.1* 7.7* 8.2*  HCT 28.6* 25.8* 26.8*  MCV 85.6 89.9  --   PLT 260 209  --    Cardiac Enzymes: No results for input(s): "CKTOTAL", "CKMB", "CKMBINDEX", "TROPONINI" in the last 168 hours. BNP: Invalid input(s): "POCBNP" CBG: No results for input(s): "GLUCAP" in the last 168 hours. D-Dimer Recent Labs    06/05/23 0628  DDIMER 0.61*   Hgb A1c No results for input(s): "HGBA1C" in the last 72 hours. Lipid Profile No results for input(s): "CHOL", "HDL", "LDLCALC", "TRIG", "CHOLHDL", "LDLDIRECT" in the last 72 hours. Thyroid function studies No results for input(s): "TSH", "T4TOTAL", "T3FREE", "THYROIDAB" in the last 72 hours.  Invalid input(s): "FREET3" Anemia work up Recent Labs    06/06/23 0602 06/06/23 0936  VITAMINB12  --  6,278*  FOLATE  --  9.6  FERRITIN  --  38  TIBC  --  441  IRON  --  96  RETICCTPCT 5.1*  --    Urinalysis    Component Value Date/Time   COLORURINE STRAW (A) 06/05/2023 0438   APPEARANCEUR CLEAR 06/05/2023 0438    LABSPEC 1.030 06/05/2023 0438   PHURINE 7.0 06/05/2023 0438   GLUCOSEU NEGATIVE 06/05/2023 0438   HGBUR NEGATIVE 06/05/2023 0438   HGBUR negative 12/24/2008 1552   BILIRUBINUR NEGATIVE 06/05/2023 0438   BILIRUBINUR Negative 03/12/2021 1159   KETONESUR NEGATIVE 06/05/2023 0438   PROTEINUR NEGATIVE 06/05/2023 0438   UROBILINOGEN 0.2 03/12/2021 1159   UROBILINOGEN 0.2 12/24/2008 1552   NITRITE NEGATIVE 06/05/2023 0438   LEUKOCYTESUR MODERATE (A) 06/05/2023 0438   Sepsis Labs Recent Labs  Lab 06/05/23 0030 06/06/23 0602  WBC 8.1 4.7   Microbiology Recent Results (from the past 240 hour(s))  SARS Coronavirus 2 by RT PCR (hospital order, performed in [facility]  hospital lab) *cepheid single result test* Anterior Nasal Swab     Status: None   Collection Time: 06/05/23  5:42 AM   Specimen: Anterior Nasal Swab  Result Value Ref Range Status   SARS Coronavirus 2 by RT PCR NEGATIVE NEGATIVE Final    Comment: (NOTE) SARS-CoV-2 target nucleic acids are NOT DETECTED.  The SARS-CoV-2 RNA is generally detectable in upper and lower respiratory specimens during the acute phase of infection. The lowest concentration of SARS-CoV-2 viral copies this assay can detect is 250 copies / mL. A negative result does not preclude SARS-CoV-2 infection and should not be used as the sole basis for treatment or other patient management decisions.  A negative result may occur with improper specimen collection / handling, submission of specimen other than nasopharyngeal swab, presence of viral mutation(s) within the areas targeted by this assay, and inadequate number of viral copies (<250 copies / mL). A negative result must be combined with clinical observations, patient history, and epidemiological information.  Fact Sheet for Patients:   RoadLapTop.co.za  Fact Sheet for Healthcare Providers: http://kim-miller.com/  This test is not yet approved or   cleared by the [place] FDA and has been authorized for detection and/or diagnosis of SARS-CoV-2 by FDA under an Emergency Use Authorization (EUA).  This EUA will remain in effect (meaning this test can be used) for the duration of the COVID-19 declaration under Section 564(b)(1) of the Act, 21 U.S.C. section 360bbb-3(b)(1), unless the authorization is terminated or revoked sooner.  Performed at [facility], [address] [place], [place] [address]    Time coordinating discharge: 25 minutes  SIGNED: Lanae Boast, MD  [facility] 06/06/2023, 2:07 PM  If 7PM-7AM, please contact night-coverage [email or web address]

## 2023-06-05 NOTE — Hospital Course (Addendum)
43 yof w/ trigeminal neuralgia and CSF leak, sp MVD, and VP shunt 2021,HTN, HLD, IDA, systolic ejection murmur - chronic, excessive daytime sleepiness, untreated H. pylori, recent incidental thyroid nodule who was recently admitted with chest pain,and had subsequent cardiology follow-up 05/12/2023 where echo done showed EF 70-75% (04/28/2023). CT Coronary was done 1-/8-minimal plaque burden, and felt chest pain and shortness of breath not related to heard artery disease, who presented to ED after she had an unwitnessed syncopal episode in the restroom.Her only prodromal symptom was marked right sided abdominal pain.  Around 10 PM last night she went to the restroom and syncopized.  Her sister heard the commotion, went in and found her slumped over the sink unresponsive.The patient quickly revived.  The patient does not remember the episode.  She denies prior chest pains.  She reports weakness, lethargy, diaphoresis and being cold after she revived and attempted to walk to her bedroom.  She was also mildly lightheaded.  The patient did denies nausea, vomiting, fevers or chills prior.  She states her appetite has been good.  She endorses weakness, lethargy, hypersomnolence, snoring, shortness of breath ongoing over a year.  New medications include p.o. iron, Protonix, In the ED: BP-normal at 113/76, then borderline,lowest SBP 75.Troponin + 54=>39.  EKG NSR QTc 489.  Previous troponins normal. CT abdomen pelvis w/ contrast >>sliding hiatal hernia.  Patient received 1.5 L IV fluids in the ER. Further workups; lactic acid 1.5> 1.5, hb 9/1, normal wbc.  UA wbc 6-10 D dimer 0.6 borderline-adjusted for her age is normal.  Patient denies any chest pain shortness of breath pleuritic symptoms, will obtain duplex of lower extremities prior to discharge. Seen by cardiology, no further workup likely vasovagal syncope.  IV iron Venofer was ordered for anemia. Troponin has been flat and downtrending and no chest pain. Patient  now doing well symptomatically she did have drop in hemoglobin 7.7 g but no obvious bleeding hemoglobin downtrending of 8.2 g, anemia panel checked and unremarkable.  She had colonoscopy in past 2 years and unremarkable for her.  She is advised to follow-up with PCP and check CBC within a week no indication for transfusion at this time.  Her CT abdomen pelvis with contrast showing large hiatal hernia hepatic steatosis no acute finding

## 2023-06-05 NOTE — ED Provider Notes (Signed)
Emergency Department Provider Note   I have reviewed the triage vital signs and the nursing notes.   HISTORY  Chief Complaint Loss of Consciousness   HPI Sherry Anderson is a 62 y.o. female presents emergency department for evaluation of syncope while on the toilet.  She tells me she has had some on and off right side abdominal pain throughout the day but nothing severe.  She was on the toilet when she felt like the pain got worse and next remembers waking up on the floor.  Family, at bedside, reported hearing her fall and went to check on her.  No witnessed seizure activity.  She quickly regained consciousness.  She does not recall CP, SOB, or palpitations.    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   Associated with high-grade dysplasia   Anxiety and depression    Breast cancer (HCC) 2002   left breast cancer    Depression    Diverticulosis of colon    DVT (deep venous thrombosis) (HCC) 2021   provoked by surgery   Essential hypertension, benign    Family history of pancreatic cancer    GERD (gastroesophageal reflux disease)    Hepatitis    unsure of which type   Hiatal hernia    High grade squamous intraepithelial cervical dysplasia 05/2017   Associated with adenocarcinoma in situ   History of adenomatous polyp of colon    tubular adenoma's  02-08-2017   History of gastritis    History of left breast cancer 2002---- per pt no recurrence   dx DCIS --- s/p  left breast lumpectomy;  per pt radiation therapy completed same year and completed antiestrogen therapy    History of radiation therapy    2002   left breast   History of trigeminal neuralgia    Hypothyroidism    Internal hemorrhoids    Pain    right ear and jaw   Personal history of radiation therapy    Spinal stenosis    Spondylolisthesis    SVD (spontaneous vaginal delivery)    x 1   Tubular adenoma of colon     Review of  Systems  Constitutional: No fever/chills Cardiovascular: Denies chest pain. Positive syncope.  Respiratory: Denies shortness of breath. Gastrointestinal: Positive right abdominal pain.  No nausea, no vomiting.  No diarrhea.  No constipation. Genitourinary: Negative for dysuria. Musculoskeletal: Negative for back pain. Skin: Negative for rash. Neurological: Negative for headaches, focal weakness or numbness.  ____________________________________________   PHYSICAL EXAM:  VITAL SIGNS: ED Triage Vitals [06/04/23 2237]  Encounter Vitals Group     BP 113/60     Pulse Rate 68     Resp 18     Temp 98 F (36.7 C)     Temp Source Oral     SpO2 98 %   Constitutional: Alert and oriented. Well appearing and in no acute distress. Eyes: Conjunctivae are normal.  Head: Atraumatic. Nose: No congestion/rhinnorhea. Mouth/Throat: Mucous membranes are moist.  Neck: No stridor.  Cardiovascular: Normal rate, regular rhythm. Good peripheral circulation. Grossly normal heart sounds.   Respiratory: Normal respiratory effort.  No retractions. Lungs CTAB. Gastrointestinal: Soft with mild right side abdominal tenderness. No peritonitis. No distention.  Musculoskeletal: No lower extremity tenderness nor edema. No gross deformities of extremities. Neurologic:  Normal speech and language. No gross focal neurologic deficits are appreciated.  Skin:  Skin is warm, dry and intact. No rash noted.  ____________________________________________   LABS (all labs ordered are listed, but only abnormal results are displayed)  Labs Reviewed  COMPREHENSIVE METABOLIC PANEL - Abnormal; Notable for the following components:      Result Value   Glucose, Bld 110 (*)    BUN 25 (*)    All other components within normal limits  CBC WITH DIFFERENTIAL/PLATELET - Abnormal; Notable for the following components:   RBC 3.34 (*)    Hemoglobin 9.1 (*)    HCT 28.6 (*)    RDW 18.7 (*)    Abs Immature Granulocytes 0.08 (*)     All other components within normal limits  URINALYSIS, ROUTINE W REFLEX MICROSCOPIC - Abnormal; Notable for the following components:   Color, Urine STRAW (*)    Leukocytes,Ua MODERATE (*)    All other components within normal limits  D-DIMER, QUANTITATIVE - Abnormal; Notable for the following components:   D-Dimer, Quant 0.61 (*)    All other components within normal limits  BASIC METABOLIC PANEL - Abnormal; Notable for the following components:   CO2 21 (*)    Glucose, Bld 106 (*)    Calcium 8.0 (*)    All other components within normal limits  CBC - Abnormal; Notable for the following components:   RBC 2.87 (*)    Hemoglobin 7.7 (*)    HCT 25.8 (*)    MCHC 29.8 (*)    RDW 20.2 (*)    All other components within normal limits  VITAMIN B12 - Abnormal; Notable for the following components:   Vitamin B-12 6,278 (*)    All other components within normal limits  RETICULOCYTES - Abnormal; Notable for the following components:   Retic Ct Pct 5.1 (*)    RBC. 2.83 (*)    Immature Retic Fract 30.5 (*)    All other components within normal limits  HEMOGLOBIN AND HEMATOCRIT, BLOOD - Abnormal; Notable for the following components:   Hemoglobin 8.2 (*)    HCT 26.8 (*)    All other components within normal limits  TROPONIN I (HIGH SENSITIVITY) - Abnormal; Notable for the following components:   Troponin I (High Sensitivity) 54 (*)    All other components within normal limits  TROPONIN I (HIGH SENSITIVITY) - Abnormal; Notable for the following components:   Troponin I (High Sensitivity) 39 (*)    All other components within normal limits  SARS CORONAVIRUS 2 BY RT PCR  LIPASE, BLOOD  LACTIC ACID, PLASMA  FOLATE  IRON AND TIBC  FERRITIN  I-STAT CG4 LACTIC ACID, ED  I-STAT CG4 LACTIC ACID, ED   ____________________________________________  EKG  Rate: 69 PR: 147 QTc: 489  Sinus rhythm. Left axis deviation, T wave flattening inferiorly.    ____________________________________________   PROCEDURES  Procedure(s) performed:   Procedures  CRITICAL CARE Performed by: Maia Plan Total critical care time: 35 minutes Critical care time was exclusive of separately billable procedures and treating other patients. Critical care was necessary to treat or prevent imminent or life-threatening deterioration. Critical care was time spent personally by me on the following activities: development of treatment plan with patient and/or surrogate as well as nursing, discussions with consultants, evaluation of patient's response to treatment, examination of patient, obtaining history from patient or surrogate, ordering and performing treatments and interventions, ordering and review of laboratory studies, ordering and review of radiographic studies, pulse oximetry and re-evaluation of patient's condition.  Alona Bene, MD Emergency Medicine  ____________________________________________   INITIAL IMPRESSION / ASSESSMENT AND PLAN / ED  COURSE  Pertinent labs & imaging results that were available during my care of the patient were reviewed by me and considered in my medical decision making (see chart for details).   This patient is Presenting for Evaluation of syncope, which does require a range of treatment options, and is a complaint that involves a high risk of morbidity and mortality.  The Differential Diagnoses includes but is not exclusive to acute coronary syndrome, aortic dissection, pulmonary embolism, cardiac tamponade, community-acquired pneumonia, pericarditis, musculoskeletal chest wall pain, etc.   Critical Interventions-    Medications  sodium chloride 0.9 % bolus 500 mL (0 mLs Intravenous Stopped 06/05/23 0335)  iohexol (OMNIPAQUE) 300 MG/ML solution 100 mL (100 mLs Intravenous Contrast Given 06/05/23 0144)  sodium chloride 0.9 % bolus 1,000 mL (0 mLs Intravenous Stopped 06/05/23 0844)  iron sucrose (VENOFER) 100 mg  in sodium chloride 0.9 % 100 mL IVPB (100 mg Intravenous New Bag/Given 06/05/23 1359)    Reassessment after intervention:  hypotension improved with IVF.    I did obtain Additional Historical Information from family at bedside.    Clinical Laboratory Tests Ordered, included CBC with mild anemia at 9.1. No AKI.   Radiologic Tests Ordered, included CT abdominal pain. I independently interpreted the images and agree with radiology interpretation.   Cardiac Monitor Tracing which shows NSR. No ectopy.    Social Determinants of Health Risk patient is a non-smoker.   Consult complete with [facility]. Plan for admit for IVF and continued telemetry monitoring.   Medical Decision Making: Summary:  Patient presents emergency department with syncope while on the toilet.  She had some associated right-sided abdominal pain.  No peritonitis on exam. Mild right abdominal tenderness. Plan for CT, labs, telemetry, and reassess after IVF.   Reevaluation with update and discussion with patient. CT reassuring. Plan for IVF and admit for observation.   Patient's presentation is most consistent with acute presentation with potential threat to life or bodily function.   Disposition: admit  ____________________________________________  FINAL CLINICAL IMPRESSION(S) / ED DIAGNOSES  Final diagnoses:  Syncope and collapse  Right lateral abdominal pain      Note:  This document was prepared using Dragon voice recognition software and may include unintentional dictation errors.  Alona Bene, MD, [facility]    [place] Nellums, Arlyss Repress, MD 06/08/23 [phone]

## 2023-06-05 NOTE — ED Notes (Signed)
ED TO INPATIENT HANDOFF REPORT  Name/Age/Gender Sherry Anderson 62 y.o. female  Code Status    Code Status Orders  (From admission, onward)           Start     Ordered   06/05/23 0608  Full code  Continuous       Question:  By:  Answer:  Consent: discussion documented in EHR   06/05/23 0608           Code Status History     Date Active Date Inactive Code Status Order ID Comments User Context   04/13/2018 2041 04/14/2018 1807 Full Code [id number]  Fanny Skates Inpatient   09/21/2017 1348 09/22/2017 1214 Full Code [id number]  Genia Del, MD Inpatient       Home/SNF/Other Home  Chief Complaint Syncope and collapse [R55]  Level of Care/Admitting Diagnosis ED Disposition     ED Disposition  Admit   Condition  --   Comment  Hospital Area: [facility] [place] [[id number]]  Level of Care: Telemetry [5]  Admit to tele based on following criteria: Other see comments  Comments: syncope  May place patient in observation at [facility] or [facility] if equivalent level of care is available:: Yes  Covid Evaluation: Symptomatic Person Under Investigation (PUI) or recent exposure (last 10 days) *Testing Required*  Diagnosis: Syncope and collapse [780.2.ICD-9-CM]  Admitting Physician: Gery Pray [[id number]]  Attending Physician: Gery Pray [[id number]]          Medical History 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   Associated with high-grade dysplasia   Anxiety and depression    Breast cancer (HCC) 2002   left breast cancer    Depression    Diverticulosis of colon    DVT (deep venous thrombosis) (HCC) 2021   provoked by surgery   Essential hypertension, benign    Family history of pancreatic cancer    GERD (gastroesophageal reflux disease)    Hepatitis    unsure of which type   Hiatal hernia    High grade squamous intraepithelial cervical dysplasia 05/2017   Associated with  adenocarcinoma in situ   History of adenomatous polyp of colon    tubular adenoma's  02-08-2017   History of gastritis    History of left breast cancer 2002---- per pt no recurrence   dx DCIS --- s/p  left breast lumpectomy;  per pt radiation therapy completed same year and completed antiestrogen therapy    History of radiation therapy    2002   left breast   History of trigeminal neuralgia    Hypothyroidism    Internal hemorrhoids    Pain    right ear and jaw   Personal history of radiation therapy    Spinal stenosis    Spondylolisthesis    SVD (spontaneous vaginal delivery)    x 1   Tubular adenoma of colon     Allergies Not on File  IV Location/Drains/Wounds Patient Lines/Drains/Airways Status     Active Line/Drains/Airways     Name Placement date Placement time Site Days   Peripheral IV 06/04/23 20 G 1" Posterior;Right Hand 06/04/23  --  Hand  1            Labs/Imaging Results for orders placed or performed during the hospital encounter of 06/04/23 (from the past 48 hour(s))  Comprehensive metabolic panel     Status: Abnormal   Collection Time: 06/05/23 12:30 AM  Result Value Ref Range   Sodium 138 135 - 145 mmol/L   Potassium 3.5 3.5 - 5.1 mmol/L   Chloride 103 98 - 111 mmol/L   CO2 26 22 - 32 mmol/L   Glucose, Bld 110 (H) 70 - 99 mg/dL    Comment: Glucose reference range applies only to samples taken after fasting for at least 8 hours.   BUN 25 (H) 8 - 23 mg/dL   Creatinine, Ser 0.86 0.44 - 1.00 mg/dL   Calcium 9.3 8.9 - 57.8 mg/dL   Total Protein 7.0 6.5 - 8.1 g/dL   Albumin 3.7 3.5 - 5.0 g/dL   AST 22 15 - 41 U/L   ALT 19 0 - 44 U/L   Alkaline Phosphatase 49 38 - 126 U/L   Total Bilirubin 0.4 0.3 - 1.2 mg/dL   GFR, Estimated >46 >96 mL/min    Comment: (NOTE) Calculated using the CKD-EPI Creatinine Equation (2021)    Anion gap 9 5 - 15    Comment: Performed at [facility], [address] [place], [place] [address]  Lipase,  blood     Status: None   Collection Time: 06/05/23 12:30 AM  Result Value Ref Range   Lipase 45 11 - 51 U/L    Comment: Performed at [facility], [address] [place], [place] [address]  Troponin I (High Sensitivity)     Status: Abnormal   Collection Time: 06/05/23 12:30 AM  Result Value Ref Range   Troponin I (High Sensitivity) 54 (H) <18 ng/L    Comment: (NOTE) Elevated high sensitivity troponin I (hsTnI) values and significant  changes across serial measurements may suggest ACS but many other  chronic and acute conditions are known to elevate hsTnI results.  Refer to the "Links" section for chest pain algorithms and additional  guidance. Performed at [facility], [address], [place], [place] [address]   CBC with Differential     Status: Abnormal   Collection Time: 06/05/23 12:30 AM  Result Value Ref Range   WBC 8.1 4.0 - 10.5 K/uL   RBC 3.34 (L) 3.87 - 5.11 MIL/uL   Hemoglobin 9.1 (L) 12.0 - 15.0 g/dL   HCT 72.5 (L) 36.6 - 44.0 %   MCV 85.6 80.0 - 100.0 fL   MCH 27.2 26.0 - 34.0 pg   MCHC 31.8 30.0 - 36.0 g/dL   RDW 34.7 (H) 42.5 - 95.6 %   Platelets 260 150 - 400 K/uL   nRBC 0.0 0.0 - 0.2 %   Neutrophils Relative % 76 %   Neutro Abs 6.1 1.7 - 7.7 K/uL   Lymphocytes Relative 15 %   Lymphs Abs 1.2 0.7 - 4.0 K/uL   Monocytes Relative 7 %   Monocytes Absolute 0.6 0.1 - 1.0 K/uL   Eosinophils Relative 1 %   Eosinophils Absolute 0.1 0.0 - 0.5 K/uL   Basophils Relative 0 %   Basophils Absolute 0.0 0.0 - 0.1 K/uL   Immature Granulocytes 1 %   Abs Immature Granulocytes 0.08 (H) 0.00 - 0.07 K/uL    Comment: Performed at [facility] [place], [address] [place], [place] [address]  Troponin I (High Sensitivity)     Status: Abnormal   Collection Time: 06/05/23  2:25 AM  Result Value Ref Range   Troponin I (High Sensitivity) 39 (H) <18 ng/L    Comment: (NOTE) Elevated high sensitivity troponin I (hsTnI) values and  significant  changes across serial  measurements may suggest ACS but many other  chronic and acute conditions are known to elevate hsTnI results.  Refer to the "Links" section for chest pain algorithms and additional  guidance. Performed at [facility], [address], [place], [place] [address]   Urinalysis, Routine w reflex microscopic -Urine, Clean Catch     Status: Abnormal   Collection Time: 06/05/23  4:38 AM  Result Value Ref Range   Color, Urine STRAW (A) YELLOW   APPearance CLEAR CLEAR   Specific Gravity, Urine 1.030 1.005 - 1.030   pH 7.0 5.0 - 8.0   Glucose, UA NEGATIVE NEGATIVE mg/dL   Hgb urine dipstick NEGATIVE NEGATIVE   Bilirubin Urine NEGATIVE NEGATIVE   Ketones, ur NEGATIVE NEGATIVE mg/dL   Protein, ur NEGATIVE NEGATIVE mg/dL   Nitrite NEGATIVE NEGATIVE   Leukocytes,Ua MODERATE (A) NEGATIVE   RBC / HPF 0-5 0 - 5 RBC/hpf   WBC, UA 6-10 0 - 5 WBC/hpf   Bacteria, UA NONE SEEN NONE SEEN   Squamous Epithelial / HPF 0-5 0 - 5 /HPF   Hyaline Casts, UA PRESENT     Comment: Performed at [facility], [address] [place], [place] [address]  I-Stat Lactic Acid     Status: None   Collection Time: 06/05/23  4:59 AM  Result Value Ref Range   Lactic Acid, Venous 1.7 0.5 - 1.9 mmol/L  SARS Coronavirus 2 by RT PCR (hospital order, performed in [facility] hospital lab) *cepheid single result test* Anterior Nasal Swab     Status: None   Collection Time: 06/05/23  5:42 AM   Specimen: Anterior Nasal Swab  Result Value Ref Range   SARS Coronavirus 2 by RT PCR NEGATIVE NEGATIVE    Comment: (NOTE) SARS-CoV-2 target nucleic acids are NOT DETECTED.  The SARS-CoV-2 RNA is generally detectable in upper and lower respiratory specimens during the acute phase of infection. The lowest concentration of SARS-CoV-2 viral copies this assay can detect is 250 copies / mL. A negative result does not preclude SARS-CoV-2 infection and should not be used  as the sole basis for treatment or other patient management decisions.  A negative result may occur with improper specimen collection / handling, submission of specimen other than nasopharyngeal swab, presence of viral mutation(s) within the areas targeted by this assay, and inadequate number of viral copies (<250 copies / mL). A negative result must be combined with clinical observations, patient history, and epidemiological information.  Fact Sheet for Patients:   RoadLapTop.co.za  Fact Sheet for Healthcare Providers: http://kim-miller.com/  This test is not yet approved or  cleared by the [place] FDA and has been authorized for detection and/or diagnosis of SARS-CoV-2 by FDA under an Emergency Use Authorization (EUA).  This EUA will remain in effect (meaning this test can be used) for the duration of the COVID-19 declaration under Section 564(b)(1) of the Act, 21 U.S.C. section 360bbb-3(b)(1), unless the authorization is terminated or revoked sooner.  Performed at [facility], [address], [place], [place] [address]   D-dimer, quantitative     Status: Abnormal   Collection Time: 06/05/23  6:28 AM  Result Value Ref Range   D-Dimer, Quant 0.61 (H) 0.00 - 0.50 ug/mL-FEU    Comment: (NOTE) At the manufacturer cut-off value of 0.5 g/mL FEU, this assay has a negative predictive value of 95-100%.This assay is intended for use in conjunction with a clinical pretest probability (PTP) assessment model to exclude pulmonary embolism (  PE) and deep venous thrombosis (DVT) in outpatients suspected of PE or DVT. Results should be correlated with clinical presentation. Performed at [facility], [address], [place], [place] [address]   Lactic acid, plasma     Status: None   Collection Time: 06/05/23  6:28 AM  Result Value Ref Range   Lactic Acid, Venous 1.5 0.5 - 1.9 mmol/L    Comment:  Performed at [facility], [address], [place], [place] [address]  I-Stat Lactic Acid     Status: None   Collection Time: 06/05/23  6:36 AM  Result Value Ref Range   Lactic Acid, Venous 1.5 0.5 - 1.9 mmol/L   DG CHEST PORT 1 VIEW  Result Date: 06/05/2023 CLINICAL DATA:  62 year old female history of shortness of breath. Weakness. EXAM: PORTABLE CHEST 1 VIEW COMPARISON:  Chest x-ray 04/15/2023. FINDINGS: Small bore tubing projecting over the left hemithorax and left cervical region, presumably ventriculoperitoneal shunt tubing. Lung volumes are normal. No consolidative airspace disease. No pleural effusions. No pneumothorax. No pulmonary nodule or mass noted. Large hiatal hernia. Pulmonary vasculature and the cardiomediastinal silhouette are otherwise within normal limits. IMPRESSION: 1. No radiographic evidence of acute cardiopulmonary disease. 2. Large hiatal hernia. Electronically Signed   By: Trudie Reed M.D.   On: 06/05/2023 06:57   CT ABDOMEN PELVIS W CONTRAST  Result Date: 06/05/2023 CLINICAL DATA:  RLQ abdominal pain Spanish speaking, seen pcp about a week ago, some sob and weakness. Using restroom, had acute right side abdominal pain, passed out. 1 episode emsis, hx of fatty liver. EXAM: CT ABDOMEN AND PELVIS WITH CONTRAST TECHNIQUE: Multidetector CT imaging of the abdomen and pelvis was performed using the standard protocol following bolus administration of intravenous contrast. RADIATION DOSE REDUCTION: This exam was performed according to the departmental dose-optimization program which includes automated exposure control, adjustment of the mA and/or kV according to patient size and/or use of iterative reconstruction technique. CONTRAST:  OMNIPAQUE IOHEXOL 300 MG/ML  SOLN COMPARISON:  CT abdomen pelvis 12/01/2021 FINDINGS: Lower chest: Partially visualized large hiatal hernia containing the majority of the stomach. Hepatobiliary: The hepatic parenchyma is  diffusely hypodense compared to the splenic parenchyma consistent with fatty infiltration. No focal liver abnormality. No gallstones, gallbladder wall thickening, or pericholecystic fluid. No biliary dilatation. Pancreas: No focal lesion. Normal pancreatic contour. No surrounding inflammatory changes. No main pancreatic ductal dilatation. Spleen: Normal in size without focal abnormality. Splenules are noted. Adrenals/Urinary Tract: No adrenal nodule bilaterally. Bilateral kidneys enhance symmetrically. No hydronephrosis. No hydroureter. The urinary bladder is unremarkable. On delayed imaging, there is no urothelial wall thickening and there are no filling defects in the opacified portions of the bilateral collecting systems or ureters. Stomach/Bowel: Stomach is within normal limits. No evidence of bowel wall thickening or dilatation. No pneumatosis. Appendix appears normal. Vascular/Lymphatic: No abdominal aorta or iliac aneurysm. Mild atherosclerotic plaque of the aorta and its branches. No abdominal, pelvic, or inguinal lymphadenopathy. Reproductive: Status post hysterectomy. No adnexal masses. Other: Likely peritoneal dialysis catheter or VP shunt with tip terminating in the pelvis. No intraperitoneal free fluid. No intraperitoneal free gas. No organized fluid collection. Musculoskeletal: No abdominal wall hernia or abnormality. No suspicious lytic or blastic osseous lesions. No acute displaced fracture. A port S1 posterolateral and interbody surgical hardware fusion. IMPRESSION: 1. Partially visualized large hiatal hernia containing the majority of the stomach. No associated findings of bowel obstruction or ischemia. 2. Hepatic steatosis. 3. No acute intra-abdominal or intrapelvic abnormality. Electronically  Signed   By: Tish Frederickson M.D.   On: 06/05/2023 04:01    Pending Labs Unresulted Labs (From admission, onward)     Start     Ordered   06/12/23 0500  Creatinine, serum  (enoxaparin (LOVENOX)    CrCl  >/= 30 ml/min)  Weekly,   R     Comments: while on enoxaparin therapy    06/05/23 0608   06/06/23 0500  Basic metabolic panel  Tomorrow morning,   R        06/05/23 0608   06/06/23 0500  CBC  Tomorrow morning,   R        06/05/23 0608            Vitals/Pain Today's Vitals   06/05/23 0542 06/05/23 0600 06/05/23 0700 06/05/23 0800  BP: (!) 96/49 106/65 108/61 (!) 101/43  Pulse: 69 65 68 69  Resp: 15 15 18 16   Temp: 98.3 F (36.8 C)     TempSrc: Oral     SpO2: 98% 97% 97% 96%    Isolation Precautions Airborne and Contact precautions  Medications Medications  levothyroxine (SYNTHROID) tablet 75 mcg (75 mcg Oral Given by Other 06/05/23 0644)  dextrose 5 % and 0.9 % NaCl with KCl 20 mEq/L infusion ( Intravenous New Bag/Given 06/05/23 0740)  enoxaparin (LOVENOX) injection 40 mg (40 mg Subcutaneous Given 06/05/23 0848)  acetaminophen (TYLENOL) tablet 650 mg (has no administration in time range)    Or  acetaminophen (TYLENOL) suppository 650 mg (has no administration in time range)  ondansetron (ZOFRAN) tablet 4 mg (has no administration in time range)    Or  ondansetron (ZOFRAN) injection 4 mg (has no administration in time range)  pantoprazole (PROTONIX) EC tablet 40 mg (40 mg Oral Given 06/05/23 0848)  QUEtiapine (SEROQUEL) tablet 50 mg (has no administration in time range)  venlafaxine XR (EFFEXOR-XR) 24 hr capsule 75 mg (75 mg Oral Given 06/05/23 0848)  ferrous sulfate tablet 325 mg (325 mg Oral Given 06/05/23 0847)  sodium chloride 0.9 % bolus 500 mL (0 mLs Intravenous Stopped 06/05/23 0335)  iohexol (OMNIPAQUE) 300 MG/ML solution 100 mL (100 mLs Intravenous Contrast Given 06/05/23 0144)  sodium chloride 0.9 % bolus 1,000 mL (0 mLs Intravenous Stopped 06/05/23 0844)    Mobility walks with person assist   Patient is A&Ox4

## 2023-06-05 NOTE — Consult Note (Signed)
Cardiology Consult:   Patient ID: BARABARA STAYTON; MRN: [id number]; DOB: 1960-11-21   Admission date: 06/04/2023  Primary Care Provider: Latrelle Dodrill, MD Primary Cardiologist: Dr. Herbie Baltimore  Chief Complaint:  Situation Syncope  Patient Profile:   MEICHELLE READ is a 62 y.o. female  with a hx for HTN, prior DVT, BrCA in remission, s/p VPS shunt (for cerebral aneurysm), thyroid disease  seen for syncope on the commode.  History of Present Illness:   Ms. Caufield a 62 year old with a history of obstructive sleep apnea, hypertension, hyperlipidemia, pneumonia, a presented with an episode of unwitnessed syncope in the restroom. This was preceded by right-sided abdominal pain. The patient quickly recovered post-event. Prior to this, the patient had one episode of syncope or loss of consciousness that she describeds as ages ago.  In September 2024, the patient experienced sudden onset chest pain and dyspnea on exertion, which did improve with the initiation of a proton pump inhibitor. An echocardiogram at that time showed hyperdynamic function and left atrial dilation, but no left ventricular hypertrophy. Turbulent flow was noted in the left ventricular outflow tract, but with no significant gradient.  The patient has been experiencing significant abdominal pain and fatigue, despite the initiation of oral iron supplementation. The patient denies straining for stool, but a CT scan showed evidence of stool burden. Patient notes that she is doing well since seeing Dr. Herbie Baltimore.  She thought that after using the restroom her abdominal pain would decrease.  She doesn't remember the event.  She notes that her sister heard a crash and when to check her; she had hit the bathroom sink.     No chest pain or pressure .  No SOB/DOE and no PND/Orthopnea.  No weight gain or leg swelling.  No palpitations.  The patient has a history of iron deficiency anemia and has experienced a drop in  hemoglobin from 11.9 to 9.1, contributing to fatigue. The patient denies active blood loss. The patient also reported past episodes of syncope when very young, associated with feeling cold and clammy as she felt when her sister got to her after this episode.  Allergies:   Denies allergies.  Social History:   Social History   Socioeconomic History   Marital status: Legally Separated    Spouse name: Not on file   Number of children: 3   Years of education: 6   Highest education level: 6th grade  Occupational History   Occupation: Disability  Tobacco Use   Smoking status: Never   Smokeless tobacco: Never  Vaping Use   Vaping status: Never Used  Substance and Sexual Activity   Alcohol use: No   Drug use: No   Sexual activity: Not Currently    Partners: Male    Birth control/protection: Surgical    Comment: 1st intercourse 62 yo-Fewer than 5 partners, hysterectomy  Other Topics Concern   Not on file  Social History Narrative   Moved from [place] to [place], to [place] 1998   Lives at home with her nephew and her son.   Right-handed.   Occasional use of caffeine.   Social Determinants of Health   Financial Resource Strain: Medium Risk (05/10/2023)   Overall Financial Resource Strain (CARDIA)    Difficulty of Paying Living Expenses: Somewhat hard  Food Insecurity: No Food Insecurity (05/10/2023)   Hunger Vital Sign    Worried About Running Out of Food in the Last Year: Never true    Ran Out of Food  in the Last Year: Never true  Transportation Needs: No Transportation Needs (05/10/2023)   PRAPARE - Administrator, Civil Service (Medical): No    Lack of Transportation (Non-Medical): No  Physical Activity: Inactive (10/08/2022)   Exercise Vital Sign    Days of Exercise per Week: 0 days    Minutes of Exercise per Session: 0 min  Stress: Stress Concern Present (05/10/2023)   [employer] of Occupational Health - Occupational Stress Questionnaire    Feeling of  Stress : To some extent  Social Connections: Moderately Isolated (10/08/2022)   Social Connection and Isolation Panel [NHANES]    Frequency of Communication with Friends and Family: More than three times a week    Frequency of Social Gatherings with Friends and Family: Once a week    Attends Religious Services: 1 to 4 times per year    Active Member of [employer] or Organizations: No    Attends Banker Meetings: Never    Marital Status: Separated  Intimate Partner Violence: Not At Risk (10/08/2022)   Humiliation, Afraid, Rape, and Kick questionnaire    Fear of Current or Ex-Partner: No    Emotionally Abused: No    Physically Abused: No    Sexually Abused: No    Family History:   The patient's family history includes Dementia in her father; Diabetes in her mother; Heart disease in her father; Hyperlipidemia in her father; Hypertension in her mother; Pancreatic cancer (age of onset: 67) in her cousin; Schizophrenia in her brother; Thyroid disease in her sister.    ROS:  Please see the history of present illness.   Physical Exam/Data:   Vitals:   06/05/23 0700 06/05/23 0800 06/05/23 0933 06/05/23 1034  BP: 108/61 (!) 101/43  (!) 131/55  Pulse: 68 69  73  Resp: 18 16  18   Temp:   97.9 F (36.6 C) 98.2 F (36.8 C)  TempSrc:   Oral Oral  SpO2: 97% 96%  98%    Intake/Output Summary (Last 24 hours) at 06/05/2023 1048 Last data filed at 06/05/2023 0844 Gross per 24 hour  Intake 1500 ml  Output --  Net 1500 ml    Gen: no distress, obese   Neck: No JVD Cardiac: No Rubs or Gallops, systolic murmur, RRR +2radial pulses Respiratory: Clear to auscultation bilaterally, normal effort, normal  respiratory rate GI: Soft, nontender, slightly distended with dullness to percussion  MS: No  edema;  moves all extremities Integument: Skin feels warm Neuro:  At time of evaluation, alert and oriented to person/place/time/situation  Psych: Normal affect, patient feels warm  EKG:  The  ECG that was done  was personally reviewed and demonstrates  Sinus rhythm with T wave flattening (nonspecific inferior) and Qtc ~ 490  Relevant CV Studies:  Cardiac Studies & Procedures     STRESS TESTS  EXERCISE TOLERANCE TEST (ETT) 07/12/2017  Narrative  There was no ST segment deviation noted during stress.  No T wave inversion was noted during stress.  Blood pressure demonstrated a normal response to exercise.  The test was stopped because the patient complained of shortness of breath  Overall, the patient's exercise capacity was normal.  Duke Treadmill Score: low risk  Negative stress test without evidence of ischemia at given workload.   ECHOCARDIOGRAM  ECHOCARDIOGRAM COMPLETE 04/28/2023  Narrative ECHOCARDIOGRAM REPORT    Patient Name:   KENDAHL PEREZHERNANDEZ Date of Exam: 04/28/2023 Medical Rec #:  [id number]  Height:       63.0 in Accession #:    [id number]           Weight:       174.0 lb Date of Birth:  05/22/1961            BSA:          1.823 m Patient Age:    62 years             BP:           128/73 mmHg Patient Gender: F                    HR:           89 bpm. Exam Location:  Outpatient  Procedure: 2D Echo, 3D Echo, Cardiac Doppler, Color Doppler and Strain Analysis  Indications:    R01.1 Murmur  History:        Patient has no prior history of Echocardiogram examinations. Signs/Symptoms:Dyspnea, Shortness of Breath, Syncope and Murmur; Risk Factors:Hypertension. Pulmonary embolus.  Sonographer:    Sheralyn Boatman RDCS Referring Phys: Evette Georges   Sonographer Comments: Technically difficult study due to poor echo windows. IMPRESSIONS   1. Left ventricular ejection fraction, by estimation, is 70 to 75%. Left ventricular ejection fraction by 3D volume is 72 %. The left ventricle has hyperdynamic function. The left ventricle has no regional wall motion abnormalities. There is mild left ventricular hypertrophy. Left ventricular diastolic  parameters are consistent with Grade I diastolic dysfunction (impaired relaxation). The average left ventricular global longitudinal strain is -19.7 %. The global longitudinal strain is normal. 2. Right ventricular systolic function is normal. The right ventricular size is normal. 3. Left atrial size was moderately dilated. 4. The mitral valve is grossly normal. Mild mitral valve regurgitation. No evidence of mitral stenosis. 5. The aortic valve is normal in structure. Aortic valve regurgitation is not visualized. No aortic stenosis is present.  Comparison(s): No prior Echocardiogram.  FINDINGS Left Ventricle: Left ventricular ejection fraction, by estimation, is 70 to 75%. Left ventricular ejection fraction by 3D volume is 72 %. The left ventricle has hyperdynamic function. The left ventricle has no regional wall motion abnormalities. The average left ventricular global longitudinal strain is -19.7 %. The global longitudinal strain is normal. The left ventricular internal cavity size was small. There is mild left ventricular hypertrophy. Left ventricular diastolic parameters are consistent with Grade I diastolic dysfunction (impaired relaxation).  Right Ventricle: The right ventricular size is normal. No increase in right ventricular wall thickness. Right ventricular systolic function is normal.  Left Atrium: Left atrial size was moderately dilated.  Right Atrium: Right atrial size was normal in size.  Pericardium: There is no evidence of pericardial effusion.  Mitral Valve: The mitral valve is grossly normal. Mild mitral valve regurgitation. No evidence of mitral valve stenosis.  Tricuspid Valve: The tricuspid valve is normal in structure. Tricuspid valve regurgitation is not demonstrated. No evidence of tricuspid stenosis.  Aortic Valve: The aortic valve is normal in structure. Aortic valve regurgitation is not visualized. No aortic stenosis is present.  Pulmonic Valve: The pulmonic  valve was normal in structure. Pulmonic valve regurgitation is not visualized. No evidence of pulmonic stenosis.  Aorta: The aortic root and ascending aorta are structurally normal, with no evidence of dilitation.  IAS/Shunts: No atrial level shunt detected by color flow Doppler.   LEFT VENTRICLE PLAX 2D LVIDd:         4.10 cm  Diastology LVIDs:         2.10 cm         LV e' medial:    5.77 cm/s LV PW:         0.90 cm         LV E/e' medial:  15.5 LV IVS:        1.10 cm         LV e' lateral:   8.49 cm/s LVOT diam:     2.10 cm         LV E/e' lateral: 10.6 LV SV:         93 LV SV Index:   51              2D LVOT Area:     3.46 cm        Longitudinal Strain 2D Strain GLS  -19.7 % LV Volumes (MOD)               Avg: LV vol d, MOD    86.3 ml A2C:                           3D Volume EF LV vol d, MOD    58.8 ml       LV 3D EF:    Left A4C:                                        ventricul LV vol s, MOD    25.4 ml                    ar A2C:                                        ejection LV vol s, MOD    14.9 ml                    fraction A4C:                                        by 3D LV SV MOD A2C:   60.9 ml                    volume is LV SV MOD A4C:   58.8 ml                    72 %. LV SV MOD BP:    54.1 ml  3D Volume EF: 3D EF:        72 % LV EDV:       106 ml LV ESV:       30 ml LV SV:        76 ml  RIGHT VENTRICLE             IVC RV S prime:     13.30 cm/s  IVC diam: 1.00 cm TAPSE (M-mode): 2.0 cm  LEFT ATRIUM             Index        RIGHT ATRIUM           Index LA diam:  2.60 cm 1.43 cm/m   RA Area:     11.20 cm LA Vol (A2C):   16.7 ml 9.16 ml/m   RA Volume:   24.30 ml  13.33 ml/m LA Vol (A4C):   43.2 ml 23.70 ml/m LA Biplane Vol: 26.6 ml 14.60 ml/m AORTIC VALVE LVOT Vmax:   129.00 cm/s LVOT Vmean:  85.200 cm/s LVOT VTI:    0.268 m  AORTA Ao Root diam: 2.80 cm Ao Asc diam:  3.10 cm  MITRAL VALVE MV Area (PHT): 2.91 cm     SHUNTS MV  Decel Time: 261 msec     Systemic VTI:  0.27 m MV E velocity: 89.60 cm/s   Systemic Diam: 2.10 cm MV A velocity: 104.00 cm/s MV E/A ratio:  0.86  Kardie Tobb DO Electronically signed by Thomasene Ripple DO Signature Date/Time: 04/28/2023/4:17:08 PM    Final     CT SCANS  CT CORONARY MORPH W/CTA COR W/SCORE 05/24/2023  Narrative CLINICAL DATA:  Chest pain  EXAM: Cardiac/Coronary CTA  TECHNIQUE: A non-contrast, gated CT scan was obtained with axial slices of 3 mm through the heart for calcium scoring. Calcium scoring was performed using the Agatston method. A 120 kV prospective, gated, contrast cardiac scan was obtained. Gantry rotation speed was 250 msecs and collimation was 0.6 mm. Two sublingual nitroglycerin tablets (0.8 mg) were given. The 3D data set was reconstructed in 5% intervals of the 35-75% of the R-R cycle. Diastolic phases were analyzed on a dedicated workstation using MPR, MIP, and VRT modes. The patient received 95 cc of contrast.  FINDINGS: Image quality: Excellent.  Noise artifact is: Limited.  Coronary Arteries:  Normal coronary origin.  Right dominance.  Left main: The left main is a large caliber vessel with a normal take off from the left coronary cusp that bifurcates to form a left anterior descending artery and a left circumflex artery. There is no plaque or stenosis.  Left anterior descending artery: The LAD is patent without evidence of plaque or stenosis. The LAD gives off 3 patent diagonal branches.  Left circumflex artery: The LCX is non-dominant and patent with no evidence of plaque or stenosis. The LCX gives off a patent OM#1 branch and a large branching OM#2. There is no plaque or stenosis.  Right coronary artery: The RCA is dominant with normal take off from the right coronary cusp. There is mild soft plaque in the proximal RCA with associated stenosis of 25-49%. The RCA terminates as a PDA and right posterolateral branch without  evidence of plaque or stenosis.  Right Atrium: Right atrial size is within normal limits.  Right Ventricle: The right ventricular cavity is within normal limits.  Left Atrium: Left atrial size is normal in size with no left atrial appendage filling defect.  Left Ventricle: The ventricular cavity size is within normal limits.  Pulmonary arteries: Normal in size.  Pulmonary veins: Normal pulmonary venous drainage.  Pericardium: Normal thickness without significant effusion or calcium present.  Cardiac valves: The aortic valve is trileaflet without significant calcification. The mitral valve is normal without significant calcification.  Aorta: Normal caliber without significant disease.  Extra-cardiac findings: See attached radiology report for non-cardiac structures.  IMPRESSION: 1. Coronary calcium score of 0. This was 0 percentile for age-, sex, and race-matched controls.  2. Total plaque volume 130 mm3 which is 56th percentile for age- and sex-matched controls (calcified plaque 31mm3; non-calcified plaque 140mm3). TPV is mild.  3. Normal coronary origin with right dominance.  4. Mild  atherosclerosis.  25-49% soft plaque in the proximal RCA.  5. Recommend preventive therapy and risk factor modification.  6. Consider non atherosclerotic causes of chest pain.  RECOMMENDATIONS: 1. CAD-RADS 0: No evidence of CAD (0%). Consider non-atherosclerotic causes of chest pain.  2. CAD-RADS 1: Minimal non-obstructive CAD (0-24%). Consider non-atherosclerotic causes of chest pain. Consider preventive therapy and risk factor modification.  3. CAD-RADS 2: Mild non-obstructive CAD (25-49%). Consider non-atherosclerotic causes of chest pain. Consider preventive therapy and risk factor modification.  4. CAD-RADS 3: Moderate stenosis. Consider symptom-guided anti-ischemic pharmacotherapy as well as risk factor modification per guideline directed care. Additional analysis with CT  FFR will be submitted.  5. CAD-RADS 4: Severe stenosis. (70-99% or > 50% left main). Cardiac catheterization or CT FFR is recommended. Consider symptom-guided anti-ischemic pharmacotherapy as well as risk factor modification per guideline directed care. Invasive coronary angiography recommended with revascularization per published guideline statements.  6. CAD-RADS 5: Total coronary occlusion (100%). Consider cardiac catheterization or viability assessment. Consider symptom-guided anti-ischemic pharmacotherapy as well as risk factor modification per guideline directed care.  7. CAD-RADS N: Non-diagnostic study. Obstructive CAD can't be excluded. Alternative evaluation is recommended.  Armanda Magic, MD   Electronically Signed By: Armanda Magic M.D. On: 05/26/2023 12:43          Laboratory Data:  Chemistry Recent Labs  Lab 06/05/23 0030  NA 138  K 3.5  CL 103  CO2 26  GLUCOSE 110*  BUN 25*  CREATININE 0.90  CALCIUM 9.3  GFRNONAA >60  ANIONGAP 9    Recent Labs  Lab 06/05/23 0030  PROT 7.0  ALBUMIN 3.7  AST 22  ALT 19  ALKPHOS 49  BILITOT 0.4   Hematology Recent Labs  Lab 06/05/23 0030  WBC 8.1  RBC 3.34*  HGB 9.1*  HCT 28.6*  MCV 85.6  MCH 27.2  MCHC 31.8  RDW 18.7*  PLT 260   Cardiac EnzymesNo results for input(s): "TROPONINI" in the last 168 hours. No results for input(s): "TROPIPOC" in the last 168 hours.  BNPNo results for input(s): "BNP", "PROBNP" in the last 168 hours.  DDimer  Recent Labs  Lab 06/05/23 0628  DDIMER 0.61*     Assessment and Plan:   Vasovagal Syncope Unwitnessed syncope in the restroom, likely related to using the restroom (situation syncope). No evidence of cardiac cause for syncope. -Continue IV fluid resuscitation. -Encourage PO hydration given national IV fluid shortage -Improve bowel regimen.  Iron Deficiency Anemia Hemoglobin dropped from 11.9 to 9.1, evidence of iron deficiency anemia and symptomatic with  fatigue. -Administer IV iron supplementation considering increased stool burden on PO iron. -Defer iron deficiency workup to internal medicine colleagues.  Hypertension Currently on hold for Lisinopril, Metoprolol, and Diuretics due to low blood pressure. -Gradually reintroduce blood pressure medication regimen, likely without Dyazide. -Control blood pressure with Metoprolol or combination of Metoprolol and Lisinopril.  Obstructive Sleep Apnea Testing ordered. -Continue with planned sleep study as outpatient  Mild non obstructive CAD - slight elevation in troponin lightly related to this, no aggressive therapy recommended  Hypothyroidism -Continue Synthroid as prescribed by primary  Gastroesophageal Reflux Disease -Continue Protonix as per primary   For questions or updates, please contact [facility] Please consult [email or web address] for contact info under Cardiology/STEMI.   Riley Lam, MD FASE [facility] Cardiologist [facility]  [address] [place], [address] [place], [place] [address] [phone]  10:48 AM

## 2023-06-06 ENCOUNTER — Observation Stay (HOSPITAL_BASED_OUTPATIENT_CLINIC_OR_DEPARTMENT_OTHER): Payer: Medicare HMO

## 2023-06-06 DIAGNOSIS — R609 Edema, unspecified: Secondary | ICD-10-CM | POA: Diagnosis not present

## 2023-06-06 DIAGNOSIS — R55 Syncope and collapse: Secondary | ICD-10-CM | POA: Diagnosis not present

## 2023-06-06 DIAGNOSIS — D509 Iron deficiency anemia, unspecified: Secondary | ICD-10-CM | POA: Diagnosis not present

## 2023-06-06 LAB — BASIC METABOLIC PANEL
Anion gap: 8 (ref 5–15)
BUN: 14 mg/dL (ref 8–23)
CO2: 21 mmol/L — ABNORMAL LOW (ref 22–32)
Calcium: 8 mg/dL — ABNORMAL LOW (ref 8.9–10.3)
Chloride: 111 mmol/L (ref 98–111)
Creatinine, Ser: 0.85 mg/dL (ref 0.44–1.00)
GFR, Estimated: >60 mL/min (ref >60)
Glucose, Bld: 106 mg/dL — ABNORMAL HIGH (ref 70–99)
Potassium: 3.6 mmol/L (ref 3.5–5.1)
Sodium: 140 mmol/L (ref 135–145)

## 2023-06-06 LAB — CBC
HCT: 25.8 % — ABNORMAL LOW (ref 36.0–46.0)
Hemoglobin: 7.7 g/dL — ABNORMAL LOW (ref 12.0–15.0)
MCH: 26.8 pg (ref 26.0–34.0)
MCHC: 29.8 g/dL — ABNORMAL LOW (ref 30.0–36.0)
MCV: 89.9 fL (ref 80.0–100.0)
Platelets: 209 10*3/uL (ref 150–400)
RBC: 2.87 MIL/uL — ABNORMAL LOW (ref 3.87–5.11)
RDW: 20.2 % — ABNORMAL HIGH (ref 11.5–15.5)
WBC: 4.7 10*3/uL (ref 4.0–10.5)
nRBC: 0 % (ref 0.0–0.2)

## 2023-06-06 LAB — IRON AND TIBC
Iron: 96 ug/dL (ref 28–170)
Saturation Ratios: 22 % (ref 10.4–31.8)
TIBC: 441 ug/dL (ref 250–450)
UIBC: 345 ug/dL

## 2023-06-06 LAB — FOLATE: Folate: 9.6 ng/mL (ref >5.9)

## 2023-06-06 LAB — FERRITIN: Ferritin: 38 ng/mL (ref 11–307)

## 2023-06-06 LAB — HEMOGLOBIN AND HEMATOCRIT, BLOOD
HCT: 26.8 % — ABNORMAL LOW (ref 36.0–46.0)
Hemoglobin: 8.2 g/dL — ABNORMAL LOW (ref 12.0–15.0)

## 2023-06-06 LAB — RETICULOCYTES
Immature Retic Fract: 30.5 % — ABNORMAL HIGH (ref 2.3–15.9)
RBC.: 2.83 MIL/uL — ABNORMAL LOW (ref 3.87–5.11)
Retic Count, Absolute: 144 10*3/uL (ref 19.0–186.0)
Retic Ct Pct: 5.1 % — ABNORMAL HIGH (ref 0.4–3.1)

## 2023-06-06 LAB — VITAMIN B12: Vitamin B-12: 6278 pg/mL — ABNORMAL HIGH (ref 180–914)

## 2023-06-06 NOTE — Care Management Obs Status (Signed)
MEDICARE OBSERVATION STATUS NOTIFICATION   Patient Details  Name: Sherry Anderson MRN: [id number] Date of Birth: 18-Jul-1961   Medicare Observation Status Notification Given:  Yes    Beckie Busing, RN 06/06/2023, 3:13 PM

## 2023-06-06 NOTE — Progress Notes (Signed)
Evern Bio to be D/C'd Home per MD order.  Discussed with the patient and all questions fully answered.  An After Visit Summary was printed and given to the patient.   D/c education completed with patient/family including follow up instructions, medication list, d/c activities limitations if indicated, with other d/c instructions as indicated by MD - patient able to verbalize understanding, all questions fully answered.   Patient instructed to return to ED, call 911, or call MD for any changes in condition.   Patient escorted via WC, and D/C home via private auto.  Pauletta Browns 06/06/2023 4:06 PM

## 2023-06-06 NOTE — Progress Notes (Addendum)
Patient Name: Sherry Anderson Date of Encounter: 06/06/2023 [facility] Cardiologist: Bryan Lemma, MD   Interval Summary  .    Patient does not have any complaints however her hemoglobin continues to currently 7.7 from 9.1 on admission.  Vital Signs .    Vitals:   06/05/23 1402 06/05/23 1741 06/05/23 2234 06/06/23 0542  BP: (!) 111/58 (!) 108/59 (!) 113/55 (!) 101/51  Pulse: 76 83 79 71  Resp: 18 18 14 14   Temp: 98.1 F (36.7 C) 98.1 F (36.7 C) 99 F (37.2 C) 98.5 F (36.9 C)  TempSrc: Oral Oral Oral Oral  SpO2: 98% 95% 98% 98%    Intake/Output Summary (Last 24 hours) at 06/06/2023 0954 Last data filed at 06/06/2023 0612 Gross per 24 hour  Intake 1017.06 ml  Output --  Net 1017.06 ml      05/12/2023   10:24 AM 05/12/2023    8:00 AM 04/19/2023    1:36 PM  Last 3 Weights  Weight (lbs) 176 lb 172 lb 12.8 oz 174 lb  Weight (kg) 79.833 kg 78.382 kg 78.926 kg      Telemetry/ECG    Normal sinus rhythm, a few rhythm strips do show significant artifact and tachycardia unsure of underlying rhythm- Personally Reviewed  CV Studies    Echocardiogram 04/28/2023 1. Left ventricular ejection fraction, by estimation, is 70 to 75%. Left  ventricular ejection fraction by 3D volume is 72 %. The left ventricle has  hyperdynamic function. The left ventricle has no regional wall motion  abnormalities. There is mild left  ventricular hypertrophy. Left ventricular diastolic parameters are  consistent with Grade I diastolic dysfunction (impaired relaxation). The  average left ventricular global longitudinal strain is -19.7 %. The global  longitudinal strain is normal.   2. Right ventricular systolic function is normal. The right ventricular  size is normal.   3. Left atrial size was moderately dilated.   4. The mitral valve is grossly normal. Mild mitral valve regurgitation.  No evidence of mitral stenosis.   5. The aortic valve is normal in structure. Aortic  valve regurgitation is  not visualized. No aortic stenosis is present.   Comparison(s): No prior Echocardiogram.   Physical Exam .   GEN: No acute distress.   Neck: No JVD Cardiac: RRR, no murmurs, rubs, or gallops.  Respiratory: Clear to auscultation bilaterally. GI: Soft, nontender, non-distended  MS: No edema  Assessment & Plan .     Vasovagal syncope Patient reports being in the bathroom on the toilet having severe abdominal pain and believes that she passed out. No suspicion for seizure-like causes and less likely to be cardiogenic given inciting and possible situation syncope.  No arrhythmias or valvular disease have been noted.  Can consider heart monitor for thoroughness we will defer to MD whether this is warranted.  Iron deficiency anemia Hemoglobin has dropped from 9.1-7.7 without any obvious signs of acute GI bleed.  She denied any melena or hematemesis or hematuria.  She has been administered with IV iron supplementation.  Defer further treatment to primary team.  Likely will need to transfuse if hemoglobin drops below 7.  Hypertension Home antihypertensives have been held.  Blood pressures have been low to soft.  Would continue to hold  Mild nonobstructive CAD Had ER visit in 03/2023 due to chest pain and had coronary CT that showed 0 CAC score.  Has very rare events of chest pain now.  OSA Sleep study was ordered by previous provider.  Planning sleep study as an outpatient.    For questions or updates, please contact [facility] Please consult [email or web address] for contact info under        Signed, Abagail Kitchens, PA-C    I have seen and examined the patient along with Abagail Kitchens, PA-C  .  I have reviewed the chart, notes and new data.  I agree with PA/NP's note.  Key new complaints: no CV complaints Key examination changes: normal CV exam Key new findings / data: NSR on telemetry  PLAN: Presentation strongly consistent with neurally mediated  syncope. No significant structural heart disease. Precipitant was abdominal pain, which may share a cause with her iron deficiency anemia. No further workup planned from cardiology point of view. No change in previous plans for Cardiology F/U. Reintroduce BP meds gradually as appropriate. Please re-consult if needed.  Thurmon Fair, MD, [facility] [phone] 06/06/2023, 12:25 PM

## 2023-06-06 NOTE — Progress Notes (Signed)
Lower extremity venous duplex completed. Please see CV Procedures for preliminary results.  Shona Simpson, RVT 06/06/23 9:16 AM

## 2023-06-06 NOTE — Plan of Care (Signed)

## 2023-06-07 ENCOUNTER — Telehealth: Payer: Self-pay

## 2023-06-07 NOTE — Transitions of Care (Post Inpatient/ED Visit) (Signed)
   06/07/2023  Name: Sherry Anderson MRN: [id number] DOB: [birth year]  Today's TOC FU Call Status: Today's TOC FU Call Status:: Unsuccessful Call (1st Attempt) Unsuccessful Call (1st Attempt) Date: 06/07/23  Attempted to reach the patient regarding the most recent Inpatient/ED visit.  Follow Up Plan: Additional outreach attempts will be made to reach the patient to complete the Transitions of Care (Post Inpatient/ED visit) call.   Signature Karena Addison, LPN [facility] Nurse Health Advisor Direct Dial [phone]

## 2023-06-08 NOTE — Progress Notes (Signed)
  Care Coordination Note  06/08/2023 Name: [name] MRN: [id number] DOB: [birth year]  Sherry Anderson is a 62 y.o. year old female who is a primary care patient of [name] [name], MD and is actively engaged with the care management team. I reached out to Evern Bio by phone today to assist with re-scheduling a follow up visit with the Licensed Clinical Social Worker  Follow up plan: Telephone appointment with care management team member scheduled for:06/16/23  [facility] Coordination Care Guide  Direct Dial: [phone]

## 2023-06-09 ENCOUNTER — Encounter: Payer: Self-pay | Admitting: Family Medicine

## 2023-06-09 ENCOUNTER — Ambulatory Visit (INDEPENDENT_AMBULATORY_CARE_PROVIDER_SITE_OTHER): Payer: Medicare HMO | Admitting: Family Medicine

## 2023-06-09 VITALS — BP 98/58 | HR 82 | Wt 176.0 lb

## 2023-06-09 DIAGNOSIS — E611 Iron deficiency: Secondary | ICD-10-CM

## 2023-06-09 DIAGNOSIS — R55 Syncope and collapse: Secondary | ICD-10-CM | POA: Diagnosis not present

## 2023-06-09 DIAGNOSIS — F39 Unspecified mood [affective] disorder: Secondary | ICD-10-CM | POA: Diagnosis not present

## 2023-06-09 NOTE — Transitions of Care (Post Inpatient/ED Visit) (Signed)
   06/09/2023  Name: Sherry Anderson MRN: [id number] DOB: 10/11/1960  Today's TOC FU Call Status: Today's TOC FU Call Status:: Unsuccessful Call (2nd Attempt) Unsuccessful Call (1st Attempt) Date: 06/07/23 Unsuccessful Call (2nd Attempt) Date: 06/09/23  Attempted to reach the patient regarding the most recent Inpatient/ED visit.  Follow Up Plan: Additional outreach attempts will be made to reach the patient to complete the Transitions of Care (Post Inpatient/ED visit) call.   Signature Karena Addison, LPN [facility] Nurse Health Advisor Direct Dial [phone]

## 2023-06-09 NOTE — Assessment & Plan Note (Signed)
Improved with addition of seroquel. Continue this and her venlafaxine.

## 2023-06-09 NOTE — Progress Notes (Signed)
SUBJECTIVE:   CHIEF COMPLAINT / HPI:   Sherry Anderson is a 62 y.o. female who presents today for hospitalization follow up after syncopal episode.  Hx of event/hospitalization: Pt had gotten up to urinate around 10pm, while peeing felt very bad abdominal pain, felt like she was going to pass out so leaned over onto sink; was found by sister, who reports patient did not respond to her. Pt reports sister said patient was out for minutes; sister saw pt making face, and pt believes she might have bit her tongue. Pt reports her thinking was foggy when she regained consciousness. Pt was hospitalized for this on 10/19-10/21. Negative cardiac workup; abdominal CT with stable hiatal hernia, hepatic steatosis. Episode thought to be vasovagal and Lopressor was stopped at discharge. She was found to have Hgb of 7.7 during hospitalization which increased to 8.2 prior to discharge; she was given IV iron, unable to perform hemoccult test.  At office visit today: Pt reports feeling tired and sleepy with no energy since leaving hospital; this had been going on before pt arrived at hospital and has been stable. No presyncopal episodes. No abdominal pain; reports dark stools in setting of iron use, otherwise they have not been dark. Reports feeling unbalanced and unsteady when first rising from a seated position.  Colonoscopy 2 years ago which pt reported was normal. No hx seizure. One prior syncopal episode 2 years ago after using bathroom.  Reports mood is better and calmer since adding seroquel at last visit.  PERTINENT  PMH / PSH: HTN, hiatal hernia, GERD, hypothyroidism, mood disorder, prediabetes  OBJECTIVE:   BP (!) 98/58   Pulse 82   Wt 176 lb (79.8 kg)   LMP  (LMP Unknown) Comment: not sexually active  SpO2 96%   BMI 31.18 kg/m    Orthostatic VS for the past 24 hrs:  BP- Lying Pulse- Lying BP- Sitting Pulse- Sitting BP- Standing at 0 minutes Pulse- Standing at 0 minutes  06/09/23 1049  135/70 70 110/73 72 112/66 85   General: Pt is seated in chair, no acute distress. Cardiovascular: RRR, no murmurs, rubs, gallops. Pulmonary: Normal work of breathing. Lungs clear to auscultation bilaterally. Neuro/Psych: Alert and oriented to person, place, event, time. Normal affect.  ASSESSMENT/PLAN:   Assessment & Plan Iron deficiency Pt with Hgb down to 7.7 in recent hospitalization, improved to 8.2 on day of discharge but much lower than previously (11.9 on 04/19/23). Patient denies any bleeding. Warrants further evaluation by GI, would suggest sooner colonoscopy. - Will repeat CBC and BMET in clinic today - called and got patient a GI appointment on 10/31, which she will attend Syncope and collapse Pt with recent episode of syncope on 10/19 after using bathroom and experiencing abdominal pain. Was hospitalized with negative cardiac workup, stable CT abdomen workup; was found to be anemic. Lopressor stopped at discharge. Positive orthostatics on exam today. Episode likely vasovagal in nature. - Will stop pt's lisinopril - Pt cautioned on slower get up and go - Will bring pt back in a few weeks to recheck BP Mood disorder (HCC) Improved with addition of seroquel. Continue this and her venlafaxine.   Governor Rooks, Medical Student [place] [facility]  Patient seen along with medical student [employer]. I personally evaluated this patient along with the student, and verified all aspects of the history, physical exam, and medical decision making as documented by the student. I agree with the student's documentation and have made all  necessary edits.  Latrelle Dodrill, MD, IBCLC [facility]

## 2023-06-09 NOTE — Assessment & Plan Note (Signed)
Pt with Hgb down to 7.7 in recent hospitalization, improved to 8.2 on day of discharge but much lower than previously (11.9 on 04/19/23). Patient denies any bleeding. Warrants further evaluation by GI, would suggest sooner colonoscopy. - Will repeat CBC and BMET in clinic today - called and got patient a GI appointment on 10/31, which she will attend

## 2023-06-09 NOTE — Assessment & Plan Note (Signed)
Pt with recent episode of syncope on 10/19 after using bathroom and experiencing abdominal pain. Was hospitalized with negative cardiac workup, stable CT abdomen workup; was found to be anemic. Lopressor stopped at discharge. Positive orthostatics on exam today. Episode likely vasovagal in nature. - Will stop pt's lisinopril - Pt cautioned on slower get up and go - Will bring pt back in a few weeks to recheck BP

## 2023-06-09 NOTE — Patient Instructions (Addendum)
It was great to see you again today.  Got appointment scheduled with GI for 10/31.  Rechecking labs today  Stop lisinopril  Follow up with me as scheduled, sooner if needed  Be well, Dr. [name]

## 2023-06-10 ENCOUNTER — Telehealth: Payer: Self-pay

## 2023-06-10 LAB — CBC
Hematocrit: 32 % — ABNORMAL LOW (ref 34.0–46.6)
Hemoglobin: 9.8 g/dL — ABNORMAL LOW (ref 11.1–15.9)
MCH: 26.7 pg (ref 26.6–33.0)
MCHC: 30.6 g/dL — ABNORMAL LOW (ref 31.5–35.7)
MCV: 87 fL (ref 79–97)
Platelets: 246 10*3/uL (ref 150–450)
RBC: 3.67 x10E6/uL — ABNORMAL LOW (ref 3.77–5.28)
RDW: 19.5 % — ABNORMAL HIGH (ref 11.7–15.4)
WBC: 4 10*3/uL (ref 3.4–10.8)

## 2023-06-10 LAB — BASIC METABOLIC PANEL
BUN/Creatinine Ratio: 16 (ref 12–28)
BUN: 14 mg/dL (ref 8–27)
CO2: 22 mmol/L (ref 20–29)
Calcium: 9.1 mg/dL (ref 8.7–10.3)
Chloride: 102 mmol/L (ref 96–106)
Creatinine, Ser: 0.9 mg/dL (ref 0.57–1.00)
Glucose: 86 mg/dL (ref 70–99)
Potassium: 4.1 mmol/L (ref 3.5–5.2)
Sodium: 138 mmol/L (ref 134–144)
eGFR: 72 mL/min/{1.73_m2} (ref >59)

## 2023-06-10 NOTE — Patient Outreach (Signed)
  Care Coordination   06/10/2023 Name: Sherry Anderson MRN: [id number] DOB: 07-Jan-1961   Care Coordination Outreach Attempts:  An unsuccessful telephone outreach was attempted today to offer the patient information about available care coordination services.  Follow Up Plan:  Additional outreach attempts will be made to offer the patient care coordination information and services.   Encounter Outcome:  No Answer   Care Coordination Interventions:  No, not indicated    Juanell Fairly RN, BSN, [facility] [employer] [employer]er/designer Phone: [phone]

## 2023-06-15 NOTE — Transitions of Care (Post Inpatient/ED Visit) (Signed)
   06/15/2023  Name: Sherry Anderson MRN: [id number] DOB: May 25, 1961  Today's TOC FU Call Status: Today's TOC FU Call Status:: Unsuccessful Call (2nd Attempt) Unsuccessful Call (1st Attempt) Date: 06/07/23 Unsuccessful Call (2nd Attempt) Date: 06/09/23  Attempted to reach the patient regarding the most recent Inpatient/ED visit.  Follow Up Plan: No further outreach attempts will be made at this time. We have been unable to contact the patient. Patient already seen in office Signature Karena Addison, LPN [facility] Nurse Health Advisor Direct Dial [phone]

## 2023-06-15 NOTE — Progress Notes (Unsigned)
06/16/2023 Sherry Anderson [id number] 1961/06/13  Referring provider: Latrelle Dodrill, MD Primary GI doctor: Dr. Lavon Paganini  ASSESSMENT AND PLAN:   Iron Deficiency Anemia Recent onset of fatigue, shortness of breath, and dark stools. Iron supplementation initiated with some improvement in symptoms. History of hiatal hernia and gastric inflammation, concern for cameron ulcers, gastritis, etc.. Unremarkable colon 2022. -Schedule endoscopy to evaluate for potential sources of bleeding at [facility], Dr. Elana Alm first available is Dec, will schedule with Dr. Leone Payor sooner -Continue oral iron supplementation once daily. -Check CBC prior to endoscopy to ensure hemoglobin levels are improving. Will schedule EGD. I discussed risks of EGD with patient today, including risk of sedation, bleeding or perforation.  Patient provides understanding and gave verbal consent to proceed. If this is negative consider capsule endo/colon.  Hiatal Hernia with GERD History of a 5cm hiatal hernia diagnosed in 2018. Recent episode of severe abdominal pain radiating to chest and back, followed by syncope. -Continue Pantoprazole once daily, taken before lunch. -Consider increasing to twice daily if symptoms persist.  -Evaluate hiatal hernia during scheduled endoscopy.  Hypertension Recent hospitalization with low blood pressure readings, leading to discontinuation of Lisinopril. -Advise patient to discuss with primary care provider regarding the need to resume Lisinopril. -Encourage patient to monitor blood pressure at home and report any abnormal readings to primary care provider.  Hepatic Steatosis Identified on recent CT scan. Liver function tests are normal. -Advise patient to monitor liver function every six months. -Provide patient with information on hepatic steatosis and the importance of weight management.   Patient Care Team: Latrelle Dodrill, MD as PCP - General (Family  Medicine) Marykay Lex, MD as PCP - Cardiology (Cardiology) Drema Dallas, DO as Consulting Physician (Neurology) Zettie Pho, [place] (Inactive) as Pharmacist (Pharmacist) Genia Del, MD as Consulting Physician (Obstetrics and Gynecology) Juanell Fairly, RN as [facility] Care Management Elijah Birk, Weyman Croon, LCSW as Social Worker  HISTORY OF PRESENT ILLNESS: 62 y.o. female with a past medical history of hypertension, GERD with large hiatal hernia, hepatic steatosis, history of PE/DVT, breast cancer 2002 and others listed below presents for evaluation of IDA.   2018 endoscopy Dr. Lavon Paganini for epigastric discomfort showed 5 cm hiatal hernia congestion erythema entire stomach normal duodenum.  Pathology negative for dysplasia, metaplasia malignancy or H. pylori. 01/14/2021 colonoscopy Dr. Lavon Paganini due to personal history of colon polyps quality of the prep was good nonbleeding external/internal hemorrhoids otherwise unremarkable recall 5 years  Patient was admitted to the hospital 10/19 through 10/21 for syncopal episode.  05/12/2023 echo EF 70/75%, CT coronary minimal plaque burden. CT abdomen pelvis with contrast showed sliding hiatal hernia, hepatic steatosis otherwise unremarkable.   Patient was given IV iron.   FOBT was not performed as no stool in the vault. 06/09/2023 Hgb 9.8 MCV 87 normal platelets and no leukocytosis. 06/06/2023 ferritin 38 (compared to a month ago ferritin at 8), iron 96, saturation ratio 22, folate 9.6, B12 6278 patient had appropriate reticular response  Discussed the use of AI scribe software for clinical note transcription with the patient, who gave verbal consent to proceed.  The patient, with a history of iron deficiency anemia and a large hiatal hernia, presents with symptoms of fatigue, shortness of breath, and black stools. These symptoms have been ongoing for approximately two to three months. The patient has been taking iron supplements  as prescribed, which she believes may be causing the black stools.  The patient experienced a significant  episode of abdominal pain, which led to a loss of consciousness while at home. This event resulted in hospitalization. The patient also reported a previous episode of severe abdominal pain that radiated to the chest and back, which led to another hospital admission.  The patient has been taking pantoprazole for reflux symptoms, which she reports has been more effective than previous treatment with omeprazole. She also has a history of prediabetes and was previously on lisinopril for hypertension, but this was stopped due to episodes of low blood pressure. She has been monitoring her BP at home.  The patient has not experienced any weight loss and reports feeling better since receiving IV iron in the hospital. She continues to take oral iron once daily. The patient denies any alcohol use and only takes Tylenol for headaches.   She  reports that she has never smoked. She has never been exposed to tobacco smoke. She has never used smokeless tobacco. She reports that she does not drink alcohol and does not use drugs.  RELEVANT LABS AND IMAGING:  Results   LABS Hb: 9.8  RADIOLOGY CT coronary: Minimal plaque (05/12/2023) CT scan: Hepatic steatosis  DIAGNOSTIC Colonoscopy: Normal (2022) Endoscopy: Gastric inflammation, 5 cm hiatal hernia (2018) Echocardiogram: Normal ejection fraction (05/12/2023)      CBC    Component Value Date/Time   WBC 4.0 06/09/2023 1540   WBC 4.7 06/06/2023 0602   RBC 3.67 (L) 06/09/2023 1540   RBC 2.87 (L) 06/06/2023 0602   RBC 2.83 (L) 06/06/2023 0602   HGB 9.8 (L) 06/09/2023 1540   HGB 12.8 08/31/2007 1506   HCT 32.0 (L) 06/09/2023 1540   HCT 36.3 08/31/2007 1506   PLT 246 06/09/2023 1540   MCV 87 06/09/2023 1540   MCV 87.8 08/31/2007 1506   MCH 26.7 06/09/2023 1540   MCH 26.8 06/06/2023 0602   MCHC 30.6 (L) 06/09/2023 1540   MCHC 29.8 (L) 06/06/2023  0602   RDW 19.5 (H) 06/09/2023 1540   RDW 13.3 08/31/2007 1506   LYMPHSABS 1.2 06/05/2023 0030   LYMPHSABS 2.2 04/19/2023 1747   LYMPHSABS 1.9 08/31/2007 1506   MONOABS 0.6 06/05/2023 0030   MONOABS 0.3 08/31/2007 1506   EOSABS 0.1 06/05/2023 0030   EOSABS 0.2 04/19/2023 1747   BASOSABS 0.0 06/05/2023 0030   BASOSABS 0.1 04/19/2023 1747   BASOSABS 0.0 08/31/2007 1506   Recent Labs    04/15/23 0844 04/19/23 1747 06/05/23 0030 06/06/23 0602 06/06/23 1334 06/09/23 1540  HGB 11.0* 11.9 9.1* 7.7* 8.2* 9.8*    CMP     Component Value Date/Time   NA 138 06/09/2023 1540   K 4.1 06/09/2023 1540   CL 102 06/09/2023 1540   CO2 22 06/09/2023 1540   GLUCOSE 86 06/09/2023 1540   GLUCOSE 106 (H) 06/06/2023 0602   BUN 14 06/09/2023 1540   CREATININE 0.90 06/09/2023 1540   CREATININE 1.07 (H) 06/30/2020 1547   CALCIUM 9.1 06/09/2023 1540   PROT 7.0 06/05/2023 0030   PROT 7.8 05/12/2023 1648   ALBUMIN 3.7 06/05/2023 0030   ALBUMIN 4.6 05/12/2023 1648   AST 22 06/05/2023 0030   ALT 19 06/05/2023 0030   ALKPHOS 49 06/05/2023 0030   BILITOT 0.4 06/05/2023 0030   BILITOT 0.2 05/12/2023 1648   GFRNONAA >60 06/06/2023 0602   GFRNONAA 76 07/27/2016 1001   GFRAA 76 05/26/2020 0908   GFRAA 87 07/27/2016 1001      Latest Ref Rng & Units 06/05/2023   12:30 AM 05/12/2023  4:48 PM 04/19/2023    5:47 PM  Hepatic Function  Total Protein 6.5 - 8.1 g/dL 7.0  7.8  8.2   Albumin 3.5 - 5.0 g/dL 3.7  4.6  4.6   AST 15 - 41 U/L 22  25  24    ALT 0 - 44 U/L 19  39  70   Alk Phosphatase 38 - 126 U/L 49  105  122   Total Bilirubin 0.3 - 1.2 mg/dL 0.4  0.2  0.2   Bilirubin, Direct 0.00 - 0.40 mg/dL  <1.91  <4.78       Current Medications:   Current Outpatient Medications (Endocrine & Metabolic):    levothyroxine (SYNTHROID) 75 MCG tablet, TAKE 1 TABLET (75 MCG TOTAL) BY MOUTH EVERY MORNING. 30 MINUTES BEFORE FOOD  Current Outpatient Medications (Cardiovascular):     triamterene-hydrochlorothiazide (DYAZIDE) 37.5-25 MG capsule, TAKE 1 CAPSULE BY MOUTH EVERY DAY   Current Outpatient Medications (Analgesics):    acetaminophen (TYLENOL) 325 MG tablet, Take 650 mg by mouth every 6 (six) hours as needed. OTC PRN  Current Outpatient Medications (Hematological):    Cyanocobalamin (B-12 PO), Take by mouth.   ferrous sulfate 325 (65 FE) MG tablet, Take 1 tablet (325 mg total) by mouth every other day.  Current Outpatient Medications (Other):    Calcium Citrate-Vitamin D (CALCIUM + D PO), Take 1 tablet by mouth daily.   pantoprazole (PROTONIX) 40 MG tablet, Take 1 tablet (40 mg total) by mouth daily.   QUEtiapine (SEROQUEL) 50 MG tablet, TAKE 1 TABLET BY MOUTH EVERYDAY AT BEDTIME   venlafaxine XR (EFFEXOR-XR) 75 MG 24 hr capsule, TAKE THREE CAPSULES BY MOUTH ONCE DAILY  Medical History:  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   Associated with high-grade dysplasia   Anxiety and depression    Breast cancer (HCC) 2002   left breast cancer    Depression    Diverticulosis of colon    DVT (deep venous thrombosis) (HCC) 2021   provoked by surgery   Essential hypertension, benign    Family history of pancreatic cancer    GERD (gastroesophageal reflux disease)    Hepatitis    unsure of which type   Hiatal hernia    High grade squamous intraepithelial cervical dysplasia 05/2017   Associated with adenocarcinoma in situ   History of adenomatous polyp of colon    tubular adenoma's  02-08-2017   History of gastritis    History of left breast cancer 2002---- per pt no recurrence   dx DCIS --- s/p  left breast lumpectomy;  per pt radiation therapy completed same year and completed antiestrogen therapy    History of radiation therapy    2002   left breast   History of trigeminal neuralgia    Hypothyroidism    Internal hemorrhoids    Pain    right ear and jaw   Personal history of radiation therapy     Spinal stenosis    Spondylolisthesis    SVD (spontaneous vaginal delivery)    x 1   Tubular adenoma of colon    Allergies: No Known Allergies   Surgical History:  She  has a past surgical history that includes Cesarean section (1990); Esophagogastroduodenoscopy (last one 11-24-2016   dr Philbert Riser); Colonoscopy (02-08-2017   dr Philbert Riser); Cervical conization w/bx (N/A, 06/08/2017); Dilatation & curettage/hysteroscopy with myosure (N/A, 06/08/2017); Breast lumpectomy (Left, 2002); Tooth extraction; Wisdom tooth extraction; Transforaminal lumbar interbody fusion (tlif)  with pedicle screw fixation 2 level (N/A, 04/13/2018); Robotic assisted total hysterectomy with bilateral salpingo oophorectomy (N/A, 09/21/2017); and Craniectomy. Family History:  Her family history includes Dementia in her father; Diabetes in her mother; Heart disease in her father; Hyperlipidemia in her father; Hypertension in her mother; Pancreatic cancer (age of onset: 48) in her cousin; Schizophrenia in her brother; Thyroid disease in her sister.  REVIEW OF SYSTEMS  : All other systems reviewed and negative except where noted in the History of Present Illness.  PHYSICAL EXAM: BP 124/70   Pulse 78   Ht 5\' 2"  (1.575 m)   Wt 177 lb (80.3 kg)   LMP  (LMP Unknown) Comment: not sexually active  SpO2 96%   BMI 32.37 kg/m  General Appearance: Well nourished, in no apparent distress. Head:   Normocephalic and atraumatic. Eyes:  sclerae anicteric,conjunctive pink  Respiratory: Respiratory effort normal, BS equal bilaterally without rales, rhonchi, wheezing. Cardio: RRR with no MRGs. Peripheral pulses intact.  Abdomen: Soft,  Obese ,active bowel sounds. moderate tenderness in the epigastrium. Without guarding and Without rebound. No masses. Rectal: Not evaluated Musculoskeletal: Full ROM, Normal gait. Without edema. Skin:  Dry and intact without significant lesions or rashes Neuro: Alert and  oriented x4;  No focal  deficits. Psych:  Cooperative. Normal mood and affect.    Doree Albee, PA-C 8:45 AM

## 2023-06-16 ENCOUNTER — Other Ambulatory Visit (INDEPENDENT_AMBULATORY_CARE_PROVIDER_SITE_OTHER): Payer: Medicare HMO

## 2023-06-16 ENCOUNTER — Encounter: Payer: Self-pay | Admitting: Physician Assistant

## 2023-06-16 ENCOUNTER — Ambulatory Visit: Payer: Medicare HMO | Admitting: Physician Assistant

## 2023-06-16 ENCOUNTER — Ambulatory Visit: Payer: Self-pay | Admitting: *Deleted

## 2023-06-16 VITALS — BP 124/70 | HR 78 | Ht 62.0 in | Wt 177.0 lb

## 2023-06-16 DIAGNOSIS — D509 Iron deficiency anemia, unspecified: Secondary | ICD-10-CM

## 2023-06-16 DIAGNOSIS — K449 Diaphragmatic hernia without obstruction or gangrene: Secondary | ICD-10-CM

## 2023-06-16 DIAGNOSIS — K760 Fatty (change of) liver, not elsewhere classified: Secondary | ICD-10-CM | POA: Diagnosis not present

## 2023-06-16 DIAGNOSIS — K219 Gastro-esophageal reflux disease without esophagitis: Secondary | ICD-10-CM | POA: Diagnosis not present

## 2023-06-16 LAB — CBC WITH DIFFERENTIAL/PLATELET
Basophils Absolute: 0 10*3/uL (ref 0.0–0.1)
Basophils Relative: 0.9 % (ref 0.0–3.0)
Eosinophils Absolute: 0.2 10*3/uL (ref 0.0–0.7)
Eosinophils Relative: 4.2 % (ref 0.0–5.0)
HCT: 34 % — ABNORMAL LOW (ref 36.0–46.0)
Hemoglobin: 10.9 g/dL — ABNORMAL LOW (ref 12.0–15.0)
Lymphocytes Relative: 40.6 % (ref 12.0–46.0)
Lymphs Abs: 2 10*3/uL (ref 0.7–4.0)
MCHC: 32 g/dL (ref 30.0–36.0)
MCV: 85.5 fL (ref 78.0–100.0)
Monocytes Absolute: 0.4 10*3/uL (ref 0.1–1.0)
Monocytes Relative: 8.7 % (ref 3.0–12.0)
Neutro Abs: 2.2 10*3/uL (ref 1.4–7.7)
Neutrophils Relative %: 45.6 % (ref 43.0–77.0)
Platelets: 347 10*3/uL (ref 150.0–400.0)
RBC: 3.97 Mil/uL (ref 3.87–5.11)
RDW: 21.1 % — ABNORMAL HIGH (ref 11.5–15.5)
WBC: 4.9 10*3/uL (ref 4.0–10.5)

## 2023-06-16 NOTE — Patient Instructions (Addendum)
Your provider has requested that you go to the basement level for lab work before leaving today. Press "B" on the elevator. The lab is located at the first door on the left as you exit the elevator.   Please take your proton pump inhibitor medication, protonix 40 mg daily  Please take this medication 30 minutes to 1 hour before meals- this makes it more effective.  Avoid spicy and acidic foods Avoid fatty foods Limit your intake of coffee, tea, alcohol, and carbonated drinks Work to maintain a healthy weight Keep the head of the bed elevated at least 3 inches with blocks or a wedge pillow if you are having any nighttime symptoms Stay upright for 2 hours after eating Avoid meals and snacks three to four hours before bedtime   325 mg iron once  a day, with orange juice or vitamin C to absorb better.  Increase food rich in iron.  Can cause constipation, can add on miralax or the fiber.  If you can not tolerate it, let us know and we can get you an IV iron.   Metabolic dysfunction associated seatohepatitis  Now the leading cause of liver failure in the [place].  It is normally from such risk factors as obesity, diabetes, insulin resistance, high cholesterol, or metabolic syndrome.  The only definitive therapy is weight loss and exercise.   Suggest walking 20-30 mins daily.  Decreasing carbohydrates, increasing veggies.    Fatty Liver Fatty liver is the accumulation of fat in liver cells. It is also called hepatosteatosis or steatohepatitis. It is normal for your liver to contain some fat. If fat is more than 5 to 10% of your liver's weight, you have fatty liver.  There are often no symptoms (problems) for years while damage is still occurring. People often learn about their fatty liver when they have medical tests for other reasons. Fat can damage your liver for years or even decades without causing problems. When it becomes severe, it can cause fatigue, weight loss, weakness, and  confusion. This makes you more likely to develop more serious liver problems. The liver is the largest organ in the body. It does a lot of work and often gives no warning signs when it is sick until late in a disease. The liver has many important jobs including: Breaking down foods. Storing vitamins, iron, and other minerals. Making proteins. Making bile for food digestion. Breaking down many products including medications, alcohol and some poisons.  PROGNOSIS  Fatty liver may cause no damage or it can lead to an inflammation of the liver. This is, called steatohepatitis.  Over time the liver may become scarred and hardened. This condition is called cirrhosis. Cirrhosis is serious and may lead to liver failure or cancer. NASH is one of the leading causes of cirrhosis. About 10-20% of Americans have fatty liver and a smaller 2-5% has NASH.  TREATMENT  Weight loss, fat restriction, and exercise in overweight patients produces inconsistent results but is worth trying. Good control of diabetes may reduce fatty liver. Eat a balanced, healthy diet. Increase your physical activity. There are no medical or surgical treatments for a fatty liver or NASH, but improving your diet and increasing your exercise may help prevent or reverse some of the damage.    _______________________________________________________  If your blood pressure at your visit was 140/90 or greater, please contact your primary care physician to follow up on this.  _______________________________________________________  If you are age 63 or older, your body mass  index should be between 23-30. Your Body mass index is 32.37 kg/m. If this is out of the aforementioned range listed, please consider follow up with your Primary Care Provider.  If you are age 68 or younger, your body mass index should be between 19-25. Your Body mass index is 32.37 kg/m. If this is out of the aformentioned range listed, please consider follow up  with your Primary Care Provider.   ________________________________________________________  The  GI providers would like to encourage you to use [facility] to communicate with providers for non-urgent requests or questions.  Due to long hold times on the telephone, sending your provider a message by [facility] may be a faster and more efficient way to get a response.  Please allow 48 business hours for a response.  Please remember that this is for non-urgent requests.  _______________________________________________________ It was a pleasure to see you today!  Thank you for trusting me with your gastrointestinal care!

## 2023-06-17 NOTE — Patient Outreach (Signed)
  Care Coordination   Follow Up Visit Note   06/17/2023 Name: Sherry Anderson MRN: [id number] DOB: 1960-12-10  Sherry Anderson is a 62 y.o. year old female who sees Sherry Anderson, Sherry Ryder, MD for primary care. I spoke with  Evern Anderson by phone today.  What matters to the patients health and wellness today?  Planning for an endoscopy 11/24.    Goals Addressed             This Visit's Progress    Reduce symptoms of depression and loneliness       Activities and task to complete in order to accomplish goals.   Call or go to Department of Social Services inquire about Food Stamps Call your insurance provider for more information about your Enhanced Benefits  Follow up on food resources discussed [facility]  [employer] (select "find help") - Find Food  [place], [place] (ghpfa.org) or download the free app to your smart phone Food and Nutrition Services - (936)006-7266 or apply online at [facility] - ePASS You have decided not to move forward with counseling and would like to work with me on brief coping skills to assist you with managing your depression and feelings of loneliness Start / continue relaxed breathing 3 times daily Keep all upcoming appointment discussed today Continue with compliance of taking medication prescribed by Doctor Discuss with PCP at next visit (05/12/23) possible adjustments/changes to RX for depression Consider volunteer or part time work with flowers or something else you enjoy         SDOH assessments and interventions completed:  Yes     Care Coordination Interventions:  Yes, provided  Interventions Today    Flowsheet Row Most Recent Value  Chronic Disease   Chronic disease during today's visit Other  [depression]  General Interventions   General Interventions Discussed/Reviewed General Interventions Discussed, Doctor Visits  [Pt shares need for food resources to be emailed to her]  Doctor Visits  Discussed/Reviewed Doctor Visits Discussed  [Pt planning for endoscopy 11/26]  Education Interventions   Education Provided Provided Education  Provided Verbal Education On Mental Health/Coping with Illness, Medication, [facility]  [Pt reports being started on new RX for depression and wants to wait on considering counseling at this time.]  Mental Health Interventions   Mental Health Discussed/Reviewed Mental Health Discussed, Mental Health Reviewed, Coping Strategies, Depression, Grief and Loss  [Pt is hoping the RX added by PCP will help with her depression- wants to wait on counseling referral being made]  Pharmacy Interventions   Pharmacy Dicussed/Reviewed Pharmacy Topics Discussed, Pharmacy Topics Reviewed  [Pt has added [place] and denies concerns at this time]  Safety Interventions   Safety Discussed/Reviewed Safety Discussed  [Pt reminded to call [phone] for mental health crisis/support if needed 24/7]       Follow up plan: Follow up call scheduled for 07/04/23    Encounter Outcome:  Patient Visit Completed

## 2023-06-17 NOTE — Patient Instructions (Signed)
Visit Information  Thank you for taking time to visit with me today. Please don't hesitate to contact me if I can be of assistance to you.   Following are the goals we discussed today:   Goals Addressed             This Visit's Progress    Reduce symptoms of depression and loneliness       Activities and task to complete in order to accomplish goals.   Call or go to Department of Social Services inquire about Food Stamps Call your insurance provider for more information about your Enhanced Benefits  Follow up on food resources discussed [facility]  [employer] (select "find help") - Find Food  [place], [place] (ghpfa.org) or download the free app to your smart phone Food and Nutrition Services - [phone] or apply online at [facility] - ePASS You have decided not to move forward with counseling and would like to work with me on brief coping skills to assist you with managing your depression and feelings of loneliness Start / continue relaxed breathing 3 times daily Keep all upcoming appointment discussed today Continue with compliance of taking medication prescribed by Doctor Discuss with PCP at next visit (05/12/23) possible adjustments/changes to RX for depression Consider volunteer or part time work with flowers or something else you enjoy         Our next appointment is by telephone on 07/04/23  Please call the care guide team at [phone] if you need to cancel or reschedule your appointment.   If you are experiencing a Mental Health or Behavioral Health Crisis or need someone to talk to, please call the Suicide and Crisis Lifeline: 988 call 911   The patient verbalized understanding of instructions, educational materials, and care plan provided today and DECLINED offer to receive copy of patient instructions, educational materials, and care plan.   Telephone follow up appointment with care management team member scheduled for: 07/04/23  Reece Levy, MSW, LCSW [place]  [facility], [facility] Licensed Clinical Social Worker Care Coordinator  [phone]

## 2023-06-24 ENCOUNTER — Telehealth: Payer: Self-pay | Admitting: *Deleted

## 2023-06-24 NOTE — Progress Notes (Unsigned)
  Care Coordination Note  06/24/2023 Name: Sherry Anderson MRN: [id number] DOB: [birth year]  [name] is a 62 y.o. year old female who is a primary care patient of Latrelle Dodrill, MD and is actively engaged with the care management team. I reached out to Evern Bio by phone today to assist with re-scheduling a follow up visit with the RN Case Manager  Follow up plan: Unsuccessful telephone outreach attempt made. A HIPAA compliant phone message was left for the patient providing contact information and requesting a return call.   [facility]  Care Coordination Care Guide  Direct Dial: [phone]

## 2023-06-28 ENCOUNTER — Encounter: Payer: Self-pay | Admitting: Internal Medicine

## 2023-06-30 NOTE — Progress Notes (Signed)
  Care Coordination Note  06/30/2023 Name: Sherry Anderson MRN: [id number] DOB: 01/06/1961  Sherry Anderson is a 62 y.o. year old female who is a primary care patient of [name] [name], MD and is actively engaged with the care management team. I reached out to Evern Bio by phone today to assist with re-scheduling a follow up visit with the RN Case Manager  Follow up plan: Telephone appointment with care management team member scheduled for:07/01/23  [facility] Coordination Care Guide  Direct Dial: [phone]

## 2023-07-01 ENCOUNTER — Other Ambulatory Visit: Payer: Self-pay | Admitting: *Deleted

## 2023-07-01 DIAGNOSIS — Z599 Problem related to housing and economic circumstances, unspecified: Secondary | ICD-10-CM

## 2023-07-01 DIAGNOSIS — I10 Essential (primary) hypertension: Secondary | ICD-10-CM

## 2023-07-01 NOTE — Patient Instructions (Signed)
Visit Information  Thank you for taking time to visit with me today. Please don't hesitate to contact me if I can be of assistance to you before our next scheduled telephone appointment.  Following are the goals we discussed today:   Goals Addressed             This Visit's Progress    RNCM Care Management Expected Outcome: Monitor, Self-Manage and Reduce Symptoms of: HTN, Anemia, Depression       Current Barriers:  Knowledge Deficits related to plan of care for management of HTN and Anemia  Care Coordination needs related to Limited access to food Chronic Disease Management support and education needs related to HTN and Anemia  Financial Constraints   RNCM Clinical Goal(s):  Patient will verbalize basic understanding of  HTN and Anemia disease process and self health management plan as evidenced by verbal explanation, recognizing symptoms, lifestyle modifications, daily monitoring take all medications exactly as prescribed and will call provider for medication related questions as evidenced by compliance with all medications attend all scheduled medical appointments: with primary care provider and specialists as evidenced by keeping all scheduled appointments demonstrate Improved and Ongoing adherence to prescribed treatment plan for HTN and Anemia as evidenced by consistent medication compliance, symptom monitoring, continued lifestyle modifications and continued self-management continue to work with RN Care Manager to address care management and care coordination needs related to  HTN and Anemia as evidenced by adherence to CM Team Scheduled appointments work with Child psychotherapist to address  related to the management of Limited access to food to apply for food stamps Interventions: Evaluation of current treatment plan related to  self management and patient's adherence to plan as established by provider   Anemia/Bleeding Interventions:  (Status:  New goal. and Goal on track:  Yes.)  Long Term Goal Assessment of understanding of anemia/bleeding disorder diagnosis Basic overview and discussion of anemia/bleeding disorder or acute disease state Medications reviewed Importance of self-monitoring for signs/symptoms of bleeding Counseled on avoidance of NSAIDs due to increased bleeding risk Counseled on importance of regular laboratory monitoring as directed by provider Advised to call provider or 911 if active bleeding or signs and symptoms of active bleeding occur recommended promotion of rest and energy-conserving measures to manage fatigue, such as balancing activity with periods of rest encouraged strategies to prevent falls related to fatigue, weakness and dizziness; encouraged sitting before standing and using an assistive device encouraged optimal oral intake to support fluid balance and nutrition Lab Results  Component Value Date   WBC 4.9 06/16/2023   HGB 10.9 (L) 06/16/2023   HCT 34.0 (L) 06/16/2023   MCV 85.5 06/16/2023   PLT 347.0 06/16/2023    Hypertension Interventions:  (Status:  New goal. and Goal on track:  Yes.) Long Term Goal Last practice recorded BP readings:  BP Readings from Last 3 Encounters:  06/16/23 124/70  06/09/23 (!) 98/58  06/06/23 137/68   Most recent eGFR/CrCl:  Lab Results  Component Value Date   EGFR 72 06/09/2023    No components found for: "CRCL"  Evaluation of current treatment plan related to hypertension self management and patient's adherence to plan as established by provider. Patient reports that she was feeling "woozy in my head" and she checked her blood pressure & it was 183/88 and stated she took her BP medications even though it was discontinued. RNCM asked patient to recheck her BP during call because it had been 2 hours since she took the medication. Repeat BP  is 152/80. Patient wants RNCM to send message to provider. Patient has follow up appointment on 07-05-2023. Provided education to patient re: stroke  prevention, s/s of heart attack and stroke Reviewed medications with patient and discussed importance of compliance Counseled on adverse effects of illicit drug and excessive alcohol use in patients with high blood pressure  Counseled on the importance of exercise goals with target of 150 minutes per week Discussed plans with patient for ongoing care management follow up and provided patient with direct contact information for care management team Advised patient, providing education and rationale, to monitor blood pressure daily and record, calling PCP for findings outside established parameters Reviewed scheduled/upcoming provider appointments including: PCP 07-05-2023 Discussed complications of poorly controlled blood pressure such as heart disease, stroke, circulatory complications, vision complications, kidney impairment, sexual dysfunction  Patient Goals/Self-Care Activities: Take all medications as prescribed Attend all scheduled provider appointments Call pharmacy for medication refills 3-7 days in advance of running out of medications Attend church or other social activities Call provider office for new concerns or questions  Work with the social worker to address care coordination needs and will continue to work with the clinical team to address health care and disease management related needs  Follow Up Plan:  Telephone follow up appointment with care management team member scheduled for:  08-05-2023 at 2:30 pm           Our next appointment is by telephone on 08-05-2023 at 2:30 pm  Please call the care guide team at [phone] if you need to cancel or reschedule your appointment.   If you are experiencing a Mental Health or Behavioral Health Crisis or need someone to talk to, please call the Suicide and Crisis Lifeline: 988 call the [place] National Suicide Prevention Lifeline: 936 546 9439 or TTY: 727-622-8469 TTY 310-257-9905) to talk to a trained counselor call  1-800-273-TALK (toll free, 24 hour hotline) go to [facility] [address] [place] [phone])   Patient verbalizes understanding of instructions and care plan provided today and agrees to view in MyChart. Active MyChart status and patient understanding of how to access instructions and care plan via MyChart confirmed with patient.     Telephone follow up appointment with care management team member scheduled for: 08-05-2023 at 2:30 pm  Danise Edge, BSN RN RN Care Manager  [facility]  Ambulatory Care Management  Direct Number: [phone]

## 2023-07-01 NOTE — Patient Outreach (Signed)
Care Management   Visit Note  07/01/2023 Name: Sherry Anderson MRN: [id number] DOB: [birth year]  Subjective: Sherry Anderson is a 62 y.o. year old female who is a primary care patient of Sherry Meyer [name], MD. The Care Management team was consulted for assistance.      Engaged with patient spoke with patient by telephone.    Goals Addressed             This Visit's Progress    RNCM Care Management Expected Outcome: Monitor, Self-Manage and Reduce Symptoms of: HTN, Anemia, Depression       Current Barriers:  Knowledge Deficits related to plan of care for management of HTN and Anemia  Care Coordination needs related to Limited access to food Chronic Disease Management support and education needs related to HTN and Anemia  Financial Constraints   RNCM Clinical Goal(s):  Patient will verbalize basic understanding of  HTN and Anemia disease process and self health management plan as evidenced by verbal explanation, recognizing symptoms, lifestyle modifications, daily monitoring take all medications exactly as prescribed and will call provider for medication related questions as evidenced by compliance with all medications attend all scheduled medical appointments: with primary care provider and specialists as evidenced by keeping all scheduled appointments demonstrate Improved and Ongoing adherence to prescribed treatment plan for HTN and Anemia as evidenced by consistent medication compliance, symptom monitoring, continued lifestyle modifications and continued self-management continue to work with RN Care Manager to address care management and care coordination needs related to  HTN and Anemia as evidenced by adherence to CM Team Scheduled appointments work with Child psychotherapist to address  related to the management of Limited access to food to apply for food stamps Interventions: Evaluation of current treatment plan related to  self management and patient's adherence to plan  as established by provider   Anemia/Bleeding Interventions:  (Status:  New goal. and Goal on track:  Yes.) Long Term Goal Assessment of understanding of anemia/bleeding disorder diagnosis Basic overview and discussion of anemia/bleeding disorder or acute disease state Medications reviewed Importance of self-monitoring for signs/symptoms of bleeding Counseled on avoidance of NSAIDs due to increased bleeding risk Counseled on importance of regular laboratory monitoring as directed by provider Advised to call provider or 911 if active bleeding or signs and symptoms of active bleeding occur recommended promotion of rest and energy-conserving measures to manage fatigue, such as balancing activity with periods of rest encouraged strategies to prevent falls related to fatigue, weakness and dizziness; encouraged sitting before standing and using an assistive device encouraged optimal oral intake to support fluid balance and nutrition Lab Results  Component Value Date   WBC 4.9 06/16/2023   HGB 10.9 (L) 06/16/2023   HCT 34.0 (L) 06/16/2023   MCV 85.5 06/16/2023   PLT 347.0 06/16/2023    Hypertension Interventions:  (Status:  New goal. and Goal on track:  Yes.) Long Term Goal Last practice recorded BP readings:  BP Readings from Last 3 Encounters:  06/16/23 124/70  06/09/23 (!) 98/58  06/06/23 137/68   Most recent eGFR/CrCl:  Lab Results  Component Value Date   EGFR 72 06/09/2023    No components found for: "CRCL"  Evaluation of current treatment plan related to hypertension self management and patient's adherence to plan as established by provider. Patient reports that she was feeling "woozy in my head" and she checked her blood pressure & it was 183/88 and stated she took her BP medications even though it was discontinued. RNCM asked  patient to recheck her BP during call because it had been 2 hours since she took the medication. Repeat BP is 152/80. Patient wants RNCM to send message to  provider. Patient has follow up appointment on 07-05-2023. Provided education to patient re: stroke prevention, s/s of heart attack and stroke Reviewed medications with patient and discussed importance of compliance Counseled on adverse effects of illicit drug and excessive alcohol use in patients with high blood pressure  Counseled on the importance of exercise goals with target of 150 minutes per week Discussed plans with patient for ongoing care management follow up and provided patient with direct contact information for care management team Advised patient, providing education and rationale, to monitor blood pressure daily and record, calling PCP for findings outside established parameters Reviewed scheduled/upcoming provider appointments including: PCP 07-05-2023 Discussed complications of poorly controlled blood pressure such as heart disease, stroke, circulatory complications, vision complications, kidney impairment, sexual dysfunction  Patient Goals/Self-Care Activities: Take all medications as prescribed Attend all scheduled provider appointments Call pharmacy for medication refills 3-7 days in advance of running out of medications Attend church or other social activities Call provider office for new concerns or questions  Work with the social worker to address care coordination needs and will continue to work with the clinical team to address health care and disease management related needs  Follow Up Plan:  Telephone follow up appointment with care management team member scheduled for:  08-05-2023 at 2:30 pm           Consent to Services:  Patient was given information about care management services, agreed to services, and gave verbal consent to participate.   Plan: Telephone follow up appointment with care management team member scheduled for:08-05-2023 at 2:30 pm  Danise Edge, BSN RN RN Care Manager  [facility]  Ambulatory Care Management  Direct Number:  [phone]

## 2023-07-03 ENCOUNTER — Other Ambulatory Visit: Payer: Self-pay | Admitting: Family Medicine

## 2023-07-03 DIAGNOSIS — K219 Gastro-esophageal reflux disease without esophagitis: Secondary | ICD-10-CM

## 2023-07-03 DIAGNOSIS — K449 Diaphragmatic hernia without obstruction or gangrene: Secondary | ICD-10-CM

## 2023-07-03 DIAGNOSIS — E611 Iron deficiency: Secondary | ICD-10-CM

## 2023-07-03 DIAGNOSIS — K257 Chronic gastric ulcer without hemorrhage or perforation: Secondary | ICD-10-CM

## 2023-07-04 ENCOUNTER — Ambulatory Visit: Payer: Self-pay | Admitting: *Deleted

## 2023-07-04 ENCOUNTER — Telehealth: Payer: Self-pay | Admitting: *Deleted

## 2023-07-04 NOTE — Telephone Encounter (Signed)
   Telephone encounter was:  Unsuccessful.  07/04/2023 Name: Sherry Anderson MRN: [id number] DOB: 1960/12/23  Unsuccessful outbound call made today to assist with:  Food Insecurity  Outreach Attempt:  1st Attempt  A HIPAA compliant voice message was left requesting a return call.  Instructed patient to call back at [phone].  [facility]  Population Health Careguide  Direct Dial: [phone] Website: [email or web address]

## 2023-07-04 NOTE — Patient Outreach (Signed)
Care Coordination   Follow Up Visit Note   07/04/2023 Name: Sherry Anderson MRN: [id number] DOB: [birth year]  Sherry Anderson is a 62 y.o. year old female who sees [name], [name], MD for primary care. I spoke with  Evern Bio by phone today.  What matters to the patients health and wellness today?  My blood pressure is running high sometimes. Sleep 10 or more hours and still feel tired.      Goals Addressed             This Visit's Progress    Reduce symptoms of depression and loneliness       Activities and task to complete in order to accomplish goals.   Call or go to Department of Social Services inquire about Food Stamps- I have also emailed you the link to apply online Discuss with PCP at next visit 07/05/23 your concerns about BP medicine- take your BP monitor as discussed Discuss with PCP at visit 07/05/23 your concern about your "sleeping too much" Call your insurance provider for more information about your Enhanced Benefits  Follow up on food resources discussed [facility]  [employer] (select "find help") - Find Food  [place], [place] (ghpfa.org) or download the free app to your smart phone Food and Nutrition Services - 516-639-3549 or apply online at [facility] - ePASS You have decided not to move forward with counseling and would like to work with me on brief coping skills to assist you with managing your depression and feelings of loneliness Start / continue relaxed breathing 3 times daily Keep all upcoming appointment discussed today Continue with compliance of taking medication prescribed by Doctor  Consider volunteer or part time work with flowers or something else you enjoy         SDOH assessments and interventions completed:  Yes  SDOH Interventions Today    Flowsheet Row Most Recent Value  SDOH Interventions   Depression Interventions/Treatment  Medication          07/04/2023    2:02 PM 07/01/2023     3:01 PM 07/01/2023    3:00 PM 06/09/2023   10:15 AM 05/10/2023    1:07 PM  Depression screen PHQ 2/9  Decreased Interest 0 0 0 3 3  Down, Depressed, Hopeless 0 0 0 3 3  PHQ - 2 Score 0 0 0 6 6  Altered sleeping 3   3 3   Tired, decreased energy 2   3 3   Change in appetite 0   0 0  Feeling bad or failure about yourself  0   3 0  Trouble concentrating 0   0 0  Moving slowly or fidgety/restless 1   3 0  Suicidal thoughts 0   0 0  PHQ-9 Score 6   18 12   Difficult doing work/chores Not difficult at all    Not difficult at all    Care Coordination Interventions:  Yes, provided  Interventions Today    Flowsheet Row Most Recent Value  Chronic Disease   Chronic disease during today's visit Hypertension (HTN)  [Pt concerned her BP is high at times and "don't feel good". Recently taken off RX- encouraged to discuss tomorrow at PCP office]  General Interventions   General Interventions Discussed/Reviewed General Interventions Discussed, General Interventions Reviewed, [facility], Doctor Visits  [Pt going to see PCP tomorrow. Encouraged pt to apply for food stamps and seek other food resource options via email I sent for a Food  App]  Doctor Visits Discussed/Reviewed Doctor Visits Discussed  Mental Health Interventions   Mental Health Discussed/Reviewed Mental Health Discussed, Mental Health Reviewed, Coping Strategies, Depression  [Pt reports her depressive symptoms have diminshed- she feels her newly started RX is helping]  Pharmacy Interventions   Pharmacy Dicussed/Reviewed Pharmacy Topics Discussed  [Pt reports she was taken off a BP medicine and is dealing with some high BP readings at times. Plans to address with PCP tomorrow.]       Follow up plan: Follow up call scheduled for 07/24/23    Encounter Outcome:  Patient Visit Completed

## 2023-07-04 NOTE — Patient Instructions (Signed)
Visit Information  Thank you for taking time to visit with me today. Please don't hesitate to contact me if I can be of assistance to you.   Following are the goals we discussed today:   Goals Addressed             This Visit's Progress    Reduce symptoms of depression and loneliness       Activities and task to complete in order to accomplish goals.   Call or go to Department of Social Services inquire about Food Stamps- I have also emailed you the link to apply online Discuss with PCP at next visit 07/05/23 your concerns about BP medicine- take your BP monitor as discussed Discuss with PCP at visit 07/05/23 your concern about your "sleeping too much" Call your insurance provider for more information about your Enhanced Benefits  Follow up on food resources discussed [facility]  [employer] (select "find help") - Find Food  [place], [place] (ghpfa.org) or download the free app to your smart phone Food and Nutrition Services - 215 079 0569 or apply online at [facility] - ePASS You have decided not to move forward with counseling and would like to work with me on brief coping skills to assist you with managing your depression and feelings of loneliness Start / continue relaxed breathing 3 times daily Keep all upcoming appointment discussed today Continue with compliance of taking medication prescribed by Doctor  Consider volunteer or part time work with flowers or something else you enjoy         Our next appointment is by telephone on 12/6   Please call the care guide team at [phone] if you need to cancel or reschedule your appointment.   If you are experiencing a Mental Health or Behavioral Health Crisis or need someone to talk to, please call the Suicide and Crisis Lifeline: 988 call 911   The patient verbalized understanding of instructions, educational materials, and care plan provided today and DECLINED offer to receive copy of patient  instructions, educational materials, and care plan.   Telephone follow up appointment with care management team member scheduled for: 07/22/23 Reece Levy, MSW, LCSW [place]  [facility], [facility] Licensed Clinical Social Worker Care Coordinator  [phone]

## 2023-07-05 ENCOUNTER — Encounter: Payer: Self-pay | Admitting: Family Medicine

## 2023-07-05 ENCOUNTER — Telehealth: Payer: Self-pay | Admitting: *Deleted

## 2023-07-05 ENCOUNTER — Ambulatory Visit: Payer: Medicare HMO | Admitting: Family Medicine

## 2023-07-05 VITALS — BP 126/79 | HR 69 | Ht 62.0 in | Wt 176.0 lb

## 2023-07-05 DIAGNOSIS — E611 Iron deficiency: Secondary | ICD-10-CM | POA: Diagnosis not present

## 2023-07-05 DIAGNOSIS — R400 Somnolence: Secondary | ICD-10-CM | POA: Diagnosis not present

## 2023-07-05 DIAGNOSIS — F39 Unspecified mood [affective] disorder: Secondary | ICD-10-CM | POA: Diagnosis not present

## 2023-07-05 DIAGNOSIS — I10 Essential (primary) hypertension: Secondary | ICD-10-CM

## 2023-07-05 MED ORDER — TRIAMTERENE-HCTZ 37.5-25 MG PO CAPS: ORAL_CAPSULE | ORAL | 3 refills | Status: DC

## 2023-07-05 MED ORDER — LEVOTHYROXINE SODIUM 75 MCG PO TABS: 75.0000 ug | ORAL_TABLET | ORAL | 3 refills | Status: DC

## 2023-07-05 MED ORDER — PANTOPRAZOLE SODIUM 40 MG PO TBEC: 40.0000 mg | DELAYED_RELEASE_TABLET | Freq: Every day | ORAL | 3 refills | Status: DC

## 2023-07-05 MED ORDER — LISINOPRIL 20 MG PO TABS: 20.0000 mg | ORAL_TABLET | Freq: Every day | ORAL | 3 refills | Status: DC

## 2023-07-05 MED ORDER — QUETIAPINE FUMARATE 50 MG PO TABS: 50.0000 mg | ORAL_TABLET | Freq: Every day | ORAL | 2 refills | Status: DC

## 2023-07-05 MED ORDER — FERROUS SULFATE 325 (65 FE) MG PO TABS: 325.0000 mg | ORAL_TABLET | ORAL | 1 refills | Status: DC

## 2023-07-05 MED ORDER — VENLAFAXINE HCL ER 75 MG PO CP24: ORAL_CAPSULE | ORAL | 2 refills | Status: DC

## 2023-07-05 NOTE — Assessment & Plan Note (Signed)
Blood pressure well-controlled on present regimen, tolerating it well.  Has resumed lisinopril at home.  Will continue lisinopril 20 mg daily along with triamterene-HCTZ 37.5-25 mg daily.  Follow-up in 3 months.

## 2023-07-05 NOTE — Telephone Encounter (Signed)
   Telephone encounter was:  Unsuccessful.  07/05/2023 Name: Sherry Anderson MRN: [id number] DOB: [birth year]  Unsuccessful outbound call made today to assist with:  Food Insecurity  Outreach Attempt:  2nd Attempt  A HIPAA compliant voice message was left requesting a return call.  Instructed patient to call back at [phone].  Dione Booze [facility]  Population Health Careguide  Direct Dial: [phone] Website: [email or web address]

## 2023-07-05 NOTE — Assessment & Plan Note (Signed)
Excessive daytime sleepiness along with snoring warrants evaluation for OSA Split night study ordered

## 2023-07-05 NOTE — Assessment & Plan Note (Addendum)
Hemoglobin uptrending on multiple recent checks.  Has EGD scheduled.  Continue iron.

## 2023-07-05 NOTE — Patient Instructions (Signed)
It was great to see you again today.  Restart lisinopril as you already have. Sent in refills of your medications  Ordered sleep study-  someone will call you about it  Follow up with me in 3 months, sooner if needed  Be well, Dr. [name]

## 2023-07-05 NOTE — Progress Notes (Signed)
  Date of Visit: 07/05/2023   SUBJECTIVE:   HPI:  Sherry Anderson presents today for routine follow-up.  Anemia: Has upcoming EGD scheduled with GI.  Taking iron every other day.  Tolerating this well.  Denies any blood in her stool.  Hypertension: Currently taking triamterene-HCTZ 37.5-25 mg daily and lisinopril 20 mg daily.  At her last visit we stopped the lisinopril, however she has restarted it in the interim due to elevated systolic blood pressures in the 180s at home.  She did take it yesterday and the day before and is tolerating it well.  Mood: Currently taking venlafaxine to 25 mg daily and Seroquel 50 mg at bedtime.  Tolerating these meds well.  Reports mood is well-controlled on this regimen.  Daytime sleepiness: Notes she is sleepy all throughout the day.  Sleeps well, about 10 hours at night, and still wakes up tired.  Does snore.  Has never had sleep study before.   OBJECTIVE:   BP 126/79   Pulse 69   Ht 5\' 2"  (1.575 m)   Wt 176 lb (79.8 kg)   LMP  (LMP Unknown) Comment: not sexually active  SpO2 99%   BMI 32.19 kg/m  Gen: No acute distress, pleasant, cooperative, well-appearing HEENT: Normocephalic, atraumatic Heart: Regular rate and rhythm, no murmur Lungs: Clear to auscultation bilaterally, normal effort Neuro: Grossly nonfocal, speech normal Ext: No edema bilaterally  ASSESSMENT/PLAN:   Assessment & Plan Essential hypertension, benign Blood pressure well-controlled on present regimen, tolerating it well.  Has resumed lisinopril at home.  Will continue lisinopril 20 mg daily along with triamterene-HCTZ 37.5-25 mg daily.  Follow-up in 3 months. Mood disorder (HCC) Doing well on venlafaxine and Seroquel.  Continue these medications. Daytime sleepiness Excessive daytime sleepiness along with snoring warrants evaluation for OSA Split night study ordered Iron deficiency Hemoglobin uptrending on multiple recent checks.  Has EGD scheduled.  Continue iron.     FOLLOW UP: Follow up in 3 months for above issues  [place] J. [name], MD [facility]

## 2023-07-05 NOTE — Assessment & Plan Note (Signed)
Doing well on venlafaxine and Seroquel.  Continue these medications.

## 2023-07-06 ENCOUNTER — Telehealth: Payer: Self-pay | Admitting: *Deleted

## 2023-07-06 NOTE — Telephone Encounter (Signed)
   Telephone encounter was:  Unsuccessful.  07/06/2023 Name: MARYHELEN MCELENEY MRN: [id number] DOB: [birth year]  Unsuccessful outbound call made today to assist with:  Food Insecurity  Outreach Attempt:  3rd Attempt.  Referral closed unable to contact patient.  A HIPAA compliant voice message was left requesting a return call.  Instructed patient to call back at [phone].  Dione Booze [facility]  Population Health Careguide  Direct Dial: [phone] Website: [email or web address]

## 2023-07-12 ENCOUNTER — Encounter: Payer: Self-pay | Admitting: Internal Medicine

## 2023-07-12 ENCOUNTER — Ambulatory Visit (AMBULATORY_SURGERY_CENTER): Payer: Medicare HMO | Admitting: Internal Medicine

## 2023-07-12 VITALS — BP 112/60 | HR 71 | Temp 97.6°F | Resp 19 | Ht 62.0 in | Wt 177.0 lb

## 2023-07-12 DIAGNOSIS — K219 Gastro-esophageal reflux disease without esophagitis: Secondary | ICD-10-CM

## 2023-07-12 DIAGNOSIS — K449 Diaphragmatic hernia without obstruction or gangrene: Secondary | ICD-10-CM | POA: Diagnosis not present

## 2023-07-12 DIAGNOSIS — K259 Gastric ulcer, unspecified as acute or chronic, without hemorrhage or perforation: Secondary | ICD-10-CM

## 2023-07-12 DIAGNOSIS — K257 Chronic gastric ulcer without hemorrhage or perforation: Secondary | ICD-10-CM | POA: Insufficient documentation

## 2023-07-12 DIAGNOSIS — D509 Iron deficiency anemia, unspecified: Secondary | ICD-10-CM

## 2023-07-12 MED ORDER — SODIUM CHLORIDE 0.9 % IV SOLN: 500.0000 mL | Freq: Once | INTRAVENOUS | Status: DC

## 2023-07-12 NOTE — Progress Notes (Signed)
Pt in recovery with monitors in place, VSS. Report given to receiving RN. Bite guard was placed with pt awake to ensure comfort. No dental or soft tissue damage noted.

## 2023-07-12 NOTE — Patient Instructions (Addendum)
The hiatal hernia is larger and has little ulcers or erosions associated with it. These are called Sheria Lang lesions or ulcers and are the cause of your low iron and anemia. The stomach is getting squeezed by the diaphragm and this is the result. The pain and passing out episode may have been related as well ( I think so). Medication will not fix this - it requires surgery and this should be considered.  I am ordering a special xray called an upper GI series, this will tell us more about the hernia. I will communicate with Dr. Lavon Paganini about a surgical referral as well. This would be a consultation to discuss the potential surgery - not a commitment.  I appreciate the opportunity to care for you. Iva Boop, MD, FACG  YOU HAD AN ENDOSCOPIC PROCEDURE TODAY AT THE [facility]:   Refer to the procedure report that was given to you for any specific questions about what was found during the examination.  If the procedure report does not answer your questions, please call your gastroenterologist to clarify.  If you requested that your care partner not be given the details of your procedure findings, then the procedure report has been included in a sealed envelope for you to review at your convenience later.  YOU SHOULD EXPECT: Some feelings of bloating in the abdomen. Passage of more gas than usual.  Walking can help get rid of the air that was put into your GI tract during the procedure and reduce the bloating. If you had a lower endoscopy (such as a colonoscopy or flexible sigmoidoscopy) you may notice spotting of blood in your stool or on the toilet paper. If you underwent a bowel prep for your procedure, you may not have a normal bowel movement for a few days.  Please Note:  You might notice some irritation and congestion in your nose or some drainage.  This is from the oxygen used during your procedure.  There is no need for concern and it should clear up in a day or so.  SYMPTOMS TO  REPORT IMMEDIATELY:   Following upper endoscopy (EGD)  Vomiting of blood or coffee ground material  New chest pain or pain under the shoulder blades  Painful or persistently difficult swallowing  New shortness of breath  Fever of 100F or higher  Black, tarry-looking stools  For urgent or emergent issues, a gastroenterologist can be reached at any hour by calling [phone]. Do not use MyChart messaging for urgent concerns.    DIET:  We do recommend a small meal at first, but then you may proceed to your regular diet.  Drink plenty of fluids but you should avoid alcoholic beverages for 24 hours.  ACTIVITY:  You should plan to take it easy for the rest of today and you should NOT DRIVE or use heavy machinery until tomorrow (because of the sedation medicines used during the test).    FOLLOW UP: Our staff will call the number listed on your records the next business day following your procedure.  We will call around 7:15- 8:00 am to check on you and address any questions or concerns that you may have regarding the information given to you following your procedure. If we do not reach you, we will leave a message.     If any biopsies were taken you will be contacted by phone or by letter within the next 1-3 weeks.  Please call us at [phone] if you have not heard  about the biopsies in 3 weeks.    SIGNATURES/CONFIDENTIALITY: You and/or your care partner have signed paperwork which will be entered into your electronic medical record.  These signatures attest to the fact that that the information above on your After Visit Summary has been reviewed and is understood.  Full responsibility of the confidentiality of this discharge information lies with you and/or your care-partner.

## 2023-07-12 NOTE — Op Note (Addendum)
[facility] Patient Name: Sherry Anderson Procedure Date: 07/12/2023 9:35 AM MRN: [id number] Endoscopist: Iva Boop , MD, [id number] Age: 62 Referring MD:  Date of Birth: 1961/07/01 Gender: Female Account #: [id number] Procedure:                Upper GI endoscopy Indications:              Iron deficiency anemia, Suspected gastro-esophageal                            reflux disease, Hiatal hernia Medicines:                Monitored Anesthesia Care Procedure:                Pre-Anesthesia Assessment:                           - Prior to the procedure, a History and Physical                            was performed, and patient medications and                            allergies were reviewed. The patient's tolerance of                            previous anesthesia was also reviewed. The risks                            and benefits of the procedure and the sedation                            options and risks were discussed with the patient.                            All questions were answered, and informed consent                            was obtained. Prior Anticoagulants: The patient has                            taken no anticoagulant or antiplatelet agents. ASA                            Grade Assessment: II - A patient with mild systemic                            disease. After reviewing the risks and benefits,                            the patient was deemed in satisfactory condition to                            undergo the procedure.  After obtaining informed consent, the endoscope was                            passed under direct vision. Throughout the                            procedure, the patient's blood pressure, pulse, and                            oxygen saturations were monitored continuously. The                            Olympus Scope SN [id number] was introduced through the                            mouth, and  advanced to the second part of duodenum.                            The upper GI endoscopy was accomplished without                            difficulty. The patient tolerated the procedure                            well. Scope In: Scope Out: Findings:                 A large hiatal hernia with multiple Cameron ulcers                            was found. The hiatal narrowing was 40 cm from the                            incisors. The Z-line was 30 cm from the incisors.                           The exam was otherwise without abnormality.                           The gastroesophageal flap valve was visualized                            endoscopically and classified as Hill Grade IV (no                            fold, wide open lumen, hiatal hernia present).                           The cardia and gastric fundus were otherwise normal                            on retroflexion. Complications:            No immediate complications. Estimated Blood Loss:     Estimated blood loss: none. Impression:               -  Large hiatal hernia with multiple Cameron ulcers.                           - The examination was otherwise normal.                           - Gastroesophageal flap valve classified as Hill                            Grade IV (no fold, wide open lumen, hiatal hernia                            present).                           - No specimens collected. Recommendation:           - Patient has a contact number available for                            emergencies. The signs and symptoms of potential                            delayed complications were discussed with the                            patient. Return to normal activities tomorrow.                            Written discharge instructions were provided to the                            patient.                           - Continue present medications.                           - GERD prevention diet.                            - Will have office order Upper GI series to better                            characterize liarge hiatal hernia                           Will communicate with primary GI MD (Nandigam) re:                            surgical referral to consider repair of hiatal                            hernia. Iva Boop, MD 07/12/2023 10:02:48 AM This report has been signed electronically.

## 2023-07-12 NOTE — Progress Notes (Signed)
History and Physical Interval Note:  07/12/2023 9:31 AM  Sherry Anderson  has presented today for endoscopic procedure(s), with the diagnosis of  Encounter Diagnosis  Name Primary?   Gastroesophageal reflux disease, unspecified whether esophagitis present Yes  .  The various methods of evaluation and treatment have been discussed with the patient and/or family. After consideration of risks, benefits and other options for treatment, the patient has consented to  the endoscopic procedure(s).   The patient's history has been reviewed, patient examined, no change in status, stable for endoscopic procedure(s).  I have reviewed the patient's chart and labs.  Questions were answered to the patient's satisfaction.     Iva Boop, MD, [facility]

## 2023-07-12 NOTE — Progress Notes (Signed)
Pt's states no medical or surgical changes since previsit or office visit. 

## 2023-07-13 ENCOUNTER — Telehealth: Payer: Self-pay | Admitting: *Deleted

## 2023-07-13 NOTE — Telephone Encounter (Signed)
Attempted to call patient for their post-procedure follow-up call. No answer. Left voicemail.   

## 2023-07-19 ENCOUNTER — Telehealth: Payer: Self-pay

## 2023-07-19 DIAGNOSIS — K449 Diaphragmatic hernia without obstruction or gangrene: Secondary | ICD-10-CM

## 2023-07-19 NOTE — Telephone Encounter (Signed)
Per Dr Leone Payor a upper GI series has been set up for 07/21/2023 at [facility] for 10:00am, arrive at 9:45AM. Patient aware of date/time and she wrote it down.

## 2023-07-21 ENCOUNTER — Ambulatory Visit (HOSPITAL_COMMUNITY)
Admission: RE | Admit: 2023-07-21 | Discharge: 2023-07-21 | Disposition: A | Discharge status: Home / Self Care | Payer: Medicare HMO | Source: Ambulatory Visit | Attending: Internal Medicine | Admitting: Internal Medicine

## 2023-07-21 DIAGNOSIS — K449 Diaphragmatic hernia without obstruction or gangrene: Secondary | ICD-10-CM | POA: Diagnosis not present

## 2023-07-21 DIAGNOSIS — K224 Dyskinesia of esophagus: Secondary | ICD-10-CM | POA: Diagnosis not present

## 2023-07-22 NOTE — Telephone Encounter (Signed)
Called discussed upper GI evaluation with patient. UGI showed large hiatal hernia with tertiary esophageal contractions consistent with EGD recently with Dr. Leone Payor that showed large hiatal hernia with Sheria Lang lesions. She is on Protonix 40 mg once daily, doing GERD reflux diet and denies any reflux symptoms or trouble swallowing.  Still has some upper abdominal discomfort on occasion. Discussed we will plan on referring to [facility] for evaluation of large hiatal hernia repair. Will send esophageal dysphagia guidelines with the patient, continue Protonix 40 mg once daily. Will also send to Dr. Lavon Paganini she has any other further recommendations.

## 2023-07-22 NOTE — Telephone Encounter (Signed)
Referral faxed to [facility] as requested.

## 2023-07-25 ENCOUNTER — Encounter: Payer: Self-pay | Admitting: *Deleted

## 2023-07-25 ENCOUNTER — Telehealth: Payer: Self-pay | Admitting: *Deleted

## 2023-07-25 ENCOUNTER — Telehealth: Payer: Self-pay | Admitting: Physician Assistant

## 2023-07-25 NOTE — Telephone Encounter (Signed)
-----   Message from Stan Head sent at 07/25/2023 12:31 PM EST ----- Regarding: surgery referral I think I routed her UGI to you - large HH and cameron erosions at EGD  Was thinking eval for hernia repair makes sense and since she is your patient wanted to defer next steps and choice of surgeon to you.  Thanks  Baldo Ash

## 2023-07-25 NOTE — Telephone Encounter (Signed)
Referral has already been faxed to [facility].

## 2023-07-25 NOTE — Telephone Encounter (Signed)
Please send referral to Dr. Gaynelle Adu at [facility] for hiatal hernia repair.  Thank you

## 2023-07-25 NOTE — Patient Outreach (Signed)
  Care Coordination   07/25/2023 Name: Sherry Anderson MRN: [id number] DOB: [birth year]   Care Coordination Outreach Attempts:  An unsuccessful telephone outreach was attempted today to offer the patient information about available care coordination services.  Follow Up Plan:  Additional outreach attempts will be made to offer the patient care coordination information and services.   Encounter Outcome:  No Answer   Care Coordination Interventions:  No, not indicated    Reece Levy, MSW, LCSW [place], [facility] Licensed Clinical Social Worker Care Coordinator  [phone]

## 2023-07-25 NOTE — Telephone Encounter (Signed)
I had called and spoke with the patient December 6 in regards to her upper GI.  She was still having reflux but doing okay on twice daily PPI.  I was given her dysmotility information, told to eat small frequent meals and will place referral to [facility] for evaluation of large hiatal hernia repair.  Patient agreed.  Will refer to [facility].

## 2023-07-26 ENCOUNTER — Telehealth: Payer: Self-pay | Admitting: *Deleted

## 2023-07-26 NOTE — Patient Outreach (Signed)
  Care Coordination   07/26/2023 Name: Sherry Anderson MRN: [id number] DOB: [birth year]   Care Coordination Outreach Attempts:  An unsuccessful telephone outreach was attempted today to offer the patient information about available care coordination services.  Follow Up Plan:  Additional outreach attempts will be made to offer the patient care coordination information and services.   Encounter Outcome:  No Answer   Care Coordination Interventions:  No, not indicated    Reece Levy, MSW, LCSW [place], [facility] Licensed Clinical Social Worker Care Coordinator  [phone]

## 2023-07-27 ENCOUNTER — Telehealth: Payer: Self-pay | Admitting: *Deleted

## 2023-07-27 NOTE — Patient Outreach (Signed)
  Care Coordination   07/27/2023 Name: FRIDAY BEGNOCHE MRN: [id number] DOB: 07-Sep-1960   Care Coordination Outreach Attempts:  A third unsuccessful outreach was attempted today to offer the patient with information about available care coordination services.  Follow Up Plan:  No further outreach attempts will be made at this time. We have been unable to contact the patient to offer or enroll patient in care coordination services  Encounter Outcome:  No Answer   Care Coordination Interventions:  No, not indicated    Reece Levy, MSW, LCSW [place], [facility] Licensed Clinical Social Worker Care Coordinator  [phone]

## 2023-07-27 NOTE — Patient Outreach (Signed)
  Care Coordination   07/27/2023 Name: Sherry Anderson MRN: [id number] DOB: 07-04-1961   Care Coordination Outreach Attempts:  A second unsuccessful outreach was attempted today to offer the patient with information about available care coordination services.  Follow Up Plan:  Additional outreach attempts will be made to offer the patient care coordination information and services.   Encounter Outcome:  No Answer   Care Coordination Interventions:  No, not indicated    Reece Levy, MSW, LCSW [facility], [facility] Licensed Clinical Social Worker Care Coordinator  [phone]

## 2023-08-05 ENCOUNTER — Telehealth: Payer: Self-pay | Admitting: *Deleted

## 2023-08-05 ENCOUNTER — Other Ambulatory Visit: Payer: Self-pay | Admitting: *Deleted

## 2023-08-05 DIAGNOSIS — Z982 Presence of cerebrospinal fluid drainage device: Secondary | ICD-10-CM | POA: Diagnosis not present

## 2023-08-05 DIAGNOSIS — K259 Gastric ulcer, unspecified as acute or chronic, without hemorrhage or perforation: Secondary | ICD-10-CM | POA: Diagnosis not present

## 2023-08-05 DIAGNOSIS — K219 Gastro-esophageal reflux disease without esophagitis: Secondary | ICD-10-CM | POA: Diagnosis not present

## 2023-08-05 DIAGNOSIS — Z86711 Personal history of pulmonary embolism: Secondary | ICD-10-CM | POA: Diagnosis not present

## 2023-08-05 DIAGNOSIS — Z86718 Personal history of other venous thrombosis and embolism: Secondary | ICD-10-CM | POA: Diagnosis not present

## 2023-08-05 DIAGNOSIS — K449 Diaphragmatic hernia without obstruction or gangrene: Secondary | ICD-10-CM | POA: Diagnosis not present

## 2023-08-05 NOTE — Patient Outreach (Signed)
  Care Management   Follow Up Note   08/05/2023 Name: Sherry Anderson MRN: [id number] DOB: [birth year]   Referred by: Latrelle Dodrill, MD Reason for referral : Care Management (RNCM: Attempt to Follow Up For Chronic Disease Management & Care Coordination Needs)   An unsuccessful telephone outreach was attempted today. The patient was referred to the case management team for assistance with care management and care coordination.   Follow Up Plan: The care management team will reach out to the patient again over the next 30 days.   Danise Edge, BSN RN RN Care Manager  [facility]  Direct Number: [phone]

## 2023-08-14 NOTE — Progress Notes (Addendum)
 COVID Vaccine received:  []  No [x]  Yes Date of any COVID positive Test in last 90 days:  none  PCP - Laymon Legions, MD [phone] (Work)  [phone] (Fax)  Cardiologist - Alm Clay, MD Neurosurgery- Garnette Bouquet, MD  [phone] (Work) [phone] (Fax)   Chest x-ray - 06-05-2023  1v  Epic EKG -  06-06-2023  Epic Stress Test -  ECHO - 04-28-2023  Epic Cardiac Cath -  CTA coronary calcium  score: 0  on 05-24-2023  PCR screen: []  Ordered & Completed []   No Order but Needs PROFEND     []   N/A for this surgery  Surgery Plan:  []  Ambulatory   [x]  Outpatient in bed  []  Admit Anesthesia:    [x]  General  []  Spinal  []   Choice []   MAC  Bowel Prep - [x]  No  []   Yes ____Clear liquids__  Pacemaker / ICD device [x]  No []  Yes   Spinal Cord Stimulator:[x]  No []  Yes       History of Sleep Apnea? []  No [x]  Yes  no study yet CPAP used?- []  No []  Yes    Does the patient monitor blood sugar?   []  N/A   []  No []  Yes  Patient has: []  NO Hx DM   [x]  Pre-DM   []  DM1  []   DM2 Last A1c was: 5.7  on   11-24-2021     Blood Thinner / Instructions: none Aspirin  Instructions:  none  ERAS Protocol Ordered: []  No  [x]  Yes  verbal order from Erminio 08-16-2023 PRE-SURGERY []  ENSURE  [x]  G2   Patient is to be NPO after: 0930   NO ORDERS just verbal  Dental hx: [x]  Dentures:  [x]  N/A      [x]  Bridge or Partial:                   [x]  Loose or Damaged teeth:   Comments: Has LEFT ARM RESTRICTION  Activity level: Patient is able to climb a flight of stairs without difficulty; [x]  No CP  [x]  No SOB.   Patient can  perform ADLs without assistance.   Anesthesia review: s/p craniotomy MVD- cerebral aneurysm, subsequent VP shunt in 01-[facility] at [facility], nonobs. CAD, systolic ejection murmur (ECHO 04-2023), HTN, anemia GERD, Hx DVT / PE, PRE-DM, CKD3 ,trigeminal neuralgia,  Right hand tremor, Fatty liver   Patient denies shortness of breath, fever and chest pain at PAT appointment. She does have a cough  that she has had x 2 weeks; getting better each day. She will call Erminio at Dr. Luigi office next Monday if she is not completely over the cough.   Patient verbalized understanding and agreement to the Pre-Surgical Instructions that were given to them at this PAT appointment. Patient was also educated of the need to review these PAT instructions again prior to her surgery.I reviewed the appropriate phone numbers to call if they have any and questions or concerns.

## 2023-08-14 NOTE — Patient Instructions (Addendum)
 SURGICAL WAITING ROOM VISITATION Patients having surgery or a procedure may have no more than 2 support people in the waiting area - these visitors may rotate in the visitor waiting room.   Due to an increase in RSV and influenza rates and associated hospitalizations, children ages 41 and under may not visit patients in [facility] hospitals. If the patient needs to stay at the hospital during part of their recovery, the visitor guidelines for inpatient rooms apply.  PRE-OP VISITATION  Pre-op nurse will coordinate an appropriate time for 1 support person to accompany the patient in pre-op.  This support person may not rotate.  This visitor will be contacted when the time is appropriate for the visitor to come back in the pre-op area.  Please refer to the [facility] website for the visitor guidelines for Inpatients (after your surgery is over and you are in a regular room).  You are not required to quarantine at this time prior to your surgery. However, you must do this: Hand Hygiene often Do NOT share personal items Notify your provider if you are in close contact with someone who has COVID or you develop fever 100.4 or greater, new onset of sneezing, cough, sore throat, shortness of breath or body aches.  If you test positive for Covid or have been in contact with anyone that has tested positive in the last 10 days please notify you surgeon.    Your procedure is scheduled on:   TUESDAY  August 30, 2023  Report to [facility] Main Entrance: [address] entrance where the [employer] is available.   Report to admitting at: 10:15     AM  Call this number if you have any questions or problems the morning of surgery [phone]  Do not eat food after Midnight the night prior to your surgery/procedure.  After Midnight you may have the following liquids until   0930  AM DAY OF SURGERY  Clear Liquid Diet Water Black Coffee (sugar ok, NO MILK/CREAM OR CREAMERS)  Tea (sugar ok, NO  MILK/CREAM OR CREAMERS) regular and decaf                             Plain Jell-O  with no fruit (NO RED)                                           Fruit ices (not with fruit pulp, NO RED)                                     Popsicles (NO RED)                                                                  Juice: NO CITRUS JUICES: only apple, WHITE grape, WHITE cranberry Sports drinks like Gatorade or Powerade (NO RED)                   The day of surgery:  Drink ONE (1) Pre-Surgery G2 at  0930  AM the  morning of surgery. Drink in one sitting. Do not sip.  This drink was given to you during your hospital pre-op appointment visit. Nothing else to drink after completing the Pre-Surgery G2 : No candy, chewing gum or throat lozenges.    FOLLOW ANY ADDITIONAL PRE OP INSTRUCTIONS YOU RECEIVED FROM YOUR SURGEON'S OFFICE!!!   Oral Hygiene is also important to reduce your risk of infection.        Remember - BRUSH YOUR TEETH THE MORNING OF SURGERY WITH YOUR REGULAR TOOTHPASTE  Do NOT smoke after Midnight the night before surgery.  STOP TAKING all Vitamins, Herbs and supplements 1 week before your surgery.   Take ONLY these medicines the morning of surgery with A SIP OF WATER: venlafaxine  (Effexor ), levothyroxine , pantoprazole , and You may take Tylenol  if needed for pain.                  You may not have any metal on your body including hair pins, jewelry, and body piercing  Do not wear make-up, lotions, powders, perfumes  or deodorant  Do not wear nail polish including gel and S&S, artificial / acrylic nails, or any other type of covering on natural nails including finger and toenails. If you have artificial nails, gel coating, etc., that needs to be removed by a nail salon, Please have this removed prior to surgery. Not doing so may mean that your surgery could be cancelled or delayed if the Surgeon or anesthesia staff feels like they are unable to monitor you safely.   Do not shave 48  hours prior to surgery to avoid nicks in your skin which may contribute to postoperative infections.   Contacts, Hearing Aids, dentures or bridgework may not be worn into surgery. DENTURES WILL BE REMOVED PRIOR TO SURGERY PLEASE DO NOT APPLY Poly grip OR ADHESIVES!!!  You may bring a small overnight bag with you on the day of surgery, only pack items that are not valuable. [facility] IS NOT RESPONSIBLE   FOR VALUABLES THAT ARE LOST OR STOLEN.   Do not bring your home medications to the hospital. The Pharmacy will dispense medications listed on your medication list to you during your admission in the Hospital.  Please read over the following fact sheets you were given: IF YOU HAVE QUESTIONS ABOUT YOUR PRE-OP INSTRUCTIONS, PLEASE CALL [phone]   [facility] - Preparing for Surgery Before surgery, you can play an important role.  Because skin is not sterile, your skin needs to be as free of germs as possible.  You can reduce the number of germs on your skin by washing with CHG (chlorahexidine gluconate) soap before surgery.  CHG is an antiseptic cleaner which kills germs and bonds with the skin to continue killing germs even after washing. Please DO NOT use if you have an allergy to CHG or antibacterial soaps.  If your skin becomes reddened/irritated stop using the CHG and inform your nurse when you arrive at Short Stay. Do not shave (including legs and underarms) for at least 48 hours prior to the first CHG shower.  You may shave your face/neck.  Please follow these instructions carefully:  1.  Shower with CHG Soap the night before surgery and the  morning of surgery.  2.  If you choose to wash your hair, wash your hair first as usual with your normal  shampoo.  3.  After you shampoo, rinse your hair and body thoroughly to remove the shampoo.  4.  Use CHG as you would any other liquid soap.  You can apply chg directly to the skin and wash.  Gently with a scrungie  or clean washcloth.  5.  Apply the CHG Soap to your body ONLY FROM THE NECK DOWN.   Do not use on face/ open                           Wound or open sores. Avoid contact with eyes, ears mouth and genitals (private parts).                       Wash face,  Genitals (private parts) with your normal soap.             6.  Wash thoroughly, paying special attention to the area where your  surgery  will be performed.  7.  Thoroughly rinse your body with warm water from the neck down.  8.  DO NOT shower/wash with your normal soap after using and rinsing off the CHG Soap.            9.  Pat yourself dry with a clean towel.            10.  Wear clean pajamas.            11.  Place clean sheets on your bed the night of your first shower and do not  sleep with pets.  ON THE DAY OF SURGERY : Do not apply any lotions/deodorants the morning of surgery.  Please wear clean clothes to the hospital/surgery center.     FAILURE TO FOLLOW THESE INSTRUCTIONS MAY RESULT IN THE CANCELLATION OF YOUR SURGERY  PATIENT SIGNATURE_________________________________  NURSE SIGNATURE__________________________________  ________________________________________________________________________

## 2023-08-16 ENCOUNTER — Encounter (HOSPITAL_COMMUNITY)
Admission: RE | Admit: 2023-08-16 | Discharge: 2023-08-16 | Disposition: A | Discharge status: Home / Self Care | Payer: Medicare HMO | Source: Ambulatory Visit | Attending: General Surgery | Admitting: General Surgery

## 2023-08-16 ENCOUNTER — Encounter (HOSPITAL_COMMUNITY): Payer: Self-pay

## 2023-08-16 ENCOUNTER — Other Ambulatory Visit: Payer: Self-pay

## 2023-08-16 VITALS — BP 103/64 | HR 78 | Temp 98.7°F | Resp 18 | Ht 62.0 in | Wt 168.0 lb

## 2023-08-16 DIAGNOSIS — K449 Diaphragmatic hernia without obstruction or gangrene: Secondary | ICD-10-CM | POA: Insufficient documentation

## 2023-08-16 DIAGNOSIS — R7303 Prediabetes: Secondary | ICD-10-CM | POA: Insufficient documentation

## 2023-08-16 DIAGNOSIS — Z86718 Personal history of other venous thrombosis and embolism: Secondary | ICD-10-CM | POA: Diagnosis not present

## 2023-08-16 DIAGNOSIS — Z982 Presence of cerebrospinal fluid drainage device: Secondary | ICD-10-CM | POA: Insufficient documentation

## 2023-08-16 DIAGNOSIS — Z01812 Encounter for preprocedural laboratory examination: Secondary | ICD-10-CM | POA: Diagnosis not present

## 2023-08-16 DIAGNOSIS — K219 Gastro-esophageal reflux disease without esophagitis: Secondary | ICD-10-CM | POA: Insufficient documentation

## 2023-08-16 DIAGNOSIS — Z01818 Encounter for other preprocedural examination: Secondary | ICD-10-CM

## 2023-08-16 DIAGNOSIS — Z853 Personal history of malignant neoplasm of breast: Secondary | ICD-10-CM | POA: Insufficient documentation

## 2023-08-16 DIAGNOSIS — K760 Fatty (change of) liver, not elsewhere classified: Secondary | ICD-10-CM | POA: Diagnosis not present

## 2023-08-16 DIAGNOSIS — I671 Cerebral aneurysm, nonruptured: Secondary | ICD-10-CM | POA: Diagnosis not present

## 2023-08-16 DIAGNOSIS — I10 Essential (primary) hypertension: Secondary | ICD-10-CM | POA: Insufficient documentation

## 2023-08-16 DIAGNOSIS — I517 Cardiomegaly: Secondary | ICD-10-CM | POA: Diagnosis not present

## 2023-08-16 DIAGNOSIS — K259 Gastric ulcer, unspecified as acute or chronic, without hemorrhage or perforation: Secondary | ICD-10-CM | POA: Diagnosis not present

## 2023-08-16 DIAGNOSIS — I2510 Atherosclerotic heart disease of native coronary artery without angina pectoris: Secondary | ICD-10-CM | POA: Diagnosis not present

## 2023-08-16 DIAGNOSIS — K7689 Other specified diseases of liver: Secondary | ICD-10-CM | POA: Insufficient documentation

## 2023-08-16 DIAGNOSIS — E039 Hypothyroidism, unspecified: Secondary | ICD-10-CM | POA: Diagnosis not present

## 2023-08-16 HISTORY — DX: Prediabetes: R73.03

## 2023-08-16 HISTORY — DX: Anemia, unspecified: D64.9

## 2023-08-16 HISTORY — DX: Cardiac murmur, unspecified: R01.1

## 2023-08-16 HISTORY — DX: Tremor, unspecified: R25.1

## 2023-08-16 HISTORY — DX: Unspecified osteoarthritis, unspecified site: M19.90

## 2023-08-16 HISTORY — DX: Personal history of other diseases of the digestive system: Z87.19

## 2023-08-16 LAB — COMPREHENSIVE METABOLIC PANEL
ALT: 29 U/L (ref 0–44)
AST: 24 U/L (ref 15–41)
Albumin: 4.1 g/dL (ref 3.5–5.0)
Alkaline Phosphatase: 62 U/L (ref 38–126)
Anion gap: 10 (ref 5–15)
BUN: 27 mg/dL — ABNORMAL HIGH (ref 8–23)
CO2: 23 mmol/L (ref 22–32)
Calcium: 9.3 mg/dL (ref 8.9–10.3)
Chloride: 103 mmol/L (ref 98–111)
Creatinine, Ser: 1.38 mg/dL — ABNORMAL HIGH (ref 0.44–1.00)
GFR, Estimated: 43 mL/min — ABNORMAL LOW (ref >60)
Glucose, Bld: 97 mg/dL (ref 70–99)
Potassium: 3.8 mmol/L (ref 3.5–5.1)
Sodium: 136 mmol/L (ref 135–145)
Total Bilirubin: 0.3 mg/dL (ref 0.0–1.2)
Total Protein: 8 g/dL (ref 6.5–8.1)

## 2023-08-16 LAB — CBC
HCT: 41.6 % (ref 36.0–46.0)
Hemoglobin: 13.6 g/dL (ref 12.0–15.0)
MCH: 28.6 pg (ref 26.0–34.0)
MCHC: 32.7 g/dL (ref 30.0–36.0)
MCV: 87.6 fL (ref 80.0–100.0)
Platelets: 314 10*3/uL (ref 150–400)
RBC: 4.75 MIL/uL (ref 3.87–5.11)
RDW: 13.7 % (ref 11.5–15.5)
WBC: 4.9 10*3/uL (ref 4.0–10.5)
nRBC: 0 % (ref 0.0–0.2)

## 2023-08-19 NOTE — Progress Notes (Addendum)
 Case: [id number] Date/Time: 08/30/23 1215   Procedures:      XI ROBOTIC ASSISTED HIATAL HERNIA REPAIR WITH POSSIBLE WRAP, POSSIBLE MESH     UPPER ENDOSCOPY   Anesthesia type: General   Pre-op diagnosis: LARGE HIATAL HERNIA GERD CAMERON'S ULCER   Location[facility] ROOM 02 / [facility]   Surgeons: Tanda Locus, MD       DISCUSSION: Sherry Anderson is a 63 yo female who presents to PAT prior to surgery above. PMH of HTN, trigeminal neuralgia s/p craniotomy c/b CSF leak s/p VP shunt placement (01/2020), cerebral aneurysm, heart murmur, hx of DVT, suspected OSA, GERD, hiatal hernia, pre-diabetes, hypothyroidism, hx of breast cancer s/p left lumpectomy, hx of cervical cancer s/p total hysterectomy (2019), lumbar DDD s/p L4-S1 fusion (2019).  Patient follows with Neurosurgery due to trigeminal neuralgia and hx of cerebral aneurysm. She was referred to NSU for surgical tx of trigeminal neuralgia in 2021. She underwent craniotomy and microvascular decompression on 01/25/2020. During the work up, a 4mm saccular aneurysm was incidentally noted at the left MCA bifurcation. Post op course complicated by development of a CSF leak and she underwent VP shunt placement on 02/12/2020. She developed a DVT in 03/2020 and was started on Eliquis  (course completed). She was last seen by NSU on 09/21/22. A CTA head was ordered at that time to follow up on the MCA aneurysm which was done on 09/29/22 and showed it was stable.  Patient was seen by Cardiology on 05/12/23 for evaluation of a heart murmur, CP/SOB, daytime sleepiness. She underwent CTA coronary which showed calcium  score of 0 with mild nonobstructive CAD in the RCA. Echo was done which showed normal EF, grade I DD, mild MR. Work up for sleep apnea was recommended if ischemic w/u was unremarkable but has not been completed yet.  Patient was admitted for observation for syncope from 06/05/23-06/06/23. She was seen by Cardiology inpatient who recommended supportive care for  likely vasovagal syncope. She was given iron  infusion for anemia. She followed up with GI and had an EGD on 07/12/23 which showed a large hiatal hernia with Ole lesions. She is on a PPI and iron . She was referred for surgery. Hgb has improved and she is no longer anemic.  PAT labs remarkable for mild AKI. Discussed with patient. She states she has had a mild cough but overall feels well and has been eating and drinking well. She denies taking any over the counter medicines. She was advised to drink plenty of fluids and avoid NSAIDs. Results to PCP. Will recheck BMP DOS.  VS: BP 103/64 Comment: right arm sitting  Pulse 78   Temp 37.1 C (Oral)   Resp 18   Ht 5' 2 (1.575 m)   Wt 76.2 kg   LMP  (LMP Unknown) Comment: not sexually active  SpO2 100%   BMI 30.73 kg/m   PROVIDERS: PCP: Donah Laymon PARAS, MD Cardiology: Alm Clay, MD GI: Lupita Commander, MD NSU: Garnette Bouquet, MD  LABS: Labs reviewed: Acceptable for surgery. Mild AKI noted (baseline kidney function .9) (all labs ordered are listed, but only abnormal results are displayed)  Labs Reviewed  COMPREHENSIVE METABOLIC PANEL - Abnormal; Notable for the following components:      Result Value   BUN 27 (*)    Creatinine, Ser 1.38 (*)    GFR, Estimated 43 (*)    All other components within normal limits  CBC     IMAGES:  CTA head 09/29/22 ([facility]):  Impression  1.  Similar size of a saccular aneurysm of the left MCA bifurcation measuring up to 4 mm in size. 2.  A 1 to 2 mm anterior communicating artery aneurysm is also similar to prior. 3.  No acute intracranial abnormality identified. 4.  Similar positioning of a left parietal approach ventricular shunt catheter. The ventricular size is similar to prior imaging.  CT Chest 05/24/23:  IMPRESSION: Large paraesophageal hiatal hernia.  Hepatic cyst.   EKG:   CV:  CTA coronary 05/24/23:  IMPRESSION: 1. Coronary calcium  score of 0. This was 0 percentile for  age-, sex, and race-matched controls.   2. Total plaque volume 130 mm3 which is 56th percentile for age- and sex-matched controls (calcified plaque 53mm3; non-calcified plaque 151mm3). TPV is mild.   3. Normal coronary origin with right dominance.   4. Mild atherosclerosis.  25-49% soft plaque in the proximal RCA.   5. Recommend preventive therapy and risk factor modification.   6. Consider non atherosclerotic causes of chest pain.   Echo 04/28/23:  IMPRESSIONS    1. Left ventricular ejection fraction, by estimation, is 70 to 75%. Left ventricular ejection fraction by 3D volume is 72 %. The left ventricle has hyperdynamic function. The left ventricle has no regional wall motion abnormalities. There is mild left ventricular hypertrophy. Left ventricular diastolic parameters are consistent with Grade I diastolic dysfunction (impaired relaxation). The average left ventricular global longitudinal strain is -19.7 %. The global longitudinal strain is normal.  2. Right ventricular systolic function is normal. The right ventricular size is normal.  3. Left atrial size was moderately dilated.  4. The mitral valve is grossly normal. Mild mitral valve regurgitation. No evidence of mitral stenosis.  5. The aortic valve is normal in structure. Aortic valve regurgitation is not visualized. No aortic stenosis is present.  Comparison(s): No prior Echocardiogram. 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   Associated with high-grade dysplasia   Anemia    Anxiety and depression    Arthritis    Breast cancer (HCC) 2002   left breast cancer    Cerebral aneurysm 2021   had craniotomy MVD at [facility]   Depression    Diverticulosis of colon    DVT (deep venous thrombosis) (HCC) 2021   provoked by surgery   Essential hypertension, benign    Family history of pancreatic cancer    GERD (gastroesophageal reflux disease)    Heart murmur     systolic ejection murmur   had ECHO   Hepatitis    unsure of which type   High grade squamous intraepithelial cervical dysplasia 05/2017   Associated with adenocarcinoma in situ   History of adenomatous polyp of colon    tubular adenoma's  02-08-2017   History of gastritis    History of hiatal hernia    History of left breast cancer 2002---- per pt no recurrence   dx DCIS --- s/p  left breast lumpectomy;  per pt radiation therapy completed same year and completed antiestrogen therapy    History of radiation therapy    2002   left breast   History of trigeminal neuralgia    Hypothyroidism    Internal hemorrhoids    Pain    right ear and jaw   Personal history of radiation therapy    Pre-diabetes    Spinal stenosis    Spondylolisthesis    SVD (spontaneous vaginal delivery)    x 1   Tremor of both  hands    Tubular adenoma of colon     Past Surgical History:  Procedure Laterality Date   BRAIN SURGERY  01/25/2020   craniotomy MVD at [facility]   BREAST LUMPECTOMY Left 2002   ductal CA in situ   CERVICAL CONIZATION W/BX N/A 06/08/2017   Procedure: CONIZATION CERVIX WITH BIOPSY;  Surgeon: Rockney Evalene SQUIBB, MD;  Location: [facility];  Service: Gynecology;  Laterality: N/A;   CESAREAN SECTION  1990   twins   COLONOSCOPY  02-08-2017   dr gustav   CRANIECTOMY  02/12/2020   VP shunt following craniotomy   DILATATION & CURETTAGE/HYSTEROSCOPY WITH MYOSURE N/A 06/08/2017   Procedure: DILATATION & CURETTAGE/HYSTEROSCOPY WITH MYOSURE;  Surgeon: Rockney Evalene SQUIBB, MD;  Location: [facility];  Service: Gynecology;  Laterality: N/A;   ESOPHAGOGASTRODUODENOSCOPY  last one 11-24-2016   dr gustav   ROBOTIC ASSISTED TOTAL HYSTERECTOMY WITH BILATERAL SALPINGO OOPHERECTOMY N/A 09/21/2017   Procedure: XI ROBOTIC ASSISTED TOTAL HYSTERECTOMY WITH BILATERAL SALPINGO OOPHORECTOMY, Lysis of Adhesions;  Surgeon: Lavoie, Marie-Lyne, MD;  Location: [facility];  Service:  Gynecology;  Laterality: N/A;   TOOTH EXTRACTION     TRANSFORAMINAL LUMBAR INTERBODY FUSION (TLIF) WITH PEDICLE SCREW FIXATION 2 LEVEL N/A 04/13/2018   Procedure: RIGHT-SIDED LUMBAR 4-5 AND LUMBAR 5-SACRUM 1 TRANSFORAMINAL LUMBAR INTERBODY FUSION WITH INSTRUMENTATION AND ALLOGRAFT;  Surgeon: Beuford Anes, MD;  Location: [facility];  Service: Orthopedics;  Laterality: N/A;   WISDOM TOOTH EXTRACTION      MEDICATIONS:  acetaminophen  (TYLENOL ) 325 MG tablet   Calcium  Citrate-Vitamin D  (CALCIUM  + D PO)   Carboxymethylcellulose Sodium (THERATEARS) 0.25 % SOLN   Cyanocobalamin  (B-12 PO)   ferrous sulfate  325 (65 FE) MG tablet   levothyroxine  (SYNTHROID ) 75 MCG tablet   lisinopril  (ZESTRIL ) 20 MG tablet   pantoprazole  (PROTONIX ) 40 MG tablet   Probiotic Product (PROBIOTIC PO)   QUEtiapine  (SEROQUEL ) 50 MG tablet   triamterene -hydrochlorothiazide  (DYAZIDE ) 37.5-25 MG capsule   venlafaxine  XR (EFFEXOR -XR) 75 MG 24 hr capsule    0.9 %  sodium chloride  infusion   Burnard CHRISTELLA Odis DEVONNA [facility] Short Stay/Anesthesiology [facility] Phone [phone] 08/19/2023 1:24 PM

## 2023-08-19 NOTE — Anesthesia Preprocedure Evaluation (Addendum)
 Anesthesia Evaluation  Patient identified by MRN, date of birth, ID band Patient awake    Reviewed: Allergy & Precautions, NPO status , Patient's Chart, lab work & pertinent test results  Airway Mallampati: II  TM Distance: >3 FB Neck ROM: Full    Dental no notable dental hx.    Pulmonary shortness of breath   Pulmonary exam normal        Cardiovascular hypertension, + DVT   Rhythm:Regular Rate:Normal     Neuro/Psych  Headaches  Anxiety Depression       GI/Hepatic hiatal hernia, PUD,GERD  Medicated,,  Endo/Other  Hypothyroidism    Renal/GU negative Renal ROS  negative genitourinary   Musculoskeletal  (+) Arthritis , Osteoarthritis,    Abdominal Normal abdominal exam  (+)   Peds  Hematology  (+) Blood dyscrasia, anemia   Anesthesia Other Findings   Reproductive/Obstetrics                             Anesthesia Physical Anesthesia Plan  ASA: 3  Anesthesia Plan: General   Post-op Pain Management: Tylenol  PO (pre-op)*   Induction: Intravenous  PONV Risk Score and Plan: 3 and Ondansetron , Dexamethasone  and Treatment may vary due to age or medical condition  Airway Management Planned: Mask and Oral ETT  Additional Equipment: None  Intra-op Plan:   Post-operative Plan: Extubation in OR  Informed Consent: I have reviewed the patients History and Physical, chart, labs and discussed the procedure including the risks, benefits and alternatives for the proposed anesthesia with the patient or authorized representative who has indicated his/her understanding and acceptance.     Dental advisory given  Plan Discussed with: CRNA  Anesthesia Plan Comments: (See PAT note from 12/31 by MARLA Senna PA-C )        Anesthesia Quick Evaluation

## 2023-08-30 ENCOUNTER — Ambulatory Visit (HOSPITAL_COMMUNITY): Payer: Medicare HMO | Admitting: Physician Assistant

## 2023-08-30 ENCOUNTER — Ambulatory Visit: Payer: Self-pay | Admitting: General Surgery

## 2023-08-30 ENCOUNTER — Ambulatory Visit (HOSPITAL_BASED_OUTPATIENT_CLINIC_OR_DEPARTMENT_OTHER): Payer: Medicare HMO | Admitting: Medical

## 2023-08-30 ENCOUNTER — Ambulatory Visit (HOSPITAL_COMMUNITY)
Admission: RE | Admit: 2023-08-30 | Discharge: 2023-08-31 | Disposition: A | Discharge status: Home / Self Care | Payer: Medicare HMO | Source: Ambulatory Visit | Attending: General Surgery | Admitting: General Surgery

## 2023-08-30 ENCOUNTER — Encounter (HOSPITAL_COMMUNITY)
Admission: RE | Disposition: A | Discharge status: Home / Self Care | Payer: Self-pay | Source: Ambulatory Visit | Attending: General Surgery

## 2023-08-30 ENCOUNTER — Encounter (HOSPITAL_COMMUNITY): Payer: Self-pay | Admitting: General Surgery

## 2023-08-30 ENCOUNTER — Other Ambulatory Visit: Payer: Self-pay

## 2023-08-30 DIAGNOSIS — E785 Hyperlipidemia, unspecified: Secondary | ICD-10-CM | POA: Diagnosis not present

## 2023-08-30 DIAGNOSIS — Z79890 Hormone replacement therapy (postmenopausal): Secondary | ICD-10-CM | POA: Diagnosis not present

## 2023-08-30 DIAGNOSIS — I10 Essential (primary) hypertension: Secondary | ICD-10-CM | POA: Insufficient documentation

## 2023-08-30 DIAGNOSIS — Z86718 Personal history of other venous thrombosis and embolism: Secondary | ICD-10-CM | POA: Diagnosis not present

## 2023-08-30 DIAGNOSIS — K449 Diaphragmatic hernia without obstruction or gangrene: Secondary | ICD-10-CM

## 2023-08-30 DIAGNOSIS — Z79899 Other long term (current) drug therapy: Secondary | ICD-10-CM | POA: Diagnosis not present

## 2023-08-30 DIAGNOSIS — K219 Gastro-esophageal reflux disease without esophagitis: Secondary | ICD-10-CM

## 2023-08-30 DIAGNOSIS — E039 Hypothyroidism, unspecified: Secondary | ICD-10-CM

## 2023-08-30 DIAGNOSIS — D509 Iron deficiency anemia, unspecified: Secondary | ICD-10-CM | POA: Diagnosis not present

## 2023-08-30 DIAGNOSIS — N179 Acute kidney failure, unspecified: Secondary | ICD-10-CM

## 2023-08-30 DIAGNOSIS — Z8719 Personal history of other diseases of the digestive system: Secondary | ICD-10-CM

## 2023-08-30 HISTORY — PX: UPPER GI ENDOSCOPY: SHX6162

## 2023-08-30 HISTORY — PX: XI ROBOTIC ASSISTED HIATAL HERNIA REPAIR: SHX6889

## 2023-08-30 LAB — BASIC METABOLIC PANEL
Anion gap: 6 (ref 5–15)
BUN: 16 mg/dL (ref 8–23)
CO2: 23 mmol/L (ref 22–32)
Calcium: 8.6 mg/dL — ABNORMAL LOW (ref 8.9–10.3)
Chloride: 102 mmol/L (ref 98–111)
Creatinine, Ser: 0.99 mg/dL (ref 0.44–1.00)
GFR, Estimated: >60 mL/min (ref >60)
Glucose, Bld: 98 mg/dL (ref 70–99)
Potassium: 3.1 mmol/L — ABNORMAL LOW (ref 3.5–5.1)
Sodium: 131 mmol/L — ABNORMAL LOW (ref 135–145)

## 2023-08-30 SURGERY — REPAIR, HERNIA, HIATAL, ROBOT-ASSISTED
Anesthesia: General

## 2023-08-30 MED ORDER — FENTANYL CITRATE (PF) 250 MCG/5ML IJ SOLN
INTRAMUSCULAR | Status: DC | PRN
  Administered 2023-08-30 (×2): 50 ug via INTRAVENOUS
  Administered 2023-08-30: 100 ug via INTRAVENOUS

## 2023-08-30 MED ORDER — TRIAMTERENE-HCTZ 37.5-25 MG PO CAPS
1.0000 | ORAL_CAPSULE | Freq: Every day | ORAL | Status: DC
  Administered 2023-08-31: 1 via ORAL
  Filled 2023-08-30 (×2): qty 1

## 2023-08-30 MED ORDER — LACTATED RINGERS IV SOLN: INTRAVENOUS | Status: DC

## 2023-08-30 MED ORDER — CHLORHEXIDINE GLUCONATE 0.12 % MT SOLN
15.0000 mL | Freq: Once | OROMUCOSAL | Status: AC
  Administered 2023-08-30: 15 mL via OROMUCOSAL

## 2023-08-30 MED ORDER — DEXAMETHASONE SODIUM PHOSPHATE 4 MG/ML IJ SOLN: 4.0000 mg | INTRAMUSCULAR | Status: DC

## 2023-08-30 MED ORDER — CHLORHEXIDINE GLUCONATE CLOTH 2 % EX PADS: 6.0000 | MEDICATED_PAD | Freq: Once | CUTANEOUS | Status: DC

## 2023-08-30 MED ORDER — PROPOFOL 10 MG/ML IV BOLUS
INTRAVENOUS | Status: DC | PRN
  Administered 2023-08-30: 140 mg via INTRAVENOUS

## 2023-08-30 MED ORDER — ONDANSETRON HCL 4 MG/2ML IJ SOLN: 4.0000 mg | Freq: Four times a day (QID) | INTRAMUSCULAR | Status: DC | PRN

## 2023-08-30 MED ORDER — ALBUMIN HUMAN 5 % IV SOLN: INTRAVENOUS | Status: DC | PRN

## 2023-08-30 MED ORDER — BUPIVACAINE LIPOSOME 1.3 % IJ SUSP
INTRAMUSCULAR | Status: AC
  Filled 2023-08-30: qty 20

## 2023-08-30 MED ORDER — PHENYLEPHRINE 80 MCG/ML (10ML) SYRINGE FOR IV PUSH (FOR BLOOD PRESSURE SUPPORT)
PREFILLED_SYRINGE | INTRAVENOUS | Status: DC | PRN
Start: 2023-08-30 — End: 2023-08-30
  Administered 2023-08-30 (×5): 160 ug via INTRAVENOUS

## 2023-08-30 MED ORDER — VENLAFAXINE HCL ER 75 MG PO CP24
75.0000 mg | ORAL_CAPSULE | Freq: Every day | ORAL | Status: DC
  Administered 2023-08-31: 75 mg via ORAL
  Filled 2023-08-30: qty 1

## 2023-08-30 MED ORDER — FENTANYL CITRATE PF 50 MCG/ML IJ SOSY
25.0000 ug | PREFILLED_SYRINGE | INTRAMUSCULAR | Status: DC | PRN
  Administered 2023-08-30: 50 ug via INTRAVENOUS

## 2023-08-30 MED ORDER — DIPHENHYDRAMINE HCL 50 MG/ML IJ SOLN: 12.5000 mg | Freq: Four times a day (QID) | INTRAMUSCULAR | Status: DC | PRN

## 2023-08-30 MED ORDER — EPHEDRINE SULFATE-NACL 50-0.9 MG/10ML-% IV SOSY
PREFILLED_SYRINGE | INTRAVENOUS | Status: DC | PRN
  Administered 2023-08-30: 5 mg via INTRAVENOUS
  Administered 2023-08-30: 10 mg via INTRAVENOUS

## 2023-08-30 MED ORDER — FENTANYL CITRATE (PF) 250 MCG/5ML IJ SOLN
INTRAMUSCULAR | Status: AC
  Filled 2023-08-30: qty 5

## 2023-08-30 MED ORDER — METOPROLOL TARTRATE 5 MG/5ML IV SOLN: 5.0000 mg | Freq: Four times a day (QID) | INTRAVENOUS | Status: DC | PRN

## 2023-08-30 MED ORDER — ALBUMIN HUMAN 5 % IV SOLN
INTRAVENOUS | Status: AC
  Filled 2023-08-30: qty 250

## 2023-08-30 MED ORDER — FENTANYL CITRATE PF 50 MCG/ML IJ SOSY
PREFILLED_SYRINGE | INTRAMUSCULAR | Status: AC
  Filled 2023-08-30: qty 1

## 2023-08-30 MED ORDER — MIDAZOLAM HCL 2 MG/2ML IJ SOLN
INTRAMUSCULAR | Status: AC
Start: 2023-08-30 — End: ?
  Filled 2023-08-30: qty 2

## 2023-08-30 MED ORDER — ROCURONIUM BROMIDE 10 MG/ML (PF) SYRINGE
PREFILLED_SYRINGE | INTRAVENOUS | Status: DC | PRN
  Administered 2023-08-30: 20 mg via INTRAVENOUS
  Administered 2023-08-30: 10 mg via INTRAVENOUS
  Administered 2023-08-30: 50 mg via INTRAVENOUS

## 2023-08-30 MED ORDER — DEXAMETHASONE SODIUM PHOSPHATE 10 MG/ML IJ SOLN
INTRAMUSCULAR | Status: DC | PRN
  Administered 2023-08-30: 10 mg via INTRAVENOUS

## 2023-08-30 MED ORDER — MORPHINE SULFATE (PF) 2 MG/ML IV SOLN: 1.0000 mg | INTRAVENOUS | Status: DC | PRN

## 2023-08-30 MED ORDER — CEFAZOLIN SODIUM-DEXTROSE 2-4 GM/100ML-% IV SOLN
2.0000 g | INTRAVENOUS | Status: AC
  Administered 2023-08-30: 2 g via INTRAVENOUS
  Filled 2023-08-30: qty 100

## 2023-08-30 MED ORDER — HEPARIN SODIUM (PORCINE) 5000 UNIT/ML IJ SOLN
5000.0000 [IU] | Freq: Once | INTRAMUSCULAR | Status: AC
  Administered 2023-08-30: 5000 [IU] via SUBCUTANEOUS
  Filled 2023-08-30: qty 1

## 2023-08-30 MED ORDER — LISINOPRIL 20 MG PO TABS
20.0000 mg | ORAL_TABLET | Freq: Every day | ORAL | Status: DC
  Administered 2023-08-30: 20 mg via ORAL
  Filled 2023-08-30: qty 1

## 2023-08-30 MED ORDER — DEXAMETHASONE SODIUM PHOSPHATE 10 MG/ML IJ SOLN
INTRAMUSCULAR | Status: AC
  Filled 2023-08-30: qty 1

## 2023-08-30 MED ORDER — SCOPOLAMINE 1 MG/3DAYS TD PT72
1.0000 | MEDICATED_PATCH | TRANSDERMAL | Status: DC
  Administered 2023-08-30: 1.5 mg via TRANSDERMAL
  Filled 2023-08-30: qty 1

## 2023-08-30 MED ORDER — TRAMADOL HCL 50 MG PO TABS
50.0000 mg | ORAL_TABLET | Freq: Four times a day (QID) | ORAL | Status: DC | PRN
  Administered 2023-08-30 – 2023-08-31 (×2): 50 mg via ORAL
  Filled 2023-08-30 (×2): qty 1

## 2023-08-30 MED ORDER — ENOXAPARIN SODIUM 40 MG/0.4ML IJ SOSY
40.0000 mg | PREFILLED_SYRINGE | INTRAMUSCULAR | Status: DC
  Administered 2023-08-31: 40 mg via SUBCUTANEOUS
  Filled 2023-08-30: qty 0.4

## 2023-08-30 MED ORDER — EPHEDRINE 5 MG/ML INJ
INTRAVENOUS | Status: AC
  Filled 2023-08-30: qty 5

## 2023-08-30 MED ORDER — SUGAMMADEX SODIUM 200 MG/2ML IV SOLN
INTRAVENOUS | Status: DC | PRN
  Administered 2023-08-30: 200 mg via INTRAVENOUS

## 2023-08-30 MED ORDER — BUPIVACAINE-EPINEPHRINE 0.25% -1:200000 IJ SOLN
INTRAMUSCULAR | Status: AC
  Filled 2023-08-30: qty 1

## 2023-08-30 MED ORDER — BUPIVACAINE-EPINEPHRINE 0.25% -1:200000 IJ SOLN
INTRAMUSCULAR | Status: DC | PRN
  Administered 2023-08-30: 30 mL

## 2023-08-30 MED ORDER — ONDANSETRON HCL 4 MG/2ML IJ SOLN
INTRAMUSCULAR | Status: DC | PRN
  Administered 2023-08-30: 4 mg via INTRAVENOUS

## 2023-08-30 MED ORDER — BUPIVACAINE LIPOSOME 1.3 % IJ SUSP
INTRAMUSCULAR | Status: DC | PRN
  Administered 2023-08-30: 20 mL

## 2023-08-30 MED ORDER — ACETAMINOPHEN 500 MG PO TABS
1000.0000 mg | ORAL_TABLET | Freq: Four times a day (QID) | ORAL | Status: DC
  Administered 2023-08-31 (×2): 1000 mg via ORAL
  Filled 2023-08-30: qty 2

## 2023-08-30 MED ORDER — PANTOPRAZOLE SODIUM 40 MG IV SOLR
40.0000 mg | Freq: Every day | INTRAVENOUS | Status: DC
  Administered 2023-08-30: 40 mg via INTRAVENOUS
  Filled 2023-08-30: qty 10

## 2023-08-30 MED ORDER — PROPOFOL 10 MG/ML IV BOLUS
INTRAVENOUS | Status: AC
  Filled 2023-08-30: qty 20

## 2023-08-30 MED ORDER — BUPIVACAINE LIPOSOME 1.3 % IJ SUSP: 20.0000 mL | Freq: Once | INTRAMUSCULAR | Status: DC

## 2023-08-30 MED ORDER — LEVOTHYROXINE SODIUM 75 MCG PO TABS
75.0000 ug | ORAL_TABLET | ORAL | Status: DC
  Administered 2023-08-31: 75 ug via ORAL
  Filled 2023-08-30: qty 1

## 2023-08-30 MED ORDER — ACETAMINOPHEN 500 MG PO TABS
1000.0000 mg | ORAL_TABLET | ORAL | Status: AC
  Administered 2023-08-30: 1000 mg via ORAL
  Filled 2023-08-30: qty 2

## 2023-08-30 MED ORDER — ONDANSETRON HCL 4 MG/2ML IJ SOLN
INTRAMUSCULAR | Status: AC
  Filled 2023-08-30: qty 2

## 2023-08-30 MED ORDER — ORAL CARE MOUTH RINSE: 15.0000 mL | Freq: Once | OROMUCOSAL | Status: AC

## 2023-08-30 MED ORDER — KETOROLAC TROMETHAMINE 15 MG/ML IJ SOLN
15.0000 mg | Freq: Three times a day (TID) | INTRAMUSCULAR | Status: DC | PRN
  Administered 2023-08-30: 15 mg via INTRAVENOUS
  Filled 2023-08-30: qty 1

## 2023-08-30 MED ORDER — DIPHENHYDRAMINE HCL 12.5 MG/5ML PO ELIX: 12.5000 mg | ORAL_SOLUTION | Freq: Four times a day (QID) | ORAL | Status: DC | PRN

## 2023-08-30 MED ORDER — QUETIAPINE FUMARATE 50 MG PO TABS
50.0000 mg | ORAL_TABLET | Freq: Every day | ORAL | Status: DC
  Administered 2023-08-30: 50 mg via ORAL
  Filled 2023-08-30: qty 1

## 2023-08-30 MED ORDER — PHENYLEPHRINE 80 MCG/ML (10ML) SYRINGE FOR IV PUSH (FOR BLOOD PRESSURE SUPPORT)
PREFILLED_SYRINGE | INTRAVENOUS | Status: AC
  Filled 2023-08-30: qty 10

## 2023-08-30 MED ORDER — ROCURONIUM BROMIDE 10 MG/ML (PF) SYRINGE
PREFILLED_SYRINGE | INTRAVENOUS | Status: AC
  Filled 2023-08-30: qty 10

## 2023-08-30 MED ORDER — ONDANSETRON 4 MG PO TBDP: 4.0000 mg | ORAL_TABLET | Freq: Four times a day (QID) | ORAL | Status: DC | PRN

## 2023-08-30 MED ORDER — LIDOCAINE HCL (PF) 2 % IJ SOLN
INTRAMUSCULAR | Status: AC
  Filled 2023-08-30: qty 5

## 2023-08-30 MED ORDER — LACTATED RINGERS IR SOLN
Status: DC | PRN
  Administered 2023-08-30: 1000 mL

## 2023-08-30 MED ORDER — SODIUM CHLORIDE 0.9 % IV SOLN: 12.5000 mg | Freq: Four times a day (QID) | INTRAVENOUS | Status: DC | PRN

## 2023-08-30 MED ORDER — SIMETHICONE 80 MG PO CHEW: 40.0000 mg | CHEWABLE_TABLET | Freq: Four times a day (QID) | ORAL | Status: DC | PRN

## 2023-08-30 MED ORDER — MIDAZOLAM HCL 2 MG/2ML IJ SOLN
INTRAMUSCULAR | Status: DC | PRN
  Administered 2023-08-30: 2 mg via INTRAVENOUS

## 2023-08-30 MED ORDER — KCL IN DEXTROSE-NACL 20-5-0.45 MEQ/L-%-% IV SOLN
INTRAVENOUS | Status: DC
  Filled 2023-08-30 (×2): qty 1000

## 2023-08-30 MED ORDER — LIDOCAINE 2% (20 MG/ML) 5 ML SYRINGE
INTRAMUSCULAR | Status: DC | PRN
  Administered 2023-08-30: 1.5 mg/kg/h via INTRAVENOUS
  Administered 2023-08-30: 60 mg via INTRAVENOUS

## 2023-08-30 SURGICAL SUPPLY — 65 items
ANTIFOG SOL W/FOAM PAD STRL (MISCELLANEOUS) ×1
APPLIER CLIP 5 13 M/L LIGAMAX5 (MISCELLANEOUS)
APPLIER CLIP ROT 10 11.4 M/L (STAPLE)
BLADE SURG SZ11 CARB STEEL (BLADE) ×2 IMPLANT
CHLORAPREP W/TINT 26 (MISCELLANEOUS) ×2 IMPLANT
CLIP APPLIE 5 13 M/L LIGAMAX5 (MISCELLANEOUS) IMPLANT
CLIP APPLIE ROT 10 11.4 M/L (STAPLE) IMPLANT
COVER SURGICAL LIGHT HANDLE (MISCELLANEOUS) ×2 IMPLANT
COVER TIP SHEARS 8 DVNC (MISCELLANEOUS) IMPLANT
DERMABOND ADVANCED .7 DNX12 (GAUZE/BANDAGES/DRESSINGS) IMPLANT
DRAIN PENROSE 0.5X18 (DRAIN) IMPLANT
DRAPE ARM DVNC X/XI (DISPOSABLE) ×8 IMPLANT
DRAPE COLUMN DVNC XI (DISPOSABLE) ×2 IMPLANT
DRIVER NDL LRG 8 DVNC XI (INSTRUMENTS) ×2 IMPLANT
DRIVER NDL MEGA SUTCUT DVNCXI (INSTRUMENTS) ×2 IMPLANT
DRIVER NDLE LRG 8 DVNC XI (INSTRUMENTS) ×1
DRIVER NDLE MEGA SUTCUT DVNCXI (INSTRUMENTS) ×1
DRSG TEGADERM 2-3/8X2-3/4 SM (GAUZE/BANDAGES/DRESSINGS) ×12 IMPLANT
ELECT REM PT RETURN 15FT ADLT (MISCELLANEOUS) ×2 IMPLANT
FORCEPS CADIERE DVNC XI (FORCEP) IMPLANT
GAUZE 4X4 16PLY ~~LOC~~+RFID DBL (SPONGE) ×2 IMPLANT
GAUZE SPONGE 2X2 8PLY STRL LF (GAUZE/BANDAGES/DRESSINGS) ×2 IMPLANT
GLOVE BIO SURGEON STRL SZ7.5 (GLOVE) ×4 IMPLANT
GLOVE INDICATOR 8.0 STRL GRN (GLOVE) ×4 IMPLANT
GOWN STRL REUS W/ TWL XL LVL3 (GOWN DISPOSABLE) IMPLANT
GRASPER SUT TROCAR 14GX15 (MISCELLANEOUS) IMPLANT
GRASPER TIP-UP FEN DVNC XI (INSTRUMENTS) ×2 IMPLANT
IRRIG SUCT STRYKERFLOW 2 WTIP (MISCELLANEOUS) ×1
IRRIGATION SUCT STRKRFLW 2 WTP (MISCELLANEOUS) ×2 IMPLANT
KIT BASIN OR (CUSTOM PROCEDURE TRAY) ×2 IMPLANT
KIT GASTRIC LAVAGE 34FR ADT (SET/KITS/TRAYS/PACK) IMPLANT
KIT TURNOVER KIT A (KITS) IMPLANT
LUBRICANT JELLY K Y 4OZ (MISCELLANEOUS) IMPLANT
MARKER SKIN DUAL TIP RULER LAB (MISCELLANEOUS) IMPLANT
NDL HYPO 22X1.5 SAFETY MO (MISCELLANEOUS) ×2 IMPLANT
NEEDLE HYPO 22X1.5 SAFETY MO (MISCELLANEOUS) ×1
OBTURATOR OPTICAL STND 8 DVNC (TROCAR) ×1
OBTURATOR OPTICALSTD 8 DVNC (TROCAR) ×2 IMPLANT
PACK CARDIOVASCULAR III (CUSTOM PROCEDURE TRAY) ×2 IMPLANT
PAD POSITIONING PINK XL (MISCELLANEOUS) ×2 IMPLANT
SCISSORS LAP 5X35 DISP (ENDOMECHANICALS) IMPLANT
SCISSORS MNPLR CVD DVNC XI (INSTRUMENTS) ×2 IMPLANT
SEAL UNIV 5-12 XI (MISCELLANEOUS) ×6 IMPLANT
SEALER VESSEL EXT DVNC XI (MISCELLANEOUS) ×2 IMPLANT
SOL ELECTROSURG ANTI STICK (MISCELLANEOUS) ×1
SOLUTION ANTFG W/FOAM PAD STRL (MISCELLANEOUS) ×2 IMPLANT
SOLUTION ELECTROSURG ANTI STCK (MISCELLANEOUS) ×2 IMPLANT
SPIKE FLUID TRANSFER (MISCELLANEOUS) ×2 IMPLANT
STRIP CLOSURE SKIN 1/2X4 (GAUZE/BANDAGES/DRESSINGS) ×2 IMPLANT
SUT ETHIBOND 0 36 GRN (SUTURE) ×6 IMPLANT
SUT MNCRL AB 4-0 PS2 18 (SUTURE) ×2 IMPLANT
SUT SILK 0 SH 30 (SUTURE) IMPLANT
SUT SILK 2 0 SH (SUTURE) IMPLANT
SUT VIC AB 0 UR5 27 (SUTURE) IMPLANT
SYR 20ML ECCENTRIC (SYRINGE) ×2 IMPLANT
SYR 20ML LL LF (SYRINGE) ×2 IMPLANT
TIP INNERVISION DETACH 40FR (MISCELLANEOUS) IMPLANT
TIP INNERVISION DETACH 50FR (MISCELLANEOUS) IMPLANT
TIP INNERVISION DETACH 56FR (MISCELLANEOUS) IMPLANT
TOWEL OR 17X26 10 PK STRL BLUE (TOWEL DISPOSABLE) ×2 IMPLANT
TRAY FOLEY MTR SLVR 16FR STAT (SET/KITS/TRAYS/PACK) IMPLANT
TROCAR ADV FIXATION 12X100MM (TROCAR) IMPLANT
TROCAR ADV FIXATION 5X100MM (TROCAR) IMPLANT
TROCAR XCEL NON-BLD 5MMX100MML (ENDOMECHANICALS) ×2 IMPLANT
TUBING INSUFFLATION 10FT LAP (TUBING) ×2 IMPLANT

## 2023-08-30 NOTE — Transfer of Care (Signed)
 Immediate Anesthesia Transfer of Care Note  Patient: Sherry Anderson  Procedure(s) Performed: XI ROBOTIC ASSISTED HIATAL HERNIA REPAIR UPPER ENDOSCOPY  Patient Location: PACU  Anesthesia Type:General  Level of Consciousness: drowsy  Airway & Oxygen Therapy: Patient Spontanous Breathing and Patient connected to face mask oxygen  Post-op Assessment: Report given to RN and Post -op Vital signs reviewed and stable  Post vital signs: Reviewed and stable  Last Vitals:  Vitals Value Taken Time  BP 130/71 08/30/23 1423  Temp    Pulse 97 08/30/23 1424  Resp 15 08/30/23 1424  SpO2 100 % 08/30/23 1424  Vitals shown include unfiled device data.  Last Pain:  Vitals:   08/30/23 1036  TempSrc: Oral  PainSc:          Complications: No notable events documented.

## 2023-08-30 NOTE — Anesthesia Postprocedure Evaluation (Signed)
 Anesthesia Post Note  Patient: Sherry Anderson  Procedure(s) Performed: XI ROBOTIC ASSISTED HIATAL HERNIA REPAIR UPPER ENDOSCOPY     Patient location during evaluation: PACU Anesthesia Type: General Level of consciousness: awake and alert Pain management: pain level controlled Vital Signs Assessment: post-procedure vital signs reviewed and stable Respiratory status: spontaneous breathing, nonlabored ventilation, respiratory function stable and patient connected to nasal cannula oxygen Cardiovascular status: blood pressure returned to baseline and stable Postop Assessment: no apparent nausea or vomiting Anesthetic complications: no   No notable events documented.  Last Vitals:  Vitals:   08/30/23 1700 08/30/23 1744  BP: 135/77 (!) 144/70  Pulse: 83 84  Resp: 12 16  Temp:  36.7 C  SpO2: 99% 98%    Last Pain:  Vitals:   08/30/23 1903  TempSrc:   PainSc: 3                  Deseray Daponte P Joscelyne Renville

## 2023-08-30 NOTE — Interval H&P Note (Signed)
 History and Physical Interval Note:  08/30/2023 10:59 AM  Sherry Anderson  has presented today for surgery, with the diagnosis of LARGE HIATAL HERNIA GERD CAMERON'S ULCER.  The various methods of treatment have been discussed with the patient and family. After consideration of risks, benefits and other options for treatment, the patient has consented to  Procedure(s): XI ROBOTIC ASSISTED HIATAL HERNIA REPAIR WITH POSSIBLE WRAP, POSSIBLE MESH (N/A) UPPER ENDOSCOPY (N/A) as a surgical intervention.  The patient's history has been reviewed, patient examined, no change in status, stable for surgery.  I have reviewed the patient's chart and labs.  Questions were answered to the patient's satisfaction.     [name]

## 2023-08-30 NOTE — Op Note (Signed)
 PRE-OPERATIVE DIAGNOSIS:  LARGE HIATAL HERNIA   POST-OPERATIVE DIAGNOSIS:  LARGE TYPE III HIATAL HERNIA   PROCEDURE:  Procedure(s): XI ROBOTIC ASSISTED  TYPE III HIATAL HERNIA REPAIR with GASTROPEXY AND BILATERAL TAP BLOCK ESOPHAGOGASTRODUODENOSCOPY (EGD)   SURGEON:  Surgeon(s): Tanda Locus, MD FACS     ASSISTANTS: Mitzie Freund MD FACS   ANESTHESIA:   general   DRAINS: none   EBL: minimal   LOCAL MEDICATIONS USED:  MARCAINE     and OTHER EXPAREL    SPECIMEN:  Source of Specimen:  HERNIA SAC   DISPOSITION OF SPECIMEN:   DISCARDED   COUNTS:  YES   INDICATION FOR PROCEDURE: 63 yo female was referred for management of a large type III hiatal hernia and cameron ulcers.   We had an extensive conversation regarding surgical repair.  We discussed potentially not being able to do a fundoplication if we were unable to achieve adequate intra-abdominal esophageal length.  We also discussed potential mesh insertion.  Please see chart for additional details   PROCEDURE: Patient received 5000 units of subcutaneous heparin  preoperatively.  Patient was given Tylenol  and Decadron  and Zofran  preoperatively.  After informed consent was obtained patient was taken to the operating room at [facility].  They were placed upon on the operating room table.  General endotracheal anesthesia was established.  Sequential compression devices were placed.  Usual standard surgical fashion with ChloraPrep.  The patient was on IV antibiotic prior to skin incision.  A surgical timeout was performed.   Access to the abdomen was obtained using the Optiview technique in the left mid abdomen about 8 cm to the left of the umbilicus in the midclavicular line.  Using a 0 degree 5 mm laparoscope through a 5 mm trocar it was advanced through all layers of the abdominal wall and the abdominal cavity was carefully entered.  Pneumoperitoneum was smoothly established without any change in patient vital signs.  The  laparoscope was advanced and the abdominal cavity was surveilled.  There was no evidence of injury.  Patient was placed in reverse Trendelenburg.  A robotic 8 trocar was placed just above to the umbilicus.  An additional 8 mm robotic trocar was placed in the right mid abdomen and a assistant 5 mm trocar was placed in the right lateral abdominal wall.  We exchanged the Optiview trocar for a robotic 8 trocar and a final 8 mm robotic trocar was placed in the left lateral abdominal wall.  A bilateral laparoscopic tap block was performed for postoperative pain relief.  A Nathanson liver retractor was placed through a subxiphoid small incision to lift up the left lobe of the liver providing excellent visualization of the diaphragm.  The Nisource robot was then docked to the trocars.  A tip up grasper in arm 4, vessel sealer in arm 3, camera in arm 2 and a fenestrated bipolar in arm 1.  At this time and went to the surgeon console while my assistant stayed at the bedside.  An assistant was needed to help with tissue retraction and manipulation given the complexity of the case.  The patient had a large type III hiatal hernia.  The GE junction and the proximal stomach was above the diaphragm.  Approximately one third of the stomach was above the diaphragm.     I started my approach along the anterior diaphragm incising the peritoneum with the vessel sealer.  I was able to peel and get into the space of the hernia sac.  I continued this toward the right crus of the diaphragm.  We identified the plane between the hernia sac and the peritoneum along the right crus.  This was again taken down in sequential fashion using the vessel sealer.  Then with some gentle blunt dissection reduced some of the right sided hernia sac.  Some omentum was also reduced from the mediastinum as well.  I then turned my attention and started taking the hernia sac down along the left anterior diaphragm toward the left crus.  We then decided to  mobilize and take down some of the short gastric vessels off the proximal greater curvature of the stomach.  These were taken down in sequential fashion all the way up to the cardia and to the left crus.  We then turned our attention posteriorly to the junction of the left and right crus posteriorly.  I was able then to pass a Penrose drain around the retroesophageal area.  My assistant held this as I continued and started circumferential mobilization of the proximal stomach and esophagus in the mediastinum.  The aorta was visualized.  I did  see a right vagus nerve.  We did not visualize a left vagus nerve.   We continue to circumstantial mobilization posteriorly, anteriorly, left and right around the esophagus in the mediastinum.  Again did not visualize the left vagus but identified and preserve the right vagus.  Patient appeared to have a foreshortened esophagus.  We had achieved about 1 -2 cm of intra-abdominal esophageal length.  This time I debrided some of the hernia sac around the GE junction with the vessel sealer ensuring that we were not on the stomach or on the esophagus.  We returned to the mediastinum and a look to see if there is any additional length that we could obtain.  I did not feel that by taking the right vagus that would give us  significant additional length.  I then obtained the Olympus endoscope and went to the head of the bed and placed the endoscope in the patient's oropharynx and gently glided it down.  There is no evidence of esophageal injury.  I was able to easily advance the endoscope into the stomach.  I then pulled back on the endoscope and visualized the Z-line.  We transilluminated.  The Z-line was approximately 1 cm below the diaphragm.  Again reinspected the distal and midesophagus and there is no evidence of esophageal injury.  The stomach was desufflated and the endoscope was withdrawn and removed.  I returned to the surgeon console.  After discussion with partner I did not  feel that we could achieve any more significant intra-abdominal esophageal length by more proximal mobilization in the mediastinum.  We reduced intra-abdominal pressure to 10 mmHg.  I then reapproximated the left and right crura posteriorly with 4 interrupted 0 Ethibond sutures without tension. The 4th suture was from 12 o'clock to 10 o'clock.   I then placed 3 interrupted 0 Ethibond sutures along the cardia and fundus of the stomach to the diaphragm as a gastropexy suture to recreate the angle of his.  The stomach looked to be laying in the correct orientation.  There is no evidence of twisting of the stomach after a gastropexy sutures.  Those were removed along with the excised hernia sac.  Liver retractor was removed.  There is no evidence of injury to the left lobe of the liver.  The robot was undocked.  I scrubbed back in.  Trocars removed and the skin incisions  were closed with a 4 Monocryl followed by the application of Dermabond.  All needle, instrument, and sponge counts were correct x 2.  There were no immediate complications.  Patient tolerated suture well.  Patient was extubated and taken recovery room in stable condition   PLAN OF CARE: Admit for overnight observation   PATIENT DISPOSITION:  PACU - hemodynamically stable.   Delay start of Pharmacological VTE agent (>24hrs) due to surgical blood loss or risk of bleeding:  no

## 2023-08-30 NOTE — H&P (Signed)
 REFERRING PHYSICIAN: Craig Palma  PROVIDER: Ranald Alessio DARALYN BLUSH, MD  MRN: [id number] DOB: October 12, 1960 DATE OF ENCOUNTER: 08/05/2023 She is accompanied by an interpreter [name] Subjective  Chief Complaint: New Patient (GERD unspecified whether esophagitis present )   History of Present Illness: Sherry Anderson is a 63 y.o. female who is seen today as an office consultation at the request of Dr. Craig for evaluation of New Patient (GERD unspecified whether esophagitis present ) . Patient was seeing gastroenterology because of history of iron  deficiency anemia with fatigue, shortness of breath and dark stools. Patient was found to have a moderate to large hiatal hernia with Ole erosions  Comorbidities include anemia, hypertension  Patient was admitted to the hospital 10/19 through 10/21 for syncopal episode  She received IV iron   Patient is able to ride all the history in English. She states that she has had about 10 years of reflux. She was on omeprazole  but her GI doctor switched her pantoprazole . She states her reflux is generally well-controlled. She may wake up at night with acid. She may vomit some food 2-3 times a month. She does sleep elevated. No dysphagia. No early satiety. She does occasionally have a bitter taste in the back of her mouth. No pain with swallowing.  She reports a history of DVT and PE treated with blood thinners. No other family history of blood thinners.  Patient does have a shunt.  Endoscopic history: 2018 endoscopy Dr. Nandigam for epigastric discomfort showed 5 cm hiatal hernia congestion erythema entire stomach normal duodenum. Pathology negative for dysplasia, metaplasia malignancy or H. pylori. 01/14/2021 colonoscopy Dr. Nandigam due to personal history of colon polyps quality of the prep was good nonbleeding external/internal hemorrhoids otherwise unremarkable recall 5 years 07/12/2023 EGD - [facility] - see below   Review of Systems: A  complete review of systems was obtained from the patient. I have reviewed this information and discussed as appropriate with the patient. See HPI as well for other ROS.  ROS  Medical History: Past Medical History: Diagnosis Date Anemia Anxiety History of cancer Hypertension Thyroid  disease  There is no problem list on file for this patient.  Past Surgical History: Procedure Laterality Date back surgery HYSTERECTOMY   No Known Allergies  Current Outpatient Medications on File Prior to Visit Medication Sig Dispense Refill acetaminophen  (TYLENOL ) 325 MG tablet Take 650 mg by mouth every 4 (four) hours as needed for Pain cyanocobalamin  (VITAMIN B12) 1000 MCG tablet Take 1,000 mcg by mouth once daily ferrous sulfate  325 (65 FE) MG EC tablet Take 325 mg by mouth daily with breakfast levothyroxine  (SYNTHROID ) 75 MCG tablet Take 75 mcg by mouth once daily Take on an empty stomach with a glass of water at least 30-60 minutes before breakfast. lisinopriL  (ZESTRIL ) 20 MG tablet Take 20 mg by mouth once daily pantoprazole  (PROTONIX ) 40 MG DR tablet Take 40 mg by mouth once daily QUEtiapine  (SEROQUEL ) 50 MG tablet Take 50 mg by mouth at bedtime triamterene -hydroCHLOROthiazide  (MAXZIDE -25) 37.5-25 mg tablet Take 1 tablet by mouth once daily venlafaxine  (EFFEXOR -XR) 75 MG XR capsule Take 75 mg by mouth once daily  No current facility-administered medications on file prior to visit.  Family History Problem Relation Age of Onset Diabetes Mother Hyperlipidemia (Elevated cholesterol) Mother Hyperlipidemia (Elevated cholesterol) Father Hyperlipidemia (Elevated cholesterol) Sister   Social History  Tobacco Use Smoking Status Unknown Smokeless Tobacco Not on file   Social History  Socioeconomic History Marital status: Legally Separated Tobacco Use Smoking status: Unknown Substance and Sexual  Activity Alcohol  use: Defer Drug use: Defer  Social Drivers of Health  Financial  Resource Strain: Medium Risk (07/01/2023) Received from [facility] Overall Financial Resource Strain (CARDIA) Difficulty of Paying Living Expenses: Somewhat hard Food Insecurity: Food Insecurity Present (07/01/2023) Received from [facility] Hunger Vital Sign Worried About Running Out of Food in the Last Year: Often true Ran Out of Food in the Last Year: Sometimes true Transportation Needs: No Transportation Needs (07/01/2023) Received from [facility] - Transportation Lack of Transportation (Medical): No Lack of Transportation (Non-Medical): No Physical Activity: Insufficiently Active (07/01/2023) Received from [facility] Exercise Vital Sign Days of Exercise per Week: 7 days Minutes of Exercise per Session: 20 min Stress: Stress Concern Present (07/01/2023) Received from [facility] - Occupational Stress Questionnaire Feeling of Stress : To some extent Social Connections: Moderately Isolated (07/01/2023) Received from [facility] Social Connection and Isolation Panel [NHANES] Frequency of Communication with Friends and Family: More than three times a week Frequency of Social Gatherings with Friends and Family: Three times a week Attends Religious Services: 1 to 4 times per year Active Member of Clubs or Organizations: No Attends Banker Meetings: Never Marital Status: Separated  Objective:  Vitals: 08/05/23 0958 BP: 120/66 Pulse: 98 Temp: 36.5 C (97.7 F) SpO2: 98% Weight: 79.7 kg (175 lb 9.6 oz) Height: 157.5 cm (5' 2) PainSc: 0-No pain  Body mass index is 32.12 kg/m.  Constitutional: NAD; conversant; no deformities Eyes: Moist conjunctiva; no lid lag; anicteric; PERRL Neck: Trachea midline; no thyromegaly Lungs: Normal respiratory effort; no tactile fremitus CV: RRR; no palpable thrills; no pitting edema GI: Abd soft, nontender, nondistended, old trocar scars; no palpable hepatosplenomegaly MSK:  Normal gait; no clubbing/cyanosis Psychiatric: Appropriate affect; alert and oriented x3 Lymphatic: No palpable cervical or axillary lymphadenopathy Skin: No rash, lesions or jaundice  Labs, Imaging and Diagnostic Testing: Pcp note 07/05/23  GI office note Tober 31st 2024  05/12/2023 echo EF 70/75%, CT coronary minimal plaque burden.  CT abdomen pelvis with contrast showed sliding hiatal hernia, hepatic steatosis otherwise unremarkable. Patient was given IV iron . FOBT was not performed as no stool in the vault. 06/09/2023 Hgb 9.8 MCV 87 normal platelets and no leukocytosis. 06/06/2023 ferritin 38 (compared to a month ago ferritin at 8), iron  96, saturation ratio 22, folate 9.6, B12 6278 patient had appropriate reticular response  Upper gi 07/21/23  FINDINGS: A scout radiograph of the abdomen was acquired. Nonobstructive bowel gas pattern. Tubing projects over the left lower chest, as well as the abdomen and pelvis. This presumably reflects ventriculoperitoneal shunt tubing. Correlate with surgical history. Levocurvature of the lumbar spine. Post-surgical changes to the lower lumbar and sacral spine.  Fluoroscopic evaluation demonstrates normal caliber and smooth contour of the esophagus. No evidence of fixed stricture, mass or mucosal abnormality.  Moderate intermittent esophageal dysmotility with tertiary contractions.  No gastroesophageal reflux observed.  The patient swallowed a 13 mm barium tablet, which passed into the stomach without significant delay.  Large hiatal hernia. Otherwise normal appearance of the stomach, duodenal bulb and duodenal sweep. No evidence of ulceration, fold thickening or mass.  Please note, a portion of the acquired fluoroscopic images failed to save and transfer to PACS.  IMPRESSION: 1. Large hiatal hernia. 2. Moderate esophageal dysmotility with tertiary contractions. 3. Otherwise unremarkable upper GI series as described.  Egd  06/2023  CT PE 05/2020 - small right PE  Hematology office note March 2022  Assessment and Plan:  Diagnoses and all orders for this visit:  Hiatal hernia with GERD  Ole ulcer, unspecified ulcer chronicity  History of DVT (deep vein thrombosis)  History of pulmonary embolism  S/P VP shunt    The patient was given an educational booklet regarding GERD which also talks about hiatal hernias. I discussed the anatomy of the foregut. We discussed the evaluation and workup for heartburn and hiatal hernias.  We then discussed steps of a robotic hiatal hernia repair with possible mesh with fundoplication/wrap. We discussed the typical hospitalization. We discussed the typical recovery. We discussed the diet transition over the course of 8 to 12 weeks. We discussed the need to change eating techniques and behaviors in order to mitigate postoperative pain and discomfort. We discussed that hiatal hernia repair/reflux surgery is not perfect . We discussed that there are typical quirks but our goal is to plan and only have minor quirks. We discussed what some of those quirks could be such as issues with tolerance to certain food textures like breads or pastas, burping. We did discuss the possibility of needing ongoing reflux medication.  We then discussed the risk of surgery included but not limited to bleeding, infection, injury to surrounding structures, injury to the lung/aorta, injury to the vagus nerves, hiatal hernia recurrence, clot formation, perioperative cardiac and pulmonary events. We discussed slipped wrap. We discussed the issues with vagal nerve injury such as gastroparesis and diarrhea. We discussed gas bloat. We discussed issues with potential mesh insertion such as dysphagia/fistula. We discussed that I am typically selective in whether or not we use absorbable mesh. We discussed the recurrence risk is at least 15%. We discussed that with subsequent diaphragm/esophageal surgery  it is technically more complicated and higher risk as well as satisfaction scores decrease.  Did discuss the possibility of not being able to achieve enough intra-abdominal esophageal length and just doing a hiatal hernia repair alone.  We also discussed pros and cons of not undergoing repair. Did discuss the natural progression of hiatal hernias if left untreated  We did discuss the increased risk of recurrent blood clots since she has had a prior history of blood clot. We discussed how we will take steps to mitigate that  Patient would like to proceed with surgical repair  This patient encounter took 67 minutes today to perform the following: take history, perform exam, review outside records, interpret imaging, counsel the patient on their diagnosis and document encounter, findings & plan in the EHR  No follow-ups on file.  Evanny Ellerbe DARALYN BLUSH, MD General, Minimally Invasive, & Bariatric Surgery     Electronically signed by Blush Camellia Daralyn, MD at 08/05/2023 10:55 AM EST

## 2023-08-30 NOTE — Anesthesia Procedure Notes (Signed)
 Procedure Name: Intubation Date/Time: 08/30/2023 11:40 AM  Performed by: Therisa Doyal CROME, CRNAPre-anesthesia Checklist: Patient identified, Emergency Drugs available, Suction available and Patient being monitored Patient Re-evaluated:Patient Re-evaluated prior to induction Oxygen Delivery Method: Circle system utilized Preoxygenation: Pre-oxygenation with 100% oxygen Induction Type: IV induction Ventilation: Mask ventilation without difficulty Laryngoscope Size: Miller and 2 Grade View: Grade II Tube type: Oral Tube size: 7.0 mm Number of attempts: 1 Airway Equipment and Method: Stylet and Oral airway Placement Confirmation: ETT inserted through vocal cords under direct vision, positive ETCO2 and breath sounds checked- equal and bilateral Secured at: 21 cm Tube secured with: Tape Dental Injury: Teeth and Oropharynx as per pre-operative assessment

## 2023-08-30 NOTE — Plan of Care (Signed)
   Problem: Education: Goal: Knowledge of General Education information will improve Description Including pain rating scale, medication(s)/side effects and non-pharmacologic comfort measures Outcome: Progressing   Problem: Activity: Goal: Risk for activity intolerance will decrease Outcome: Progressing   Problem: Safety: Goal: Ability to remain free from injury will improve Outcome: Progressing

## 2023-08-31 ENCOUNTER — Ambulatory Visit (HOSPITAL_COMMUNITY): Payer: Medicare HMO

## 2023-08-31 ENCOUNTER — Encounter (HOSPITAL_COMMUNITY): Payer: Self-pay | Admitting: General Surgery

## 2023-08-31 DIAGNOSIS — Z48815 Encounter for surgical aftercare following surgery on the digestive system: Secondary | ICD-10-CM | POA: Diagnosis not present

## 2023-08-31 DIAGNOSIS — K449 Diaphragmatic hernia without obstruction or gangrene: Secondary | ICD-10-CM | POA: Diagnosis not present

## 2023-08-31 LAB — BASIC METABOLIC PANEL
Anion gap: 11 (ref 5–15)
BUN: 14 mg/dL (ref 8–23)
CO2: 20 mmol/L — ABNORMAL LOW (ref 22–32)
Calcium: 8.7 mg/dL — ABNORMAL LOW (ref 8.9–10.3)
Chloride: 102 mmol/L (ref 98–111)
Creatinine, Ser: 0.98 mg/dL (ref 0.44–1.00)
GFR, Estimated: >60 mL/min (ref >60)
Glucose, Bld: 155 mg/dL — ABNORMAL HIGH (ref 70–99)
Potassium: 3.8 mmol/L (ref 3.5–5.1)
Sodium: 133 mmol/L — ABNORMAL LOW (ref 135–145)

## 2023-08-31 LAB — CBC
HCT: 32.6 % — ABNORMAL LOW (ref 36.0–46.0)
Hemoglobin: 10.7 g/dL — ABNORMAL LOW (ref 12.0–15.0)
MCH: 28.5 pg (ref 26.0–34.0)
MCHC: 32.8 g/dL (ref 30.0–36.0)
MCV: 86.7 fL (ref 80.0–100.0)
Platelets: 222 10*3/uL (ref 150–400)
RBC: 3.76 MIL/uL — ABNORMAL LOW (ref 3.87–5.11)
RDW: 14.3 % (ref 11.5–15.5)
WBC: 10.8 10*3/uL — ABNORMAL HIGH (ref 4.0–10.5)
nRBC: 0 % (ref 0.0–0.2)

## 2023-08-31 LAB — MAGNESIUM: Magnesium: 1.9 mg/dL (ref 1.7–2.4)

## 2023-08-31 MED ORDER — IOHEXOL 300 MG/ML  SOLN
100.0000 mL | Freq: Once | INTRAMUSCULAR | Status: AC | PRN
  Administered 2023-08-31: 100 mL via ORAL

## 2023-08-31 MED ORDER — ACETAMINOPHEN 325 MG PO TABS: 1000.0000 mg | ORAL_TABLET | Freq: Four times a day (QID) | ORAL | Status: AC

## 2023-08-31 MED ORDER — TRAMADOL HCL 50 MG PO TABS: 50.0000 mg | ORAL_TABLET | Freq: Four times a day (QID) | ORAL | 0 refills | Status: AC | PRN

## 2023-08-31 MED ORDER — ONDANSETRON HCL 4 MG PO TABS: 4.0000 mg | ORAL_TABLET | Freq: Every day | ORAL | 1 refills | Status: DC | PRN

## 2023-08-31 NOTE — TOC CM/SW Note (Signed)
 Transition of Care [facility]) - Inpatient Brief Assessment   Patient Details  Name: Sherry Anderson MRN: [id number] Date of Birth: 1961-06-19  Transition of Care [facility]) CM/SW Contact:    Bari Leys, RN Phone Number: 08/31/2023, 10:35 AM   Clinical Narrative: Met with patient at bedside to introduce role of TOC/NCM and review for dc planning, pt reports she has an established PCP and pharmacy, no current home care services or home DME, reports she feels safe returning home with support from her family, confirmed transportation is available at discharge. TOC Brief Assessment completed. No TOC needs identified at this time.     Transition of Care Asessment: Insurance and Status: Insurance coverage has been reviewed Patient has primary care physician: Yes Home environment has been reviewed: resides in privare residence with family support Prior level of function:: Independent Prior/Current Home Services: No current home services Social Drivers of Health Review: SDOH reviewed no interventions necessary Readmission risk has been reviewed: Yes Transition of care needs: no transition of care needs at this time

## 2023-08-31 NOTE — Discharge Instructions (Signed)
EATING AFTER YOUR ESOPHAGEAL SURGERY (Stomach Fundoplication, Hiatal Hernia repair, Achalasia surgery, etc)  ######################################################################  EAT Start with a full liquid diet (see below) Gradually transition to a high fiber diet with a fiber supplement over the next month after discharge.    WALK Walk an hour a day.  Control your pain to do that.    CONTROL PAIN Control pain so that you can walk, sleep, tolerate sneezing/coughing, go up/down stairs.  HAVE A BOWEL MOVEMENT DAILY Keep your bowels regular to avoid problems.  OK to try a laxative to override constipation.  OK to use an antidairrheal to slow down diarrhea.  Call if not better after 2 tries  CALL IF YOU HAVE PROBLEMS/CONCERNS Call if you are still struggling despite following these instructions. Call if you have concerns not answered by these instructions  ######################################################################   After your esophageal surgery, expect some sticking with swallowing over the next 1-2 months.    If food sticks when you eat, it is called "dysphagia".  This is due to swelling around your esophagus at the wrap & hiatal diaphragm repair.  It will gradually ease off over the next few months.  To help you through this temporary phase, we start you out on a full liquid diet.  Your first meal in the hospital was thin liquids.  You should have been given a full liquid diet by the time you left the hospital. Stay on clears and full liquids for the first week. Some patients may need to stay on a liquid diet for up to 2 weeks if having trouble swallowing.  Once tolerating that well, you can advance to pureed diet.   We ask patients to stay on a pureed diet for the 2nd-3rd week to avoid anything getting "stuck" near your recent surgery.  Don't be alarmed if your ability to swallow doesn't progress according to this plan.  Everyone is different and some diets can advance  more or less quickly.    It is often helpful to crush your medications or split them as they can sometimes stick, especially the first week or so.   Some BASIC RULES to follow are: Maintain an upright position whenever eating or drinking. Take small bites - just a teaspoon size bite at a time. Eat slowly.  It may also help to eat only one food at a time. Consider nibbling through smaller, more frequent meals & avoid the urge to eat BIG meals Do not push through feelings of fullness, nausea, or bloatedness Do not mix solid foods and liquids in the same mouthful Try not to "wash foods down" with large gulps of liquids. Avoid carbonated (bubbly/fizzy) drinks.   Avoid foods that make you feel gassy or bloated.  Start with bland foods first.  Wait on trying greasy, fried, or spicy meals until you are tolerating more bland solids well. Understand that it will be hard to burp and belch at first.  This gradually improves with time.  Expect to be more gassy/flatulent/bloated initially.  Walking will help your body manage it better. Consider using medications for bloating that contain simethicone such as  Maalox or Gas-X  Consider crushing her medications, especially smaller pills.  The ability to swallow pills should get easier after a few weeks Eat in a relaxed atmosphere & minimize distractions. Avoid talking while eating.   Do not use straws. Following each meal, sit in an upright position (90 degree angle) for 60 to 90 minutes.  Going for a short walk can   help as well If food does stick, don't panic.  Try to relax and let the food pass on its own.  Sipping WARM LIQUID such as strong hot black tea can also help slide it down.   Be gradual in changes & use common sense:  -If you easily tolerating a certain "level" of foods, advance to the next level gradually -If you are having trouble swallowing a particular food, then avoid it.   -If food is sticking when you advance your diet, go back to  thinner previous diet (the lower LEVEL) for 1-2 days.  LEVEL 2 = PUREED DIET  Start 1- 2 WEEKS AFTER SURGERY IF YOU ARE TOLERATING A FULL LIQUID DIET EASILY  -Foods in this group are pureed or blenderized to a smooth, mashed potato-like consistency.  -If necessary, the pureed foods can keep their shape with the addition of a thickening agent.   -Meat should be pureed to a smooth, pasty consistency.  Hot broth or gravy may be added to the pureed meat, approximately 1 oz. of liquid per 3 oz. serving of meat. -CAUTION:  If any foods do not puree into a smooth consistency, swallowing will be more difficult.  (For example, nuts or seeds sometimes do not blend well.)  Hot Foods Cold Foods  Pureed scrambled eggs and cheese Pureed cottage cheese  Baby cereals Thickened juices and nectars  Thinned cooked cereals (no lumps) Thickened milk or eggnog  Pureed French toast or pancakes Ensure  Mashed potatoes Ice cream  Pureed parsley, au gratin, scalloped potatoes, candied sweet potatoes Fruit or Italian ice, sherbet  Pureed buttered or alfredo noodles Plain yogurt  Pureed vegetables (no corn or peas) Instant breakfast  Pureed soups and creamed soups Smooth pudding, mousse, custard  Pureed scalloped apples Whipped gelatin  Gravies Sugar, syrup, honey, jelly  Sauces, cheese, tomato, barbecue, white, creamed Cream  Any baby food Creamer  Alcohol in moderation (not beer or champagne) Margarine  Coffee or tea Mayonnaise   Ketchup, mustard   Apple sauce   SAMPLE MENU:  PUREED DIET Breakfast Lunch Dinner  Orange juice, 1/2 cup Cream of wheat, 1/2 cup Pineapple juice, 1/2 cup Pureed turkey, barley soup, 3/4 cup Pureed Hawaiian chicken, 3 oz  Scrambled eggs, mashed or blended with cheese, 1/2 cup Tea or coffee, 1 cup  Whole milk, 1 cup  Non-dairy creamer, 2 Tbsp. Mashed potatoes, 1/2 cup Pureed cooled broccoli, 1/2 cup Apple sauce, 1/2 cup Coffee or tea Mashed potatoes, 1/2 cup Pureed spinach,  1/2 cup Frozen yogurt, 1/2 cup Tea or coffee      LEVEL 3 = SOFT DIET  After your first 4 weeks, you can advance to a soft diet.   Keep on this diet until everything goes down easily.  Hot Foods Cold Foods  White fish Cottage cheese  Stuffed fish Junior baby fruit  Baby food meals Semi thickened juices  Minced soft cooked, scrambled, poached eggs nectars  Souffle & omelets Ripe mashed bananas  Cooked cereals Canned fruit, pineapple sauce, milk  potatoes Milkshake  Buttered or Alfredo noodles Custard  Cooked cooled vegetable Puddings, including tapioca  Sherbet Yogurt  Vegetable soup or alphabet soup Fruit ice, Italian ice  Gravies Whipped gelatin  Sugar, syrup, honey, jelly Junior baby desserts  Sauces:  Cheese, creamed, barbecue, tomato, white Cream  Coffee or tea Margarine   SAMPLE MENU:  LEVEL 3 Breakfast Lunch Dinner  Orange juice, 1/2 cup Oatmeal, 1/2 cup Scrambled eggs with cheese, 1/2 cup Decaffeinated tea,   1 cup Whole milk, 1 cup Non-dairy creamer, 2 Tbsp Pineapple juice, 1/2 cup Minced beef, 3 oz Gravy, 2 Tbsp Mashed potatoes, 1/2 cup Minced fresh broccoli, 1/2 cup Applesauce, 1/2 cup Coffee, 1 cup Turkey, barley soup, 3/4 cup Minced Hawaiian chicken, 3 oz Mashed potatoes, 1/2 cup Cooked spinach, 1/2 cup Frozen yogurt, 1/2 cup Non-dairy creamer, 2 Tbsp      LEVEL 4 = CHOPPED DIET  -After all the foods in level 3 (soft diet) are passing through well you should advance up to more chopped foods.  -It is still important to cut these foods into small pieces and eat slowly.  Hot Foods Cold Foods  Poultry Cottage cheese  Chopped Swedish meatballs Yogurt  Meat salads (ground or flaked meat) Milk  Flaked fish (tuna) Milkshakes  Poached or scrambled eggs Soft, cold, dry cereal  Souffles and omelets Fruit juices or nectars  Cooked cereals Chopped canned fruit  Chopped French toast or pancakes Canned fruit cocktail  Noodles or pasta (no rice) Pudding,  mousse, custard  Cooked vegetables (no frozen peas, corn, or mixed vegetables) Green salad  Canned small sweet peas Ice cream  Creamed soup or vegetable soup Fruit ice, Italian ice  Pureed vegetable soup or alphabet soup Non-dairy creamer  Ground scalloped apples Margarine  Gravies Mayonnaise  Sauces:  Cheese, creamed, barbecue, tomato, white Ketchup  Coffee or tea Mustard   SAMPLE MENU:  LEVEL 4 Breakfast Lunch Dinner  Orange juice, 1/2 cup Oatmeal, 1/2 cup Scrambled eggs with cheese, 1/2 cup Decaffeinated tea, 1 cup Whole milk, 1 cup Non-dairy creamer, 2 Tbsp Ketchup, 1 Tbsp Margarine, 1 tsp Salt, 1/4 tsp Sugar, 2 tsp Pineapple juice, 1/2 cup Ground beef, 3 oz Gravy, 2 Tbsp Mashed potatoes, 1/2 cup Cooked spinach, 1/2 cup Applesauce, 1/2 cup Decaffeinated coffee Whole milk Non-dairy creamer, 2 Tbsp Margarine, 1 tsp Salt, 1/4 tsp Pureed turkey, barley soup, 3/4 cup Barbecue chicken, 3 oz Mashed potatoes, 1/2 cup Ground fresh broccoli, 1/2 cup Frozen yogurt, 1/2 cup Decaffeinated tea, 1 cup Non-dairy creamer, 2 Tbsp Margarine, 1 tsp Salt, 1/4 tsp Sugar, 1 tsp    LEVEL 5:  REGULAR FOODS  -Foods in this group are soft, moist, regularly textured foods.   -This level includes meat and breads, which tend to be the hardest things to swallow.   -Eat very slowly, chew well and continue to avoid carbonated drinks. -most people are at this level in 6 weeks  Hot Foods Cold Foods  Baked fish or skinned Soft cheeses - cottage cheese  Souffles and omelets Cream cheese  Eggs Yogurt  Stuffed shells Milk  Spaghetti with meat sauce Milkshakes  Cooked cereal Cold dry cereals (no nuts, dried fruit, coconut)  French toast or pancakes Crackers  Buttered toast Fruit juices or nectars  Noodles or pasta (no rice) Canned fruit  Potatoes (all types) Ripe bananas  Soft, cooked vegetables (no corn, lima, or baked beans) Peeled, ripe, fresh fruit  Creamed soups or vegetable soup Cakes  (no nuts, dried fruit, coconut)  Canned chicken noodle soup Plain doughnuts  Gravies Ice cream  Bacon dressing Pudding, mousse, custard  Sauces:  Cheese, creamed, barbecue, tomato, white Fruit ice, Italian ice, sherbet  Decaffeinated tea or coffee Whipped gelatin  Pork chops Regular gelatin   Canned fruited gelatin molds   Sugar, syrup, honey, jam, jelly   Cream   Non-dairy   Margarine   Oil   Mayonnaise   Ketchup   Mustard   TROUBLESHOOTING IRREGULAR BOWELS    1) Avoid extremes of bowel movements (no bad constipation/diarrhea)  2) Miralax 17gm mixed in 8oz. water or juice-daily. May use BID as needed.  3) Gas-x,Phazyme, etc. as needed for gas & bloating.  4) Soft,bland diet. No spicy,greasy,fried foods.  5) Prilosec over-the-counter as needed  6) May hold gluten/wheat products from diet to see if symptoms improve.  7) May try probiotics (Align, Activa, etc) to help calm the bowels down  7) If symptoms become worse call back immediately.    If you have any questions please call our office at [facility]: [phone].  

## 2023-08-31 NOTE — Plan of Care (Signed)
  Problem: Education: Goal: Knowledge of General Education information will improve Description: Including pain rating scale, medication(s)/side effects and non-pharmacologic comfort measures Outcome: Progressing   Problem: Health Behavior/Discharge Planning: Goal: Ability to manage health-related needs will improve Outcome: Progressing   Problem: Pain Management: Goal: General experience of comfort will improve Outcome: Progressing

## 2023-08-31 NOTE — Discharge Summary (Signed)
 Physician Discharge Summary  Sherry Anderson:[id number] DOB: 01/06/1961 DOA: 08/30/2023  PCP: Candee Cha, MD  Admit date: 08/30/2023 Discharge date: 08/31/2023  Recommendations for Outpatient Follow-up:     Follow-up Information     Sherry Hummingbird, MD. Go on 10/07/2023.   Specialty: General Surgery Why: 3:30 pm, For wound re-check Contact information: [address] [place] [address] [phone]                Discharge Diagnoses:  Hiatal hernia with gerd with h/o cameron erosions S/p robotic assisted repair of large type III hiatal hernia with gastropexy and upper endoscopy Hypertension hypothyroidism  Surgical Procedure: robotic assisted repair of large type III hiatal hernia with gastropexy and upper endoscopy  Discharge Condition: good Disposition: hom3  Diet recommendation: full liquid diet  Filed Weights   08/30/23 1021  Weight: 76.2 kg    History of present illness:  63 yo female was referred for management of a large type III hiatal hernia and cameron ulcers.   We had an extensive conversation regarding surgical repair.   Hospital Course:  Underwent above mentioned procedure. Wrap not done due to inadequate intra-abdominal esophageal length. Kept overnight for monitoring. Maintained on perioperative chemical vte prophylaxis. Toelrated clears. POD 1 upper gi showed no leak. Advanced to FLD which tolerated and was felt stable for discharge. No nausea/vomting. Min abd pain.   BP 107/61 (BP Location: Right Arm)   Pulse 61   Temp 98.2 F (36.8 C) (Oral)   Resp 16   Ht 5\' 2"  (1.575 m)   Wt 76.2 kg   LMP  (LMP Unknown) Comment: not sexually active  SpO2 96%   BMI 30.73 kg/m   Gen: alert, NAD, non-toxic appearing Pupils: equal, no scleral icterus Pulm: ymmetric chest rise CV: regular  Abd: soft, mild approp tender, nondistended. . No cellulitis. No incisional hernia Ext: no edema, no calf tenderness Skin: no rash,  no jaundice    Discharge Instructions  Discharge Instructions     Call MD for:   Complete by: As directed    Temperature >101   Call MD for:  hives   Complete by: As directed    Call MD for:  persistant dizziness or light-headedness   Complete by: As directed    Call MD for:  persistant nausea and vomiting   Complete by: As directed    Call MD for:  redness, tenderness, or signs of infection (pain, swelling, redness, odor or green/yellow discharge around incision site)   Complete by: As directed    Call MD for:  severe uncontrolled pain   Complete by: As directed    Diet full liquid   Complete by: As directed    Discharge instructions   Complete by: As directed    See [facility] discharge instructions   Increase activity slowly   Complete by: As directed       Allergies as of 08/31/2023   No Known Allergies      Medication List     TAKE these medications    acetaminophen  325 MG tablet Commonly known as: TYLENOL  Take 3 tablets (975 mg total) by mouth every 6 (six) hours for 5 days. OTC PRN What changed:  how much to take when to take this reasons to take this   B-12 PO Take 1,000 mcg by mouth daily.   CALCIUM  + D PO Take 1 tablet by mouth daily.   ferrous sulfate  325 (65 FE) MG tablet Take  1 tablet (325 mg total) by mouth every other day.   levothyroxine  75 MCG tablet Commonly known as: SYNTHROID  Take 1 tablet (75 mcg total) by mouth every morning. 30 minutes before food   lisinopril  20 MG tablet Commonly known as: ZESTRIL  Take 1 tablet (20 mg total) by mouth at bedtime.   ondansetron  4 MG tablet Commonly known as: Zofran  Take 1 tablet (4 mg total) by mouth daily as needed for nausea or vomiting.   pantoprazole  40 MG tablet Commonly known as: PROTONIX  Take 1 tablet (40 mg total) by mouth daily.   PROBIOTIC PO Take 1 tablet by mouth daily.   QUEtiapine  50 MG tablet Commonly known as: SEROQUEL  Take 1 tablet (50 mg total) by mouth at bedtime.    Theratears 0.25 % Soln Generic drug: Carboxymethylcellulose Sodium Place 1 drop into both eyes daily as needed (Dry eyes).   traMADol  50 MG tablet Commonly known as: Ultram  Take 1 tablet (50 mg total) by mouth every 6 (six) hours as needed for up to 7 days.   triamterene -hydrochlorothiazide  37.5-25 MG capsule Commonly known as: DYAZIDE  TAKE 1 CAPSULE BY MOUTH EVERY DAY   venlafaxine  XR 75 MG 24 hr capsule Commonly known as: EFFEXOR -XR TAKE THREE CAPSULES BY MOUTH ONCE DAILY        Follow-up Information     Sherry Hummingbird, MD. Go on 10/07/2023.   Specialty: General Surgery Why: 3:30 pm, For wound re-check Contact information: [address] [place] [address] [phone]                  The results of significant diagnostics from this hospitalization (including imaging, microbiology, ancillary and laboratory) are listed below for reference.    Significant Diagnostic Studies: DG UGI W SINGLE CM (SOL OR THIN BA) Result Date: 08/31/2023 CLINICAL DATA:  282018 S/P repair of paraesophageal hernia 282018 EXAM: WATER SOLUBLE UPPER GI SERIES TECHNIQUE: Single-column upper GI series was performed using water soluble contrast. Radiation Exposure Index (as provided by the fluoroscopic device): 727.4 mGy Kerma CONTRAST:  100 mL of Omnipaque  300. COMPARISON:  None Available. FLUOROSCOPY: Fluoroscopy Time:  1 minute 18 seconds. FINDINGS: Scout radiograph: Left mediastinal drain noted. No free air under the domes of diaphragm. Nonobstructive bowel gas pattern. No paraesophageal hernia noted. There is smooth passage of contrast from the esophagus into the stomach. No evidence of leak. IMPRESSION: *Status post paraesophageal hernia repair.  No leak. Electronically Signed   By: Beula Brunswick M.D.   On: 08/31/2023 09:57    Microbiology: No results found for this or any previous visit (from the past 240 hours).   Labs: Basic Metabolic Panel: Recent Labs  Lab  08/30/23 1050 08/31/23 0336  NA 131* 133*  K 3.1* 3.8  CL 102 102  CO2 23 20*  GLUCOSE 98 155*  BUN 16 14  CREATININE 0.99 0.98  CALCIUM  8.6* 8.7*  MG  --  1.9    CBC: Recent Labs  Lab 08/31/23 0336  WBC 10.8*  HGB 10.7*  HCT 32.6*  MCV 86.7  PLT 222     Principal Problem:   S/P laparoscopic hernia repair   Time coordinating discharge: 15 min  Signed:  Fran Imus, MD [facility],  [phone] 08/31/2023, 11:50 AM

## 2023-08-31 NOTE — Plan of Care (Signed)
  Problem: Education: Goal: Knowledge of General Education information will improve Description: Including pain rating scale, medication(s)/side effects and non-pharmacologic comfort measures 08/31/2023 0733 by Everett Hitt, RN Outcome: Progressing 08/30/2023 1738 by Everett Hitt, RN Outcome: Progressing   Problem: Activity: Goal: Risk for activity intolerance will decrease 08/31/2023 0733 by Everett Hitt, RN Outcome: Progressing 08/30/2023 1738 by Everett Hitt, RN Outcome: Progressing   Problem: Pain Management: Goal: General experience of comfort will improve 08/31/2023 0733 by Everett Hitt, RN Outcome: Progressing 08/30/2023 1738 by Everett Hitt, RN Outcome: Progressing

## 2023-09-13 ENCOUNTER — Telehealth: Payer: Self-pay | Admitting: *Deleted

## 2023-09-13 NOTE — Progress Notes (Signed)
Complex Care Management Care Guide Note  09/13/2023 Name: Sherry Anderson MRN: [id number] DOB: 1961-02-28  Sherry Anderson is a 63 y.o. year old female who is a primary care patient of [name] [name], MD and is actively engaged with the care management team. I reached out to Evern Bio by phone today to assist with re-scheduling  with the RN Case Manager.  Follow up plan: Unsuccessful telephone outreach attempt made. A HIPAA compliant phone message was left for the patient providing contact information and requesting a return call.  Gwenevere Ghazi  [facility]  [facility], [facility] Guide  Direct Dial: [phone]  Fax [phone]

## 2023-09-19 ENCOUNTER — Observation Stay (HOSPITAL_COMMUNITY): Payer: Medicare HMO

## 2023-09-19 ENCOUNTER — Other Ambulatory Visit: Payer: Self-pay

## 2023-09-19 ENCOUNTER — Encounter (HOSPITAL_COMMUNITY): Payer: Self-pay

## 2023-09-19 ENCOUNTER — Emergency Department (HOSPITAL_COMMUNITY): Payer: Medicare HMO

## 2023-09-19 ENCOUNTER — Inpatient Hospital Stay (HOSPITAL_COMMUNITY)
Admission: EM | Admit: 2023-09-19 | Discharge: 2023-09-22 | DRG: 314 | Disposition: A | Discharge status: Home / Self Care | Payer: Medicare HMO | Attending: Family Medicine | Admitting: Family Medicine

## 2023-09-19 DIAGNOSIS — Z555 Less than a high school diploma: Secondary | ICD-10-CM

## 2023-09-19 DIAGNOSIS — M1990 Unspecified osteoarthritis, unspecified site: Secondary | ICD-10-CM | POA: Diagnosis present

## 2023-09-19 DIAGNOSIS — Z5948 Other specified lack of adequate food: Secondary | ICD-10-CM

## 2023-09-19 DIAGNOSIS — E878 Other disorders of electrolyte and fluid balance, not elsewhere classified: Secondary | ICD-10-CM | POA: Diagnosis present

## 2023-09-19 DIAGNOSIS — Z86711 Personal history of pulmonary embolism: Secondary | ICD-10-CM

## 2023-09-19 DIAGNOSIS — Z79899 Other long term (current) drug therapy: Secondary | ICD-10-CM

## 2023-09-19 DIAGNOSIS — K55059 Acute (reversible) ischemia of intestine, part and extent unspecified: Secondary | ICD-10-CM | POA: Diagnosis present

## 2023-09-19 DIAGNOSIS — Z981 Arthrodesis status: Secondary | ICD-10-CM

## 2023-09-19 DIAGNOSIS — F32A Depression, unspecified: Secondary | ICD-10-CM | POA: Diagnosis present

## 2023-09-19 DIAGNOSIS — I959 Hypotension, unspecified: Secondary | ICD-10-CM | POA: Diagnosis present

## 2023-09-19 DIAGNOSIS — E039 Hypothyroidism, unspecified: Secondary | ICD-10-CM | POA: Diagnosis present

## 2023-09-19 DIAGNOSIS — Z923 Personal history of irradiation: Secondary | ICD-10-CM

## 2023-09-19 DIAGNOSIS — Z7901 Long term (current) use of anticoagulants: Secondary | ICD-10-CM

## 2023-09-19 DIAGNOSIS — Z8249 Family history of ischemic heart disease and other diseases of the circulatory system: Secondary | ICD-10-CM

## 2023-09-19 DIAGNOSIS — Z8541 Personal history of malignant neoplasm of cervix uteri: Secondary | ICD-10-CM

## 2023-09-19 DIAGNOSIS — E860 Dehydration: Secondary | ICD-10-CM | POA: Diagnosis present

## 2023-09-19 DIAGNOSIS — E785 Hyperlipidemia, unspecified: Secondary | ICD-10-CM | POA: Diagnosis present

## 2023-09-19 DIAGNOSIS — I81 Portal vein thrombosis: Secondary | ICD-10-CM

## 2023-09-19 DIAGNOSIS — Z79890 Hormone replacement therapy (postmenopausal): Secondary | ICD-10-CM

## 2023-09-19 DIAGNOSIS — Z8349 Family history of other endocrine, nutritional and metabolic diseases: Secondary | ICD-10-CM

## 2023-09-19 DIAGNOSIS — I9789 Other postprocedural complications and disorders of the circulatory system, not elsewhere classified: Secondary | ICD-10-CM | POA: Diagnosis not present

## 2023-09-19 DIAGNOSIS — R7401 Elevation of levels of liver transaminase levels: Secondary | ICD-10-CM | POA: Insufficient documentation

## 2023-09-19 DIAGNOSIS — F419 Anxiety disorder, unspecified: Secondary | ICD-10-CM | POA: Diagnosis present

## 2023-09-19 DIAGNOSIS — R1010 Upper abdominal pain, unspecified: Secondary | ICD-10-CM | POA: Diagnosis not present

## 2023-09-19 DIAGNOSIS — N179 Acute kidney failure, unspecified: Secondary | ICD-10-CM | POA: Diagnosis not present

## 2023-09-19 DIAGNOSIS — E8809 Other disorders of plasma-protein metabolism, not elsewhere classified: Secondary | ICD-10-CM | POA: Diagnosis present

## 2023-09-19 DIAGNOSIS — K5730 Diverticulosis of large intestine without perforation or abscess without bleeding: Secondary | ICD-10-CM | POA: Diagnosis not present

## 2023-09-19 DIAGNOSIS — Z800 Family history of malignant neoplasm of digestive organs: Secondary | ICD-10-CM

## 2023-09-19 DIAGNOSIS — E872 Acidosis, unspecified: Secondary | ICD-10-CM | POA: Diagnosis not present

## 2023-09-19 DIAGNOSIS — K55069 Acute infarction of intestine, part and extent unspecified: Secondary | ICD-10-CM | POA: Diagnosis present

## 2023-09-19 DIAGNOSIS — K219 Gastro-esophageal reflux disease without esophagitis: Secondary | ICD-10-CM | POA: Diagnosis present

## 2023-09-19 DIAGNOSIS — T8189XA Other complications of procedures, not elsewhere classified, initial encounter: Principal | ICD-10-CM | POA: Diagnosis present

## 2023-09-19 DIAGNOSIS — Z86718 Personal history of other venous thrombosis and embolism: Secondary | ICD-10-CM

## 2023-09-19 DIAGNOSIS — K449 Diaphragmatic hernia without obstruction or gangrene: Secondary | ICD-10-CM | POA: Diagnosis not present

## 2023-09-19 DIAGNOSIS — Z833 Family history of diabetes mellitus: Secondary | ICD-10-CM

## 2023-09-19 DIAGNOSIS — I10 Essential (primary) hypertension: Secondary | ICD-10-CM | POA: Diagnosis present

## 2023-09-19 DIAGNOSIS — K859 Acute pancreatitis without necrosis or infection, unspecified: Secondary | ICD-10-CM | POA: Diagnosis present

## 2023-09-19 DIAGNOSIS — Z86000 Personal history of in-situ neoplasm of breast: Secondary | ICD-10-CM

## 2023-09-19 DIAGNOSIS — I82890 Acute embolism and thrombosis of other specified veins: Secondary | ICD-10-CM | POA: Diagnosis not present

## 2023-09-19 DIAGNOSIS — Z818 Family history of other mental and behavioral disorders: Secondary | ICD-10-CM

## 2023-09-19 DIAGNOSIS — Z635 Disruption of family by separation and divorce: Secondary | ICD-10-CM

## 2023-09-19 DIAGNOSIS — Z982 Presence of cerebrospinal fluid drainage device: Secondary | ICD-10-CM

## 2023-09-19 DIAGNOSIS — K8020 Calculus of gallbladder without cholecystitis without obstruction: Secondary | ICD-10-CM | POA: Diagnosis present

## 2023-09-19 LAB — COMPREHENSIVE METABOLIC PANEL
ALT: 46 U/L — ABNORMAL HIGH (ref 0–44)
AST: 55 U/L — ABNORMAL HIGH (ref 15–41)
Albumin: 3.3 g/dL — ABNORMAL LOW (ref 3.5–5.0)
Alkaline Phosphatase: 133 U/L — ABNORMAL HIGH (ref 38–126)
Anion gap: 13 (ref 5–15)
BUN: 15 mg/dL (ref 8–23)
CO2: 22 mmol/L (ref 22–32)
Calcium: 9.2 mg/dL (ref 8.9–10.3)
Chloride: 98 mmol/L (ref 98–111)
Creatinine, Ser: 2.07 mg/dL — ABNORMAL HIGH (ref 0.44–1.00)
GFR, Estimated: 26 mL/min — ABNORMAL LOW (ref >60)
Glucose, Bld: 113 mg/dL — ABNORMAL HIGH (ref 70–99)
Potassium: 3.7 mmol/L (ref 3.5–5.1)
Sodium: 133 mmol/L — ABNORMAL LOW (ref 135–145)
Total Bilirubin: 0.7 mg/dL (ref 0.0–1.2)
Total Protein: 8.1 g/dL (ref 6.5–8.1)

## 2023-09-19 LAB — URINALYSIS, ROUTINE W REFLEX MICROSCOPIC
Bilirubin Urine: NEGATIVE
Glucose, UA: NEGATIVE mg/dL
Hgb urine dipstick: NEGATIVE
Ketones, ur: NEGATIVE mg/dL
Nitrite: NEGATIVE
Protein, ur: NEGATIVE mg/dL
Specific Gravity, Urine: 1.006 (ref 1.005–1.030)
pH: 6 (ref 5.0–8.0)

## 2023-09-19 LAB — CBC
HCT: 36.2 % (ref 36.0–46.0)
Hemoglobin: 11.6 g/dL — ABNORMAL LOW (ref 12.0–15.0)
MCH: 28.6 pg (ref 26.0–34.0)
MCHC: 32 g/dL (ref 30.0–36.0)
MCV: 89.4 fL (ref 80.0–100.0)
Platelets: 339 10*3/uL (ref 150–400)
RBC: 4.05 MIL/uL (ref 3.87–5.11)
RDW: 14.4 % (ref 11.5–15.5)
WBC: 8.5 10*3/uL (ref 4.0–10.5)
nRBC: 0 % (ref 0.0–0.2)

## 2023-09-19 LAB — TROPONIN I (HIGH SENSITIVITY)
Troponin I (High Sensitivity): 6 ng/L (ref <18)
Troponin I (High Sensitivity): 6 ng/L (ref <18)

## 2023-09-19 LAB — I-STAT CG4 LACTIC ACID, ED: Lactic Acid, Venous: 0.8 mmol/L (ref 0.5–1.9)

## 2023-09-19 LAB — LIPASE, BLOOD: Lipase: 39 U/L (ref 11–51)

## 2023-09-19 MED ORDER — POLYVINYL ALCOHOL 1.4 % OP SOLN
1.0000 [drp] | Freq: Every day | OPHTHALMIC | Status: DC | PRN
  Administered 2023-09-21: 1 [drp] via OPHTHALMIC
  Filled 2023-09-19 (×2): qty 15

## 2023-09-19 MED ORDER — QUETIAPINE FUMARATE 25 MG PO TABS
50.0000 mg | ORAL_TABLET | Freq: Every day | ORAL | Status: DC
  Administered 2023-09-20 – 2023-09-21 (×2): 50 mg via ORAL
  Filled 2023-09-19 (×2): qty 2

## 2023-09-19 MED ORDER — MORPHINE SULFATE (PF) 4 MG/ML IV SOLN
4.0000 mg | Freq: Once | INTRAVENOUS | Status: AC
Start: 2023-09-19 — End: 2023-09-19
  Administered 2023-09-19: 4 mg via INTRAVENOUS
  Filled 2023-09-19: qty 1

## 2023-09-19 MED ORDER — SODIUM CHLORIDE 0.9 % IV SOLN: INTRAVENOUS | Status: DC

## 2023-09-19 MED ORDER — VENLAFAXINE HCL ER 75 MG PO CP24
75.0000 mg | ORAL_CAPSULE | Freq: Every day | ORAL | Status: DC
  Administered 2023-09-20: 75 mg via ORAL
  Filled 2023-09-19: qty 1

## 2023-09-19 MED ORDER — HEPARIN BOLUS VIA INFUSION
4000.0000 [IU] | Freq: Once | INTRAVENOUS | Status: AC
  Administered 2023-09-19: 4000 [IU] via INTRAVENOUS
  Filled 2023-09-19: qty 4000

## 2023-09-19 MED ORDER — HEPARIN (PORCINE) 25000 UT/250ML-% IV SOLN
1200.0000 [IU]/h | INTRAVENOUS | Status: AC
Start: 2023-09-19 — End: 2023-09-21
  Administered 2023-09-19: 1200 [IU]/h via INTRAVENOUS
  Administered 2023-09-20: 1150 [IU]/h via INTRAVENOUS
  Filled 2023-09-19 (×2): qty 250

## 2023-09-19 MED ORDER — FERROUS SULFATE 325 (65 FE) MG PO TABS
325.0000 mg | ORAL_TABLET | ORAL | Status: DC
  Administered 2023-09-20 – 2023-09-22 (×2): 325 mg via ORAL
  Filled 2023-09-19 (×2): qty 1

## 2023-09-19 MED ORDER — LEVOTHYROXINE SODIUM 75 MCG PO TABS
75.0000 ug | ORAL_TABLET | Freq: Every day | ORAL | Status: DC
Start: 2023-09-20 — End: 2023-09-22
  Administered 2023-09-20 – 2023-09-22 (×3): 75 ug via ORAL
  Filled 2023-09-19 (×3): qty 1

## 2023-09-19 MED ORDER — SODIUM CHLORIDE 0.9 % IV BOLUS
1000.0000 mL | Freq: Once | INTRAVENOUS | Status: AC
Start: 2023-09-19 — End: 2023-09-19
  Administered 2023-09-19: 1000 mL via INTRAVENOUS

## 2023-09-19 MED ORDER — IOHEXOL 350 MG/ML SOLN
75.0000 mL | Freq: Once | INTRAVENOUS | Status: AC | PRN
  Administered 2023-09-19: 75 mL via INTRAVENOUS

## 2023-09-19 MED ORDER — ACETAMINOPHEN 325 MG PO TABS
650.0000 mg | ORAL_TABLET | Freq: Four times a day (QID) | ORAL | Status: DC
  Administered 2023-09-19 – 2023-09-22 (×11): 650 mg via ORAL
  Filled 2023-09-19 (×12): qty 2

## 2023-09-19 MED ORDER — FENTANYL CITRATE PF 50 MCG/ML IJ SOSY
50.0000 ug | PREFILLED_SYRINGE | Freq: Once | INTRAMUSCULAR | Status: AC
Start: 2023-09-19 — End: 2023-09-19
  Administered 2023-09-19: 50 ug via INTRAVENOUS
  Filled 2023-09-19: qty 1

## 2023-09-19 MED ORDER — OXYCODONE HCL 5 MG PO TABS
5.0000 mg | ORAL_TABLET | Freq: Four times a day (QID) | ORAL | Status: DC | PRN
Start: 2023-09-19 — End: 2023-09-22
  Administered 2023-09-20 – 2023-09-22 (×4): 5 mg via ORAL
  Filled 2023-09-19 (×4): qty 1

## 2023-09-19 MED ORDER — SODIUM CHLORIDE 0.9 % IV BOLUS
1000.0000 mL | Freq: Once | INTRAVENOUS | Status: AC
  Administered 2023-09-19: 1000 mL via INTRAVENOUS

## 2023-09-19 MED ORDER — ONDANSETRON HCL 4 MG/2ML IJ SOLN
4.0000 mg | Freq: Once | INTRAMUSCULAR | Status: AC
  Administered 2023-09-19: 4 mg via INTRAVENOUS
  Filled 2023-09-19: qty 2

## 2023-09-19 NOTE — H&P (Cosign Needed Addendum)
Hospital Admission History and Physical Service Pager: [phone]  Patient name: Sherry Anderson Medical record number: [id number] Date of Birth: 1960/10/29 Age: 63 y.o. Gender: female  Primary Care Provider: Latrelle Dodrill, MD Consultants: None Code Status: Full Code  Preferred Emergency Contact:  Contact Information     Name Relation Home Work Mobile   Anderson,Sherry Brother [phone]  [phone]      Other Contacts   None on File      Chief Complaint: abdominal pain   Assessment and Plan: Sherry Anderson is a 63 y.o. female with PMHx of provoked DVT and PE, HTN, hypothyroidism, hx of breast cancer and adenocarcinoma in situ of uterine cervix presenting with three days of epigastric abdominal pain. Differential for this patient's presentation of this includes mesenteric vein thrombosis, pancreatitis, cholecystitis with less concern for ACS, aortic dissection. Given patient has history of provoked thrombosis after surgery and CT AP shows thrombus within the SMV and main portal vein, most likely explanation for patients abdominal pain. Considered acute pancreatitis given pain is epigastric in nature with radiation to the back and CT showing inflammatory changes adjacent to the pancreas, however reassuring lipase wnl. Patient does have mildly elevated liver enzymes and alk phos, concern for gallbladder pathology. CT AP did not show gallstone but gallbladder was mildly distended. Will order RUQ to rule out gallbladder pathology. Less concerned for ACS or aortic dissection given normal EKG and no chest pain or trouble breathing.   Most concerned for thrombus formation in setting of recent surgery. Patient does have history of provoked DVT with subsequent PE requiring anticoagulation after prior surgery. Less concerned for PE currently given patient is not having any shortness of breath or tachycardia. As she is already being anticoagulated, will consider CTPE if she  starts to experience worsening shortness of breath or trouble breathing. Patient also had AKI on admission with Cr elevated to 2.07 from baseline around 0.9. Given she has been on liquid diet and is eating less than usual along with her being mildly hypotensive, likely prerenal from decreased PO intake.  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 Likely in the setting of recent surgery as patient has history of provoked DVT s/p surgery. ED consulted vascular surgery who recommend anticoagulation but no surgical intervention at this time. - Admit to FMTS, Attending: Dr. Linwood Anderson - CT AP venogram pending - Continue anticoagulation with heparin per pharmacy - Will order RUQ to rule out cholecystitis - NPO except sips with meds, consider advancing to liquid diet in AM  - continue mIVF 100 ml/hour NS given softer BP and NPO status - vital signs per floor - consider GI or vascular consult in AM  - Pain regimen: Tylenol 650 q6h with oxycodone 5 mg q6h prn  AKI (acute kidney injury) (HCC) Cr elevated to 2.07 from baseline of ~0.90. Likely prerenal in the setting of decreased PO intake. - mIVF: NS @ 176ml/hr - AM CMP Transaminitis Mildly elevated liver enzymes, likely in setting of thrombus.  - RUQ ultrasound - AM CMP    Chronic and Stable Conditions: Hypothyroidism: continue synthroid 75 mcg Anxiety/Depression: continue effexor 75 mg and seroquel 50mg  at bedtime HTN: holding lisinopril and dyazide at this time given softer BP IDA: continue iron supplementation    FEN/GI: NPO except sips with meds VTE Prophylaxis: heparin per pharmacy   Disposition: Med Surg   History of Present Illness:  Sherry Anderson is a 63 y.o.  female presenting with abdominal pain for past 2-3  days after hiatal hernia repair. The pain was all over her abdomen, but primarily in lower abdomen. It also radiated to lower back. Not associated with eating. The pain is  constant. Has tried tylenol for the pain which helped a bit but eventually stopped working. Is also been feeling very weak after surgery. Is on liquid diet and has been having some liquid food,  but decreased appetite. Denies SOB or chest pain. Denies N/V/D or blood stools. Last BM was yesterday. Reports she does have history of blood clots after a surgery she had in the early 2000s that she had to inject herself with blood thinners for for a few months.   In the ED, patient initially hypotensive with SBPs in mid 80s which improved after 1Lx3 bolus. Labs showed mild anemia with Hg of 11.6, Na 133, Creatinine 2.07, AST/ALT 55/46, Alk phos 133. Lactic acid WNL. UA shows large leukocytes without nitrite and rare bacteria. CT AP with multiple findings, most notable for thrombus within the SMV and main portal vein. Also showed inflammatory changes adjacent to the pancreas, small hiatal hernia and mild diverticulosis.  Sherry Anderson from vascular was consulted in ED who recommended anticoagulation but no surgical intervention at this time.  Review Of Systems: Per HPI   Pertinent Past Medical History: Hypertension Hypothyroidism History of DVT and PE provoked by surgery Anxiety/depression Anemia History of adenocarcinoma in situ of cervix History of left breast cancer  Pertinent Past Surgical History: MVD craniotomy-2021 Breast lumpectomy-2002 Total hysterectomy with bilateral salpingo-oophorectomy-2019 Robotic assisted hiatal hernia repair-08/30/2023  Pertinent Social History: Tobacco use: No Alcohol use: No Other Substance use: No Lives with nephew and son  Pertinent Family History: Per history tab   Important Outpatient Medications: Lisinopril 20 mg Dyazide 37.5-25 mg daily Venlafaxine 225 mg  Seroquel 50 mg daily at bedtime Synthroid 75 mcg daily Ferrous sulfate 325 mg daily  Remainder reviewed in medication history.   Objective: BP 102/62   Pulse 71   Temp 98 F (36.7 C)  (Oral)   Resp 15   Ht 5\' 2"  (1.575 m)   Wt 76.2 kg   LMP  (LMP Unknown) Comment: not sexually active  SpO2 100%   BMI 30.73 kg/m  Exam: General: well appearing, NAD HEENT: atraumatic, normocephalic, MMM Cardiovascular: RRR, no m/r/g Respiratory: CTAB, NWOB on RA Gastrointestinal: soft, nondistended, TTP diffusely, most notable at epigastric and LLQ.  MSK: moves all extremities appropriately. No lower extremity edema or swelling noted.  Derm: Scars from surgery along abdomen, appear clean and dry  Neuro: A&Ox4, no focal deficits noted  Psych: appropriate mood and affect   Labs:  CBC BMET  Recent Labs  Lab 09/19/23 1246  WBC 8.5  HGB 11.6*  HCT 36.2  PLT 339   Recent Labs  Lab 09/19/23 1246  NA 133*  K 3.7  CL 98  CO2 22  BUN 15  CREATININE 2.07*  GLUCOSE 113*  CALCIUM 9.2    Pertinent additional labs:  Lipase: 39 Lactic acid: 0.8 Troponin: 6 > 6 EKG: My own interpretation (not copied from electronic read)  Normal sinus rhythm at 60 bpm without ST elevation or t wave abnormalities    Imaging Studies Performed:  CT AP WP CONTRAST:  1. Thrombus within the SMV and main portal vein. Further evaluation with CT venogram or interrogation of the portal vein with duplex ultrasound recommended. 2. Inflammatory changes adjacent to the pancreas may be related to portal  venous thrombus or represent acute pancreatitis. Correlation with pancreatic enzymes recommended. 3. Small hiatal hernia. Inflammatory changes and fluid adjacent to the distal esophagus may be related to inflammatory changes of the upper abdomen or represent esophagitis. 4. Mild distal colonic diverticulosis. No bowel obstruction. Normal appendix.  Penne Lash, MD 09/19/2023, 9:53 PM PGY-1, [facility]  FPTS Intern pager: [phone], text pages welcome Secure chat group [facility] Teaching Service

## 2023-09-19 NOTE — ED Triage Notes (Addendum)
Pt c/o RUQ, LUQ that radiates to back and Hax2-3d. Pt had abominal hernia surgery 3 wks ago. Pt denies N/V/D. Pt c/o generalized weakness since surgery

## 2023-09-19 NOTE — ED Notes (Addendum)
S/p hiatal hernia surgery x 2 weeks with no complications until yesterday when she started having abdominal pain. Denies n,v,diarrhea. No fevers.

## 2023-09-19 NOTE — ED Notes (Addendum)
Pt BP soft and O2 low. [place] at 2l applied. EDP advised of BP and at bedside. Verbal order for NS bolus per Dr. Rolland Bimler

## 2023-09-19 NOTE — ED Provider Notes (Signed)
[place] EMERGENCY DEPARTMENT AT [facility] Provider Note   CSN: [id number] Arrival date & time: 09/19/23  1203     History  Chief Complaint  Patient presents with   Abdominal Pain    Sherry Anderson is a 63 y.o. female history of hiatal hernia repair 3 weeks ago presented with upper abdominal pain for the past 3 weeks and surgery.  Patient denies any nausea vomiting diarrhea, chest pain, shortness of breath, dysuria, fevers.  Patient is taking her medications as prescribed but states that sometimes the epigastric pain goes to her back.  Patient is unsure what causes this.  Patient states she has been drinking and eating food without issue.  Home Medications Prior to Admission medications   Medication Sig Start Date End Date Taking? Authorizing Provider  Calcium Citrate-Vitamin D (CALCIUM + D PO) Take 1 tablet by mouth daily.   Yes [provider]  Carboxymethylcellulose Sodium (THERATEARS) 0.25 % SOLN Place 1 drop into both eyes daily as needed (Dry eyes).   Yes [provider]  Cyanocobalamin (B-12 PO) Take 1,000 mcg by mouth daily.   Yes [provider]  ferrous sulfate 325 (65 FE) MG tablet Take 1 tablet (325 mg total) by mouth every other day. 07/05/23  Yes Latrelle Dodrill, MD  levothyroxine (SYNTHROID) 75 MCG tablet Take 1 tablet (75 mcg total) by mouth every morning. 30 minutes before food 07/05/23  Yes Latrelle Dodrill, MD  lisinopril (ZESTRIL) 20 MG tablet Take 1 tablet (20 mg total) by mouth at bedtime. 07/05/23  Yes Latrelle Dodrill, MD  ondansetron (ZOFRAN) 4 MG tablet Take 1 tablet (4 mg total) by mouth daily as needed for nausea or vomiting. 08/31/23 08/30/24 Yes Gaynelle Adu, MD  Probiotic Product (PROBIOTIC PO) Take 1 tablet by mouth daily.   Yes [provider]  QUEtiapine (SEROQUEL) 50 MG tablet Take 1 tablet (50 mg total) by mouth at bedtime. 07/05/23  Yes Latrelle Dodrill, MD   triamterene-hydrochlorothiazide (DYAZIDE) 37.5-25 MG capsule TAKE 1 CAPSULE BY MOUTH EVERY DAY 07/05/23  Yes Latrelle Dodrill, MD  venlafaxine XR (EFFEXOR-XR) 75 MG 24 hr capsule TAKE THREE CAPSULES BY MOUTH ONCE DAILY 07/05/23  Yes Latrelle Dodrill, MD      Allergies    Patient has no known allergies.    Review of Systems   Review of Systems  Gastrointestinal:  Positive for abdominal pain.    Physical Exam Updated Vital Signs BP (!) 105/56 (BP Location: Left Arm)   Pulse 64   Temp 98.1 F (36.7 C)   Resp 16   Ht 5\' 2"  (1.575 m)   Wt 76.2 kg   LMP  (LMP Unknown) Comment: not sexually active  SpO2 100%   BMI 30.73 kg/m  Physical Exam Vitals reviewed.  Constitutional:      General: She is not in acute distress. HENT:     Head: Normocephalic and atraumatic.  Eyes:     Extraocular Movements: Extraocular movements intact.     Conjunctiva/sclera: Conjunctivae normal.     Pupils: Pupils are equal, round, and reactive to light.  Cardiovascular:     Rate and Rhythm: Normal rate and regular rhythm.     Pulses: Normal pulses.     Heart sounds: Normal heart sounds.     Comments: 2+ bilateral radial/dorsalis pedis pulses with regular rate Pulmonary:     Effort: Pulmonary effort is normal. No respiratory distress.     Breath sounds: Normal breath  sounds.  Abdominal:     Palpations: Abdomen is soft.     Tenderness: There is no abdominal tenderness. There is no guarding or rebound.  Musculoskeletal:        General: Normal range of motion.     Cervical back: Normal range of motion and neck supple.     Comments: 5 out of 5 bilateral grip/leg extension strength  Skin:    General: Skin is warm and dry.     Capillary Refill: Capillary refill takes less than 2 seconds.  Neurological:     General: No focal deficit present.     Mental Status: She is alert and oriented to person, place, and time.     Comments: Sensation intact in all 4 limbs  Psychiatric:        Mood and  Affect: Mood normal.     ED Results / Procedures / Treatments   Labs (all labs ordered are listed, but only abnormal results are displayed) Labs Reviewed  COMPREHENSIVE METABOLIC PANEL - Abnormal; Notable for the following components:      Result Value   Sodium 133 (*)    Glucose, Bld 113 (*)    Creatinine, Ser 2.07 (*)    Albumin 3.3 (*)    AST 55 (*)    ALT 46 (*)    Alkaline Phosphatase 133 (*)    GFR, Estimated 26 (*)    All other components within normal limits  CBC - Abnormal; Notable for the following components:   Hemoglobin 11.6 (*)    All other components within normal limits  LIPASE, BLOOD  URINALYSIS, ROUTINE W REFLEX MICROSCOPIC  I-STAT CG4 LACTIC ACID, ED  TROPONIN I (HIGH SENSITIVITY)  TROPONIN I (HIGH SENSITIVITY)    EKG None  Radiology No results found.  Procedures .Critical Care  Performed by: Netta Corrigan, PA-C Authorized by: Netta Corrigan, PA-C   Critical care provider statement:    Critical care time (minutes):  30   Critical care time was exclusive of:  Separately billable procedures and treating other patients   Critical care was necessary to treat or prevent imminent or life-threatening deterioration of the following conditions:  Dehydration   Critical care was time spent personally by me on the following activities:  Blood draw for specimens, development of treatment plan with patient or surrogate, ordering and performing treatments and interventions, ordering and review of laboratory studies, ordering and review of radiographic studies, pulse oximetry, re-evaluation of patient's condition, review of old charts, obtaining history from patient or surrogate, examination of patient and evaluation of patient's response to treatment   I assumed direction of critical care for this patient from another provider in my specialty: no       Medications Ordered in ED Medications  sodium chloride 0.9 % bolus 1,000 mL (0 mLs Intravenous Stopped  09/19/23 1426)    ED Course/ Medical Decision Making/ A&P Clinical Course as of 09/19/23 1502  Mon Sep 19, 2023  1457 S- hiatal hernia sx 3 weeks ago Epig abd pain since, no n/v/d BP soft, new AKI  [ ]  CT  [GG]    Clinical Course User Index [GG] Mikeal Hawthorne, MD                                 Medical Decision Making Amount and/or Complexity of Data Reviewed Radiology: ordered.   Evern Bio 63 y.o. presented today for abdominal pain.  Working DDx that I considered at this time includes, but not limited to, gastroenteritis, colitis, small bowel obstruction, appendicitis, cholecystitis, hepatobiliary pathology, gastritis, PUD, ACS, aortic dissection, diverticulosis/diverticulitis, pancreatitis, nephrolithiasis, medication induced, AAA, UTI, pyelonephritis, ruptured ectopic pregnancy, PID, ovarian torsion.  R/o DDx: Pending  Review of prior external notes: 09/13/2023 progress Notes  Unique Tests and My Independent Interpretation:  CBC with differential: Unremarkable CMP: GFR 26, transaminitis 55/46, elevated alk phos 133, creatinine 2.7 Lipase: Unremarkable Troponin: 6 Lactic acid: Pending UA: Pending EKG: Pending CT abdomen pelvis without contrast: Pending  Social Determinants of Health: none  Discussion with Independent Historian:  Husband  Discussion of Management of Tests: None  Risk: High: hospitalization or escalation of hospital-level care  Risk Stratification Score: None  Plan: On exam patient was in no acute distress was noted to be hypotensive in the 80s systolic upon arrival.  Labs are drawn from triage along with fluids given and patient did improve to 105 systolic.  Patient did not have any abdominal tenderness on exam and ultimately had reassuring physical exam.  Given previous history of hiatal hernia repair and having discomfort in the epigastric region we will get CT scan.  At this time we will withhold contrast as patient does have  significant AKI with GFR less than 30.  Do anticipate admission due to AKI at the bare minimum.  Patient's surgeon: Gaynelle Adu, MD  Patient signed out to Rolland Bimler, MD.  Please review their note for the continuation of patient's care.  The plan at this point is follow-up on labs and imaging to see why patient is hypotensive and admit for AKI at the bare minimum.  This chart was dictated using voice recognition software.  Despite best efforts to proofread,  errors can occur which can change the documentation meaning.         Final Clinical Impression(s) / ED Diagnoses Final diagnoses:  AKI (acute kidney injury) [facility])    Rx / DC Orders ED Discharge Orders     None         Remi Deter 09/19/23 1502    Gerhard Munch, MD 09/20/23 [phone]

## 2023-09-19 NOTE — Assessment & Plan Note (Signed)
Mildly elevated liver enzymes, likely in setting of thrombus.  - RUQ ultrasound - AM CMP

## 2023-09-19 NOTE — Assessment & Plan Note (Signed)
CT AP concerning for SMV thrombosis. Likely in the setting of recent surgery as patient has history of provoked DVT s/p surgery. ED consulted vascular surgery who recommend anticoagulation but no surgical intervention at this time. - Admit to FMTS, Attending: Dr. Linwood Dibbles - CT AP venogram pending - Continue anticoagulation with heparin per pharmacy - Will order RUQ to rule out cholecystitis - NPO except sips with meds, consider advancing to liquid diet in AM  - continue mIVF 100 ml/hour NS given softer BP and NPO status - vital signs per floor - consider GI or vascular consult in AM  - Pain regimen: Tylenol 650 q6h with oxycodone 5 mg q6h prn

## 2023-09-19 NOTE — Progress Notes (Signed)
ANTICOAGULATION CONSULT NOTE - Initial Consult  Pharmacy Consult for Heparin Indication:  hrombus in SMV and main portal vein  No Known Allergies  Patient Measurements: Height: 5\' 2"  (157.5 cm) Weight: 76.2 kg (167 lb 15.9 oz) IBW/kg (Calculated) : 50.1 Heparin Dosing Weight: 66.7 kg  Vital Signs: Temp: 98 F (36.7 C) (02/03 1900) Temp Source: Oral (02/03 1900) BP: 112/59 (02/03 2000) Pulse Rate: 63 (02/03 2000)  Labs: Recent Labs    09/19/23 1246 09/19/23 1447  HGB 11.6*  --   HCT 36.2  --   PLT 339  --   CREATININE 2.07*  --   TROPONINIHS 6 6    Estimated Creatinine Clearance: 26.6 mL/min (A) (by C-G formula based on SCr of 2.07 mg/dL (H)).   Medical History: 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   Associated with high-grade dysplasia   Anemia    Anxiety and depression    Arthritis    Breast cancer (HCC) 2002   left breast cancer    Cerebral aneurysm 2021   had craniotomy [facility] at [facility]   Depression    Diverticulosis of colon    DVT (deep venous thrombosis) (HCC) 2021   provoked by surgery   Essential hypertension, benign    Family history of pancreatic cancer    GERD (gastroesophageal reflux disease)    Heart murmur    systolic ejection murmur   had ECHO   Hepatitis    unsure of which type   High grade squamous intraepithelial cervical dysplasia 05/2017   Associated with adenocarcinoma in situ   History of adenomatous polyp of colon    tubular adenoma's  02-08-2017   History of gastritis    History of hiatal hernia    History of left breast cancer 2002---- per pt no recurrence   dx DCIS --- s/p  left breast lumpectomy;  per pt radiation therapy completed same year and completed antiestrogen therapy    History of radiation therapy    2002   left breast   History of trigeminal neuralgia    Hypothyroidism    Internal hemorrhoids    Pain    right ear and jaw   Personal history of radiation  therapy    Pre-diabetes    Spinal stenosis    Spondylolisthesis    SVD (spontaneous vaginal delivery)    x 1   Tremor of both hands    Tubular adenoma of colon     Medications:  (Not in a hospital admission)  Scheduled:   fentaNYL (SUBLIMAZE) injection  50 mcg Intravenous Once   Infusions:  PRN:   Assessment: 33 yof with a history of hiatal hernia repair 3 weeks ago presented with upper abdominal pain for the past 3 weeks. CT ab/pelvis w/ thrombus in SMV and main portal vein. Heparin per pharmacy consult placed.  Patient is not on anticoagulation prior to arrival.  Hgb 11.6; plt 339  Goal of Therapy:  Heparin level 0.3-0.7 units/ml Monitor platelets by anticoagulation protocol: Yes   Plan:  Give IV heparin 4000 units bolus x 1 Start heparin infusion at 1200 units/hr Check anti-Xa level in 8 hours and daily while on heparin Continue to monitor H&H and platelets  Delmar Landau, PharmD, BCPS 09/19/2023 9:27 PM ED Clinical Pharmacist -  [phone]

## 2023-09-19 NOTE — ED Provider Notes (Incomplete)
Medical Decision Making: Care of patient assumed from Evlyn Kanner, PA-C at 4:57 PM.  Agree with history.  See their note for further details.  Briefly, 63 y.o. female with PMH/PSH as below.  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   Associated with high-grade dysplasia   Anemia    Anxiety and depression    Arthritis    Breast cancer (HCC) 2002   left breast cancer    Cerebral aneurysm 2021   had craniotomy MVD at [facility]   Depression    Diverticulosis of colon    DVT (deep venous thrombosis) (HCC) 2021   provoked by surgery   Essential hypertension, benign    Family history of pancreatic cancer    GERD (gastroesophageal reflux disease)    Heart murmur    systolic ejection murmur   had ECHO   Hepatitis    unsure of which type   High grade squamous intraepithelial cervical dysplasia 05/2017   Associated with adenocarcinoma in situ   History of adenomatous polyp of colon    tubular adenoma's  02-08-2017   History of gastritis    History of hiatal hernia    History of left breast cancer 2002---- per pt no recurrence   dx DCIS --- s/p  left breast lumpectomy;  per pt radiation therapy completed same year and completed antiestrogen therapy    History of radiation therapy    2002   left breast   History of trigeminal neuralgia    Hypothyroidism    Internal hemorrhoids    Pain    right ear and jaw   Personal history of radiation therapy    Pre-diabetes    Spinal stenosis    Spondylolisthesis    SVD (spontaneous vaginal delivery)    x 1   Tremor of both hands    Tubular adenoma of colon    Past Surgical History:  Procedure Laterality Date   BRAIN SURGERY  01/25/2020   craniotomy MVD at [facility]   BREAST LUMPECTOMY Left 2002   ductal CA in situ   CERVICAL CONIZATION W/BX N/A 06/08/2017   Procedure: CONIZATION CERVIX WITH BIOPSY;  Surgeon: Dara Lords, MD;  Location: [facility];  Service:  Gynecology;  Laterality: N/A;   CESAREAN SECTION  1990   twins   COLONOSCOPY  02-08-2017   dr Philbert Riser   CRANIECTOMY  02/12/2020   VP shunt following craniotomy   DILATATION & CURETTAGE/HYSTEROSCOPY WITH MYOSURE N/A 06/08/2017   Procedure: DILATATION & CURETTAGE/HYSTEROSCOPY WITH MYOSURE;  Surgeon: Dara Lords, MD;  Location: [facility];  Service: Gynecology;  Laterality: N/A;   ESOPHAGOGASTRODUODENOSCOPY  last one 11-24-2016   dr Philbert Riser   ROBOTIC ASSISTED TOTAL HYSTERECTOMY WITH BILATERAL SALPINGO OOPHERECTOMY N/A 09/21/2017   Procedure: XI ROBOTIC ASSISTED TOTAL HYSTERECTOMY WITH BILATERAL SALPINGO OOPHORECTOMY, Lysis of Adhesions;  Surgeon: Genia Del, MD;  Location: [facility];  Service: Gynecology;  Laterality: N/A;   TOOTH EXTRACTION     TRANSFORAMINAL LUMBAR INTERBODY FUSION (TLIF) WITH PEDICLE SCREW FIXATION 2 LEVEL N/A 04/13/2018   Procedure: RIGHT-SIDED LUMBAR 4-5 AND LUMBAR 5-SACRUM 1 TRANSFORAMINAL LUMBAR INTERBODY FUSION WITH INSTRUMENTATION AND ALLOGRAFT;  Surgeon: Estill Bamberg, MD;  Location: [facility];  Service: Orthopedics;  Laterality: N/A;   UPPER GI ENDOSCOPY N/A 08/30/2023   Procedure: UPPER ENDOSCOPY;  Surgeon: Gaynelle Adu, MD;  Location: [facility];  Service: General;  Laterality: N/A;   WISDOM TOOTH EXTRACTION  XI ROBOTIC ASSISTED HIATAL HERNIA REPAIR N/A 08/30/2023   Procedure: XI ROBOTIC ASSISTED HIATAL HERNIA REPAIR;  Surgeon: Gaynelle Adu, MD;  Location: [facility];  Service: General;  Laterality: N/A;      Patient presents with 1 day of known pain after undergoing hiatal hernia repair 4 weeks ago.  Of note,, patient describes primarily epigastric/upper abdominal pain though points and appears most tender in the suprapubic region.  Notes that she had to follow modified diet in the weeks following her surgery with additional liquid diet that has gradually been advanced towards regular diet.    MDM:  -Reviewed and confirmed nursing documentation  for past medical history, family history, social history.   I evaluated patient at bedside multiple times due to continued soft blood pressures with MAP 60-65 no other vital signs within the limits.  Bedside ultrasound performed and with limited cardiac windows though no large pericardial effusion, IVC >50% collapsible so felt she would benefit for additional IV rehydration.  Additionally performed brief renal ultrasound and noted no obvious bladder distention or hydronephrosis.  Did note improvement of blood pressure after receiving additional IV fluids.  At time of handoff from previous provider patient was pending UA and noncontrast CT Abdo/pelvis.  UA with large leuks though rare bacteria similar sources for infection.  I subsequently contacted by the radiologist Dr. Edwena Bunde who noted concern for thrombus in the SMV and main portal vein on noncontrast CT.  Recommending dedicated CT venogram with delayed venous phase to better assess.  Engaged in shared decision-making with patient and husband at bedside regarding theoretical risk of contrast-induced nephropathy worsening patient's AKI, however after discussing further they congruent with proceeding with dedicated CTV.  Empiric heparin was ordered and spoke with vascular surgeon Dr. Chestine Spore who advised patient is not a candidate for any vascular interventions and recommended continuing heparin.  Patient requires admission for further management evaluation of new VTE.  CT venogram read pending at time of admission.  She will be admitted to the family medicine teaching service.  Please addend note reviewed the details.  Patient ran stable and had no acute swelling or care in the emergency department.    The plan for this patient was discussed with Dr. Jodi Mourning, who voiced agreement and who oversaw evaluation and treatment of this patient.  Mikeal Hawthorne, MD Emergency Medicine, PGY-2  Note: Dragon medical dictation software was used in the creation of  this note.   ED disposition: Admit This   Mikeal Hawthorne, MD 09/20/23 1341

## 2023-09-19 NOTE — ED Notes (Addendum)
Triage RN Alana and charge RN Asher Muir notified of low BP. Pt has restricted L extremity

## 2023-09-19 NOTE — Assessment & Plan Note (Signed)
Cr elevated to 2.07 from baseline of ~0.90. Likely prerenal in the setting of decreased PO intake. - mIVF: NS @ 150ml/hr - AM CMP

## 2023-09-19 NOTE — Progress Notes (Unsigned)
Patient in ED.  Sherry Anderson  [facility]  [facility], [facility] Guide  Direct Dial: [phone]  Fax [phone]

## 2023-09-19 NOTE — ED Provider Triage Note (Signed)
Emergency Medicine Provider Triage Evaluation Note  Sherry Anderson , a 63 y.o. female  was evaluated in triage.  Pt complains of upper abdominal pain x 2-3 days. Associated headache. Pain radiates to the middle of her back. Tried tylenol which originally helps, now is not. Had hiatal hernia surgery 3 weeks ago. Has been feeling weak since surgery.   Review of Systems  Positive: Abdominal pain, back pain, weakness, headache Negative: Chest pain, SOB  Physical Exam  BP (!) 87/60   Pulse 87   Temp 98.1 F (36.7 C)   Resp 16   Ht 5\' 2"  (1.575 m)   Wt 76.2 kg   LMP  (LMP Unknown) Comment: not sexually active  SpO2 99%   BMI 30.73 kg/m  Gen:   Awake, no distress   Resp:  Normal effort  MSK:   Moves extremities without difficulty  Other:  Well hearing laparoscopic scars in abdomen, equal pulses in all extremities  Medical Decision Making  Medically screening exam initiated at 12:51 PM.  Appropriate orders placed.  BERIT RACZKOWSKI was informed that the remainder of the evaluation will be completed by another provider, this initial triage assessment does not replace that evaluation, and the importance of remaining in the ED until their evaluation is complete.  Workup initiated. Pt very hypotensive 87/60. Charge RN made aware patient is in need of next room.    Tobi Leinweber T, PA-C 09/19/23 1252

## 2023-09-20 ENCOUNTER — Telehealth (HOSPITAL_COMMUNITY): Payer: Self-pay | Admitting: Pharmacy Technician

## 2023-09-20 ENCOUNTER — Other Ambulatory Visit (HOSPITAL_COMMUNITY): Payer: Self-pay

## 2023-09-20 ENCOUNTER — Encounter (HOSPITAL_COMMUNITY): Payer: Self-pay | Admitting: Family Medicine

## 2023-09-20 DIAGNOSIS — E872 Acidosis, unspecified: Secondary | ICD-10-CM | POA: Diagnosis not present

## 2023-09-20 DIAGNOSIS — I959 Hypotension, unspecified: Secondary | ICD-10-CM | POA: Diagnosis not present

## 2023-09-20 DIAGNOSIS — Z833 Family history of diabetes mellitus: Secondary | ICD-10-CM | POA: Diagnosis not present

## 2023-09-20 DIAGNOSIS — Z982 Presence of cerebrospinal fluid drainage device: Secondary | ICD-10-CM | POA: Diagnosis not present

## 2023-09-20 DIAGNOSIS — M1990 Unspecified osteoarthritis, unspecified site: Secondary | ICD-10-CM | POA: Diagnosis not present

## 2023-09-20 DIAGNOSIS — K8020 Calculus of gallbladder without cholecystitis without obstruction: Secondary | ICD-10-CM | POA: Diagnosis not present

## 2023-09-20 DIAGNOSIS — Z923 Personal history of irradiation: Secondary | ICD-10-CM | POA: Diagnosis not present

## 2023-09-20 DIAGNOSIS — K219 Gastro-esophageal reflux disease without esophagitis: Secondary | ICD-10-CM | POA: Diagnosis not present

## 2023-09-20 DIAGNOSIS — F32A Depression, unspecified: Secondary | ICD-10-CM | POA: Diagnosis not present

## 2023-09-20 DIAGNOSIS — I81 Portal vein thrombosis: Secondary | ICD-10-CM | POA: Diagnosis not present

## 2023-09-20 DIAGNOSIS — I82890 Acute embolism and thrombosis of other specified veins: Secondary | ICD-10-CM | POA: Diagnosis not present

## 2023-09-20 DIAGNOSIS — Z01818 Encounter for other preprocedural examination: Secondary | ICD-10-CM | POA: Diagnosis not present

## 2023-09-20 DIAGNOSIS — E8809 Other disorders of plasma-protein metabolism, not elsewhere classified: Secondary | ICD-10-CM | POA: Diagnosis not present

## 2023-09-20 DIAGNOSIS — Z8249 Family history of ischemic heart disease and other diseases of the circulatory system: Secondary | ICD-10-CM | POA: Diagnosis not present

## 2023-09-20 DIAGNOSIS — I9789 Other postprocedural complications and disorders of the circulatory system, not elsewhere classified: Secondary | ICD-10-CM | POA: Diagnosis not present

## 2023-09-20 DIAGNOSIS — R109 Unspecified abdominal pain: Secondary | ICD-10-CM | POA: Diagnosis not present

## 2023-09-20 DIAGNOSIS — E860 Dehydration: Secondary | ICD-10-CM | POA: Diagnosis not present

## 2023-09-20 DIAGNOSIS — K209 Esophagitis, unspecified without bleeding: Secondary | ICD-10-CM | POA: Diagnosis not present

## 2023-09-20 DIAGNOSIS — K55069 Acute infarction of intestine, part and extent unspecified: Secondary | ICD-10-CM | POA: Diagnosis not present

## 2023-09-20 DIAGNOSIS — K55059 Acute (reversible) ischemia of intestine, part and extent unspecified: Secondary | ICD-10-CM | POA: Diagnosis not present

## 2023-09-20 DIAGNOSIS — E785 Hyperlipidemia, unspecified: Secondary | ICD-10-CM | POA: Diagnosis not present

## 2023-09-20 DIAGNOSIS — I10 Essential (primary) hypertension: Secondary | ICD-10-CM | POA: Diagnosis not present

## 2023-09-20 DIAGNOSIS — Z7901 Long term (current) use of anticoagulants: Secondary | ICD-10-CM | POA: Diagnosis not present

## 2023-09-20 DIAGNOSIS — K859 Acute pancreatitis without necrosis or infection, unspecified: Secondary | ICD-10-CM | POA: Diagnosis present

## 2023-09-20 DIAGNOSIS — N179 Acute kidney failure, unspecified: Secondary | ICD-10-CM | POA: Diagnosis not present

## 2023-09-20 DIAGNOSIS — Z8541 Personal history of malignant neoplasm of cervix uteri: Secondary | ICD-10-CM | POA: Diagnosis not present

## 2023-09-20 DIAGNOSIS — Z800 Family history of malignant neoplasm of digestive organs: Secondary | ICD-10-CM | POA: Diagnosis not present

## 2023-09-20 DIAGNOSIS — E039 Hypothyroidism, unspecified: Secondary | ICD-10-CM | POA: Diagnosis not present

## 2023-09-20 DIAGNOSIS — R1010 Upper abdominal pain, unspecified: Secondary | ICD-10-CM | POA: Diagnosis not present

## 2023-09-20 DIAGNOSIS — Z79899 Other long term (current) drug therapy: Secondary | ICD-10-CM | POA: Diagnosis not present

## 2023-09-20 DIAGNOSIS — Z79890 Hormone replacement therapy (postmenopausal): Secondary | ICD-10-CM | POA: Diagnosis not present

## 2023-09-20 LAB — COMPREHENSIVE METABOLIC PANEL
ALT: 30 U/L (ref 0–44)
AST: 25 U/L (ref 15–41)
Albumin: 2.7 g/dL — ABNORMAL LOW (ref 3.5–5.0)
Alkaline Phosphatase: 99 U/L (ref 38–126)
Anion gap: 13 (ref 5–15)
BUN: 9 mg/dL (ref 8–23)
CO2: 21 mmol/L — ABNORMAL LOW (ref 22–32)
Calcium: 8.3 mg/dL — ABNORMAL LOW (ref 8.9–10.3)
Chloride: 103 mmol/L (ref 98–111)
Creatinine, Ser: 1.23 mg/dL — ABNORMAL HIGH (ref 0.44–1.00)
GFR, Estimated: 49 mL/min — ABNORMAL LOW (ref >60)
Glucose, Bld: 70 mg/dL (ref 70–99)
Potassium: 3.7 mmol/L (ref 3.5–5.1)
Sodium: 137 mmol/L (ref 135–145)
Total Bilirubin: 0.5 mg/dL (ref 0.0–1.2)
Total Protein: 6.4 g/dL — ABNORMAL LOW (ref 6.5–8.1)

## 2023-09-20 LAB — HIV ANTIBODY (ROUTINE TESTING W REFLEX): HIV Screen 4th Generation wRfx: NONREACTIVE

## 2023-09-20 LAB — CBC
HCT: 31.4 % — ABNORMAL LOW (ref 36.0–46.0)
Hemoglobin: 10.4 g/dL — ABNORMAL LOW (ref 12.0–15.0)
MCH: 28.9 pg (ref 26.0–34.0)
MCHC: 33.1 g/dL (ref 30.0–36.0)
MCV: 87.2 fL (ref 80.0–100.0)
Platelets: 261 10*3/uL (ref 150–400)
RBC: 3.6 MIL/uL — ABNORMAL LOW (ref 3.87–5.11)
RDW: 14.4 % (ref 11.5–15.5)
WBC: 6.6 10*3/uL (ref 4.0–10.5)
nRBC: 0 % (ref 0.0–0.2)

## 2023-09-20 LAB — LACTIC ACID, PLASMA
Lactic Acid, Venous: 1.1 mmol/L (ref 0.5–1.9)
Lactic Acid, Venous: 0.9 mmol/L (ref 0.5–1.9)

## 2023-09-20 LAB — HEPARIN LEVEL (UNFRACTIONATED)
Heparin Unfractionated: 0.65 [IU]/mL (ref 0.30–0.70)
Heparin Unfractionated: 0.7 [IU]/mL (ref 0.30–0.70)
Heparin Unfractionated: 0.68 [IU]/mL (ref 0.30–0.70)

## 2023-09-20 LAB — MAGNESIUM: Magnesium: 2.1 mg/dL (ref 1.7–2.4)

## 2023-09-20 MED ORDER — SODIUM CHLORIDE 0.9 % IV BOLUS
500.0000 mL | Freq: Once | INTRAVENOUS | Status: AC
  Administered 2023-09-20: 500 mL via INTRAVENOUS

## 2023-09-20 MED ORDER — LACTATED RINGERS IV BOLUS
1000.0000 mL | Freq: Once | INTRAVENOUS | Status: AC
  Administered 2023-09-20: 1000 mL via INTRAVENOUS

## 2023-09-20 MED ORDER — VENLAFAXINE HCL ER 75 MG PO CP24
225.0000 mg | ORAL_CAPSULE | Freq: Every day | ORAL | Status: DC
  Administered 2023-09-21 – 2023-09-22 (×2): 225 mg via ORAL
  Filled 2023-09-20 (×3): qty 1

## 2023-09-20 NOTE — Plan of Care (Signed)
  Dr. Jennefer has reviewed chart/images on this patient.   Recommend repeat lactic acid. if elevated significantly, that may push us  to be more aggressive.   If the same or only slightly elevated, plan for repeat CTA abdomen/pelvis BRTO protocol tomorrow am. Continue heparin .   IR will follow up scan tomorrow, see her again tomorrow, and go from there.  His suspicion is this is dehydration induced portal thrombus post op.   With hx of unprovoked VTE and now this, she should be seen by hematology as outpatient for hypercoagulable workup.  Raheim Beutler S Bevin Mayall PA-C 09/20/2023 3:10 PM

## 2023-09-20 NOTE — Progress Notes (Addendum)
 Daily Progress Note Intern Pager: [phone]  Patient name: Sherry Anderson Medical record number: [id number] Date of birth: 11-02-1960 Age: 63 y.o. Gender: female  Primary Care Provider: Donah Laymon PARAS, MD Consultants: Vascular surgery   Code Status: Full code   Pt Overview and Major Events to Date:  2/3: admitted, started heparin  anticoagulation, NPO + mIVF 2/4: IR consulted  Assessment and Plan:  Sherry Anderson is a 62 y.o. F who is presenting with several weeks of acute upper abdominal pain iso recent hiatal hernia repair and found to have venous thrombus involving the SMV with satellite involvement of the branches of the hepatic and splenic veins. Also found to have AKI. Pertinent PMH/PSH includes provoked PE and DVT following MVD, VP shunt placement in 2021, IDA, hypothyroidism, and HTN.   Assessment & Plan Mesenteric vein thrombosis (HCC) CT veno with evidence of diffuse thrombi involving the portal venous system, mesenteric branches, and splenic venous branches. Admission labs with mild transaminitis and imaging with mild pancreatic inflammation as well. Prior h/o provoked DVT/PE following MVD for trigeminal neuralgia and VP shunt placement in 2021. Given this history, her current presentation is c/w this repeat provoked thrombus formation given hiatal hernia repair in mid-January. Abdominal exam this morning is reassuring and overall benign.  - Consult IR for further recommendations  - Continue anticoagulation with heparin  per pharmacy - NPO except sips with meds, will consider advancing diet after consult w/ IR - continue mIVF 100 ml/hour NS given softer BP and NPO status - Vital signs per floor - Pain regimen: Tylenol  650 q6h with oxycodone  5 mg q6h prn, currently well controlled  -1L LR this AM for ongoing soft pressures, CTM   AKI (acute kidney injury) (HCC) Cr elevated to 2.07 from baseline of ~0.90 on admission. Likely prerenal in the setting of  decreased PO intake from pain and CLD after recent surgery. Improved after initiation of fluids.  - mIVF: NS @ 100ml/hr - monitor CMP, Mg  Transaminitis Mildly elevated liver enzymes, likely 2/2 thrombus given findings on CT venogram. RUQ US  with calculi of the gallbladder neck but no other findings concerning of acute cholecystitis or choledocholithiasis that would indicate this as a cause of her transaminitis.   - monitor CMP  - Low threshold to consult surgery should clinical status change  Acute pancreatitis Given admission findings of peripancreatic stranding on imaging and typical epigastric pain, pt technically meets [place] criteria for acute pancreatitis. Reassuringly, lipase wnl. Given findings on RUQ US  of impacted calculi, this could represent underlying etiology of gallstone pancreatitis vs sequelae of underlying thrombotic event.  - continue with mIVF - management as above   FEN/GI: mIVF 100ml/hr, NPO (sips w/ meds) for now PPx: anticoagulated on heparin   Dispo: Likely  home in the next few days   pending IR recommendations . Barriers include pending anticoagulation plan and diet advancement.   Subjective:  NAEON. Pain is much improved this morning, and she is without any pain at rest. No other new symptoms and she denies nausea, vomiting, acute sources of bleeding, chest pain, shortness of breath, leg pain. She is not a smoker. No Fhx of hypercoagulability.   Objective: Temp:  [98 F (36.7 C)-98.3 F (36.8 C)] 98 F (36.7 C) (02/04 0819) Pulse Rate:  [63-87] 64 (02/04 0819) Resp:  [14-22] 18 (02/04 0819) BP: (85-113)/(45-96) 97/56 (02/04 0819) SpO2:  [96 %-100 %] 97 % (02/04 0819) Weight:  [76.2 kg] 76.2 kg (02/03 1233)  Physical Exam:  General: No acute distress. Resting comfortably in bed on exam.  HEENT: No scleral icterus visualized.  Cardiovascular: Normal rate and rhythm. Normal S1/S2. Faint systolic ejection murmur. No rubs, or gallops.  Respiratory: Breathing  comfortably on room air. Good air movement throughout without wheezes, rales, or rhonchi.  Abdomen: Good BS throughout. Mildly tender to palpation throughout the abdomen. No rebound tenderness. There are several well-healing scars consistent with prior port sites from recent surgery.  Extremities: Warm and well perfused. No lower extremity swelling.   Laboratory: Most recent CBC Lab Results  Component Value Date   WBC 6.6 09/20/2023   HGB 10.4 (L) 09/20/2023   HCT 31.4 (L) 09/20/2023   MCV 87.2 09/20/2023   PLT 261 09/20/2023   Most recent BMP    Latest Ref Rng & Units 09/20/2023    5:36 AM  BMP  Glucose 70 - 99 mg/dL 70   BUN 8 - 23 mg/dL 9   Creatinine 9.55 - 8.99 mg/dL 8.76   Sodium 864 - 854 mmol/L 137   Potassium 3.5 - 5.1 mmol/L 3.7   Chloride 98 - 111 mmol/L 103   CO2 22 - 32 mmol/L 21   Calcium  8.9 - 10.3 mg/dL 8.3     Other pertinent labs: lipase wnl, lactic wnl, flat trops, HIV NR    Imaging/Diagnostic Tests: Radiologist Impression: CT veno with diffuse thrombosis of portal veins, superior mesenteric vein, mesenteric vein branches, and in splenic vein; no evidence of bowel wall thickening or ischemia; esophageal hiatal hernia with paraesophageal fluid, possibly inflammatory vs esophagitis  My interpretation: Same, esophageal findings likely inflammatory given overall clinical picture   Drena Fallow, Medical Student 09/20/2023, 9:21 AM  [facility] FPTS Intern pager: [phone], text pages welcome Secure chat group [facility] Teaching Service    FPTS Upper-Level Resident Addendum   I have independently interviewed and examined the patient. I have discussed the above with [facility] and agree with the documented plan. My edits for correction/addition/clarification are included above. Please see any attending notes.   Payton Coward, MD PGY-2, [facility] Family Medicine 09/20/2023 12:36 PM  FPTS Service pager: [phone] (text pages  welcome through AMION)

## 2023-09-20 NOTE — Progress Notes (Addendum)
 ANTICOAGULATION CONSULT NOTE  Pharmacy Consult for Heparin  Indication:  thrombus in SMV and main portal vein  No Known Allergies  Patient Measurements: Height: 5' 2 (157.5 cm) Weight: 76.2 kg (167 lb 15.9 oz) IBW/kg (Calculated) : 50.1 Heparin  Dosing Weight: 66.7 kg  Vital Signs: Temp: 98 F (36.7 C) (02/04 0819) Temp Source: Oral (02/04 0819) BP: 93/55 (02/04 1002) Pulse Rate: 59 (02/04 1002)  Labs: Recent Labs    09/19/23 1246 09/19/23 1447 09/20/23 0536 09/20/23 1152  HGB 11.6*  --  10.4*  --   HCT 36.2  --  31.4*  --   PLT 339  --  261  --   HEPARINUNFRC  --   --  0.65 0.70  CREATININE 2.07*  --  1.23*  --   TROPONINIHS 6 6  --   --     Estimated Creatinine Clearance: 44.7 mL/min (A) (by C-G formula based on SCr of 1.23 mg/dL (H)).   Assessment: 31 yof with a history of hiatal hernia repair 3 weeks ago presented with upper abdominal pain for the past 3 weeks. CT ab/pelvis w/ thrombus in SMV and main portal vein. Heparin  per pharmacy consult placed.  Patient is not on anticoagulation prior to arrival. Hgb 11.6; plt 339  -confirmatory heparin  level 0.70, upper end of therapeutic range 1200 units/hr -Per RN, no signs/symptoms of bleeding or issues with heparin  infusion -CBC this morning - 11.6 > 10.4 and plts 331 > 261  Goal of Therapy:  Heparin  level 0.3-0.7 units/ml Monitor platelets by anticoagulation protocol: Yes   Plan:  Decrease heparin  infusion slightly to 1150 units/hr Check confirmatory anti-Xa level in 6 hours and daily while on heparin  Continue to monitor H&H and platelets  Maurilio Fila, PharmD Clinical Pharmacist 09/20/2023  1:29 PM

## 2023-09-20 NOTE — Assessment & Plan Note (Addendum)
 CT veno with evidence of diffuse thrombi involving the portal venous system, mesenteric branches, and splenic venous branches. Admission labs with mild transaminitis and imaging with mild pancreatic inflammation as well. Prior h/o provoked DVT/PE following MVD for trigeminal neuralgia and VP shunt placement in 2021. Given this history, her current presentation is c/w this repeat provoked thrombus formation given hiatal hernia repair in mid-January. Abdominal exam this morning is reassuring and overall benign.  - Consult IR for further recommendations  - Continue anticoagulation with heparin  per pharmacy - NPO except sips with meds, will consider advancing diet after consult w/ IR - continue mIVF 100 ml/hour NS given softer BP and NPO status - Vital signs per floor - Pain regimen: Tylenol  650 q6h with oxycodone  5 mg q6h prn, currently well controlled  -1L LR this AM for ongoing soft pressures, CTM

## 2023-09-20 NOTE — Progress Notes (Signed)
 ANTICOAGULATION CONSULT NOTE - Initial Consult  Pharmacy Consult for Heparin  Indication:  thrombus in SMV and main portal vein  No Known Allergies  Patient Measurements: Height: 5' 2 (157.5 cm) Weight: 76.2 kg (167 lb 15.9 oz) IBW/kg (Calculated) : 50.1 Heparin  Dosing Weight: 66.7 kg  Vital Signs: Temp: 98 F (36.7 C) (02/04 0422) Temp Source: Oral (02/04 0422) BP: 96/52 (02/04 0422) Pulse Rate: 64 (02/04 0422)  Labs: Recent Labs    09/19/23 1246 09/19/23 1447 09/20/23 0536  HGB 11.6*  --  10.4*  HCT 36.2  --  31.4*  PLT 339  --  261  HEPARINUNFRC  --   --  0.65  CREATININE 2.07*  --  1.23*  TROPONINIHS 6 6  --     Estimated Creatinine Clearance: 44.7 mL/min (A) (by C-G formula based on SCr of 1.23 mg/dL (H)).   Assessment: 35 yof with a history of hiatal hernia repair 3 weeks ago presented with upper abdominal pain for the past 3 weeks. CT ab/pelvis w/ thrombus in SMV and main portal vein. Heparin  per pharmacy consult placed.  Patient is not on anticoagulation prior to arrival. Hgb 11.6; plt 339  -Heparin  level 0.65 on 1200 units/hr (therapeutic). Per RN, no siugns/symptoms of bleeding or issues with heparin  infusion. CBC shows 11.6 > 10.4 and plts 331 > 261  Goal of Therapy:  Heparin  level 0.3-0.7 units/ml Monitor platelets by anticoagulation protocol: Yes   Plan:  Continue heparin  infusion at 1200 units/hr Check confirmatory anti-Xa level in 8 hours and daily while on heparin  Continue to monitor H&H and platelets  Lynwood Poplar, PharmD. Clinical Pharmacist 09/20/2023 6:44 AM

## 2023-09-20 NOTE — Plan of Care (Signed)

## 2023-09-20 NOTE — Progress Notes (Signed)
Responded to consult for IV. On arrival, pt not in room.

## 2023-09-20 NOTE — Progress Notes (Signed)
 ANTICOAGULATION CONSULT NOTE  Pharmacy Consult for Heparin  Indication:  thrombus in SMV and main portal vein  No Known Allergies  Patient Measurements: Height: 5' 2 (157.5 cm) Weight: 76.2 kg (167 lb 15.9 oz) IBW/kg (Calculated) : 50.1 Heparin  Dosing Weight: 66.7 kg  Vital Signs: Temp: 98 F (36.7 C) (02/04 1754) Temp Source: Oral (02/04 1754) BP: 111/58 (02/04 1754) Pulse Rate: 60 (02/04 1754)  Labs: Recent Labs    09/19/23 1246 09/19/23 1447 09/20/23 0536 09/20/23 1152 09/20/23 1954  HGB 11.6*  --  10.4*  --   --   HCT 36.2  --  31.4*  --   --   PLT 339  --  261  --   --   HEPARINUNFRC  --   --  0.65 0.70 0.68  CREATININE 2.07*  --  1.23*  --   --   TROPONINIHS 6 6  --   --   --     Estimated Creatinine Clearance: 44.7 mL/min (A) (by C-G formula based on SCr of 1.23 mg/dL (H)).   Assessment: 47 yof with a history of hiatal hernia repair 3 weeks ago presented with upper abdominal pain for the past 3 weeks. CT ab/pelvis w/ thrombus in SMV and main portal vein. Heparin  per pharmacy consult placed.  Heparin  level therapeutic and high normal; no bleeding reported.  Goal of Therapy:  Heparin  level 0.3-0.7 units/ml Monitor platelets by anticoagulation protocol: Yes   Plan:  Decrease heparin  infusion slightly to 1100 units/hr F/U AM labs  Latrece Nitta D. Lendell, PharmD, BCPS, BCCCP 09/20/2023, 8:31 PM

## 2023-09-20 NOTE — Assessment & Plan Note (Addendum)
 Given admission findings of peripancreatic stranding on imaging and typical epigastric pain, pt technically meets [place] criteria for acute pancreatitis. Reassuringly, lipase wnl. Given findings on RUQ US  of impacted calculi, this could represent underlying etiology of gallstone pancreatitis vs sequelae of underlying thrombotic event.  - continue with mIVF - management as above

## 2023-09-20 NOTE — ED Notes (Addendum)
   09/20/23 0018  Hand-off documentation  Hand-off Received Ready to receive patient from the ED  Report received from (Full Name) ED RN   Pt received from the ED with Heparin  infusing at 12 ml/hr. Pt assessed for signs of bleeding and provided pt education on heparin . Son and brother are currently at the bedside.

## 2023-09-20 NOTE — Plan of Care (Cosign Needed)
 FMTS Interim Progress Note  Discussed case with vascular surgery attending (Dr Pearline) regarding plan of care for this patient. They recommended consultation with IR for any further interventions and anticoagulation plan as vascular team does not manage mesenteric venous thromboses.   Drena Fallow, Medical Student 09/20/2023, 9:14 AM [facility] Family Medicine Service pager [phone]

## 2023-09-20 NOTE — Telephone Encounter (Signed)
 Patient Product/process development scientist completed.    The patient is insured through [employer]. Patient has Medicare and is not eligible for a copay card, but may be able to apply for patient assistance or Medicare RX Payment Plan (Patient Must reach out to their plan, if eligible for payment plan), if available.    Ran test claim for Eliquis Starter Pack and the current 30 day co-pay is $0.00.   This test claim was processed through [facility]- copay amounts may vary at other pharmacies due to pharmacy/plan contracts, or as the patient moves through the different stages of their insurance plan.     Roland Earl, CPHT Pharmacy Technician III Certified Patient Advocate [facility] Patient Advocate Team Direct Number: [phone]  Fax: [phone]

## 2023-09-20 NOTE — Assessment & Plan Note (Addendum)
Cr elevated to 2.07 from baseline of ~0.90 on admission. Likely prerenal in the setting of decreased PO intake from pain and CLD after recent surgery. Improved after initiation of fluids.  - mIVF: NS @ 162ml/hr - monitor CMP, Mg

## 2023-09-20 NOTE — Discharge Instructions (Addendum)
 Dear Sherry Anderson,  Thank you for letting us  participate in your care. 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 You were treated with anti-clotting medications. You were evaluated by the interventional radiology team who recommended starting anticoagulant medication called Eliquis  and following up closely in their clinic.  POST-HOSPITAL & CARE INSTRUCTIONS Please review your updated medication list for new medications and changes. Go to your follow up appointments (listed below)   DOCTOR'S APPOINTMENT   Future Appointments  Date Time Provider Department Center  09/26/2023 10:10 AM Donah Laymon PARAS, MD [facility]  10/17/2023  8:40 AM [facility] ANNUAL WELLNESS VISIT [facility]    Follow-up Information     [facility] Follow up.   Why: Please follow up with Dr. Jennefer with Interventional Radiology in one month to assess portal vein thrombus. A scheduler from our clinic will call you with a date/time of your appointment. Contact information: [address] [place] [address] [phone]                 Take care and be well!  Family Medicine Teaching Service Inpatient Team [place]  [facility]  [address] [place], [place] [address] [phone]      Information on my medicine - ELIQUIS  (apixaban )   Why was Eliquis  prescribed for you? Eliquis  was prescribed for you to reduce the risk of forming blood clots that can cause a stroke if you have a medical condition called atrial fibrillation (a type of irregular heartbeat) OR to reduce the risk of a blood clots forming after orthopedic surgery.  What do You need to know about Eliquis  ? Take your Eliquis  TWICE DAILY - one tablet in the morning and one tablet in the evening with or without food.  It would be best to take the doses about the same time each day.  If you have difficulty swallowing the tablet whole please discuss with  your pharmacist how to take the medication safely.  Take Eliquis  exactly as prescribed by your doctor and DO NOT stop taking Eliquis  without talking to the doctor who prescribed the medication.  Stopping may increase your risk of developing a new clot or stroke.  Refill your prescription before you run out.  After discharge, you should have regular check-up appointments with your healthcare provider that is prescribing your Eliquis .  In the future your dose may need to be changed if your kidney function or weight changes by a significant amount or as you get older.  What do you do if you miss a dose? If you miss a dose, take it as soon as you remember on the same day and resume taking twice daily.  Do not take more than one dose of ELIQUIS  at the same time.  Important Safety Information A possible side effect of Eliquis  is bleeding. You should call your healthcare provider right away if you experience any of the following: Bleeding from an injury or your nose that does not stop. Unusual colored urine (red or dark brown) or unusual colored stools (red or black). Unusual bruising for unknown reasons. A serious fall or if you hit your head (even if there is no bleeding).  Some medicines may interact with Eliquis  and might increase your risk of bleeding or clotting while on Eliquis . To help avoid this, consult your healthcare provider or pharmacist prior to using any new prescription or non-prescription medications, including herbals, vitamins, non-steroidal anti-inflammatory drugs (NSAIDs) and supplements.  This website has more information on Eliquis  (apixaban ): [email or web address] .[email or web address] Sherry Anderson

## 2023-09-20 NOTE — Assessment & Plan Note (Addendum)
 Mildly elevated liver enzymes, likely 2/2 thrombus given findings on CT venogram. RUQ US  with calculi of the gallbladder neck but no other findings concerning of acute cholecystitis or choledocholithiasis that would indicate this as a cause of her transaminitis.   - monitor CMP  - Low threshold to consult surgery should clinical status change

## 2023-09-20 NOTE — Consult Note (Addendum)
 Chief Complaint: Patient was seen in consultation today for mesenteric venous thrombus--for possible IR procedure for treatment Chief Complaint  Patient presents with   Abdominal Pain   at the request of Dr Pearline   Supervising Physician: Jennefer Rover  Patient Status: [facility] - In-pt  History of Present Illness: Sherry Anderson is a 63 y.o. female   FULL Code status per pt  Pt with hx of provoked PE/DVT post MVD for trigeminal neuralgia and VP shunt 2021 Was treated with Lovenox  inj per pt x 3-6 mo Has had no treatment since Has had no clots/symptoms since 2021 Had recent Hiatal hernia repair Aug 30, 2023 Was able to progress diet-- no pain Developed abd pain x 3 days now Pain worsening Came to ED Denies any bleeding; no hematuria; no melana Denies leg swelling or SOB  CT yesterday:  IMPRESSION: 1. Thrombus within the SMV and main portal vein. Further evaluation with CT venogram or interrogation of the portal vein with duplex ultrasound recommended. 2. Inflammatory changes adjacent to the pancreas may be related to portal venous thrombus or represent acute pancreatitis. Correlation with pancreatic enzymes recommended. 3. Small hiatal hernia. Inflammatory changes and fluid adjacent to the distal esophagus may be related to inflammatory changes of the upper abdomen or represent esophagitis. 4. Mild distal colonic diverticulosis. No bowel obstruction. Normal appendix.   US  yesterday:  IMPRESSION: 1. Diffuse thrombosis demonstrated in the portal veins, superior mesenteric vein, mesenteric vein branches, and in the splenic vein. There is associated edema/stranding. 2. Peripancreatic stranding likely arises from the venous thrombosis but could indicate early acute pancreatitis. No abscess. 3. Esophageal hiatal hernia with paraesophageal fluid, possibly inflammatory reaction or esophagitis. 4. Decompression of small bowel limits evaluation but there is no evidence  of bowel wall thickening or ischemia  Pt on heparin  drip now--- does feel better  Request made for IR to evaluate and determine if can offer assistance/procedure   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   Associated with high-grade dysplasia   Anemia    Anxiety and depression    Arthritis    Breast cancer (HCC) 2002   left breast cancer    Cerebral aneurysm 2021   had craniotomy MVD at [facility]   Depression    Diverticulosis of colon    DVT (deep venous thrombosis) (HCC) 2021   provoked by surgery   Essential hypertension, benign    Family history of pancreatic cancer    GERD (gastroesophageal reflux disease)    Heart murmur    systolic ejection murmur   had ECHO   Hepatitis    unsure of which type   High grade squamous intraepithelial cervical dysplasia 05/2017   Associated with adenocarcinoma in situ   History of adenomatous polyp of colon    tubular adenoma's  02-08-2017   History of gastritis    History of hiatal hernia    History of left breast cancer 2002---- per pt no recurrence   dx DCIS --- s/p  left breast lumpectomy;  per pt radiation therapy completed same year and completed antiestrogen therapy    History of radiation therapy    2002   left breast   History of trigeminal neuralgia    Hypothyroidism    Internal hemorrhoids    Pain    right ear and jaw   Personal history of radiation therapy    Pre-diabetes    Spinal stenosis    Spondylolisthesis  SVD (spontaneous vaginal delivery)    x 1   Tremor of both hands    Tubular adenoma of colon     Past Surgical History:  Procedure Laterality Date   BRAIN SURGERY  01/25/2020   craniotomy M[facility] at [facility]   BREAST LUMPECTOMY Left 2002   ductal CA in situ   CERVICAL CONIZATION W/BX N/A 06/08/2017   Procedure: CONIZATION CERVIX WITH BIOPSY;  Surgeon: Rockney Evalene SQUIBB, MD;  Location: [facility];  Service: Gynecology;  Laterality:  N/A;   CESAREAN SECTION  1990   twins   COLONOSCOPY  02-08-2017   dr gustav   CRANIECTOMY  02/12/2020   VP shunt following craniotomy   DILATATION & CURETTAGE/HYSTEROSCOPY WITH MYOSURE N/A 06/08/2017   Procedure: DILATATION & CURETTAGE/HYSTEROSCOPY WITH MYOSURE;  Surgeon: Rockney Evalene SQUIBB, MD;  Location: [facility];  Service: Gynecology;  Laterality: N/A;   ESOPHAGOGASTRODUODENOSCOPY  last one 11-24-2016   dr gustav   ROBOTIC ASSISTED TOTAL HYSTERECTOMY WITH BILATERAL SALPINGO OOPHERECTOMY N/A 09/21/2017   Procedure: XI ROBOTIC ASSISTED TOTAL HYSTERECTOMY WITH BILATERAL SALPINGO OOPHORECTOMY, Lysis of Adhesions;  Surgeon: Lavoie, Marie-Lyne, MD;  Location: [facility];  Service: Gynecology;  Laterality: N/A;   TOOTH EXTRACTION     TRANSFORAMINAL LUMBAR INTERBODY FUSION (TLIF) WITH PEDICLE SCREW FIXATION 2 LEVEL N/A 04/13/2018   Procedure: RIGHT-SIDED LUMBAR 4-5 AND LUMBAR 5-SACRUM 1 TRANSFORAMINAL LUMBAR INTERBODY FUSION WITH INSTRUMENTATION AND ALLOGRAFT;  Surgeon: Beuford Anes, MD;  Location: [facility];  Service: Orthopedics;  Laterality: N/A;   UPPER GI ENDOSCOPY N/A 08/30/2023   Procedure: UPPER ENDOSCOPY;  Surgeon: Tanda Locus, MD;  Location: [facility];  Service: General;  Laterality: N/A;   WISDOM TOOTH EXTRACTION     XI ROBOTIC ASSISTED HIATAL HERNIA REPAIR N/A 08/30/2023   Procedure: XI ROBOTIC ASSISTED HIATAL HERNIA REPAIR;  Surgeon: Tanda Locus, MD;  Location: [facility];  Service: General;  Laterality: N/A;    Allergies: Patient has no known allergies.  Medications: Prior to Admission medications   Medication Sig Start Date End Date Taking? Authorizing Provider  Calcium  Citrate-Vitamin D  (CALCIUM  + D PO) Take 1 tablet by mouth daily.   Yes [provider]  Carboxymethylcellulose Sodium (THERATEARS) 0.25 % SOLN Place 1 drop into both eyes daily as needed (Dry eyes).   Yes [provider]  Cyanocobalamin  (B-12 PO) Take 1,000 mcg by mouth daily.   Yes  [provider]  ferrous sulfate  325 (65 FE) MG tablet Take 1 tablet (325 mg total) by mouth every other day. 07/05/23  Yes Donah Laymon PARAS, MD  levothyroxine  (SYNTHROID ) 75 MCG tablet Take 1 tablet (75 mcg total) by mouth every morning. 30 minutes before food 07/05/23  Yes Donah Laymon PARAS, MD  lisinopril  (ZESTRIL ) 20 MG tablet Take 1 tablet (20 mg total) by mouth at bedtime. 07/05/23  Yes Donah Laymon PARAS, MD  ondansetron  (ZOFRAN ) 4 MG tablet Take 1 tablet (4 mg total) by mouth daily as needed for nausea or vomiting. 08/31/23 08/30/24 Yes Tanda Locus, MD  Probiotic Product (PROBIOTIC PO) Take 1 tablet by mouth daily.   Yes [provider]  QUEtiapine  (SEROQUEL ) 50 MG tablet Take 1 tablet (50 mg total) by mouth at bedtime. 07/05/23  Yes Donah Laymon PARAS, MD  triamterene -hydrochlorothiazide  (DYAZIDE ) 37.5-25 MG capsule TAKE 1 CAPSULE BY MOUTH EVERY DAY 07/05/23  Yes Donah Laymon PARAS, MD  venlafaxine  XR (EFFEXOR -XR) 75 MG 24 hr capsule TAKE THREE CAPSULES BY MOUTH ONCE DAILY 07/05/23  Yes Donah Brittany  J, MD     Family History  Problem Relation Age of Onset   Diabetes Mother    Hypertension Mother    Hyperlipidemia Father    Heart disease Father        Pacemaker   Dementia Father    Thyroid  disease Sister    Schizophrenia Brother    Pancreatic cancer Cousin 46    Social History   Socioeconomic History   Marital status: Legally Separated    Spouse name: Not on file   Number of children: 3   Years of education: 6   Highest education level: 6th grade  Occupational History   Occupation: Disability  Tobacco Use   Smoking status: Never    Passive exposure: Never   Smokeless tobacco: Never  Vaping Use   Vaping status: Never Used  Substance and Sexual Activity   Alcohol  use: No   Drug use: No   Sexual activity: Not Currently    Partners: Male    Birth control/protection: Surgical    Comment: 1st intercourse 63 yo-Fewer than 5 partners,  hysterectomy  Other Topics Concern   Not on file  Social History Narrative   Moved from [place] to [place]  1993, to [place] 1998   Lives at home with her nephew and her son.   Right-handed.   Occasional use of caffeine.   Social Drivers of Health   Financial Resource Strain: Medium Risk (07/01/2023)   Overall Financial Resource Strain (CARDIA)    Difficulty of Paying Living Expenses: Somewhat hard  Food Insecurity: No Food Insecurity (09/20/2023)   Hunger Vital Sign    Worried About Running Out of Food in the Last Year: Never true    Ran Out of Food in the Last Year: Never true  Recent Concern: Food Insecurity - Food Insecurity Present (07/01/2023)   Hunger Vital Sign    Worried About Running Out of Food in the Last Year: Often true    Ran Out of Food in the Last Year: Sometimes true  Transportation Needs: No Transportation Needs (09/20/2023)   PRAPARE - Administrator, Civil Service (Medical): No    Lack of Transportation (Non-Medical): No  Physical Activity: Insufficiently Active (07/01/2023)   Exercise Vital Sign    Days of Exercise per Week: 7 days    Minutes of Exercise per Session: 20 min  Stress: Stress Concern Present (07/01/2023)   [employer] of Occupational Health - Occupational Stress Questionnaire    Feeling of Stress : To some extent  Social Connections: Socially Isolated (09/20/2023)   Social Connection and Isolation Panel [NHANES]    Frequency of Communication with Friends and Family: Never    Frequency of Social Gatherings with Friends and Family: Never    Attends Religious Services: Never    Database Administrator or Organizations: Yes    Attends Engineer, Structural: 1 to 4 times per year    Marital Status: Never married    Review of Systems: A 12 point ROS discussed and pertinent positives are indicated in the HPI above.  All other systems are negative.  Review of Systems  Constitutional:  Positive for activity change. Negative for  fatigue and fever.  Respiratory:  Negative for cough and shortness of breath.   Cardiovascular:  Negative for chest pain.  Gastrointestinal:  Positive for abdominal pain. Negative for nausea.  Musculoskeletal:  Negative for back pain.  Neurological:  Positive for weakness.  Psychiatric/Behavioral:  Negative for behavioral problems and confusion.  Vital Signs: BP (!) 93/55   Pulse (!) 59   Temp 98 F (36.7 C) (Oral)   Resp 18   Ht 5' 2 (1.575 m)   Wt 167 lb 15.9 oz (76.2 kg)   LMP  (LMP Unknown) Comment: not sexually active  SpO2 97%   BMI 30.73 kg/m     Physical Exam Vitals reviewed.  Cardiovascular:     Rate and Rhythm: Normal rate and regular rhythm.  Pulmonary:     Effort: Pulmonary effort is normal.     Breath sounds: Normal breath sounds. No wheezing.  Abdominal:     Tenderness: There is abdominal tenderness.  Skin:    General: Skin is warm.  Neurological:     Mental Status: She is alert and oriented to person, place, and time.  Psychiatric:        Behavior: Behavior normal.     Imaging: US  Abdomen Limited RUQ (LIVER/GB) Result Date: 09/20/2023 CLINICAL DATA:  Abdominal pain, portal vein thrombosis EXAM: ULTRASOUND ABDOMEN LIMITED RIGHT UPPER QUADRANT COMPARISON:  CT 10:41 p.m. FINDINGS: Gallbladder: Impacted 2.7 cm calculus is seen within the gallbladder neck. The gallbladder, however, is not distended, there is no gallbladder wall thickening, and no pericholecystic fluid is identified. The sonographic Beverley sign is reportedly negative. Common bile duct: Diameter: 5 mm in proximal diameter Liver: Hepatic parenchymal echogenicity is mildly heterogeneous with areas of a geographic hyperechogenicity surrounding thrombosed intrahepatic portal segments. No discrete solid masses are identified. Overall liver size is within limits. A 16 mm simple cyst is seen the subserosal left lobe. The main and central intrahepatic portal vein is thrombosed Other: None. IMPRESSION:  1. Thrombosed main and central intrahepatic portal vein. 2. Impacted 2.7 cm calculus within the gallbladder neck. No sonographic evidence of acute cholecystitis. Electronically Signed   By: Dorethia Molt M.D.   On: 09/20/2023 00:15   CT VENOGRAM ABDOMEN PELVIS Result Date: 09/19/2023 CLINICAL DATA:  SMV and hepatic vein thrombosis.  Abdominal pain. EXAM: CT VENOGRAM ABDOMEN AND PELVIS TECHNIQUE: Portal venous and Venographic phase images of the abdomen and pelvis were obtained following the administration of intravenous contrast. Multiplanar reformats and maximum intensity projections were generated. RADIATION DOSE REDUCTION: This exam was performed according to the departmental dose-optimization program which includes automated exposure control, adjustment of the mA and/or kV according to patient size and/or use of iterative reconstruction technique. CONTRAST:  75mL OMNIPAQUE  IOHEXOL  350 MG/ML SOLN COMPARISON:  09/19/2023 FINDINGS: Venous: As demonstrated on prior noncontrast CT, there is diffuse thrombosis of the portal veins involving right and left portal veins and main portal vein. Venous thrombosis extends into the superior mesenteric vein and into several smaller mesenteric branch veins. Venous thrombosis also involves the splenic vein. There is associated soft tissue edema/stranding around the mesenteric and portal veins. Lower chest: Mild linear scarring in the lung bases. Small esophageal hiatal hernia. Nonspecific edema around the hiatal hernia. This may represent esophagitis or other inflammatory process. Hepatobiliary: Left lobe liver cyst measuring 1.6 cm diameter. No imaging follow-up is indicated. Hepatic veins are patent. Gallbladder and bile ducts are normal. Pancreas: Mild edema and stranding around the pancreas. This is likely related to the mesenteric/portal venous thrombosis but could indicate early changes of acute pancreatitis. Normal homogeneous enhancement pancreatic parenchyma. No  loculated collection or ductal dilatation. Spleen: Normal-size.  No focal lesions. Adrenals/Urinary Tract: No adrenal gland nodules. Kidneys are symmetrical. Nephrograms are homogeneous. No hydronephrosis or hydroureter. Bladder is normal. Stomach/Bowel: Stomach, small bowel, and colon are not  abnormally distended. No wall thickening or pneumatosis. No mesenteric stranding. Appendix is normal. Vascular/Lymphatic: Normal caliber abdominal aorta. No aneurysm or dissection. No significant lymphadenopathy. Other: Ventricular peritoneal shunt tubing terminates in the pelvis. No free air or free fluid in the abdomen. Abdominal wall musculature appears intact. Musculoskeletal: Postoperative fixation of the lower lumbosacral spine. No acute bony abnormalities. IMPRESSION: 1. Diffuse thrombosis demonstrated in the portal veins, superior mesenteric vein, mesenteric vein branches, and in the splenic vein. There is associated edema/stranding. 2. Peripancreatic stranding likely arises from the venous thrombosis but could indicate early acute pancreatitis. No abscess. 3. Esophageal hiatal hernia with paraesophageal fluid, possibly inflammatory reaction or esophagitis. 4. Decompression of small bowel limits evaluation but there is no evidence of bowel wall thickening or ischemia. Electronically Signed   By: Elsie Gravely M.D.   On: 09/19/2023 23:26   CT ABDOMEN PELVIS WO CONTRAST Result Date: 09/19/2023 CLINICAL DATA:  Upper abdominal pain radiating to the back. Suspected hiatal hernia. EXAM: CT ABDOMEN AND PELVIS WITHOUT CONTRAST TECHNIQUE: Multidetector CT imaging of the abdomen and pelvis was performed following the standard protocol without IV contrast. RADIATION DOSE REDUCTION: This exam was performed according to the departmental dose-optimization program which includes automated exposure control, adjustment of the mA and/or kV according to patient size and/or use of iterative reconstruction technique. COMPARISON:  CT  abdomen pelvis dated 06/05/2023. FINDINGS: Evaluation of this exam is limited in the absence of intravenous contrast. Lower chest: The visualized lung bases are clear. No intra-abdominal free air.  Trace free fluid within the pelvis. Hepatobiliary: Small cyst in the left lobe of the liver. The liver is otherwise unremarkable. The gallbladder is mildly distended. No calcified gallstone or pericholecystic fluid. There is mild dilatation of the intrahepatic biliary tree versus periportal edema. Pancreas: There is inflammatory changes and stranding surrounding the pancreas which may be related to portal venous thrombus or represent acute pancreatitis. Correlation with pancreatic enzymes recommended. No abscess. Spleen: Normal in size without focal abnormality. Adrenals/Urinary Tract: The adrenal glands unremarkable. The kidneys, visualized ureters, and urinary bladder appear unremarkable. Stomach/Bowel: Small hiatal hernia. There is inflammatory changes and fluid adjacent to the distal esophagus which may be related to inflammatory changes of the upper abdomen or represent esophagitis. Mild distal colonic diverticulosis without active inflammatory changes. There is no bowel obstruction. The appendix is normal. Vascular/Lymphatic: The abdominal aorta and IVC are grossly unremarkable on this noncontrast CT. There is mild dilatation of the SMV and main portal vein with high attenuating content consistent with thrombus. Evaluation is limited in the absence of intravenous contrast. Further evaluation with CT venogram or duplex ultrasound recommended. No portal venous gas. Mildly enlarged periportal lymph nodes, likely reactive. Reproductive: Hysterectomy.  No suspicious adnexal masses. Other: VP shunt with tip in the left lower quadrant. Musculoskeletal: Lower lumbar posterior fusion. No acute osseous pathology. IMPRESSION: 1. Thrombus within the SMV and main portal vein. Further evaluation with CT venogram or interrogation  of the portal vein with duplex ultrasound recommended. 2. Inflammatory changes adjacent to the pancreas may be related to portal venous thrombus or represent acute pancreatitis. Correlation with pancreatic enzymes recommended. 3. Small hiatal hernia. Inflammatory changes and fluid adjacent to the distal esophagus may be related to inflammatory changes of the upper abdomen or represent esophagitis. 4. Mild distal colonic diverticulosis. No bowel obstruction. Normal appendix. These results were called by telephone at the time of interpretation on 09/19/2023 at 9:08 pm to DR [facility], who verbally acknowledged these results. Electronically Signed  By: Vanetta Chou M.D.   On: 09/19/2023 21:12   DG UGI W SINGLE CM (SOL OR THIN BA) Result Date: 08/31/2023 CLINICAL DATA:  282018 S/P repair of paraesophageal hernia 282018 EXAM: WATER SOLUBLE UPPER GI SERIES TECHNIQUE: Single-column upper GI series was performed using water soluble contrast. Radiation Exposure Index (as provided by the fluoroscopic device): 727.4 mGy Kerma CONTRAST:  100 mL of Omnipaque  300. COMPARISON:  None Available. FLUOROSCOPY: Fluoroscopy Time:  1 minute 18 seconds. FINDINGS: Scout radiograph: Left mediastinal drain noted. No free air under the domes of diaphragm. Nonobstructive bowel gas pattern. No paraesophageal hernia noted. There is smooth passage of contrast from the esophagus into the stomach. No evidence of leak. IMPRESSION: *Status post paraesophageal hernia repair.  No leak. Electronically Signed   By: Ree Molt M.D.   On: 08/31/2023 09:57    Labs:  CBC: Recent Labs    08/16/23 1137 08/31/23 0336 09/19/23 1246 09/20/23 0536  WBC 4.9 10.8* 8.5 6.6  HGB 13.6 10.7* 11.6* 10.4*  HCT 41.6 32.6* 36.2 31.4*  PLT 314 222 339 261    COAGS: No results for input(s): INR, APTT in the last 8760 hours.  BMP: Recent Labs    08/30/23 1050 08/31/23 0336 09/19/23 1246 09/20/23 0536  NA 131* 133* 133* 137  K 3.1* 3.8  3.7 3.7  CL 102 102 98 103  CO2 23 20* 22 21*  GLUCOSE 98 155* 113* 70  BUN 16 14 15 9   CALCIUM  8.6* 8.7* 9.2 8.3*  CREATININE 0.99 0.98 2.07* 1.23*  GFRNONAA >60 >60 26* 49*    LIVER FUNCTION TESTS: Recent Labs    06/05/23 0030 08/16/23 1137 09/19/23 1246 09/20/23 0536  BILITOT 0.4 0.3 0.7 0.5  AST 22 24 55* 25  ALT 19 29 46* 30  ALKPHOS 49 62 133* 99  PROT 7.0 8.0 8.1 6.4*  ALBUMIN  3.7 4.1 3.3* 2.7*    TUMOR MARKERS: No results for input(s): AFPTM, CEA, CA199, CHROMGRNA in the last 8760 hours.  Assessment and Plan:  I will discuss findings with Dr Jennefer We will contact [facility] asap   Thank you for this interesting consult.  I greatly enjoyed meeting Sherry Anderson and look forward to participating in their care.  A copy of this report was sent to the requesting provider on this date.  Electronically Signed: Sharlet DELENA Candle, PA-C 09/20/2023, 1:16 PM   I spent a total of 20 Minutes    in face to face in clinical consultation, greater than 50% of which was counseling/coordinating care for mesenteric vein thromboses

## 2023-09-20 NOTE — Plan of Care (Signed)
 FMTS Brief Progress Note  S: Patient seen at bedside for night rounds with Dr. Elicia. Patient reports pain is much improved from yesterday and is practically gone. Has not eaten anything but is feeling very hungry.    O: BP (!) 111/58 (BP Location: Right Arm)   Pulse 60   Temp 98 F (36.7 C) (Oral)   Resp 18   Ht 5' 2 (1.575 m)   Wt 76.2 kg   LMP  (LMP Unknown) Comment: not sexually active  SpO2 97%   BMI 30.73 kg/m   General: A&O, NAD HEENT: No sign of trauma, EOM grossly intact Respiratory: normal WOB GI: soft, NTND  Extremities: no peripheral edema. Skin: no lesions/rashes visualized Psych: Appropriate mood and affect   A/P: 63 yo F admitted for abdominal pain likely in the setting of SMV thrombosis. Patient is feeling much better since admission. Will continue to treat with heparin . Consider adding diet in AM after IR sees patient. - continue plan per day team - Orders reviewed. Labs for AM ordered, which was adjusted as needed.  - If condition changes, plan includes reevaluation and adjustment to plan as needed  Kameron Blethen, MD 09/20/2023, 8:36 PM PGY-1, [facility] Night Resident  Please page [phone] with questions.

## 2023-09-21 ENCOUNTER — Inpatient Hospital Stay (HOSPITAL_COMMUNITY): Payer: Medicare HMO

## 2023-09-21 DIAGNOSIS — K55069 Acute infarction of intestine, part and extent unspecified: Secondary | ICD-10-CM | POA: Diagnosis not present

## 2023-09-21 DIAGNOSIS — I81 Portal vein thrombosis: Secondary | ICD-10-CM

## 2023-09-21 DIAGNOSIS — E872 Acidosis, unspecified: Secondary | ICD-10-CM | POA: Diagnosis not present

## 2023-09-21 LAB — COMPREHENSIVE METABOLIC PANEL
ALT: 23 U/L (ref 0–44)
AST: 21 U/L (ref 15–41)
Albumin: 2.5 g/dL — ABNORMAL LOW (ref 3.5–5.0)
Alkaline Phosphatase: 103 U/L (ref 38–126)
Anion gap: 13 (ref 5–15)
BUN: 7 mg/dL — ABNORMAL LOW (ref 8–23)
CO2: 18 mmol/L — ABNORMAL LOW (ref 22–32)
Calcium: 8.2 mg/dL — ABNORMAL LOW (ref 8.9–10.3)
Chloride: 106 mmol/L (ref 98–111)
Creatinine, Ser: 1.02 mg/dL — ABNORMAL HIGH (ref 0.44–1.00)
GFR, Estimated: >60 mL/min (ref >60)
Glucose, Bld: 53 mg/dL — ABNORMAL LOW (ref 70–99)
Potassium: 3.9 mmol/L (ref 3.5–5.1)
Sodium: 137 mmol/L (ref 135–145)
Total Bilirubin: 0.7 mg/dL (ref 0.0–1.2)
Total Protein: 6.3 g/dL — ABNORMAL LOW (ref 6.5–8.1)

## 2023-09-21 LAB — CBC
HCT: 32.9 % — ABNORMAL LOW (ref 36.0–46.0)
Hemoglobin: 10.7 g/dL — ABNORMAL LOW (ref 12.0–15.0)
MCH: 29.1 pg (ref 26.0–34.0)
MCHC: 32.5 g/dL (ref 30.0–36.0)
MCV: 89.4 fL (ref 80.0–100.0)
Platelets: 217 10*3/uL (ref 150–400)
RBC: 3.68 MIL/uL — ABNORMAL LOW (ref 3.87–5.11)
RDW: 14.6 % (ref 11.5–15.5)
WBC: 6.3 10*3/uL (ref 4.0–10.5)
nRBC: 0 % (ref 0.0–0.2)

## 2023-09-21 LAB — GLUCOSE, CAPILLARY
Glucose-Capillary: 51 mg/dL — ABNORMAL LOW (ref 70–99)
Glucose-Capillary: 69 mg/dL — ABNORMAL LOW (ref 70–99)
Glucose-Capillary: 62 mg/dL — ABNORMAL LOW (ref 70–99)
Glucose-Capillary: 101 mg/dL — ABNORMAL HIGH (ref 70–99)
Glucose-Capillary: 138 mg/dL — ABNORMAL HIGH (ref 70–99)

## 2023-09-21 LAB — MAGNESIUM: Magnesium: 1.8 mg/dL (ref 1.7–2.4)

## 2023-09-21 LAB — HEPARIN LEVEL (UNFRACTIONATED): Heparin Unfractionated: 0.56 [IU]/mL (ref 0.30–0.70)

## 2023-09-21 MED ORDER — APIXABAN 5 MG PO TABS: 5.0000 mg | ORAL_TABLET | Freq: Two times a day (BID) | ORAL | Status: DC

## 2023-09-21 MED ORDER — DEXTROSE 50 % IV SOLN
1.0000 | Freq: Once | INTRAVENOUS | Status: AC
  Administered 2023-09-21: 50 mL via INTRAVENOUS
  Filled 2023-09-21: qty 50

## 2023-09-21 MED ORDER — DEXTROSE 5 % IN LACTATED RINGERS IV BOLUS
1000.0000 mL | Freq: Once | INTRAVENOUS | Status: AC
  Administered 2023-09-21: 1000 mL via INTRAVENOUS

## 2023-09-21 MED ORDER — SODIUM CHLORIDE 0.9 % IV BOLUS
500.0000 mL | Freq: Once | INTRAVENOUS | Status: AC
  Administered 2023-09-21: 500 mL via INTRAVENOUS

## 2023-09-21 MED ORDER — IOHEXOL 350 MG/ML SOLN
80.0000 mL | Freq: Once | INTRAVENOUS | Status: AC | PRN
  Administered 2023-09-21: 80 mL via INTRAVENOUS

## 2023-09-21 MED ORDER — APIXABAN 5 MG PO TABS
10.0000 mg | ORAL_TABLET | Freq: Two times a day (BID) | ORAL | Status: DC
  Administered 2023-09-21 – 2023-09-22 (×3): 10 mg via ORAL
  Filled 2023-09-21 (×3): qty 2

## 2023-09-21 MED ORDER — DEXTROSE IN LACTATED RINGERS 5 % IV SOLN: INTRAVENOUS | Status: DC

## 2023-09-21 NOTE — Progress Notes (Addendum)
 ANTICOAGULATION CONSULT NOTE  Pharmacy Consult for Heparin  Indication:  thrombus in SMV and main portal vein  No Known Allergies  Patient Measurements: Height: 5' 2 (157.5 cm) Weight: 76.2 kg (167 lb 15.9 oz) IBW/kg (Calculated) : 50.1 Heparin  Dosing Weight: 66.7 kg  Vital Signs: Temp: 98 F (36.7 C) (02/05 0728) Temp Source: Oral (02/05 0605) BP: 104/47 (02/05 0728) Pulse Rate: 81 (02/05 0728)  Labs: Recent Labs    09/19/23 1246 09/19/23 1246 09/19/23 1447 09/20/23 0536 09/20/23 1152 09/20/23 1954 09/21/23 0509  HGB 11.6*  --   --  10.4*  --   --  10.7*  HCT 36.2  --   --  31.4*  --   --  32.9*  PLT 339  --   --  261  --   --  217  HEPARINUNFRC  --    < >  --  0.65 0.70 0.68 0.56  CREATININE 2.07*  --   --  1.23*  --   --  1.02*  TROPONINIHS 6  --  6  --   --   --   --    < > = values in this interval not displayed.    Estimated Creatinine Clearance: 53.9 mL/min (A) (by C-G formula based on SCr of 1.02 mg/dL (H)).   Assessment: 73 yof with a history of hiatal hernia repair 3 weeks ago presented with upper abdominal pain for the past 3 weeks. CT ab/pelvis w/ thrombus in SMV and main portal vein. Heparin  per pharmacy consult placed.  2/5 AM: Heparin  level 0.56 - therapeutic, with heparin  running at 1100 units/hour. HGB stable high 10s, PLT 200s. No signs of bleeding or issues with heparin  infusion noted.    Goal of Therapy:  Heparin  level 0.3-0.7 units/ml Monitor platelets by anticoagulation protocol: Yes   Plan:  Continue  heparin  infusion at 1100 units/hr F/U AM labs F/U transition to DOAC   Massie Fila, PharmD Clinical Pharmacist  09/21/2023 7:30 AM   ADDENDUM:   Received approval from MD to transition heparin  gtt to apixaban  for DVT treatment.   STOP heparin  infusion START apixaban  10 mg BID x 7 days, followed by 5 mg BID- administer first dose at the same time heparin  infusion is stopped  Plan communicated to primary RN   Massie Fila,  PharmD Clinical Pharmacist  09/21/2023 2:38 PM

## 2023-09-21 NOTE — Assessment & Plan Note (Addendum)
 Bicarb trended down to 18 this morning on AM CMP; iso her hypoalbuminemia AG corrects to elevated with delta ratio <1 indicating presence of concurrent HAGMA/NAGMA. HAGMA is most likely related to underlying NPO status and starvation ketosis. Etiology of NAGMA is overall unclear at this time, but favor masked hyperchloremia given continuous NS infusion and boluses. Clinically, she appears very well and is without Na/K disturbances on labs; however, given refractory hypotension and bradycardia despite fluid resuscitation, cannot exclude more insidious causes.  - Switched fluids, as above  - AM cortisol check - Monitor CMP as above

## 2023-09-21 NOTE — Assessment & Plan Note (Addendum)
 Given history of prior provoked thrombus and repeat provoked thrombus formation given hiatal hernia repair in mid-January, recommend lifelong anticoagulation and Hematology evaluation outpatient . Asymptomatic and abdominal exam this morning overall benign.  - IR following, appreciate recs.  - Repeat lactic yesterday normal; obtaining CTA AP today per IR BRTO protocol  - Continue anticoagulation with heparin  per pharmacy, plan to transition to Eliquis  today - NPO except sips with meds, will consider advancing diet after results of CT and discussing with IR - Switch mIVF to D5LR 100ml/hr given ongoing hypoglycemia while NPO - Pain regimen: Tylenol  650 q6h with oxycodone  5 mg q6h prn, currently well controlled  - Bolus 1L D5LR for continued hypotension

## 2023-09-21 NOTE — Plan of Care (Signed)
   Problem: Education: Goal: Knowledge of General Education information will improve Description Including pain rating scale, medication(s)/side effects and non-pharmacologic comfort measures Outcome: Progressing

## 2023-09-21 NOTE — Progress Notes (Signed)
 Dr. Jennefer reviewed the CTA abdomen/pelvis BRTO  protocol from this morning and the portal thrombus is stable. I met with the patient at the bedside and discussed conservative management versus intervention with thrombectomy. I reviewed the procedure details, risks and benefits. I also discussed what conservative management would entail.   The patient stated that she is feeling much better and has almost no abdominal pain. The patient and her brother (at the bedside) requested conservative management at this time. They understand that if her clinical picture changes then an intervention may be warranted. The patient and her brother verbalized understanding.   The patient will follow up with Dr. Jennefer in one month at the IR outpatient clinic. She will have a repeat CTA abdomen/pelvis BRTO protocol at that time. A scheduler from our clinic will call her to arrange the appointment. IR recommends life-long anticoagulation. These plans and recommendations were shared with the patient's primary team via Epic chat.   Please contact IR with any questions/concerns.   Roxane Puerto, AGACNP-BC 09/21/2023, 12:55 PM

## 2023-09-21 NOTE — Assessment & Plan Note (Addendum)
 Improved to baseline after fluid rehydration.  - Continue mIVF: D5LR @ 174ml/hr; plan to advance PO following results of CT - Monitor CMP, Mg

## 2023-09-21 NOTE — Assessment & Plan Note (Addendum)
 Likely 2/2 thrombus given findings on CT venogram. RUQ US  with calculi of the gallbladder neck but no other findings concerning of acute cholecystitis or choledocholithiasis that would indicate this as a cause of her transaminitis. Resolved on repeat labs following fluid resuscitation.  - Monitor CMP  - Low threshold to consult surgery should clinical status change

## 2023-09-21 NOTE — TOC CM/SW Note (Signed)
 Transition of Care [facility]) - Inpatient Brief Assessment   Patient Details  Name: ILHAM ROUGHTON MRN: [id number] Date of Birth: Jul 07, 1961  Transition of Care [facility]) CM/SW Contact:    Roxie KANDICE Stain, RN Phone Number: 09/21/2023, 10:22 AM   Clinical Narrative:  Patient admitted for DVT, pancreatitis and on heparin  drip. No TOC needs at this time. Transition of Care Asessment: Insurance and Status: Insurance coverage has been reviewed Patient has primary care physician: Yes Home environment has been reviewed: safe to discharge home when medically stable Prior level of function:: independent Prior/Current Home Services: No current home services Social Drivers of Health Review: SDOH reviewed no interventions necessary Readmission risk has been reviewed: Yes Transition of care needs: no transition of care needs at this time

## 2023-09-21 NOTE — Hospital Course (Addendum)
 Sherry Anderson is a 63 y.o.female with a history of prior provoked PE/DVT following VP shunt placement in 2021, breast cancer, cervical cancer, IDA, hypothyroidism, and HTN, who was admitted to the [facility] at [facility] for venous thromboembolism involving the SMV, hepatic portal branches, and splenic branches, as well as concurrent AKI. Her hospital course is detailed below:  Mesenteric Venous Thrombosis  Presented w/ epigastric abdominal pain in the setting of recent hiatal hernia repair in mid-January. Found on admission CTAP and venogram with evidence of diffuse thrombi involving the SMV as well as the hepatic and splenic branches. She was anticoagulated on heparin  throughout admission and subsequently transitioned to Eliquis . IR followed this admission and repeat CTA AP was performed which demonstrated occlusive portal vein thrombosis w/ early cavernous transformation, occlusive SMV thrombosis, and splenic vein thrombosis with partial occlusion. IR to follow outpatient. Reassuringly, her pain was well controlled with Oxycodone  5 mg PRN and Tylenol  scheduled throughout admission. Anticoagulated with Eliquis  10 mg for 7 days till 2/11 followed by 5 mg daily thereafter. She was able to tolerate a regular diet without any episodes of nausea or vomiting while being pain free at discharge.  Pre-renal AKI Patient initially struggled with liquid diet following recent surgery and noted decreased PO due to her pain prior to arrival. Admission Cr elevated to 2.07 which improved to 0.88 by the time of discharge following fluid resuscitation. Encouraged PO ad lib at home.  Transaminitis  Acute Pancreatitis  Admission labs with mild elevation in AST/ALT and alk phos which normalized with fluid resuscitation and anticoagulation. Reassuringly, her lipase was wnl; however, given nature of pain and CT findings of peripancreatic stranding she met [facility] criteria for acute pancreatitis. RUQ US   demonstrated thrombosed vein and central intrahepatic portal vein, impacted 2.7 cm calculus within the gallbladder neck, and no evidence of acute cholecystitis.  HAGMA/NAGMA Bicarb trended down to 18 iso her hypoalbuminemia AG corrects to elevated with delta ratio <1 indicating presence of concurrent HAGMA/NAGMA. HAGMA is most likely related to underlying NPO status and starvation ketosis. Etiology of NAGMA is overall unclear at this time, but favor masked hyperchloremia given continuous NS infusion and boluses. Clinically, she appears very well and her labs improved prior to discharge.  Other chronic conditions were medically managed with home medications and formulary alternatives as necessary: Hypothyroidism: Continue synthroid  75 mcg Anxiety/Depression: Continue effexor  75 mg and seroquel  50mg  at bedtime HTN: Holding lisinopril  and dyazide  at this time given softer BP IDA: Continue iron  supplementation   PCP Follow-up Recommendations: Consider repeat CMP to monitor electrolytes, liver function  Ensure she is able to tolerate PO. Consider hematology consult outpatient for hypercoagulation workup.

## 2023-09-21 NOTE — Plan of Care (Signed)
  Problem: Education: Goal: Knowledge of General Education information will improve Description: Including pain rating scale, medication(s)/side effects and non-pharmacologic comfort measures Outcome: Progressing   Problem: Health Behavior/Discharge Planning: Goal: Ability to manage health-related needs will improve Outcome: Progressing   Problem: Clinical Measurements: Goal: Ability to maintain clinical measurements within normal limits will improve Outcome: Progressing Goal: Will remain free from infection Outcome: Progressing Goal: Cardiovascular complication will be avoided Outcome: Progressing   Problem: Activity: Goal: Risk for activity intolerance will decrease Outcome: Progressing   Problem: Nutrition: Goal: Adequate nutrition will be maintained Outcome: Progressing   Problem: Pain Managment: Goal: General experience of comfort will improve and/or be controlled Outcome: Progressing

## 2023-09-21 NOTE — Progress Notes (Addendum)
 Daily Progress Note Intern Pager: [phone]  Patient name: Sherry Anderson Medical record number: [id number] Date of birth: April 21, 1961 Age: 63 y.o. Gender: female  Primary Care Provider: Donah Laymon PARAS, MD Consultants: Interventional Radiology  Code Status: Full   Pt Overview and Major Events to Date:  2/3: admitted, started heparin  anticoagulation, NPO + mIVF NS 2/4: IR consulted 2/5: Stable CTAP, IR signed off  Assessment and Plan:  Sherry Anderson is a 63 y.o. F who is presenting with several weeks of acute upper abdominal pain iso recent hiatal hernia repair and found to have venous thrombus involving the SMV with satellite involvement of the branches of the hepatic and splenic veins. Also found to have AKI. Pertinent PMH/PSH includes provoked PE and DVT following MVD, VP shunt placement in 2021, breast cancer, cervical cancer, IDA, hypothyroidism, and HTN. Assessment & Plan Mesenteric vein thrombosis (HCC) Given history of prior provoked thrombus and repeat provoked thrombus formation given hiatal hernia repair in mid-January, recommend lifelong anticoagulation and Hematology evaluation outpatient . Asymptomatic and abdominal exam this morning overall benign.  - IR following, appreciate recs.  - Repeat lactic yesterday normal; obtaining CTA AP today per IR BRTO protocol  - Continue anticoagulation with heparin  per pharmacy, plan to transition to Eliquis  today - NPO except sips with meds, will consider advancing diet after results of CT and discussing with IR - Switch mIVF to D5LR 100ml/hr given ongoing hypoglycemia while NPO - Pain regimen: Tylenol  650 q6h with oxycodone  5 mg q6h prn, currently well controlled  - Bolus 1L D5LR for continued hypotension  Transaminitis Likely 2/2 thrombus given findings on CT venogram. RUQ US  with calculi of the gallbladder neck but no other findings concerning of acute cholecystitis or choledocholithiasis that would indicate this  as a cause of her transaminitis. Resolved on repeat labs following fluid resuscitation.  - Monitor CMP  - Low threshold to consult surgery should clinical status change  Metabolic acidosis Bicarb trended down to 18 this morning on AM CMP; iso her hypoalbuminemia AG corrects to elevated with delta ratio <1 indicating presence of concurrent HAGMA/NAGMA. HAGMA is most likely related to underlying NPO status and starvation ketosis. Etiology of NAGMA is overall unclear at this time, but favor masked hyperchloremia given continuous NS infusion and boluses. Clinically, she appears very well and is without Na/K disturbances on labs; however, given refractory hypotension and bradycardia despite fluid resuscitation, cannot exclude more insidious causes.  - Switched fluids, as above  - AM cortisol check - Monitor CMP as above  AKI (acute kidney injury) (HCC) (Resolved: 09/21/2023) Improved to baseline after fluid rehydration.  - Continue mIVF: D5LR @ 163ml/hr; plan to advance PO following results of CT - Monitor CMP, Mg Acute pancreatitis (Resolved: 09/21/2023) Asymptomatic today, unlikely pancreatitis contributing to clinical presentation.   FEN/GI: mIVF 100ml/hr PPx: anticoagulated on heparin   Dispo: likely home  in the next 1-2 days. Barriers include pending CTA AP, IR recs, oral anticoagulation plan.   Subjective:  NAEON. CBG this AM to 51 and was given amp of D50. No evidence of confusion, headache, weakness. Otherwise continues to feel significantly improved since admission and is without abdominal pain at this time. Has had a bowel movement since yesterday which was soft, without diarrhea, melena, hematochezia. She has urinated several times without issue as well. No nausea, vomiting, other focal sources of bleeding, chest pain, shortness of breath, leg pain.   Objective: Temp:  [97.8 F (36.6 C)-98.5 F (36.9 C)] 98  F (36.7 C) (02/05 0728) Pulse Rate:  [58-81] 58 (02/05 1002) Resp:  [18] 18  (02/05 0728) BP: (88-111)/(46-65) 93/65 (02/05 1002) SpO2:  [95 %-100 %] 100 % (02/05 1002) Physical Exam: General: No acute distress. Resting comfortably in bed on exam.  HEENT: No scleral icterus visualized.  Cardiovascular: Normal rate and rhythm. Normal S1/S2. Systolic ejection murmur present. No rubs, or gallops.  Respiratory: Breathing comfortably on room air. Good air movement throughout without wheezes, rales, or rhonchi.  Abdomen: Soft, no distension. Good BS throughout. Mildly tender to palpation in the epigastrium. No rebound tenderness, guarding. Negative Murphy's. There are several well-healing scars consistent with prior port sites from recent surgery.  Extremities: Warm and well perfused. No lower extremity swelling. Negative Homans bilaterally.  Neuro: Moving all extremities in bed appropriately and without apparent deficit. A&Ox3.   Laboratory: Most recent CBC Lab Results  Component Value Date   WBC 6.3 09/21/2023   HGB 10.7 (L) 09/21/2023   HCT 32.9 (L) 09/21/2023   MCV 89.4 09/21/2023   PLT 217 09/21/2023   Most recent BMP    Latest Ref Rng & Units 09/21/2023    5:09 AM  BMP  Glucose 70 - 99 mg/dL 53   BUN 8 - 23 mg/dL 7   Creatinine 9.55 - 8.99 mg/dL 8.97   Sodium 864 - 854 mmol/L 137   Potassium 3.5 - 5.1 mmol/L 3.9   Chloride 98 - 111 mmol/L 106   CO2 22 - 32 mmol/L 18   Calcium  8.9 - 10.3 mg/dL 8.2     Other pertinent labs: Repeat lactic wnl, Mg wnl, CBG this AM 51   Imaging/Diagnostic Tests: No new imaging/diagnostic tests since last progress note. Pending CTA AP this afternoon.   Drena Fallow, Medical Student 09/21/2023, 12:03 PM [facility] FPTS Intern pager: [phone], text pages welcome Secure chat group [facility] Teaching Service   Upper Level Addendum:   I have seen and evaluated this patient along with Student Doctor Drena and reviewed the above note, making necessary revisions as appropriate.  I agree  with the medical decision making and physical exam as noted above.   Izetta Nap, DO PGY-2, [facility] Residency

## 2023-09-21 NOTE — Progress Notes (Signed)
 While reviewing AM labs, BG was 53. Rechecked accucheck, BG 51. Patient alert and oriented, notified dr Jari Merles- see orders.

## 2023-09-21 NOTE — Assessment & Plan Note (Addendum)
 Asymptomatic today, unlikely pancreatitis contributing to clinical presentation.

## 2023-09-21 NOTE — Assessment & Plan Note (Deleted)
-  As above.

## 2023-09-22 ENCOUNTER — Other Ambulatory Visit (HOSPITAL_COMMUNITY): Payer: Self-pay

## 2023-09-22 DIAGNOSIS — I81 Portal vein thrombosis: Secondary | ICD-10-CM | POA: Diagnosis not present

## 2023-09-22 DIAGNOSIS — N179 Acute kidney failure, unspecified: Secondary | ICD-10-CM | POA: Diagnosis not present

## 2023-09-22 LAB — CBC
HCT: 25 % — ABNORMAL LOW (ref 36.0–46.0)
HCT: 27.1 % — ABNORMAL LOW (ref 36.0–46.0)
Hemoglobin: 8.5 g/dL — ABNORMAL LOW (ref 12.0–15.0)
Hemoglobin: 9.2 g/dL — ABNORMAL LOW (ref 12.0–15.0)
MCH: 29.2 pg (ref 26.0–34.0)
MCH: 29.1 pg (ref 26.0–34.0)
MCHC: 34 g/dL (ref 30.0–36.0)
MCHC: 33.9 g/dL (ref 30.0–36.0)
MCV: 85.9 fL (ref 80.0–100.0)
MCV: 85.8 fL (ref 80.0–100.0)
Platelets: 245 10*3/uL (ref 150–400)
Platelets: 272 10*3/uL (ref 150–400)
RBC: 2.91 MIL/uL — ABNORMAL LOW (ref 3.87–5.11)
RBC: 3.16 MIL/uL — ABNORMAL LOW (ref 3.87–5.11)
RDW: 14.5 % (ref 11.5–15.5)
RDW: 14.6 % (ref 11.5–15.5)
WBC: 4.1 10*3/uL (ref 4.0–10.5)
WBC: 4.2 10*3/uL (ref 4.0–10.5)
nRBC: 0 % (ref 0.0–0.2)
nRBC: 0 % (ref 0.0–0.2)

## 2023-09-22 LAB — BASIC METABOLIC PANEL
Anion gap: 9 (ref 5–15)
BUN: <5 mg/dL — ABNORMAL LOW (ref 8–23)
CO2: 24 mmol/L (ref 22–32)
Calcium: 8.4 mg/dL — ABNORMAL LOW (ref 8.9–10.3)
Chloride: 106 mmol/L (ref 98–111)
Creatinine, Ser: 0.88 mg/dL (ref 0.44–1.00)
GFR, Estimated: >60 mL/min (ref >60)
Glucose, Bld: 156 mg/dL — ABNORMAL HIGH (ref 70–99)
Potassium: 3.1 mmol/L — ABNORMAL LOW (ref 3.5–5.1)
Sodium: 139 mmol/L (ref 135–145)

## 2023-09-22 LAB — COMPREHENSIVE METABOLIC PANEL
ALT: 19 U/L (ref 0–44)
AST: 18 U/L (ref 15–41)
Albumin: 2.1 g/dL — ABNORMAL LOW (ref 3.5–5.0)
Alkaline Phosphatase: 87 U/L (ref 38–126)
Anion gap: 8 (ref 5–15)
BUN: <5 mg/dL — ABNORMAL LOW (ref 8–23)
CO2: 23 mmol/L (ref 22–32)
Calcium: 7.9 mg/dL — ABNORMAL LOW (ref 8.9–10.3)
Chloride: 109 mmol/L (ref 98–111)
Creatinine, Ser: 0.79 mg/dL (ref 0.44–1.00)
GFR, Estimated: >60 mL/min (ref >60)
Glucose, Bld: 117 mg/dL — ABNORMAL HIGH (ref 70–99)
Potassium: 2.9 mmol/L — ABNORMAL LOW (ref 3.5–5.1)
Sodium: 140 mmol/L (ref 135–145)
Total Bilirubin: 0.4 mg/dL (ref 0.0–1.2)
Total Protein: 5.5 g/dL — ABNORMAL LOW (ref 6.5–8.1)

## 2023-09-22 LAB — GLUCOSE, CAPILLARY
Glucose-Capillary: 117 mg/dL — ABNORMAL HIGH (ref 70–99)
Glucose-Capillary: 119 mg/dL — ABNORMAL HIGH (ref 70–99)
Glucose-Capillary: 157 mg/dL — ABNORMAL HIGH (ref 70–99)
Glucose-Capillary: 99 mg/dL (ref 70–99)

## 2023-09-22 LAB — MAGNESIUM: Magnesium: 1.6 mg/dL — ABNORMAL LOW (ref 1.7–2.4)

## 2023-09-22 LAB — CORTISOL-AM, BLOOD: Cortisol - AM: 9.8 ug/dL (ref 6.7–22.6)

## 2023-09-22 MED ORDER — APIXABAN 5 MG PO TABS
ORAL_TABLET | ORAL | 0 refills | Status: DC
  Filled 2023-09-22: qty 84, 36d supply, fill #0

## 2023-09-22 MED ORDER — POTASSIUM CHLORIDE CRYS ER 20 MEQ PO TBCR: 60.0000 meq | EXTENDED_RELEASE_TABLET | Freq: Once | ORAL | Status: DC

## 2023-09-22 MED ORDER — POTASSIUM CHLORIDE CRYS ER 20 MEQ PO TBCR
40.0000 meq | EXTENDED_RELEASE_TABLET | Freq: Two times a day (BID) | ORAL | Status: DC
  Administered 2023-09-22: 40 meq via ORAL
  Filled 2023-09-22: qty 2

## 2023-09-22 MED ORDER — MAGNESIUM SULFATE IN D5W 1-5 GM/100ML-% IV SOLN
1.0000 g | Freq: Once | INTRAVENOUS | Status: AC
  Administered 2023-09-22: 1 g via INTRAVENOUS
  Filled 2023-09-22: qty 100

## 2023-09-22 MED ORDER — MAGNESIUM SULFATE 2 GM/50ML IV SOLN
2.0000 g | Freq: Once | INTRAVENOUS | Status: AC
  Administered 2023-09-22: 2 g via INTRAVENOUS
  Filled 2023-09-22: qty 50

## 2023-09-22 MED ORDER — POTASSIUM CHLORIDE CRYS ER 20 MEQ PO TBCR: 60.0000 meq | EXTENDED_RELEASE_TABLET | Freq: Two times a day (BID) | ORAL | Status: DC

## 2023-09-22 MED ORDER — POTASSIUM CHLORIDE CRYS ER 20 MEQ PO TBCR
60.0000 meq | EXTENDED_RELEASE_TABLET | Freq: Once | ORAL | Status: AC
  Administered 2023-09-22: 60 meq via ORAL
  Filled 2023-09-22: qty 3

## 2023-09-22 NOTE — Assessment & Plan Note (Deleted)
 Given history of prior provoked thrombus and repeat provoked thrombus formation given hiatal hernia repair in mid-January, recommend lifelong anticoagulation and hematology evaluation outpatient. Asymptomatic and abdominal exam this morning overall benign.  - IR following, appreciate recs - Repeat lactic yesterday normal; obtaining CTA AP today per IR BRTO protocol  - Continue Eliquis  - Pain regimen: Tylenol  650 q6h with oxycodone  5 mg q6h prn, currently well controlled - Hgb 10.7>8.5 likely dilution s/p LR bolus and mIVF 100 mL/hr

## 2023-09-22 NOTE — Progress Notes (Signed)
 DISCHARGE NOTE HOME Sherry Anderson to be discharged Home per MD order. Discussed prescriptions and follow up appointments with the patient. Prescriptions given to patient; medication list explained in detail. Patient verbalized understanding.  Skin clean, dry and intact without evidence of skin break down, no evidence of skin tears noted. IV catheter discontinued intact. Site without signs and symptoms of complications. Dressing and pressure applied. Pt denies pain at the site currently. No complaints noted.  Patient free of lines, drains, and wounds other than noted on LDA  An After Visit Summary (AVS) was printed and given to the patient. Patient escorted via wheelchair, and discharged home via private auto.  [name], RN

## 2023-09-22 NOTE — Assessment & Plan Note (Deleted)
 Bicarb improved to 23 this AM.  trended down to 18 this morning on AM CMP; iso her hypoalbuminemia AG corrects to 8 > NAGMA. Previously had HAGMA/NAGMA likely related to underlying NPO status and starvation ketosis. Etiology of NAGMA is overall unclear at this time, but favor masked hyperchloremia given continuous NS infusion and boluses. Clinically, she appears very well and is without Na/K disturbances on labs; however, given refractory hypotension and bradycardia despite fluid resuscitation, cannot exclude more insidious causes.  AM Cortisol 9.8.  - Discontinued fluids - Monitor CMP as above

## 2023-09-22 NOTE — Discharge Summary (Signed)
 Family Medicine Teaching [facility] Discharge Summary  Patient name: Sherry Anderson Medical record number: [id number] Date of birth: April 18, 1961 Age: 63 y.o. Gender: female Date of Admission: 09/19/2023  Date of Discharge: 09/22/23  Admitting Physician: Winn Ogren, MD  Primary Care Provider: Donah Laymon PARAS, MD Consultants: IR  Indication for Hospitalization: abdominal pain 2/2 thombosis  Discharge Diagnoses/Problem List:  Principal Problem for Admission: Mesenteric Vein Thrombosis Other Problems addressed during stay:  Principal Problem:   Mesenteric vein thrombosis (HCC) Active Problems:   Transaminitis   Portal vein thrombosis   Metabolic acidosis  Brief Hospital Course:  Sherry Anderson is a 63 y.o.female with a history of prior provoked PE/DVT following VP shunt placement in 2021, breast cancer, cervical cancer, IDA, hypothyroidism, and HTN, who was admitted to the [facility] at [facility] for venous thromboembolism involving the SMV, hepatic portal branches, and splenic branches, as well as concurrent AKI. Her hospital course is detailed below:  Mesenteric Venous Thrombosis  Presented w/ epigastric abdominal pain in the setting of recent hiatal hernia repair in mid-January. Found on admission CTAP and venogram with evidence of diffuse thrombi involving the SMV as well as the hepatic and splenic branches. She was anticoagulated on heparin  throughout admission and subsequently transitioned to Eliquis . IR followed this admission and repeat CTA AP was performed which demonstrated occlusive portal vein thrombosis w/ early cavernous transformation, occlusive SMV thrombosis extending into several L-sided mesenteric branches, and splenic vein thrombosis with partially occlusive thrombus at hilum and occlusive thrombus centrally at portal splenic confluence.  Reassuringly, her pain was well controlled with Oxycodone  5 mg PRN and Tylenol  scheduled  throughout admission. Anticoagulated with Eliquis  10 mg for 7 days till 2/11 followed by 5 mg daily thereafter. She was able to tolerate a regular diet without any episodes of nausea or vomiting while being pain free at discharge.  Pre-renal AKI Patient initially struggled with liquid diet following recent surgery and noted decreased PO due to her pain prior to arrival. Admission Cr elevated to 2.07 which improved to 0.88 by the time of discharge following fluid resuscitation. Encouraged PO ad lib at home.  Transaminitis  Acute Pancreatitis  Admission labs with mild elevation in AST/ALT and alk phos which normalized with fluid resuscitation and anticoagulation. Reassuringly, her lipase was wnl; however, given nature of pain and CT findings of peripancreatic stranding she met [facility] criteria for acute pancreatitis. RUQ US  demonstrated thrombosed vein and central intrahepatic portal vein, impacted 2.7 cm calculus within the gallbladder neck, and no evidence of acute cholecystitis.  HAGMA/NAGMA Bicarb trended down to 18 iso her hypoalbuminemia AG corrects to elevated with delta ratio <1 indicating presence of concurrent HAGMA/NAGMA. HAGMA is most likely related to underlying NPO status and starvation ketosis. Etiology of NAGMA is overall unclear at this time, but favor masked hyperchloremia given continuous NS infusion and boluses. Clinically, she appears very well and her labs improved prior to discharge.  Other chronic conditions were medically managed with home medications and formulary alternatives as necessary: Hypothyroidism: Continue synthroid  75 mcg Anxiety/Depression: Continue effexor  75 mg and seroquel  50mg  at bedtime HTN: Holding lisinopril  and dyazide  at this time given softer BP IDA: Continue iron  supplementation   PCP Follow-up Recommendations: Consider repeat CMP to monitor electrolytes, liver function  Ensure she is able to tolerate PO. Consider hematology consult outpatient for  hypercoagulation workup.   Disposition: Home  Discharge Condition: Stable  Discharge Exam:  Vitals:   09/22/23 0436 09/22/23 0700  BP:  121/61 (!) 110/59  Pulse: 77 63  Resp: 18 17  Temp: 98.3 F (36.8 C) 98.3 F (36.8 C)  SpO2: 99% 96%   General: Awake and Alert in NAD HEENT: NCAT. Sclera anicteric. Cardiovascular: RRR. No M/R/G. Respiratory: CTAB, normal WOB on RA. No wheezing, crackles, rhonchi, or diminished breath sounds. Abdomen: Soft, non-tender, non-distended. Bowel sounds normoactive Extremities: Able to move all extremities equally. No BLE edema, no deformities or significant joint findings. Skin: Warm and dry. Neuro:No focal neurological deficits.  Significant Procedures: none  Significant Labs and Imaging:  Recent Labs  Lab 09/21/23 0509 09/22/23 0503 09/22/23 1159  WBC 6.3 4.1 4.2  HGB 10.7* 8.5* 9.2*  HCT 32.9* 25.0* 27.1*  PLT 217 245 272   Recent Labs  Lab 09/21/23 0509 09/22/23 0503 09/22/23 1159  NA 137 140 139  K 3.9 2.9* 3.1*  CL 106 109 106  CO2 18* 23 24  GLUCOSE 53* 117* 156*  BUN 7* <5* <5*  CREATININE 1.02* 0.79 0.88  CALCIUM  8.2* 7.9* 8.4*  MG 1.8 1.6*  --   ALKPHOS 103 87  --   AST 21 18  --   ALT 23 19  --   ALBUMIN  2.5* 2.1*  --     CT AP w/o contrast 1. Thrombus within the SMV and main portal vein. Further evaluation with CT venogram or interrogation of the portal vein with duplex ultrasound recommended. 2. Inflammatory changes adjacent to the pancreas may be related to portal venous thrombus or represent acute pancreatitis. Correlation with pancreatic enzymes recommended. 3. Small hiatal hernia. Inflammatory changes and fluid adjacent to the distal esophagus may be related to inflammatory changes of the upper abdomen or represent esophagitis. 4. Mild distal colonic diverticulosis. No bowel obstruction. Normal appendix.  CT Venogram Limited RUQ 1. Diffuse thrombosis demonstrated in the portal veins, superior mesenteric  vein, mesenteric vein branches, and in the splenic vein. There is associated edema/stranding. 2. Peripancreatic stranding likely arises from the venous thrombosis but could indicate early acute pancreatitis. No abscess. 3. Esophageal hiatal hernia with paraesophageal fluid, possibly inflammatory reaction or esophagitis. 4. Decompression of small bowel limits evaluation but there is no evidence of bowel wall thickening or ischemia.  RUQ US  1. Thrombosed main and central intrahepatic portal vein. 2. Impacted 2.7 cm calculus within the gallbladder neck. No sonographic evidence of acute cholecystitis.  CT Angio Abd/Pelvis BRTO 1. Occlusive portal vein thrombosis with early cavernous transformation. 2. Occlusive SMV thrombosis extending into several left-sided mesenteric branches. 3. Splenic vein thrombosis, with partially occlusive thrombus at the hilum, and occlusive thrombus centrally at the portal splenic confluence. 4. No significant splenorenal shunt nor gastric varices. 5. Small bilateral pleural effusions. 6. Circumferential wall thickening in the distal esophagus with mild adjacent inflammatory changes. 7. Trace pelvic and perihepatic ascites.  Results/Tests Pending at Time of Discharge: none  Discharge Medications:  Allergies as of 09/22/2023   No Known Allergies      Medication List     PAUSE taking these medications    lisinopril  20 MG tablet Wait to take this until your doctor or other care provider tells you to start again. Commonly known as: ZESTRIL  Take 1 tablet (20 mg total) by mouth at bedtime.   triamterene -hydrochlorothiazide  37.5-25 MG capsule Wait to take this until your doctor or other care provider tells you to start again. Commonly known as: DYAZIDE  TAKE 1 CAPSULE BY MOUTH EVERY DAY       TAKE these medications    B-12  PO Take 1,000 mcg by mouth daily.   CALCIUM  + D PO Take 1 tablet by mouth daily.   Eliquis  5 MG Tabs tablet Generic  drug: apixaban  Take 2 tablets (10 mg total) by mouth 2 (two) times daily for 6 days, THEN 1 tablet (5 mg total) 2 (two) times daily. Start taking on: September 22, 2023   ferrous sulfate  325 (65 FE) MG tablet Take 1 tablet (325 mg total) by mouth every other day.   levothyroxine  75 MCG tablet Commonly known as: SYNTHROID  Take 1 tablet (75 mcg total) by mouth every morning. 30 minutes before food   ondansetron  4 MG tablet Commonly known as: Zofran  Take 1 tablet (4 mg total) by mouth daily as needed for nausea or vomiting.   PROBIOTIC PO Take 1 tablet by mouth daily.   QUEtiapine  50 MG tablet Commonly known as: SEROQUEL  Take 1 tablet (50 mg total) by mouth at bedtime.   Theratears 0.25 % Soln Generic drug: Carboxymethylcellulose Sodium Place 1 drop into both eyes daily as needed (Dry eyes).   venlafaxine  XR 75 MG 24 hr capsule Commonly known as: EFFEXOR -XR TAKE THREE CAPSULES BY MOUTH ONCE DAILY        Discharge Instructions: Please refer to Patient Instructions section of EMR for full details.  Patient was counseled important signs and symptoms that should prompt return to medical care, changes in medications, dietary instructions, activity restrictions, and follow up appointments.   Follow-Up Appointments:  Follow-up Information     [facility] Follow up.   Why: Please follow up with Dr. Jennefer with Interventional Radiology in one month to assess portal vein thrombus. A scheduler from our clinic will call you with a date/time of your appointment. Contact information: [address] [place] [address] [phone]                Janna Ferrier, DO 09/22/2023, 4:55 PM PGY-1, [facility]

## 2023-09-22 NOTE — Plan of Care (Signed)

## 2023-09-22 NOTE — Plan of Care (Signed)
 Patient alert/oriented X4. Patient compliant with medication administration and tolerated IV magnesium . Patient AVS discharge instructions explained to patient, PIV removed, pending medication notification from [facility] pharmacy to pickup. VSS, no complaints at this time.   Problem: Education: Goal: Knowledge of General Education information will improve Description: Including pain rating scale, medication(s)/side effects and non-pharmacologic comfort measures Outcome: Adequate for Discharge   Problem: Health Behavior/Discharge Planning: Goal: Ability to manage health-related needs will improve Outcome: Adequate for Discharge   Problem: Clinical Measurements: Goal: Ability to maintain clinical measurements within normal limits will improve Outcome: Adequate for Discharge   Problem: Clinical Measurements: Goal: Will remain free from infection Outcome: Adequate for Discharge   Problem: Clinical Measurements: Goal: Diagnostic test results will improve Outcome: Adequate for Discharge   Problem: Clinical Measurements: Goal: Respiratory complications will improve Outcome: Adequate for Discharge   Problem: Clinical Measurements: Goal: Cardiovascular complication will be avoided Outcome: Adequate for Discharge   Problem: Activity: Goal: Risk for activity intolerance will decrease Outcome: Adequate for Discharge   Problem: Nutrition: Goal: Adequate nutrition will be maintained Outcome: Adequate for Discharge   Problem: Coping: Goal: Level of anxiety will decrease Outcome: Adequate for Discharge   Problem: Elimination: Goal: Will not experience complications related to bowel motility Outcome: Adequate for Discharge   Problem: Elimination: Goal: Will not experience complications related to urinary retention Outcome: Adequate for Discharge   Problem: Pain Managment: Goal: General experience of comfort will improve and/or be controlled Outcome: Adequate for Discharge   Problem:  Safety: Goal: Ability to remain free from injury will improve Outcome: Adequate for Discharge   Problem: Skin Integrity: Goal: Risk for impaired skin integrity will decrease Outcome: Adequate for Discharge

## 2023-09-22 NOTE — Assessment & Plan Note (Deleted)
 Likely 2/2 thrombus given findings on CT venogram. RUQ US  with calculi of the gallbladder neck but no other findings concerning of acute cholecystitis or choledocholithiasis that would indicate this as a cause of her transaminitis. Resolved on repeat labs following fluid resuscitation.  - Monitor CMP  - Low threshold to consult surgery should clinical status change

## 2023-09-23 ENCOUNTER — Telehealth: Payer: Self-pay

## 2023-09-23 NOTE — Transitions of Care (Post Inpatient/ED Visit) (Signed)
 09/23/2023  Name: Sherry Anderson MRN: [id number] DOB: 08-21-1960  Today's TOC FU Call Status: Today's TOC FU Call Status:: Successful TOC FU Call Completed TOC FU Call Complete Date: 09/23/23 Patient's Name and Date of Birth confirmed.  Transition Care Management Follow-up Telephone Call Date of Discharge: 09/22/23 Discharge Facility: [facility]) Type of Discharge: Inpatient Admission Primary Inpatient Discharge Diagnosis:: Mesenteric Vein Throombosis How have you been since you were released from the hospital?: Better Any questions or concerns?: Yes Patient Questions/Concerns:: Patient is concerned if she lays down too much the blood clot will get worse.  She is very tired because of this issue and her fear of resting Patient Questions/Concerns Addressed: Notified Provider of Patient Questions/Concerns  Items Reviewed: Did you receive and understand the discharge instructions provided?: Yes Medications obtained,verified, and reconciled?: Yes (Medications Reviewed) Any new allergies since your discharge?: No Dietary orders reviewed?: No Do you have support at home?: Yes People in Home: sibling(s) Name of Support/Comfort Primary Source: Sister  Medications Reviewed Today: Medications Reviewed Today     Reviewed by Fabian Sober, RN (Case Manager) on 09/23/23 at 1340  Med List Status: <None>   Medication Order Taking? Sig Documenting Provider Last Dose Status Informant  apixaban  (ELIQUIS ) 5 MG TABS tablet [id number] Yes Take 2 tablets (10 mg total) by mouth 2 (two) times daily for 6 days, THEN 1 tablet (5 mg total) 2 (two) times daily. Romelle Booty, MD Taking Active   Calcium  Citrate-Vitamin D  (CALCIUM  + D PO) [id number] Yes Take 1 tablet by mouth daily. [provider] Taking Active Self           Med Note LINDON, [facility]   Fri Apr 15, 2023 10:22 AM)    Carboxymethylcellulose Sodium (THERATEARS) 0.25 % SOLN [id number] Yes Place 1 drop into both eyes daily  as needed (Dry eyes). [provider] Taking Active Self  Cyanocobalamin  (B-12 PO) [id number] Yes Take 1,000 mcg by mouth daily. [provider] Taking Active Self           Med Note (ROBB, MELANIE A   Tue Sep 16, 2020  1:14 PM)    ferrous sulfate  325 (65 FE) MG tablet [id number] Yes Take 1 tablet (325 mg total) by mouth every other day. Donah Laymon PARAS, MD Taking Active Self  levothyroxine  (SYNTHROID ) 75 MCG tablet [id number] Yes Take 1 tablet (75 mcg total) by mouth every morning. 30 minutes before food Donah Laymon PARAS, MD Taking Active Self  lisinopril  (ZESTRIL ) 20 MG tablet [id number] No Take 1 tablet (20 mg total) by mouth at bedtime.  Patient not taking: Reported on 09/23/2023   Donah Laymon PARAS, MD Not Taking Active Self  ondansetron  (ZOFRAN ) 4 MG tablet [id number] Yes Take 1 tablet (4 mg total) by mouth daily as needed for nausea or vomiting. Tanda Locus, MD Taking Active Self  Probiotic Product (PROBIOTIC PO) [id number] Yes Take 1 tablet by mouth daily. [provider] Taking Active Self  QUEtiapine  (SEROQUEL ) 50 MG tablet [id number] Yes Take 1 tablet (50 mg total) by mouth at bedtime. Donah Laymon PARAS, MD Taking Active Self  triamterene -hydrochlorothiazide  (DYAZIDE ) 37.5-25 MG capsule [id number] No TAKE 1 CAPSULE BY MOUTH EVERY DAY  Patient not taking: Reported on 09/23/2023   Donah Laymon PARAS, MD Not Taking Active Self  venlafaxine  XR (EFFEXOR -XR) 75 MG 24 hr capsule [id number] Yes TAKE THREE CAPSULES BY MOUTH ONCE DAILY Donah Laymon PARAS, MD Taking Active Self  Home Care and Equipment/Supplies: Were Home Health Services Ordered?: No Any new equipment or medical supplies ordered?: No  Functional Questionnaire: Do you need assistance with bathing/showering or dressing?: No Do you need assistance with meal preparation?: No Do you need assistance with eating?: No Do you have difficulty maintaining continence: No Do you need  assistance with getting out of bed/getting out of a chair/moving?: No Do you have difficulty managing or taking your medications?: No  Follow up appointments reviewed: PCP Follow-up appointment confirmed?: Yes Date of PCP follow-up appointment?: 09/26/23 Follow-up Provider: Dr. Donah Specialist [facility] Follow-up appointment confirmed?: NA  SDOH Interventions Today    Flowsheet Row Most Recent Value  SDOH Interventions   Food Insecurity Interventions Intervention Not Indicated  Housing Interventions Intervention Not Indicated  Transportation Interventions Intervention Not Indicated  Utilities Interventions Intervention Not Indicated      TOC Interventions Today    Flowsheet Row Most Recent Value  TOC Interventions   TOC Interventions Discussed/Reviewed TOC Interventions Discussed, TOC Interventions Reviewed, Contacted provider for patient needs  [facility] sent to [facility] RN pool to let Dr. Donah know the patient is afraid to rest because of her blood clot,  She doesn't want to lay down.]        Goals Addressed             This Visit's Progress    30 day TOC Program       Current Barriers:  Medication management Changes with blood pressure medications  RNCM Clinical Goal(s):  Patient will work with the Care Management team over the next 30 days to address Transition of Care Barriers: Medication Management Provider appointments through collaboration with RN Care manager, provider, and care team.   Interventions: Evaluation of current treatment plan related to  self management and patient's adherence to plan as established by provider  Transitions of Care:  New goal. Doctor Visits  - discussed the importance of doctor visits  Patient Goals/Self-Care Activities: Participate in Transition of Care Program/Attend [facility] scheduled calls Notify RN Care Manager of [facility] call rescheduling needs Take all medications as prescribed Attend all scheduled provider appointments Call  pharmacy for medication refills 3-7 days in advance of running out of medications  Follow Up Plan:  Telephone follow up appointment with care management team member scheduled for:  09/30/23 at 11:00          Barnie Gowda RN, BSN, CCM Branch  Value Based Care Institute Manager Population Health Direct Dial: [phone]  Fax: [phone]

## 2023-09-23 NOTE — Telephone Encounter (Signed)
 Patient working with Irineo Manns follow up scheduled 2/14.   Barnie Bora  [facility]  Value-Based Care Institute, [facility] Guide  Direct Dial: [phone]  Fax [phone]

## 2023-09-26 ENCOUNTER — Ambulatory Visit (INDEPENDENT_AMBULATORY_CARE_PROVIDER_SITE_OTHER): Payer: Medicare HMO | Admitting: Family Medicine

## 2023-09-26 ENCOUNTER — Encounter: Payer: Self-pay | Admitting: Family Medicine

## 2023-09-26 VITALS — BP 131/81 | HR 81 | Ht 62.0 in | Wt 169.8 lb

## 2023-09-26 DIAGNOSIS — K449 Diaphragmatic hernia without obstruction or gangrene: Secondary | ICD-10-CM

## 2023-09-26 DIAGNOSIS — K55069 Acute infarction of intestine, part and extent unspecified: Secondary | ICD-10-CM

## 2023-09-26 DIAGNOSIS — I10 Essential (primary) hypertension: Secondary | ICD-10-CM | POA: Diagnosis not present

## 2023-09-26 NOTE — Progress Notes (Signed)
  Date of Visit: 09/26/2023   SUBJECTIVE:   HPI:  Sherry Anderson presents today for hospital follow up. Was hospitalized from 2/3 to 09/22/23 with abdominal pain, found to have mesenteric vein thrombosis after recent hiatal hernia repair.  Hypertension - was told to stop lisinopril and triamterene-hydrochlorothiazide on discharge from hospital. Has checked blood pressures at home and gotten some #s in 140s systolic.   Postop - Not able to eat a lot lately, has been on clear liquid diet x3 weeks. Able to keep liquids down. Feels hungry and tries to eat but gets full quickly (was told that is to be expected after surgery).   Mesenteric vein thrombosis - was told she would need to be on anticoagulation lifelong. Tolerating eliquis, no bleeding.   PCP Follow-up Recommendations from Discharge Summary: Consider repeat CMP to monitor electrolytes, liver function  Ensure she is able to tolerate PO. Consider hematology consult outpatient for hypercoagulation workup.  OBJECTIVE:   BP 131/81   Pulse 81   Ht 5\' 2"  (1.575 m)   Wt 169 lb 12.8 oz (77 kg)   LMP  (LMP Unknown) Comment: not sexually active  SpO2 99%   BMI 31.06 kg/m  Gen: no acute distress, pleasant cooperative HEENT: normocephalic, atraumatic  Heart: regular rate and rhythm, no murmur Lungs: clear to auscultation bilaterally, normal work of breathing  Abdomen: soft, nontender to palpation  Neuro: alert grossly nofocal speech normal  ASSESSMENT/PLAN:   Assessment & Plan Essential hypertension, benign Blood pressure at goal here today Continue holding lisinopril & triamterene-hydrochlorothiazide She will continue to check at home Follow up in 1 month to recheck Large hiatal hernia Tolerating PO intake fine, appears well hydrated today Mesenteric vein thrombosis (HCC) Update CBC and CMP today Plan indefinite anticoagulation as this is her second clotting event    FOLLOW UP: Follow up in 1 mo for above issues  [place] J.  [name], MD [facility]

## 2023-09-26 NOTE — Patient Instructions (Signed)
 It was great to see you again today.  Checking bloodwork today Stay off the blood pressure medications for now Follow up with me in 1 month, sooner if needed  Be well, Dr. Dawn Eth

## 2023-09-27 LAB — CBC WITH DIFFERENTIAL/PLATELET
Basophils Absolute: 0 x10E3/uL (ref 0.0–0.2)
Basos: 1 %
EOS (ABSOLUTE): 0.3 x10E3/uL (ref 0.0–0.4)
Eos: 7 %
Hematocrit: 31.6 % — ABNORMAL LOW (ref 34.0–46.6)
Hemoglobin: 10.5 g/dL — ABNORMAL LOW (ref 11.1–15.9)
Immature Grans (Abs): 0 x10E3/uL (ref 0.0–0.1)
Immature Granulocytes: 0 %
Lymphocytes Absolute: 1.1 x10E3/uL (ref 0.7–3.1)
Lymphs: 25 %
MCH: 28.7 pg (ref 26.6–33.0)
MCHC: 33.2 g/dL (ref 31.5–35.7)
MCV: 86 fL (ref 79–97)
Monocytes Absolute: 0.3 x10E3/uL (ref 0.1–0.9)
Monocytes: 7 %
Neutrophils Absolute: 2.8 x10E3/uL (ref 1.4–7.0)
Neutrophils: 60 %
Platelets: 292 x10E3/uL (ref 150–450)
RBC: 3.66 x10E6/uL — ABNORMAL LOW (ref 3.77–5.28)
RDW: 14.7 % (ref 11.7–15.4)
WBC: 4.6 x10E3/uL (ref 3.4–10.8)

## 2023-09-27 LAB — CMP14+EGFR
ALT: 15 [IU]/L (ref 0–32)
AST: 20 [IU]/L (ref 0–40)
Albumin: 3.5 g/dL — ABNORMAL LOW (ref 3.9–4.9)
Alkaline Phosphatase: 134 [IU]/L — ABNORMAL HIGH (ref 44–121)
BUN/Creatinine Ratio: 10 — ABNORMAL LOW (ref 12–28)
BUN: 9 mg/dL (ref 8–27)
Bilirubin Total: 0.3 mg/dL (ref 0.0–1.2)
CO2: 20 mmol/L (ref 20–29)
Calcium: 8.9 mg/dL (ref 8.7–10.3)
Chloride: 104 mmol/L (ref 96–106)
Creatinine, Ser: 0.93 mg/dL (ref 0.57–1.00)
Globulin, Total: 3.4 g/dL (ref 1.5–4.5)
Glucose: 126 mg/dL — ABNORMAL HIGH (ref 70–99)
Potassium: 3.8 mmol/L (ref 3.5–5.2)
Sodium: 141 mmol/L (ref 134–144)
Total Protein: 6.9 g/dL (ref 6.0–8.5)
eGFR: 69 mL/min/{1.73_m2} (ref >59)

## 2023-09-28 NOTE — Assessment & Plan Note (Signed)
Tolerating PO intake fine, appears well hydrated today

## 2023-09-28 NOTE — Assessment & Plan Note (Signed)
Update CBC and CMP today Plan indefinite anticoagulation as this is her second clotting event

## 2023-09-28 NOTE — Assessment & Plan Note (Signed)
Blood pressure at goal here today Continue holding lisinopril & triamterene-hydrochlorothiazide She will continue to check at home Follow up in 1 month to recheck

## 2023-09-30 ENCOUNTER — Other Ambulatory Visit: Payer: Self-pay

## 2023-09-30 ENCOUNTER — Telehealth: Payer: Self-pay

## 2023-09-30 NOTE — Patient Outreach (Signed)
  Care Management  Transitions of Care Program Transitions of Care Post-discharge week 2  09/30/2023 Name: [name] MRN: [id number] DOB: 10/23/1960  Subjective: Sherry Anderson is a 63 y.o. year old female who is a primary care patient of [name] [name], MD. The Care Management team was unable to reach the patient by phone to assess and address transitions of care needs.   Plan: Additional outreach attempts will be made to reach the patient enrolled in the [facility] Program (Post Inpatient/ED Visit).  [name] RN, BSN, CCM [place]  Value Based Care Institute Manager Population Health Direct Dial: [phone]  Fax: [phone]

## 2023-10-03 ENCOUNTER — Telehealth: Payer: Self-pay

## 2023-10-03 NOTE — Patient Outreach (Signed)
Care Management  Transitions of Care Program Transitions of Care Post-discharge week 3   10/03/2023 Name: Sherry Anderson MRN: [id number] DOB: 09-28-1960  Subjective: Sherry Anderson is a 63 y.o. year old female who is a primary care patient of [name], [name], MD. The Care Management team Engaged with patient Engaged with patient by telephone to assess and address transitions of care needs.  Consent to Services:  Patient was given information about care management services, agreed to services, and gave verbal consent to participate.  Assessment: Successful outreach to patient this afternoon.  She notes she is doing well.  Blood pressures remain in the range of 140/80 and she had her hospital follow up with her PCP.  She is to keep monitoring her BP and notify MD if it is elevated. Patient is eating better, she has not much of an appetite in the morning.  Patient provided contact information and denied the need for further calls.  SDOH Interventions    Flowsheet Row Telephone from 10/03/2023 in [place] POPULATION HEALTH DEPARTMENT Telephone from 09/23/2023 in [place] POPULATION HEALTH DEPARTMENT Care Coordination from 07/04/2023 in [facility] Care Coordination Patient Outreach from 07/01/2023 in [place] POPULATION HEALTH DEPARTMENT Care Coordination from 05/27/2023 in [facility] Care Coordination Care Coordination from 05/10/2023 in [facility] Care Coordination  SDOH Interventions        Food Insecurity Interventions Intervention Not Indicated Intervention Not Indicated -- AMB Referral  [Would like to apply for food stamps] Intervention Not Indicated Intervention Not Indicated  Housing Interventions Intervention Not Indicated Intervention Not Indicated -- Intervention Not Indicated -- --  Transportation Interventions Intervention Not Indicated Intervention Not Indicated -- -- Intervention Not Indicated  Intervention Not Indicated  Utilities Interventions Intervention Not Indicated Intervention Not Indicated -- Intervention Not Indicated -- --  Alcohol Usage Interventions -- -- -- -- -- Intervention Not Indicated (Score <7)  Depression Interventions/Treatment  -- -- Medication -- -- Medication  Financial Strain Interventions -- -- -- Intervention Not Indicated -- Intervention Not Indicated  Physical Activity Interventions -- -- -- Intervention Not Indicated -- --  Stress Interventions -- -- -- Intervention Not Indicated -- Intervention Not Indicated  Health Literacy Interventions -- -- -- Intervention Not Indicated -- --        Goals Addressed             This Visit's Progress    COMPLETED: 30 day TOC Program       Current Barriers:  Medication management Changes with blood pressure medications Patients blood pressure remain within the range MD wanted to keep off blood pressure medications.  RNCM Clinical Goal(s):  Patient will work with the Care Management team over the next 30 days to address Transition of Care Barriers: Medication Management Provider appointments through collaboration with RN Care manager, provider, and care team.   Interventions: Evaluation of current treatment plan related to  self management and patient's adherence to plan as established by provider  Transitions of Care:  New goal. Doctor Visits  - discussed the importance of doctor visits  Patient Goals/Self-Care Activities: Participate in Transition of Care Program/Attend [facility] scheduled calls Notify RN Care Manager of TOC call rescheduling needs Take all medications as prescribed Attend all scheduled provider appointments Call pharmacy for medication refills 3-7 days in advance of running out of medications  Follow Up Plan:  The patient has been provided with contact information for the care management team and has been advised  to call with any health related questions or concerns.          Plan:  The patient has been provided with contact information for the care management team and has been advised to call with any health related questions or concerns.   [name] RN, BSN, CCM [place]  [facility] Manager Population Health Direct Dial: [phone]  Fax: [phone]

## 2023-10-03 NOTE — Patient Instructions (Signed)
Visit Information  Thank you for taking time to visit with me today. Please don't hesitate to contact me if I can be of assistance to you.   Following are the goals we discussed today:   Goals Addressed             This Visit's Progress    COMPLETED: 30 day TOC Program       Current Barriers:  Medication management Changes with blood pressure medications Patients blood pressure remain within the range MD wanted to keep off blood pressure medications.  RNCM Clinical Goal(s):  Patient will work with the Care Management team over the next 30 days to address Transition of Care Barriers: Medication Management Provider appointments through collaboration with RN Care manager, provider, and care team.   Interventions: Evaluation of current treatment plan related to  self management and patient's adherence to plan as established by provider  Transitions of Care:  New goal. Doctor Visits  - discussed the importance of doctor visits  Patient Goals/Self-Care Activities: Participate in Transition of Care Program/Attend [facility] scheduled calls Notify RN Care Manager of TOC call rescheduling needs Take all medications as prescribed Attend all scheduled provider appointments Call pharmacy for medication refills 3-7 days in advance of running out of medications  Follow Up Plan:  The patient has been provided with contact information for the care management team and has been advised to call with any health related questions or concerns.        If you are experiencing a Mental Health or Behavioral Health Crisis or need someone to talk to, please call the Suicide and Crisis Lifeline: 988 call the [place] National Suicide Prevention Lifeline: 828-647-7883 or TTY: 313-760-7511 TTY 579-598-7874) to talk to a trained counselor   Patient verbalizes understanding of instructions and care plan provided today and agrees to view in MyChart. Active MyChart status and patient understanding of how to access  instructions and care plan via MyChart confirmed with patient.     The patient has been provided with contact information for the care management team and has been advised to call with any health related questions or concerns.   [name] RN, BSN, CCM [place]  [facility] Manager Population Health Direct Dial: [phone]  Fax: [phone]

## 2023-10-05 ENCOUNTER — Telehealth: Payer: Self-pay | Admitting: Family Medicine

## 2023-10-05 NOTE — Telephone Encounter (Signed)
**   AFTER HOURS PAGE **   Patient states she had a small blood clot that came out of her nose yesterday and had another occurrence that occurred this morning.  Reports there was a black spot in her nose she then blew her nose and it started to bleed a little bit.  Taking Eliquis 5 mg twice daily.  Denies similar episodes recently.  Denies recent illness with lots of nose blowing recently.  Denies other systemic symptoms.  Recommended patient avoid touching any scabs in her nose and blowing her nose for the next week.  Can use humidifier at night when she sleeps or apply nasal saline gel/Vaseline around her nose to moisten the area.  If further recurrence of nosebleed, recommended nasal bridge pressure for minimum 5 minutes and if not stopped then she may need emergency care.  Advise she could get Afrin over-the-counter at the pharmacy to have on hand for possible recurrence.  Patient in agreement with plan and all questions answered.  She requested follow-up with Dr. [name], scheduled for 3/7 at 9:30 AM.  Elberta Fortis, DO

## 2023-10-10 ENCOUNTER — Ambulatory Visit: Payer: Self-pay | Admitting: Family Medicine

## 2023-10-10 NOTE — Telephone Encounter (Signed)
 Copied from CRM [id number]. Topic: Clinical - Red Word Triage >> Oct 10, 2023  3:57 PM Sherry Anderson wrote: Red Word that prompted transfer to Nurse Triage: pt is having high blood issues.  Pt states she feels kinda "weird" in her head. Not feeling right. Her bp is 179/92.  Previous 159/62 this morning.  Pt states she has been checking every 2 hours, and it has consistently running high.  She tried to call her dr, but no answer.  Chief Complaint: "head not feeling right" Symptoms: dizziness, elevated BP  Pertinent Negatives: Patient denies chest pain SOB  Disposition: [] ED /[] Urgent Care (no appt availability in office) / [] Appointment(In office/virtual)/ []  [place] [facility]/ [] Home Care/ [] Refused Recommended Disposition /[] [facility]/ [x]  Follow-up with PCP Additional Notes: pt see [facility]. Called office and warm transferred to [facility].   Reason for Disposition  [1] Systolic BP  >= 160 OR Diastolic >= 100 AND [2] cardiac (e.g., breathing difficulty, chest pain) or neurologic symptoms (e.g., new-onset blurred or double vision, unsteady gait)  Answer Assessment - Initial Assessment Questions 1. BLOOD PRESSURE: "What is the blood pressure?" "Did you take at least two measurements 5 minutes apart?"     179/92 159/62 2. ONSET: "When did you take your blood pressure?"     Checking every 2 hours  3. HOW: "How did you take your blood pressure?" (e.g., automatic home BP monitor, visiting nurse)     Home moniotor 4. HISTORY: "Do you have a history of high blood pressure?"     yes 5. MEDICINES: "Are you taking any medicines for blood pressure?" "Have you missed any doses recently?"     No paused 6. OTHER SYMPTOMS: "Do you have any symptoms?" (e.g., blurred vision, chest pain, difficulty breathing, headache, weakness)     Dizziness mild  Protocols used: Blood Pressure - High-A-AH

## 2023-10-10 NOTE — Telephone Encounter (Signed)
 Received VM from patient regarding elevated BP reading. She has already scheduled in ATC for tomorrow afternoon.   She reports that she is currently not taking any BP medications.   Discussed ED precautions.   Veronda Prude, RN

## 2023-10-11 ENCOUNTER — Ambulatory Visit (INDEPENDENT_AMBULATORY_CARE_PROVIDER_SITE_OTHER): Payer: Medicare HMO | Admitting: Student

## 2023-10-11 VITALS — BP 157/85 | HR 87 | Ht 62.0 in | Wt 167.4 lb

## 2023-10-11 DIAGNOSIS — I10 Essential (primary) hypertension: Secondary | ICD-10-CM

## 2023-10-11 MED ORDER — LISINOPRIL 20 MG PO TABS: 20.0000 mg | ORAL_TABLET | Freq: Every day | ORAL | 3 refills | Status: DC

## 2023-10-11 NOTE — Patient Instructions (Signed)
 Pleasure to meet you today.  Your blood pressure today was elevated.  I have restarted your lisinopril 20 mg daily.  Please make sure to check your blood pressures daily and keep a blood pressure log until we see you in 2 weeks.  Follow-up in 2 weeks.

## 2023-10-11 NOTE — Progress Notes (Signed)
    SUBJECTIVE:   CHIEF COMPLAINT / HPI:   63 year old female with history of hypertension  Presenting for concerns of elevated BP Previously on Lisinopril 20mg  daily and Triamterene-hydrochlorothiazide 37.5-25mg  but held for hypotension during hospitalization Recent hospitalization for hiatal hernia repair which was complicated by pulm emboli and GI bleed.  During this period patient was hypotensive and blood pressure medications were held. No Headache, vision changes, dyspnea, chest pain or SOB No LE edema   PERTINENT  PMH / PSH: Reviewed  OBJECTIVE:   BP (!) 157/85   Pulse 87   Ht 5\' 2"  (1.575 m)   Wt 167 lb 6.4 oz (75.9 kg)   LMP  (LMP Unknown) Comment: not sexually active  SpO2 99%   BMI 30.62 kg/m    Physical Exam General: Alert, well appearing, NAD Cardiovascular: RRR, No Murmurs, Normal S2/S2 Respiratory: CTAB, No wheezing or Rales Abdomen: No distension or tenderness Extremities: No edema on extremities    ASSESSMENT/PLAN:   HYPERTENSION, BENIGN ESSENTIAL Elevated blood pressure x 2 in the clinic.  Patient currently asymptomatic. -Restarted her lisinopril 20 mg daily. -Advised patient to keep a daily blood pressure logs -Follow-up in 2 weeks to review blood pressure logs -Return precaution discussed with patient who verbalized understanding and agreeable to plan.     Jerre Simon, MD [facility]

## 2023-10-11 NOTE — Assessment & Plan Note (Signed)
 Elevated blood pressure x 2 in the clinic.  Patient currently asymptomatic. -Restarted her lisinopril 20 mg daily. -Advised patient to keep a daily blood pressure logs -Follow-up in 2 weeks to review blood pressure logs -Return precaution discussed with patient who verbalized understanding and agreeable to plan.

## 2023-10-14 ENCOUNTER — Other Ambulatory Visit: Payer: Self-pay | Admitting: Family Medicine

## 2023-10-17 ENCOUNTER — Ambulatory Visit: Payer: Medicare HMO

## 2023-10-17 VITALS — Ht 62.0 in | Wt 167.0 lb

## 2023-10-17 DIAGNOSIS — Z0000 Encounter for general adult medical examination without abnormal findings: Secondary | ICD-10-CM

## 2023-10-17 NOTE — Patient Instructions (Addendum)
 Sherry Anderson , Thank you for taking time to come for your Medicare Wellness Visit. I appreciate your ongoing commitment to your health goals. Please review the following plan we discussed and let me know if I can assist you in the future.   Referrals/Orders/Follow-Ups/Clinician Recommendations: Yes; Keep maintaining your health by keeping your appointments with Dr. [name] and any specialists that you may see.  Call us if you need anything.  Have a great year!!!!  This is a list of the screening recommended for you and due dates:  Health Maintenance  Topic Date Due   Pneumococcal Vaccination (1 of 2 - PCV) Never done   COVID-19 Vaccine (8 - 2024-25 season) 07/07/2023   Mammogram  09/07/2024   Medicare Annual Wellness Visit  10/16/2024   Colon Cancer Screening  01/14/2026   DTaP/Tdap/Td vaccine (3 - Td or Tdap) 01/12/2027   Flu Shot  Completed   Hepatitis C Screening  Completed   HIV Screening  Completed   Zoster (Shingles) Vaccine  Completed   HPV Vaccine  Aged Out    Advanced directives: (Declined) Advance directive discussed with you today. Even though you declined this today, please call our office should you change your mind, and we can give you the proper paperwork for you to fill out.  Next Medicare Annual Wellness Visit scheduled for next year: Yes

## 2023-10-17 NOTE — Progress Notes (Signed)
 Subjective:   Sherry Anderson is a 63 y.o. who presents for a Medicare Wellness preventive visit.  Visit Complete: Virtual I connected with  Sherry Anderson on 10/17/23 by a audio enabled telemedicine application and verified that I am speaking with the correct person using two identifiers.  Patient Location: Home  Provider Location: Office/Clinic  I discussed the limitations of evaluation and management by telemedicine. The patient expressed understanding and agreed to proceed.  Vital Signs: Because this visit was a virtual/telehealth visit, some criteria may be missing or patient reported. Any vitals not documented were not able to be obtained and vitals that have been documented are patient reported.  VideoDeclined- This patient declined Librarian, academic. Therefore the visit was completed with audio only.  AWV Questionnaire: No: Patient Medicare AWV questionnaire was not completed prior to this visit.  Cardiac Risk Factors include: advanced age (>77men, >73 women);sedentary lifestyle;dyslipidemia;family history of premature cardiovascular disease;hypertension;obesity (BMI >30kg/m2)     Objective:    Today's Vitals   10/17/23 1611 10/17/23 1612  Weight: 167 lb (75.8 kg)   Height: 5\' 2"  (1.575 m)   PainSc:  0-No pain   Body mass index is 30.54 kg/m.     10/17/2023    4:14 PM 10/11/2023    2:05 PM 09/26/2023   10:37 AM 09/20/2023   12:53 AM 08/30/2023   10:20 AM 08/16/2023   11:21 AM 06/09/2023   10:05 AM  Advanced Directives  Does Patient Have a Medical Advance Directive? No No No No No No No  Would patient like information on creating a medical advance directive? No - Patient declined No - Patient declined No - Patient declined No - Patient declined No - Patient declined No - Patient declined No - Patient declined    Current Medications (verified) Outpatient Encounter Medications as of 10/17/2023  Medication Sig   apixaban (ELIQUIS)  5 MG TABS tablet Take 2 tablets (10 mg total) by mouth 2 (two) times daily for 6 days, THEN 1 tablet (5 mg total) 2 (two) times daily.   Calcium Citrate-Vitamin D (CALCIUM + D PO) Take 1 tablet by mouth daily.   Carboxymethylcellulose Sodium (THERATEARS) 0.25 % SOLN Place 1 drop into both eyes daily as needed (Dry eyes).   Cyanocobalamin (B-12 PO) Take 1,000 mcg by mouth daily.   ferrous sulfate 325 (65 FE) MG tablet Take 1 tablet (325 mg total) by mouth every other day.   levothyroxine (SYNTHROID) 75 MCG tablet Take 1 tablet (75 mcg total) by mouth every morning. 30 minutes before food   lisinopril (ZESTRIL) 20 MG tablet Take 1 tablet (20 mg total) by mouth at bedtime.   ondansetron (ZOFRAN) 4 MG tablet Take 1 tablet (4 mg total) by mouth daily as needed for nausea or vomiting. (Patient not taking: Reported on 10/03/2023)   QUEtiapine (SEROQUEL) 50 MG tablet Take 1 tablet (50 mg total) by mouth at bedtime.   [Paused] triamterene-hydrochlorothiazide (DYAZIDE) 37.5-25 MG capsule TAKE 1 CAPSULE BY MOUTH EVERY DAY (Patient not taking: Reported on 09/23/2023)   venlafaxine XR (EFFEXOR-XR) 75 MG 24 hr capsule TAKE THREE CAPSULES BY MOUTH ONCE DAILY   No facility-administered encounter medications on file as of 10/17/2023.    Allergies (verified) Patient has no known allergies.   History: 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   Associated with high-grade dysplasia   Anemia    Anxiety and depression  Arthritis    Breast cancer (HCC) 2002   left breast cancer    Cerebral aneurysm 2021   had craniotomy MVD at [facility]   Depression    Diverticulosis of colon    DVT (deep venous thrombosis) (HCC) 2021   provoked by surgery   Essential hypertension, benign    Family history of pancreatic cancer    GERD (gastroesophageal reflux disease)    Heart murmur    systolic ejection murmur   had ECHO   Hepatitis    unsure of which type   High  grade squamous intraepithelial cervical dysplasia 05/2017   Associated with adenocarcinoma in situ   History of adenomatous polyp of colon    tubular adenoma's  02-08-2017   History of gastritis    History of hiatal hernia    History of left breast cancer 2002---- per pt no recurrence   dx DCIS --- s/p  left breast lumpectomy;  per pt radiation therapy completed same year and completed antiestrogen therapy    History of radiation therapy    2002   left breast   History of trigeminal neuralgia    Hypothyroidism    Internal hemorrhoids    Pain    right ear and jaw   Personal history of radiation therapy    Pre-diabetes    Spinal stenosis    Spondylolisthesis    SVD (spontaneous vaginal delivery)    x 1   Tremor of both hands    Tubular adenoma of colon    Past Surgical History:  Procedure Laterality Date   BRAIN SURGERY  01/25/2020   craniotomy MVD at [facility]   BREAST LUMPECTOMY Left 2002   ductal CA in situ   CERVICAL CONIZATION W/BX N/A 06/08/2017   Procedure: CONIZATION CERVIX WITH BIOPSY;  Surgeon: Dara Lords, MD;  Location: [facility];  Service: Gynecology;  Laterality: N/A;   CESAREAN SECTION  1990   twins   COLONOSCOPY  02-08-2017   dr Philbert Riser   CRANIECTOMY  02/12/2020   VP shunt following craniotomy   DILATATION & CURETTAGE/HYSTEROSCOPY WITH MYOSURE N/A 06/08/2017   Procedure: DILATATION & CURETTAGE/HYSTEROSCOPY WITH MYOSURE;  Surgeon: Dara Lords, MD;  Location:  SURGERY CENTER;  Service: Gynecology;  Laterality: N/A;   ESOPHAGOGASTRODUODENOSCOPY  last one 11-24-2016   dr Philbert Riser   ROBOTIC ASSISTED TOTAL HYSTERECTOMY WITH BILATERAL SALPINGO OOPHERECTOMY N/A 09/21/2017   Procedure: XI ROBOTIC ASSISTED TOTAL HYSTERECTOMY WITH BILATERAL SALPINGO OOPHORECTOMY, Lysis of Adhesions;  Surgeon: Genia Del, MD;  Location: [facility];  Service: Gynecology;  Laterality: N/A;   TOOTH EXTRACTION     TRANSFORAMINAL LUMBAR INTERBODY  FUSION (TLIF) WITH PEDICLE SCREW FIXATION 2 LEVEL N/A 04/13/2018   Procedure: RIGHT-SIDED LUMBAR 4-5 AND LUMBAR 5-SACRUM 1 TRANSFORAMINAL LUMBAR INTERBODY FUSION WITH INSTRUMENTATION AND ALLOGRAFT;  Surgeon: Estill Bamberg, MD;  Location: [facility];  Service: Orthopedics;  Laterality: N/A;   UPPER GI ENDOSCOPY N/A 08/30/2023   Procedure: UPPER ENDOSCOPY;  Surgeon: Gaynelle Adu, MD;  Location: [facility];  Service: General;  Laterality: N/A;   WISDOM TOOTH EXTRACTION     XI ROBOTIC ASSISTED HIATAL HERNIA REPAIR N/A 08/30/2023   Procedure: XI ROBOTIC ASSISTED HIATAL HERNIA REPAIR;  Surgeon: Gaynelle Adu, MD;  Location: [facility];  Service: General;  Laterality: N/A;   Family History  Problem Relation Age of Onset   Diabetes Mother    Hypertension Mother    Hyperlipidemia Father    Heart disease Father  Pacemaker   Dementia Father    Thyroid disease Sister    Schizophrenia Brother    Pancreatic cancer Cousin 49   Social History   Socioeconomic History   Marital status: Legally Separated    Spouse name: Not on file   Number of children: 3   Years of education: 6   Highest education level: 6th grade  Occupational History   Occupation: Disability  Tobacco Use   Smoking status: Never    Passive exposure: Never   Smokeless tobacco: Never  Vaping Use   Vaping status: Never Used  Substance and Sexual Activity   Alcohol use: No   Drug use: No   Sexual activity: Not Currently    Partners: Male    Birth control/protection: Surgical    Comment: 1st intercourse 63 yo-Fewer than 5 partners, hysterectomy  Other Topics Concern   Not on file  Social History Narrative   Moved from [place] to [place], to [place] 1998   Lives at home with her nephew and her son.   Right-handed.   Occasional use of caffeine.   Social Drivers of Health   Financial Resource Strain: Medium Risk (10/17/2023)   Overall Financial Resource Strain (CARDIA)    Difficulty of Paying Living Expenses: Somewhat hard   Food Insecurity: No Food Insecurity (10/17/2023)   Hunger Vital Sign    Worried About Running Out of Food in the Last Year: Never true    Ran Out of Food in the Last Year: Never true  Transportation Needs: No Transportation Needs (10/17/2023)   PRAPARE - Administrator, Civil Service (Medical): No    Lack of Transportation (Non-Medical): No  Physical Activity: Inactive (10/17/2023)   Exercise Vital Sign    Days of Exercise per Week: 0 days    Minutes of Exercise per Session: 0 min  Stress: Stress Concern Present (10/17/2023)   [employer] of Occupational Health - Occupational Stress Questionnaire    Feeling of Stress : To some extent  Social Connections: Socially Isolated (10/17/2023)   Social Connection and Isolation Panel [NHANES]    Frequency of Communication with Friends and Family: Never    Frequency of Social Gatherings with Friends and Family: Never    Attends Religious Services: Never    Database administrator or Organizations: Yes    Attends Engineer, structural: 1 to 4 times per year    Marital Status: Never married    Tobacco Counseling Counseling given: Not Answered    Clinical Intake:  Pre-visit preparation completed: Yes  Pain : No/denies pain Pain Score: 0-No pain     BMI - recorded: 30.54 Nutritional Status: BMI > 30  Obese Nutritional Risks: None Diabetes: No  How often do you need to have someone help you when you read instructions, pamphlets, or other written materials from your doctor or pharmacy?: 1 - Never What is the last grade level you completed in school?: 6TH GRADE  Interpreter Needed?: No  Information entered by :: Sherry Lo N. Eveny Anastas, LPN.   Activities of Daily Living     10/17/2023    4:30 PM 09/20/2023   12:57 AM  In your present state of health, do you have any difficulty performing the following activities:  Hearing? 0   Vision? 0   Difficulty concentrating or making decisions? 1   Walking or climbing stairs?  0   Dressing or bathing? 0   Doing errands, shopping? 0 0  Preparing Food and eating ? N  Using the Toilet? N   In the past six months, have you accidently leaked urine? N   Do you have problems with loss of bowel control? N   Managing your Medications? N   Managing your Finances? N   Housekeeping or managing your Housekeeping? N     Patient Care Team: Latrelle Dodrill, MD as PCP - General (Family Medicine) Marykay Lex, MD as PCP - Cardiology (Cardiology) Drema Dallas, DO as Consulting Physician (Neurology) Zettie Pho, [facility] (Inactive) as Pharmacist (Pharmacist) Genia Del, MD as Consulting Physician (Obstetrics and Gynecology) Ricky Stabs, RN as VBCI Care Management  Indicate any recent Medical Services you may have received from other than [facility] providers in the past year (date may be approximate).     Assessment:   This is a routine wellness examination for Sherry Anderson.  Hearing/Vision screen Hearing Screening - Comments:: Patient has decreased hearing. No hearing aids.   Vision Screening - Comments:: Wears otc reading glasses - not up to date with routine eye exam. Patient request referral to ophthalmologist.     Goals Addressed   None    Depression Screen     10/17/2023    4:16 PM 09/26/2023   10:37 AM 07/05/2023    8:34 AM 07/04/2023    2:02 PM 07/01/2023    3:01 PM 07/01/2023    3:00 PM 06/09/2023   10:15 AM  PHQ 2/9 Scores  PHQ - 2 Score 6 0 0 0 0 0 6  PHQ- 9 Score 19 3 4 6   18     Fall Risk     10/17/2023    4:15 PM 10/11/2023    1:48 PM 09/26/2023   10:37 AM 07/05/2023    8:34 AM 05/27/2023   10:37 AM  Fall Risk   Falls in the past year? 0 0 0 0 0  Number falls in past yr: 0 0 0 0   Injury with Fall? 0 0 0 0   Risk for fall due to : No Fall Risks  History of fall(s)    Follow up Falls prevention discussed;Falls evaluation completed  Falls evaluation completed      MEDICARE RISK AT HOME:  Medicare Risk at Home Any  stairs in or around the home?: Yes If so, are there any without handrails?: No Home free of loose throw rugs in walkways, pet beds, electrical cords, etc?: Yes Adequate lighting in your home to reduce risk of falls?: Yes Life alert?: No Use of a cane, walker or w/c?: No Grab bars in the bathroom?: No Shower chair or bench in shower?: Yes Elevated toilet seat or a handicapped toilet?: No  TIMED UP AND GO:  Was the test performed?  No  Cognitive Function: 6CIT completed    10/17/2023    4:31 PM  MMSE - Mini Mental State Exam  Not completed: Unable to complete        10/17/2023    4:31 PM 10/08/2022    8:46 AM  6CIT Screen  What Year? 0 points 0 points  What month? 0 points 0 points  What time? 0 points 0 points  Count back from 20 0 points 0 points  Months in reverse 0 points 0 points  Repeat phrase 0 points 10 points  Total Score 0 points 10 points    Immunizations Immunization History  Administered Date(s) Administered   Influenza Split 10/24/2012   Influenza Whole 07/18/2007, 05/29/2008   Influenza, Seasonal, Injecte, Preservative Fre 05/12/2023  Influenza,inj,Quad PF,6+ Mos 06/18/2014, 05/20/2017, 07/19/2018, 08/23/2019, 05/12/2020, 06/11/2021   Influenza-Unspecified 06/16/2022   PFIZER Comirnaty(Gray Top)Covid-19 Tri-Sucrose Vaccine 12/11/2020   PFIZER(Purple Top)SARS-COV-2 Vaccination 11/03/2019, 11/24/2019, 07/08/2020   Pfizer Covid-19 Vaccine Bivalent Booster 67yrs & up 06/11/2021   Pfizer(Comirnaty)Fall Seasonal Vaccine 12 years and older 08/30/2022, 05/12/2023   Td 11/14/2005   Tdap 01/11/2017   Zoster Recombinant(Shingrix) 05/12/2020, 03/02/2021    Screening Tests Health Maintenance  Topic Date Due   Pneumococcal Vaccine 21-52 Years old (1 of 2 - PCV) Never done   COVID-19 Vaccine (8 - 2024-25 season) 07/07/2023   MAMMOGRAM  09/07/2024   Medicare Annual Wellness (AWV)  10/16/2024   Colonoscopy  01/14/2026   DTaP/Tdap/Td (3 - Td or Tdap) 01/12/2027    INFLUENZA VACCINE  Completed   Hepatitis C Screening  Completed   HIV Screening  Completed   Zoster Vaccines- Shingrix  Completed   HPV VACCINES  Aged Out    Health Maintenance  Health Maintenance Due  Topic Date Due   Pneumococcal Vaccine 74-76 Years old (1 of 2 - PCV) Never done   COVID-19 Vaccine (8 - 2024-25 season) 07/07/2023   Health Maintenance Items Addressed: Yes Patient advised she is due for Pneumonia and Covid vaccine.  Additional Screening:  Vision Screening: Recommended annual ophthalmology exams for early detection of glaucoma and other disorders of the eye.  Dental Screening: Recommended annual dental exams for proper oral hygiene  Community Resource Referral / Chronic Care Management: CRR required this visit?  No   CCM required this visit?  No     Plan:     I have personally reviewed and noted the following in the patient's chart:   Medical and social history Use of alcohol, tobacco or illicit drugs  Current medications and supplements including opioid prescriptions. Patient is not currently taking opioid prescriptions. Functional ability and status Nutritional status Physical activity Advanced directives List of other physicians Hospitalizations, surgeries, and ER visits in previous 12 months Vitals Screenings to include cognitive, depression, and falls Referrals and appointments  In addition, I have reviewed and discussed with patient certain preventive protocols, quality metrics, and best practice recommendations. A written personalized care plan for preventive services as well as general preventive health recommendations were provided to patient.     Mickeal Needy, LPN   08/19/863   After Visit Summary: (MyChart) Due to this being a telephonic visit, the after visit summary with patients personalized plan was offered to patient via MyChart   Notes: Please refer to Routing Comments.

## 2023-10-18 ENCOUNTER — Encounter: Payer: Self-pay | Admitting: Family Medicine

## 2023-10-18 NOTE — Telephone Encounter (Signed)
 Msgd patient to clarify if she is taking this since not on medication list  Latrelle Dodrill, MD

## 2023-10-21 ENCOUNTER — Ambulatory Visit (INDEPENDENT_AMBULATORY_CARE_PROVIDER_SITE_OTHER): Payer: Medicare HMO | Admitting: Family Medicine

## 2023-10-21 VITALS — BP 125/80 | HR 92 | Temp 98.1°F | Wt 168.2 lb

## 2023-10-21 DIAGNOSIS — R0602 Shortness of breath: Secondary | ICD-10-CM

## 2023-10-21 DIAGNOSIS — F39 Unspecified mood [affective] disorder: Secondary | ICD-10-CM | POA: Diagnosis not present

## 2023-10-21 DIAGNOSIS — R400 Somnolence: Secondary | ICD-10-CM | POA: Diagnosis not present

## 2023-10-21 DIAGNOSIS — I10 Essential (primary) hypertension: Secondary | ICD-10-CM | POA: Diagnosis not present

## 2023-10-21 DIAGNOSIS — K55069 Acute infarction of intestine, part and extent unspecified: Secondary | ICD-10-CM

## 2023-10-21 DIAGNOSIS — Z711 Person with feared health complaint in whom no diagnosis is made: Secondary | ICD-10-CM

## 2023-10-21 NOTE — Patient Instructions (Signed)
 It was great to see you again today.  EKG looks good Go get chest xray  Ordered sleep study  Referring to eye doctor Checking bloodwork  Follow up with me at next available appointment   Be well, Dr. [name]

## 2023-10-21 NOTE — Progress Notes (Signed)
  Date of Visit: 10/21/2023   SUBJECTIVE:   HPI:  Sherry Anderson presents today for follow-up.  Hypertension: Currently taking lisinopril 20 mg daily.  Her triamterene-HCTZ has continued to be on hold since her recent hospitalization.  Lisinopril was restarted at recent visit due to elevated blood pressures.  Eye doctor referral: Reports her eyes are dry.  Wants to see eye doctor.  Endorses occasional blurry vision but overall not severe.  Mood: Currently taking venlafaxine XR 225 mg daily.  Reports mood is doing well on this regimen.  Anemia: Last hemoglobin was 10.5.  She is taking iron every other day.  Denies blood in her stool or any bleeding appreciated on her current dose of Eliquis.  At end of visit after already addressing multiple issues, and after having elicited all concerns at the beginning of the visit, patient brought up that she has felt short of breath while sleeping, and also on further questioning admits to this happening during the day as well.  Denies any edema.  Endorses vague chest discomfort on occasion.   OBJECTIVE:   BP 125/80   Pulse 92   Temp 98.1 F (36.7 C)   Wt 168 lb 3.2 oz (76.3 kg)   LMP  (LMP Unknown) Comment: not sexually active  SpO2 92%   BMI 30.76 kg/m  Gen: No acute distress, pleasant, cooperative, well-appearing HEENT: Normocephalic, atraumatic Heart: Regular rate and rhythm, no murmur Lungs: Clear to auscultation bilaterally, normal effort Neuro: Grossly nonfocal, speech normal Psych: normal range of affect, well groomed, speech normal in rate and volume, normal eye contact  Ext: No edema  ASSESSMENT/PLAN:   Assessment & Plan Mesenteric vein thrombosis (HCC) Update CBC given report of shortness of breath, otherwise tolerating eliquis well Shortness of breath Brought up at very end of visit despite attempting to agenda set earlier in the visit, specifically having asked if patient had any additional concerns at the beginning of visit.   Time was thus limited for addressing this issue.  Ambulated in clinic with no hypoxia on ambulation (O2 sat remained 95% and above on room air while ambulating).  EKG done today and shows no signs of acute ischemia.  Recently had coronary CT in fall 2024 with coronary calcium score of 0.  Given predominant symptoms at night while sleeping, stronger suspicion for possible OSA.  Sleep study ordered.  Also check chest x-ray.  Follow-up for separate visit to continue addressing this issue. Daytime sleepiness Sleep study ordered Concern about eye disease without diagnosis Dry eyes, requesting to see ophthalmology.  Referral placed. Mood disorder (HCC) Stable, continue venlafaxine HYPERTENSION, BENIGN ESSENTIAL Well controlled Will formally stop triamterene-hydrochlorothiazide, continue lisinopril    [place] J. [name], MD [facility]

## 2023-10-23 LAB — BASIC METABOLIC PANEL
BUN/Creatinine Ratio: 17 (ref 12–28)
BUN: 13 mg/dL (ref 8–27)
CO2: 22 mmol/L (ref 20–29)
Calcium: 9.4 mg/dL (ref 8.7–10.3)
Chloride: 105 mmol/L (ref 96–106)
Creatinine, Ser: 0.78 mg/dL (ref 0.57–1.00)
Glucose: 87 mg/dL (ref 70–99)
Potassium: 4.1 mmol/L (ref 3.5–5.2)
Sodium: 143 mmol/L (ref 134–144)
eGFR: 85 mL/min/{1.73_m2} (ref >59)

## 2023-10-23 LAB — CBC
Hematocrit: 36.2 % (ref 34.0–46.6)
Hemoglobin: 12.5 g/dL (ref 11.1–15.9)
MCH: 30.7 pg (ref 26.6–33.0)
MCHC: 34.5 g/dL (ref 31.5–35.7)
MCV: 89 fL (ref 79–97)
Platelets: 171 10*3/uL (ref 150–450)
RBC: 4.07 x10E6/uL (ref 3.77–5.28)
RDW: 17.1 % — ABNORMAL HIGH (ref 11.7–15.4)
WBC: 3.8 10*3/uL (ref 3.4–10.8)

## 2023-10-23 LAB — BRAIN NATRIURETIC PEPTIDE: BNP: 36.8 pg/mL (ref 0.0–100.0)

## 2023-10-23 NOTE — Assessment & Plan Note (Signed)
Stable, continue venlafaxine.

## 2023-10-23 NOTE — Assessment & Plan Note (Signed)
 Well controlled Will formally stop triamterene-hydrochlorothiazide, continue lisinopril

## 2023-10-23 NOTE — Assessment & Plan Note (Signed)
 Sleep study ordered

## 2023-10-23 NOTE — Assessment & Plan Note (Signed)
 Update CBC given report of shortness of breath, otherwise tolerating eliquis well

## 2023-10-23 NOTE — Assessment & Plan Note (Signed)
 Brought up at very end of visit despite attempting to agenda set earlier in the visit, specifically having asked if patient had any additional concerns at the beginning of visit.  Time was thus limited for addressing this issue.  Ambulated in clinic with no hypoxia on ambulation (O2 sat remained 95% and above on room air while ambulating).  EKG done today and shows no signs of acute ischemia.  Recently had coronary CT in fall 2024 with coronary calcium score of 0.  Given predominant symptoms at night while sleeping, stronger suspicion for possible OSA.  Sleep study ordered.  Also check chest x-ray.  Follow-up for separate visit to continue addressing this issue.

## 2023-10-27 ENCOUNTER — Telehealth: Payer: Self-pay

## 2023-10-27 NOTE — Telephone Encounter (Signed)
 Patient calls nurse line reporting high blood pressure.   She reports she began to feel funny after lunch time. She reports a headache and overall "funny weird" feeling.   She reports she just checked her BP and 172/100. She reports this made her anxious and prompted her to call PCP.   She denies any vision changes, chest pains or shortness of breath.   Patient reports compliance with Lisinopril.   Patient advised an evaluation would be best given BP and symptoms. We do not have any apts until Monday.   Patient advised to go to UC. She agreed with plan.   Patient to call us in the morning to FU.

## 2023-10-28 NOTE — Telephone Encounter (Signed)
 Agree with in person eval to recheck blood pressure  If remains elevated we can restart her triamterene-hydrochlorothiazide that was held at her most recent hospitalization Latrelle Dodrill, MD

## 2023-10-29 ENCOUNTER — Other Ambulatory Visit: Payer: Self-pay | Admitting: Family Medicine

## 2023-10-31 ENCOUNTER — Other Ambulatory Visit: Payer: Self-pay

## 2023-11-02 ENCOUNTER — Inpatient Hospital Stay: Payer: Medicare HMO

## 2023-11-02 ENCOUNTER — Inpatient Hospital Stay: Payer: Medicare HMO | Attending: Hematology | Admitting: Hematology

## 2023-11-02 VITALS — BP 140/73 | HR 73 | Temp 97.5°F | Resp 18 | Ht 62.0 in | Wt 168.3 lb

## 2023-11-02 DIAGNOSIS — R0602 Shortness of breath: Secondary | ICD-10-CM | POA: Insufficient documentation

## 2023-11-02 DIAGNOSIS — K449 Diaphragmatic hernia without obstruction or gangrene: Secondary | ICD-10-CM | POA: Diagnosis not present

## 2023-11-02 DIAGNOSIS — Z7901 Long term (current) use of anticoagulants: Secondary | ICD-10-CM | POA: Diagnosis not present

## 2023-11-02 DIAGNOSIS — R5383 Other fatigue: Secondary | ICD-10-CM | POA: Insufficient documentation

## 2023-11-02 DIAGNOSIS — Z86718 Personal history of other venous thrombosis and embolism: Secondary | ICD-10-CM | POA: Diagnosis not present

## 2023-11-02 DIAGNOSIS — Z853 Personal history of malignant neoplasm of breast: Secondary | ICD-10-CM | POA: Diagnosis not present

## 2023-11-02 DIAGNOSIS — F32A Depression, unspecified: Secondary | ICD-10-CM | POA: Insufficient documentation

## 2023-11-02 DIAGNOSIS — E611 Iron deficiency: Secondary | ICD-10-CM | POA: Diagnosis not present

## 2023-11-02 DIAGNOSIS — Z800 Family history of malignant neoplasm of digestive organs: Secondary | ICD-10-CM | POA: Insufficient documentation

## 2023-11-02 DIAGNOSIS — M549 Dorsalgia, unspecified: Secondary | ICD-10-CM | POA: Diagnosis not present

## 2023-11-02 DIAGNOSIS — R760 Raised antibody titer: Secondary | ICD-10-CM | POA: Insufficient documentation

## 2023-11-02 DIAGNOSIS — M25519 Pain in unspecified shoulder: Secondary | ICD-10-CM | POA: Insufficient documentation

## 2023-11-02 DIAGNOSIS — I81 Portal vein thrombosis: Secondary | ICD-10-CM | POA: Diagnosis not present

## 2023-11-02 LAB — CBC WITH DIFFERENTIAL (CANCER CENTER ONLY)
Abs Immature Granulocytes: 0.01 10*3/uL (ref 0.00–0.07)
Basophils Absolute: 0 10*3/uL (ref 0.0–0.1)
Basophils Relative: 1 %
Eosinophils Absolute: 0.2 10*3/uL (ref 0.0–0.5)
Eosinophils Relative: 5 %
HCT: 36.1 % (ref 36.0–46.0)
Hemoglobin: 12 g/dL (ref 12.0–15.0)
Immature Granulocytes: 0 %
Lymphocytes Relative: 37 %
Lymphs Abs: 1.3 10*3/uL (ref 0.7–4.0)
MCH: 30.1 pg (ref 26.0–34.0)
MCHC: 33.2 g/dL (ref 30.0–36.0)
MCV: 90.5 fL (ref 80.0–100.0)
Monocytes Absolute: 0.3 10*3/uL (ref 0.1–1.0)
Monocytes Relative: 9 %
Neutro Abs: 1.6 10*3/uL — ABNORMAL LOW (ref 1.7–7.7)
Neutrophils Relative %: 48 %
Platelet Count: 165 10*3/uL (ref 150–400)
RBC: 3.99 MIL/uL (ref 3.87–5.11)
RDW: 16.4 % — ABNORMAL HIGH (ref 11.5–15.5)
WBC Count: 3.4 10*3/uL — ABNORMAL LOW (ref 4.0–10.5)
nRBC: 0 % (ref 0.0–0.2)

## 2023-11-02 LAB — CMP (CANCER CENTER ONLY)
ALT: 17 U/L (ref 0–44)
AST: 18 U/L (ref 15–41)
Albumin: 4.1 g/dL (ref 3.5–5.0)
Alkaline Phosphatase: 77 U/L (ref 38–126)
Anion gap: 8 (ref 5–15)
BUN: 15 mg/dL (ref 8–23)
CO2: 26 mmol/L (ref 22–32)
Calcium: 9.2 mg/dL (ref 8.9–10.3)
Chloride: 107 mmol/L (ref 98–111)
Creatinine: 0.69 mg/dL (ref 0.44–1.00)
GFR, Estimated: >60 mL/min (ref >60)
Glucose, Bld: 91 mg/dL (ref 70–99)
Potassium: 3.5 mmol/L (ref 3.5–5.1)
Sodium: 141 mmol/L (ref 135–145)
Total Bilirubin: 0.6 mg/dL (ref 0.0–1.2)
Total Protein: 7.5 g/dL (ref 6.5–8.1)

## 2023-11-02 LAB — IRON AND IRON BINDING CAPACITY (CC-WL,HP ONLY)
Iron: 81 ug/dL (ref 28–170)
Saturation Ratios: 18 % (ref 10.4–31.8)
TIBC: 451 ug/dL — ABNORMAL HIGH (ref 250–450)
UIBC: 370 ug/dL (ref 148–442)

## 2023-11-02 LAB — ANTITHROMBIN III: AntiThromb III Func: 108 % (ref 75–120)

## 2023-11-02 LAB — FERRITIN: Ferritin: 28 ng/mL (ref 11–307)

## 2023-11-02 LAB — VITAMIN B12: Vitamin B-12: 4607 pg/mL — ABNORMAL HIGH (ref 180–914)

## 2023-11-02 NOTE — Progress Notes (Signed)
 HEMATOLOGY/ONCOLOGY CONSULTATION NOTE  Date of Service: 11/02/2023  Patient Care Team: Latrelle Dodrill, MD as PCP - General (Family Medicine) Marykay Lex, MD as PCP - Cardiology (Cardiology) Drema Dallas, DO as Consulting Physician (Neurology) Genia Del, MD as Consulting Physician (Obstetrics and Gynecology) Ricky Stabs, RN as VBCI Care Management  CHIEF COMPLAINTS/PURPOSE OF CONSULTATION:   Evaluation and management of portal vein thrombosis  HISTORY OF PRESENTING ILLNESS:   Sherry Anderson is a wonderful 63 y.o. female who has been referred to [place] by Dr. Levert Feinstein, MD for a discussion whether indefinite anticoagulation is warranted. The pt reports that she is doing well overall.  The pt reports that recently she has not been feeling well. She notes major fatigue and SOB. The pt claims that she is unsure as to why she feels this way, noting that fatigue has caused major sweats. The pt reports she had a surgery in June that helped with the trigeminal nerve pain. They noticed a clot in her LE in  August 2021 and was put on Eliquis. A few months later, the pt notes major SOB that came on unexpectedly after minor movement. They discovered a minor lung PE during this that they deemed unprovoked. The pt notes this SOB was new and she has not been as active recently. These symptoms have not resolved since. The pt is on a blood thinner currently and it was thought the Eliquis was not working, thus switching her to Lovenox. This pt also has a major Hiatal Hernia currently that is great in size. The pt notes that due to her symptoms she is not moving around much and is fairly immobile. The pt was also found to be iron deficient and was put on ferrous Sulfate 325 mg BID. She has stayed consistent with this and notes no issues tolerating.  The pt also noted that her PCP recommended for the pt to see a Psychiatrist for management of her depression medication and  management of her fatigue, sweats, and SOB. The pt has not talked to her Psychiatrist recently due to the stigma surrounding it and her beliefs regarding the situation.  The pt notes that she has two pregnancies and three kids, no miscarriages. The pt denies any use or birth control ever. She notes no issues regarding her pregnancies. She notes her mother has a history of being on Eliquis, but is unsure of the reason for this. She notes no familial blood disorders or autoimmune diseases.  On review of systems, pt reports depression, fatigue, SOB, sweats, neck/shoulder pain, floating sensation in head and denies constipation, diarrhea, lower back pain, abdominal pain, leg swelling, dizziness, lightheadedness, headaches, spinning feeling, sudden weight loss, decreased appetite, and any other symptoms.  INTERVAL HISTORY:  Sherry Anderson is a 63 y.o. female here for returns for evaluation and management of portal vein thrombosis.   She was initially seen by me on 10/14/2020 for anticoagulation management.   Patient had hiatal hernia repair on January 14th, 2025. She she not have any new medical issues prior to her surgery. However, she did endorse severe pain in the upper abdomen and back 2 weeks after her surgery. Patient was found to have blood clots in the veins draining into the liver while being on blood thinners around the time of her surgery. Patient has no abdominal pain at this time. She is on eliquis currently. Patient reports no other issues in regards to tolerating blood thinners.   Patient reports  having darker stools from oral iron. She is otherwise tolerating oral iron with no toxicity issues.   She reports that her hiatal hernia surgery somewhat improved her SOB, though she continues to be SOB sometimes. Patient no longer has acid reflux issues. She reports that taking a multivitamin previously caused reflux issues.   Patient was noted to previously have Trigeminal neuralgia surgery  which did provoke a previous blood clot. She did not have any blood clots with her previous hysterectomy or other surgeries besides her brain surgery. Patient was not taking any hormonal pills when she had her blood clots. She did have three pregnancies in the past, which did not provoke any blood clots. Patient has three sons who have never had blood clotting issues. Patient notes that one of her children is in the [employer] and another is in the marines.   Patient notes that her mother was on Eliquis, though she is unsure of the reason.   She reports endorsing mild weight loss after her recent surgery, but denies any sudden weight loss otherwise. Her weight in clinic today is 168 pounds.   Patient complains of sleeping issues and is unsure of the cause. She tries to nap during the afternoons and notes feeling dizzy when she does not take naps in the afternoon.   She reports endorsing back pain including when walking yesterday. Patient notes having back surgery in the past.   She complains of constant fatigue. Patient denies any leg swelling.   Patient has been on thyroid mediation and complains of depression. She reports that she generally isolates herself from others.  Her PCP is Dr. [name] and she reports that she has discussed considerations of connecting with a psychiatrist.   She complains of lack of appetitie in the mornings with improved appetite around 1 pm. She generally awakens several times during the night including to use the restroom.   She is scheduled to have a sleep study to ensure that she does not have sleep apnea.   Besides oral iron, she is also taking vitamin B12, calcium, vitamin D, and vitamin C. Patient is not taking vitamin B complex or a multivitamin.   She has been regularly following with her PCP for regular mammograms and pap smears. She reports that her most recent mammogram from last year showed normal findings.   She reports that when she was [age] years old, she  had hepatitis, though she is unsure of the type. Patient notes that she was living in [place] at the time.   MEDICAL HISTORY:  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   Associated with high-grade dysplasia   Anemia    Anxiety and depression    Arthritis    Breast cancer (HCC) 2002   left breast cancer    Cerebral aneurysm 2021   had [facility]   Depression    Diverticulosis of colon    DVT (deep venous thrombosis) (HCC) 2021   provoked by surgery   Essential hypertension, benign    Family history of pancreatic cancer    GERD (gastroesophageal reflux disease)    Heart murmur    systolic ejection murmur   had ECHO   Hepatitis    unsure of which type   High grade squamous intraepithelial cervical dysplasia 05/2017   Associated with adenocarcinoma in situ   History of adenomatous polyp of colon    tubular adenoma's  02-08-2017   History of  gastritis    History of hiatal hernia    History of left breast cancer 2002---- per pt no recurrence   dx DCIS --- s/p  left breast lumpectomy;  per pt radiation therapy completed same year and completed antiestrogen therapy    History of radiation therapy    2002   left breast   History of trigeminal neuralgia    Hypothyroidism    Internal hemorrhoids    Pain    right ear and jaw   Personal history of radiation therapy    Pre-diabetes    Spinal stenosis    Spondylolisthesis    SVD (spontaneous vaginal delivery)    x 1   Tremor of both hands    Tubular adenoma of colon     SURGICAL HISTORY: Past Surgical History:  Procedure Laterality Date   BRAIN SURGERY  01/25/2020   craniotomy MVD at [facility]   BREAST LUMPECTOMY Left 2002   ductal CA in situ   CERVICAL CONIZATION W/BX N/A 06/08/2017   Procedure: CONIZATION CERVIX WITH BIOPSY;  Surgeon: Dara Lords, MD;  Location: [facility];  Service: Gynecology;  Laterality: N/A;   CESAREAN SECTION   1990   twins   COLONOSCOPY  02-08-2017   dr Philbert Riser   CRANIECTOMY  02/12/2020   VP shunt following craniotomy   DILATATION & CURETTAGE/HYSTEROSCOPY WITH MYOSURE N/A 06/08/2017   Procedure: DILATATION & CURETTAGE/HYSTEROSCOPY WITH MYOSURE;  Surgeon: Dara Lords, MD;  Location: [facility];  Service: Gynecology;  Laterality: N/A;   ESOPHAGOGASTRODUODENOSCOPY  last one 11-24-2016   dr Philbert Riser   ROBOTIC ASSISTED TOTAL HYSTERECTOMY WITH BILATERAL SALPINGO OOPHERECTOMY N/A 09/21/2017   Procedure: XI ROBOTIC ASSISTED TOTAL HYSTERECTOMY WITH BILATERAL SALPINGO OOPHORECTOMY, Lysis of Adhesions;  Surgeon: Genia Del, MD;  Location: [facility];  Service: Gynecology;  Laterality: N/A;   TOOTH EXTRACTION     TRANSFORAMINAL LUMBAR INTERBODY FUSION (TLIF) WITH PEDICLE SCREW FIXATION 2 LEVEL N/A 04/13/2018   Procedure: RIGHT-SIDED LUMBAR 4-5 AND LUMBAR 5-SACRUM 1 TRANSFORAMINAL LUMBAR INTERBODY FUSION WITH INSTRUMENTATION AND ALLOGRAFT;  Surgeon: Estill Bamberg, MD;  Location: [facility];  Service: Orthopedics;  Laterality: N/A;   UPPER GI ENDOSCOPY N/A 08/30/2023   Procedure: UPPER ENDOSCOPY;  Surgeon: Gaynelle Adu, MD;  Location: [facility];  Service: General;  Laterality: N/A;   WISDOM TOOTH EXTRACTION     XI ROBOTIC ASSISTED HIATAL HERNIA REPAIR N/A 08/30/2023   Procedure: XI ROBOTIC ASSISTED HIATAL HERNIA REPAIR;  Surgeon: Gaynelle Adu, MD;  Location: [facility];  Service: General;  Laterality: N/A;    SOCIAL HISTORY: Social History   Socioeconomic History   Marital status: Legally Separated    Spouse name: Not on file   Number of children: 3   Years of education: 6   Highest education level: 6th grade  Occupational History   Occupation: Disability  Tobacco Use   Smoking status: Never    Passive exposure: Never   Smokeless tobacco: Never  Vaping Use   Vaping status: Never Used  Substance and Sexual Activity   Alcohol use: No   Drug use: No   Sexual activity: Not Currently     Partners: Male    Birth control/protection: Surgical    Comment: 1st intercourse 63 yo-Fewer than 5 partners, hysterectomy  Other Topics Concern   Not on file  Social History Narrative   Moved from [place] to [place], to [place] 1998   Lives at home with her nephew and her son.   Right-handed.  Occasional use of caffeine.   Social Drivers of Health   Financial Resource Strain: Medium Risk (10/17/2023)   Overall Financial Resource Strain (CARDIA)    Difficulty of Paying Living Expenses: Somewhat hard  Food Insecurity: No Food Insecurity (10/17/2023)   Hunger Vital Sign    Worried About Running Out of Food in the Last Year: Never true    Ran Out of Food in the Last Year: Never true  Transportation Needs: No Transportation Needs (10/17/2023)   PRAPARE - Administrator, Civil Service (Medical): No    Lack of Transportation (Non-Medical): No  Physical Activity: Inactive (10/17/2023)   Exercise Vital Sign    Days of Exercise per Week: 0 days    Minutes of Exercise per Session: 0 min  Stress: Stress Concern Present (10/17/2023)   [employer] of Occupational Health - Occupational Stress Questionnaire    Feeling of Stress : To some extent  Social Connections: Socially Isolated (10/17/2023)   Social Connection and Isolation Panel [NHANES]    Frequency of Communication with Friends and Family: Never    Frequency of Social Gatherings with Friends and Family: Never    Attends Religious Services: Never    Database administrator or Organizations: Yes    Attends Banker Meetings: 1 to 4 times per year    Marital Status: Never married  Intimate Partner Violence: Not At Risk (10/17/2023)   Humiliation, Afraid, Rape, and Kick questionnaire    Fear of Current or Ex-Partner: No    Emotionally Abused: No    Physically Abused: No    Sexually Abused: No    FAMILY HISTORY: Family History  Problem Relation Age of Onset   Diabetes Mother    Hypertension Mother     Hyperlipidemia Father    Heart disease Father        Pacemaker   Dementia Father    Thyroid disease Sister    Schizophrenia Brother    Pancreatic cancer Cousin 95    ALLERGIES:  has no known allergies.  MEDICATIONS:  Current Outpatient Medications  Medication Sig Dispense Refill   apixaban (ELIQUIS) 5 MG TABS tablet Take 2 tablets (10 mg total) by mouth 2 (two) times daily for 6 days, THEN 1 tablet (5 mg total) 2 (two) times daily. 84 tablet 0   Calcium Citrate-Vitamin D (CALCIUM + D PO) Take 1 tablet by mouth daily.     Carboxymethylcellulose Sodium (THERATEARS) 0.25 % SOLN Place 1 drop into both eyes daily as needed (Dry eyes).     Cyanocobalamin (B-12 PO) Take 1,000 mcg by mouth daily.     ferrous sulfate 325 (65 FE) MG tablet Take 1 tablet (325 mg total) by mouth every other day. (Patient taking differently: Take 325 mg by mouth daily.) 45 tablet 1   levothyroxine (SYNTHROID) 75 MCG tablet Take 1 tablet (75 mcg total) by mouth every morning. 30 minutes before food 90 tablet 3   lisinopril (ZESTRIL) 20 MG tablet Take 1 tablet (20 mg total) by mouth at bedtime. 90 tablet 3   pantoprazole (PROTONIX) 40 MG tablet TAKE 1 TABLET BY MOUTH EVERY DAY 90 tablet 1   QUEtiapine (SEROQUEL) 50 MG tablet Take 1 tablet (50 mg total) by mouth at bedtime. 90 tablet 2   venlafaxine XR (EFFEXOR-XR) 75 MG 24 hr capsule TAKE THREE CAPSULES BY MOUTH ONCE DAILY 270 capsule 2   No current facility-administered medications for this visit.    REVIEW OF SYSTEMS:  10 Point review of Systems was done is negative except as noted above.   PHYSICAL EXAMINATION: ECOG PERFORMANCE STATUS: 2 - Symptomatic, <50% confined to bed  . Vitals:   11/02/23 0942  BP: (!) 140/73  Pulse: 73  Resp: 18  Temp: (!) 97.5 F (36.4 C)  SpO2: 98%    Filed Weights   11/02/23 0942  Weight: 168 lb 4.8 oz (76.3 kg)   .Body mass index is 30.78 kg/m. GENERAL:alert, in no acute distress and comfortable SKIN: no acute  rashes, no significant lesions EYES: conjunctiva are pink and non-injected, sclera anicteric OROPHARYNX: MMM, no exudates, no oropharyngeal erythema or ulceration NECK: supple, no JVD LYMPH:  no palpable lymphadenopathy in the cervical, axillary or inguinal regions LUNGS: clear to auscultation b/l with normal respiratory effort HEART: regular rate & rhythm ABDOMEN:  normoactive bowel sounds , non tender, not distended. Extremity: no pedal edema PSYCH: alert & oriented x 3 with fluent speech NEURO: no focal motor/sensory deficits   LABORATORY DATA:  I have reviewed the data as listed  .    Latest Ref Rng & Units 11/02/2023   10:49 AM 10/21/2023   10:54 AM 09/26/2023   11:04 AM  CBC  WBC 4.0 - 10.5 K/uL 3.4  3.8  4.6   Hemoglobin 12.0 - 15.0 g/dL 01.0  93.2  35.5   Hematocrit 36.0 - 46.0 % 36.1  36.2  31.6   Platelets 150 - 400 K/uL 165  171  292     .    Latest Ref Rng & Units 11/02/2023   10:49 AM 10/21/2023   10:54 AM 09/26/2023   11:04 AM  CMP  Glucose 70 - 99 mg/dL 91  87  732   BUN 8 - 23 mg/dL 15  13  9    Creatinine 0.44 - 1.00 mg/dL 2.02  5.42  7.06   Sodium 135 - 145 mmol/L 141  143  141   Potassium 3.5 - 5.1 mmol/L 3.5  4.1  3.8   Chloride 98 - 111 mmol/L 107  105  104   CO2 22 - 32 mmol/L 26  22  20    Calcium 8.9 - 10.3 mg/dL 9.2  9.4  8.9   Total Protein 6.5 - 8.1 g/dL 7.5   6.9   Total Bilirubin 0.0 - 1.2 mg/dL 0.6   0.3   Alkaline Phos 38 - 126 U/L 77   134   AST 15 - 41 U/L 18   20   ALT 0 - 44 U/L 17   15      RADIOGRAPHIC STUDIES: I have personally reviewed the radiological images as listed and agreed with the findings in the report. No results found.   ASSESSMENT & PLAN:   63 yo female with   1) Provoked LLE extensive DVT in 03/2020 - femoral and popliteal- provoked by craniotomy/microvascular decompression with infection complication and VP shunt  2) Pulmonary embolism - tiny right sided without rt heart strain. CTA chest. Likely occurred with DVT  but was picked on CTA chest later. Would not be considered separately unprovoked and not considered Eliquis failure. 3_ Portal vein thrombosis in the context of hiatal hernia surgery. PLAN:  -discussed that her rather extensive blood clots could be triggered by having plenty of inflammation from recent hiatal hernia surgery or could potentially be related to a blood clotting disorder -educated patient that liver issues would be a risk factor for portal vein thrombosis -will order testing to ensure that there  is no concern for a blood clotting disorder -discussed that there is a role for blood thinners for at least 6-9 months given the extensiveness of her blood clotting, and possibly for longer term if there are findings of a blood clotting disorder -recommend to continue eliquis at this time -discussed that it is most likely that she had Hepatitis type A as a child -discussed that since she is no longer having reflux issues, it could potentially be easier to manage taking a mutlivitamin or vitamin B complex -discussed that taking other vitamins in addition to vitamin B12 can also improve enegy levels -discussed that if she has adequate dairy intake, she may not need to take calcium supplements.  -will order blood tests to evaluate her iron and vitamin levels and ensure that there is no role for IV iron  -will order blood tests to ensure her blood counts are normal  -patient shall proceed with scheduled sleep apnea testing -recommend patient to connect with PCP to address her depression   . Orders Placed This Encounter  Procedures   CBC with Differential (Cancer Center Only)    Standing Status:   Future    Number of Occurrences:   1    Expected Date:   11/02/2023    Expiration Date:   11/01/2024   CMP (Cancer Center only)    Standing Status:   Future    Number of Occurrences:   1    Expected Date:   11/02/2023    Expiration Date:   11/01/2024   Antithrombin III    Standing Status:    Future    Number of Occurrences:   1    Expected Date:   11/02/2023    Expiration Date:   11/01/2024   Protein C activity    Standing Status:   Future    Number of Occurrences:   1    Expected Date:   11/02/2023    Expiration Date:   11/01/2024   Protein C, total    Standing Status:   Future    Number of Occurrences:   1    Expected Date:   11/02/2023    Expiration Date:   11/01/2024   Protein S activity    Standing Status:   Future    Number of Occurrences:   1    Expected Date:   11/02/2023    Expiration Date:   11/01/2024   Protein S, total    Standing Status:   Future    Number of Occurrences:   1    Expected Date:   11/02/2023    Expiration Date:   11/01/2024   Lupus anticoagulant panel    Standing Status:   Future    Number of Occurrences:   1    Expected Date:   11/02/2023    Expiration Date:   11/01/2024   Beta-2-glycoprotein i abs, IgG/M/A    Standing Status:   Future    Number of Occurrences:   1    Expected Date:   11/02/2023    Expiration Date:   11/01/2024   Homocysteine, serum    Standing Status:   Future    Number of Occurrences:   1    Expected Date:   11/02/2023    Expiration Date:   11/01/2024   Factor 5 leiden    Standing Status:   Future    Number of Occurrences:   1    Expected Date:   11/02/2023    Expiration  Date:   11/01/2024   Prothrombin gene mutation    Standing Status:   Future    Number of Occurrences:   1    Expected Date:   11/02/2023    Expiration Date:   11/01/2024   Cardiolipin antibodies, IgG, IgM, IgA    Standing Status:   Future    Number of Occurrences:   1    Expected Date:   11/02/2023    Expiration Date:   11/01/2024   Ferritin    Standing Status:   Future    Number of Occurrences:   1    Expected Date:   11/02/2023    Expiration Date:   11/01/2024   Iron and Iron Binding Capacity ([facility] only)    Standing Status:   Future    Number of Occurrences:   1    Expected Date:   11/02/2023    Expiration Date:   11/01/2024   Vitamin B12     Standing Status:   Future    Number of Occurrences:   1    Expected Date:   11/02/2023    Expiration Date:   11/01/2024    FOLLOW UP: Labs today Phone visit in 2 weeks with Dr Candise Che  The total time spent in the appointment was 45 minutes* .  All of the patient's questions were answered with apparent satisfaction. The patient knows to call the clinic with any problems, questions or concerns.   Wyvonnia Lora MD MS AAHIVMS [facility] Hematology/Oncology Physician [facility]  .*Total Encounter Time as defined by the Centers for Medicare and Medicaid Services includes, in addition to the face-to-face time of a patient visit (documented in the note above) non-face-to-face time: obtaining and reviewing outside history, ordering and reviewing medications, tests or procedures, care coordination (communications with other health care professionals or caregivers) and documentation in the medical record.    I,Mitra Faeizi,acting as a Neurosurgeon for Wyvonnia Lora, MD.,have documented all relevant documentation on the behalf of Wyvonnia Lora, MD,as directed by  Wyvonnia Lora, MD while in the presence of Wyvonnia Lora, MD.  .I have reviewed the above documentation for accuracy and completeness, and I agree with the above. Johney Maine MD

## 2023-11-03 LAB — BETA-2-GLYCOPROTEIN I ABS, IGG/M/A
Beta-2 Glyco I IgG: <9 GPI IgG units (ref 0–20)
Beta-2-Glycoprotein I IgA: <9 GPI IgA units (ref 0–25)
Beta-2-Glycoprotein I IgM: <9 GPI IgM units (ref 0–32)

## 2023-11-03 LAB — PROTEIN S ACTIVITY: Protein S Activity: 53 % — ABNORMAL LOW (ref 63–140)

## 2023-11-03 LAB — CARDIOLIPIN ANTIBODIES, IGG, IGM, IGA
Anticardiolipin IgA: <9 U/mL (ref 0–11)
Anticardiolipin IgG: 9 GPL U/mL (ref 0–14)
Anticardiolipin IgM: 31 [MPL'U]/mL — ABNORMAL HIGH (ref 0–12)

## 2023-11-03 LAB — PROTEIN C ACTIVITY: Protein C Activity: 111 % (ref 73–180)

## 2023-11-03 LAB — LUPUS ANTICOAGULANT PANEL
DRVVT: 43 s (ref 0.0–47.0)
PTT Lupus Anticoagulant: 41.2 s (ref 0.0–43.5)

## 2023-11-03 LAB — PROTEIN S, TOTAL: Protein S Ag, Total: 96 % (ref 60–150)

## 2023-11-03 LAB — HOMOCYSTEINE: Homocysteine: 8.1 umol/L (ref 0.0–17.2)

## 2023-11-03 MED ORDER — APIXABAN 5 MG PO TABS: 5.0000 mg | ORAL_TABLET | Freq: Two times a day (BID) | ORAL | 0 refills | Status: DC

## 2023-11-04 ENCOUNTER — Telehealth: Payer: Self-pay | Admitting: Hematology

## 2023-11-04 LAB — PROTEIN C, TOTAL: Protein C, Total: 120 % (ref 60–150)

## 2023-11-04 NOTE — Telephone Encounter (Signed)
 Left patient a vm regarding upcoming appointment

## 2023-11-07 LAB — FACTOR 5 LEIDEN

## 2023-11-08 LAB — PROTHROMBIN GENE MUTATION

## 2023-11-14 ENCOUNTER — Ambulatory Visit: Admitting: Family Medicine

## 2023-11-14 VITALS — BP 124/82 | HR 76 | Wt 168.0 lb

## 2023-11-14 DIAGNOSIS — I10 Essential (primary) hypertension: Secondary | ICD-10-CM | POA: Diagnosis not present

## 2023-11-14 DIAGNOSIS — R0602 Shortness of breath: Secondary | ICD-10-CM

## 2023-11-14 DIAGNOSIS — K219 Gastro-esophageal reflux disease without esophagitis: Secondary | ICD-10-CM

## 2023-11-14 DIAGNOSIS — J302 Other seasonal allergic rhinitis: Secondary | ICD-10-CM | POA: Diagnosis not present

## 2023-11-14 MED ORDER — CETIRIZINE HCL 10 MG PO TABS: 10.0000 mg | ORAL_TABLET | Freq: Every day | ORAL | 1 refills | Status: DC

## 2023-11-14 NOTE — Patient Instructions (Signed)
 It was great to see you again today.  Sent in zyrtec for allergies  Ok to try backing off protonix and see how you do  Go get chest xray  Come back tomorrow afternoon at 1:30 for lung function testing  Be well, Dr. [name]

## 2023-11-14 NOTE — Progress Notes (Unsigned)
  Date of Visit: 11/14/2023   SUBJECTIVE:   HPI:  Sherry Anderson presents today for routine follow up.  Hypertension - taking lisinopril 20mg  daily, tolerating well  GERD - taking protonix 40mg  daily. Wonders if she can back off this. Overall symptoms well controlled.  Allergies - has noticed recently that her face feels itchy, ears feel clogged, nose stuffy. Not on any antihistamines currently.  Shortness of breath - labs from last visit unremarkable. Has not yet gotten CXR. Notes shortness of breath occurs especially when she is nervous. Has never had spirometry in the past.    OBJECTIVE:   BP 124/82   Pulse 76   Wt 168 lb (76.2 kg)   LMP  (LMP Unknown) Comment: not sexually active  SpO2 98%   BMI 30.73 kg/m  Gen: no acute distress, pleasant cooperative HEENT: normocephalic, atraumatic, moist mucous membranes, oropharynx clear and moist, nares patent, tympanic membranes clear bilaterally, No anterior cervical or supraclavicular lymphadenopathy.  Heart: regular rate and rhythm, no murmur Lungs: clear to auscultation bilaterally, normal work of breathing  Neuro: alert, grossly nonfocal Ext: No appreciable lower extremity edema bilaterally   ASSESSMENT/PLAN:   Assessment & Plan HYPERTENSION, BENIGN ESSENTIAL Well controlled. Continue current medication regimen.  Gastroesophageal reflux disease, unspecified whether esophagitis present Advised reasonable to try backing off protonix and seeing how symptoms respond Shortness of breath Benign exam today Workup thus far revealed normal CBC, normal BNP, echo unremarkable in Oct, CT coronary calcium score 0. Recommend she gets CXR, also return for PFTs with Dr. Raymondo Band, scheduled. Seasonal allergies Rx zyrtec     Sherry J. [name], MD [facility]

## 2023-11-15 ENCOUNTER — Ambulatory Visit: Admitting: Pharmacist

## 2023-11-16 ENCOUNTER — Inpatient Hospital Stay: Attending: Hematology | Admitting: Hematology

## 2023-11-16 DIAGNOSIS — Z860101 Personal history of adenomatous and serrated colon polyps: Secondary | ICD-10-CM | POA: Insufficient documentation

## 2023-11-16 DIAGNOSIS — M549 Dorsalgia, unspecified: Secondary | ICD-10-CM | POA: Insufficient documentation

## 2023-11-16 DIAGNOSIS — R630 Anorexia: Secondary | ICD-10-CM | POA: Insufficient documentation

## 2023-11-16 DIAGNOSIS — G479 Sleep disorder, unspecified: Secondary | ICD-10-CM | POA: Insufficient documentation

## 2023-11-16 DIAGNOSIS — Z7901 Long term (current) use of anticoagulants: Secondary | ICD-10-CM | POA: Insufficient documentation

## 2023-11-16 DIAGNOSIS — K449 Diaphragmatic hernia without obstruction or gangrene: Secondary | ICD-10-CM | POA: Insufficient documentation

## 2023-11-16 DIAGNOSIS — R760 Raised antibody titer: Secondary | ICD-10-CM | POA: Diagnosis not present

## 2023-11-16 DIAGNOSIS — I81 Portal vein thrombosis: Secondary | ICD-10-CM | POA: Insufficient documentation

## 2023-11-16 DIAGNOSIS — R0602 Shortness of breath: Secondary | ICD-10-CM | POA: Insufficient documentation

## 2023-11-16 DIAGNOSIS — Z853 Personal history of malignant neoplasm of breast: Secondary | ICD-10-CM | POA: Insufficient documentation

## 2023-11-16 DIAGNOSIS — F32A Depression, unspecified: Secondary | ICD-10-CM | POA: Insufficient documentation

## 2023-11-16 DIAGNOSIS — E611 Iron deficiency: Secondary | ICD-10-CM | POA: Insufficient documentation

## 2023-11-16 DIAGNOSIS — R634 Abnormal weight loss: Secondary | ICD-10-CM | POA: Insufficient documentation

## 2023-11-16 DIAGNOSIS — Z86718 Personal history of other venous thrombosis and embolism: Secondary | ICD-10-CM | POA: Insufficient documentation

## 2023-11-16 DIAGNOSIS — R5383 Other fatigue: Secondary | ICD-10-CM | POA: Insufficient documentation

## 2023-11-16 DIAGNOSIS — Z800 Family history of malignant neoplasm of digestive organs: Secondary | ICD-10-CM | POA: Insufficient documentation

## 2023-11-16 NOTE — Assessment & Plan Note (Signed)
 Benign exam today Workup thus far revealed normal CBC, normal BNP, echo unremarkable in Oct, CT coronary calcium score 0. Recommend she gets CXR, also return for PFTs with Dr. Raymondo Band, scheduled.

## 2023-11-16 NOTE — Progress Notes (Signed)
 HEMATOLOGY/ONCOLOGY PHONE VISIT NOTE  Date of Service: 11/16/2023  Patient Care Team: Latrelle Dodrill, MD as PCP - General (Family Medicine) Marykay Lex, MD as PCP - Cardiology (Cardiology) Drema Dallas, DO as Consulting Physician (Neurology) Genia Del, MD as Consulting Physician (Obstetrics and Gynecology) Ricky Stabs, RN as VBCI Care Management  CHIEF COMPLAINTS/PURPOSE OF CONSULTATION:   Evaluation and management of portal vein thrombosis  HISTORY OF PRESENTING ILLNESS:   Sherry Anderson is a wonderful 63 y.o. female who has been referred to [place] by Dr. Levert Feinstein, MD for a discussion whether indefinite anticoagulation is warranted. The pt reports that she is doing well overall.  The pt reports that recently she has not been feeling well. She notes major fatigue and SOB. The pt claims that she is unsure as to why she feels this way, noting that fatigue has caused major sweats. The pt reports she had a surgery in June that helped with the trigeminal nerve pain. They noticed a clot in her LE in  August 2021 and was put on Eliquis. A few months later, the pt notes major SOB that came on unexpectedly after minor movement. They discovered a minor lung PE during this that they deemed unprovoked. The pt notes this SOB was new and she has not been as active recently. These symptoms have not resolved since. The pt is on a blood thinner currently and it was thought the Eliquis was not working, thus switching her to Lovenox. This pt also has a major Hiatal Hernia currently that is great in size. The pt notes that due to her symptoms she is not moving around much and is fairly immobile. The pt was also found to be iron deficient and was put on ferrous Sulfate 325 mg BID. She has stayed consistent with this and notes no issues tolerating.  The pt also noted that her PCP recommended for the pt to see a Psychiatrist for management of her depression medication and  management of her fatigue, sweats, and SOB. The pt has not talked to her Psychiatrist recently due to the stigma surrounding it and her beliefs regarding the situation.  The pt notes that she has two pregnancies and three kids, no miscarriages. The pt denies any use or birth control ever. She notes no issues regarding her pregnancies. She notes her mother has a history of being on Eliquis, but is unsure of the reason for this. She notes no familial blood disorders or autoimmune diseases.  On review of systems, pt reports depression, fatigue, SOB, sweats, neck/shoulder pain, floating sensation in head and denies constipation, diarrhea, lower back pain, abdominal pain, leg swelling, dizziness, lightheadedness, headaches, spinning feeling, sudden weight loss, decreased appetite, and any other symptoms.  INTERVAL HISTORY:  Sherry Anderson is a 62 y.o. female here for returns for evaluation and management of portal vein thrombosis.   She was last seen by me on 11/02/2023 and reported having a hiatal hernia repair in January, and reported having severe pain in the upper abdomen and back 2 weeks after her surgery which had resolved at time of clinical visit. Patient also reported dark stools attributed to oral iron, SOB sometimes, mild weight loss after her recent surgery, sleeping issues, dizziness when not taking naps sometimes, back pain, constant fatigue, depression, and lack of appetitie in the mornings.  I connected with Evern Bio on 11/16/23 at  3:30 PM EDT by telephone visit and verified that I am speaking with  the correct person using two identifiers.   I discussed the limitations, risks, security and privacy concerns of performing an evaluation and management service by telemedicine and the availability of in-person appointments. I also discussed with the patient that there may be a patient responsible charge related to this service. The patient expressed understanding and agreed to  proceed.   Other persons participating in the visit and their role in the encounter: none   Patient's location: home  Provider's location: [facility]   Chief Complaint: evaluation and management of portal vein thrombosis    Today, she reports having no new concerns since her last visit clinical visit a couple weeks ago.   Patient reports mildly improved p.o. intake. She denies any significant abdominal pain. Patient denies any bleeding issues with blood thinners.   She reports taking 5000 mcg B12 supplements daily.   The results of her recent lab work up was discussed with her in detail.   MEDICAL HISTORY:  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   Associated with high-grade dysplasia   Anemia    Anxiety and depression    Arthritis    Breast cancer (HCC) 2002   left breast cancer    Cerebral aneurysm 2021   had craniotomy MVD at [facility]   Depression    Diverticulosis of colon    DVT (deep venous thrombosis) (HCC) 2021   provoked by surgery   Essential hypertension, benign    Family history of pancreatic cancer    GERD (gastroesophageal reflux disease)    Heart murmur    systolic ejection murmur   had ECHO   Hepatitis    unsure of which type   High grade squamous intraepithelial cervical dysplasia 05/2017   Associated with adenocarcinoma in situ   History of adenomatous polyp of colon    tubular adenoma's  02-08-2017   History of gastritis    History of hiatal hernia    History of left breast cancer 2002---- per pt no recurrence   dx DCIS --- s/p  left breast lumpectomy;  per pt radiation therapy completed same year and completed antiestrogen therapy    History of radiation therapy    2002   left breast   History of trigeminal neuralgia    Hypothyroidism    Internal hemorrhoids    Pain    right ear and jaw   Personal history of radiation therapy    Pre-diabetes    Spinal stenosis    Spondylolisthesis    SVD  (spontaneous vaginal delivery)    x 1   Tremor of both hands    Tubular adenoma of colon     SURGICAL HISTORY: Past Surgical History:  Procedure Laterality Date   BRAIN SURGERY  01/25/2020   craniotomy MVD at [facility]   BREAST LUMPECTOMY Left 2002   ductal CA in situ   CERVICAL CONIZATION W/BX N/A 06/08/2017   Procedure: CONIZATION CERVIX WITH BIOPSY;  Surgeon: Dara Lords, MD;  Location: [facility];  Service: Gynecology;  Laterality: N/A;   CESAREAN SECTION  1990   twins   COLONOSCOPY  02-08-2017   dr Philbert Riser   CRANIECTOMY  02/12/2020   VP shunt following craniotomy   DILATATION & CURETTAGE/HYSTEROSCOPY WITH MYOSURE N/A 06/08/2017   Procedure: DILATATION & CURETTAGE/HYSTEROSCOPY WITH MYOSURE;  Surgeon: Dara Lords, MD;  Location: [facility];  Service: Gynecology;  Laterality: N/A;   ESOPHAGOGASTRODUODENOSCOPY  last one 11-24-2016  dr Philbert Riser   ROBOTIC ASSISTED TOTAL HYSTERECTOMY WITH BILATERAL SALPINGO OOPHERECTOMY N/A 09/21/2017   Procedure: XI ROBOTIC ASSISTED TOTAL HYSTERECTOMY WITH BILATERAL SALPINGO OOPHORECTOMY, Lysis of Adhesions;  Surgeon: Genia Del, MD;  Location: [facility];  Service: Gynecology;  Laterality: N/A;   TOOTH EXTRACTION     TRANSFORAMINAL LUMBAR INTERBODY FUSION (TLIF) WITH PEDICLE SCREW FIXATION 2 LEVEL N/A 04/13/2018   Procedure: RIGHT-SIDED LUMBAR 4-5 AND LUMBAR 5-SACRUM 1 TRANSFORAMINAL LUMBAR INTERBODY FUSION WITH INSTRUMENTATION AND ALLOGRAFT;  Surgeon: Estill Bamberg, MD;  Location: [facility];  Service: Orthopedics;  Laterality: N/A;   UPPER GI ENDOSCOPY N/A 08/30/2023   Procedure: UPPER ENDOSCOPY;  Surgeon: Gaynelle Adu, MD;  Location: [facility];  Service: General;  Laterality: N/A;   WISDOM TOOTH EXTRACTION     XI ROBOTIC ASSISTED HIATAL HERNIA REPAIR N/A 08/30/2023   Procedure: XI ROBOTIC ASSISTED HIATAL HERNIA REPAIR;  Surgeon: Gaynelle Adu, MD;  Location: [facility];  Service: General;  Laterality: N/A;     SOCIAL HISTORY: Social History   Socioeconomic History   Marital status: Legally Separated    Spouse name: Not on file   Number of children: 3   Years of education: 6   Highest education level: 6th grade  Occupational History   Occupation: Disability  Tobacco Use   Smoking status: Never    Passive exposure: Never   Smokeless tobacco: Never  Vaping Use   Vaping status: Never Used  Substance and Sexual Activity   Alcohol use: No   Drug use: No   Sexual activity: Not Currently    Partners: Male    Birth control/protection: Surgical    Comment: 1st intercourse 63 yo-Fewer than 5 partners, hysterectomy  Other Topics Concern   Not on file  Social History Narrative   Moved from [place] to [place], to [place] 1998   Lives at home with her nephew and her son.   Right-handed.   Occasional use of caffeine.   Social Drivers of Health   Financial Resource Strain: Medium Risk (10/17/2023)   Overall Financial Resource Strain (CARDIA)    Difficulty of Paying Living Expenses: Somewhat hard  Food Insecurity: No Food Insecurity (10/17/2023)   Hunger Vital Sign    Worried About Running Out of Food in the Last Year: Never true    Ran Out of Food in the Last Year: Never true  Transportation Needs: No Transportation Needs (10/17/2023)   PRAPARE - Administrator, Civil Service (Medical): No    Lack of Transportation (Non-Medical): No  Physical Activity: Inactive (10/17/2023)   Exercise Vital Sign    Days of Exercise per Week: 0 days    Minutes of Exercise per Session: 0 min  Stress: Stress Concern Present (10/17/2023)   [employer] of Occupational Health - Occupational Stress Questionnaire    Feeling of Stress : To some extent  Social Connections: Socially Isolated (10/17/2023)   Social Connection and Isolation Panel [NHANES]    Frequency of Communication with Friends and Family: Never    Frequency of Social Gatherings with Friends and Family: Never    Attends Religious  Services: Never    Database administrator or Organizations: Yes    Attends Banker Meetings: 1 to 4 times per year    Marital Status: Never married  Intimate Partner Violence: Not At Risk (10/17/2023)   Humiliation, Afraid, Rape, and Kick questionnaire    Fear of Current or Ex-Partner: No    Emotionally Abused: No  Physically Abused: No    Sexually Abused: No    FAMILY HISTORY: Family History  Problem Relation Age of Onset   Diabetes Mother    Hypertension Mother    Hyperlipidemia Father    Heart disease Father        Pacemaker   Dementia Father    Thyroid disease Sister    Schizophrenia Brother    Pancreatic cancer Cousin 10    ALLERGIES:  has no known allergies.  MEDICATIONS:  Current Outpatient Medications  Medication Sig Dispense Refill   apixaban (ELIQUIS) 5 MG TABS tablet Take 1 tablet (5 mg total) by mouth 2 (two) times daily. 180 tablet 0   Calcium Citrate-Vitamin D (CALCIUM + D PO) Take 1 tablet by mouth daily.     Carboxymethylcellulose Sodium (THERATEARS) 0.25 % SOLN Place 1 drop into both eyes daily as needed (Dry eyes).     cetirizine (ZYRTEC) 10 MG tablet Take 1 tablet (10 mg total) by mouth daily. 90 tablet 1   Cyanocobalamin (B-12 PO) Take 1,000 mcg by mouth daily.     ferrous sulfate 325 (65 FE) MG tablet Take 1 tablet (325 mg total) by mouth daily. 90 tablet 1   levothyroxine (SYNTHROID) 75 MCG tablet Take 1 tablet (75 mcg total) by mouth every morning. 30 minutes before food 90 tablet 3   lisinopril (ZESTRIL) 20 MG tablet Take 1 tablet (20 mg total) by mouth at bedtime. 90 tablet 3   pantoprazole (PROTONIX) 40 MG tablet TAKE 1 TABLET BY MOUTH EVERY DAY 90 tablet 1   QUEtiapine (SEROQUEL) 50 MG tablet Take 1 tablet (50 mg total) by mouth at bedtime. 90 tablet 2   venlafaxine XR (EFFEXOR-XR) 75 MG 24 hr capsule TAKE THREE CAPSULES BY MOUTH ONCE DAILY 270 capsule 2   No current facility-administered medications for this visit.    REVIEW OF  SYSTEMS:    10 Point review of Systems was done is negative except as noted above.   PHYSICAL EXAMINATION: TELEMEDICINE VISIT  LABORATORY DATA:  I have reviewed the data as listed  .    Latest Ref Rng & Units 11/02/2023   10:49 AM 10/21/2023   10:54 AM 09/26/2023   11:04 AM  CBC  WBC 4.0 - 10.5 K/uL 3.4  3.8  4.6   Hemoglobin 12.0 - 15.0 g/dL 16.1  09.6  04.5   Hematocrit 36.0 - 46.0 % 36.1  36.2  31.6   Platelets 150 - 400 K/uL 165  171  292     .    Latest Ref Rng & Units 11/02/2023   10:49 AM 10/21/2023   10:54 AM 09/26/2023   11:04 AM  CMP  Glucose 70 - 99 mg/dL 91  87  409   BUN 8 - 23 mg/dL 15  13  9    Creatinine 0.44 - 1.00 mg/dL 8.11  9.14  7.82   Sodium 135 - 145 mmol/L 141  143  141   Potassium 3.5 - 5.1 mmol/L 3.5  4.1  3.8   Chloride 98 - 111 mmol/L 107  105  104   CO2 22 - 32 mmol/L 26  22  20    Calcium 8.9 - 10.3 mg/dL 9.2  9.4  8.9   Total Protein 6.5 - 8.1 g/dL 7.5   6.9   Total Bilirubin 0.0 - 1.2 mg/dL 0.6   0.3   Alkaline Phos 38 - 126 U/L 77   134   AST 15 - 41 U/L 18  20   ALT 0 - 44 U/L 17   15      RADIOGRAPHIC STUDIES: I have personally reviewed the radiological images as listed and agreed with the findings in the report. No results found.   ASSESSMENT & PLAN:   63 yo female with   1) Provoked LLE extensive DVT in 03/2020 - femoral and popliteal- provoked by craniotomy/microvascular decompression with infection complication and VP shunt  2) Pulmonary embolism - tiny right sided without rt heart strain. CTA chest. Likely occurred with DVT but was picked on CTA chest later. Would not be considered separately unprovoked and not considered Eliquis failure. 3_ Portal vein thrombosis in the context of hiatal hernia surgery.  PLAN:  -discussed that she previously received lab work to help determine whether her Portal vein thrombosis was related to her hiatal hernia surgery or if there is concern for blood clotting disorder -Discussed lab results  from 2 weeks ago in detail with patient. CBC showed WBC of 3.4K, hemoglobin of 12.0, and platelets of 165K. -neutrophil count 1.6K not concerning -CBC shows that she has nearly completely recovered from her surgery hiatal hernia surgery with resolution of anemia. -CMP shows normal findings including normal liver function and electrolytes  -there is some mild iron deficiency -recommend consuming iron-rich foods -prothrombin gene and factor V leiden mutations negative -protein C, protein S, and antithrombin III levels did not show any inherited mutations -Protein S activity is borderline low at 53%, though this is not significantly concerning. Will repeat protein S testing in 3-4 months -discussed finding of elevated anticardiolipin IgM antibody level of 31 MPL U/mL.  -discussed that if there is concern for elevated anticardiolipin antibody present for at least 3 months, there this could be a risk factor for blood clots.  -will repeat anticardiolipin test at least 3 months apart to see if elevated findings remain. Discussed that if the findings remain, it would be an ongoing risk factor for blood clots and there may be a role for blood thinners long-term to prevent other blood clots.  -discussed that if her finding of elevated anticardiolipin antibodies resolves after 3 months, it would not be risk factor for blood clots and there may be a role for baby aspirin  -would recommend continuing eliquis at this time and repeating blood test in 3-4 months to see if her anticardiolipin antibody remains.  -vitamin B12 is elevated -discussed that this happen sometimes with liver elevation or with too much B12 replacement -discussed that elevated B12 can potentially cause decrease in potassium -hold B12 replacement for 2 weeks, then restart at one tablet twice a week -iron level is borderline low, which is likely from blood loss with surgery. -okay to focus on optimizing iron in the diet or taking Ferrous  sulphate or iron polysaccharide orally twice a week if she chooses to. Discussed that if oral iron causes constipation, she can hold oral iron and optimize consumption of iron-rich foods.  -answered all of patient's questions in detail  FOLLOW UP: Labs in 12 weeks Phone visit with Dr Candise Che in 14 weeks  The total time spent in the appointment was 25 minutes* .  All of the patient's questions were answered with apparent satisfaction. The patient knows to call the clinic with any problems, questions or concerns.   Wyvonnia Lora MD MS AAHIVMS [facility] Hematology/Oncology Physician [facility]  .*Total Encounter Time as defined by the Centers for Medicare and Medicaid Services includes, in addition to the face-to-face time  of a patient visit (documented in the note above) non-face-to-face time: obtaining and reviewing outside history, ordering and reviewing medications, tests or procedures, care coordination (communications with other health care professionals or caregivers) and documentation in the medical record.    I,Mitra Faeizi,acting as a Neurosurgeon for Wyvonnia Lora, MD.,have documented all relevant documentation on the behalf of Wyvonnia Lora, MD,as directed by  Wyvonnia Lora, MD while in the presence of Wyvonnia Lora, MD.   .I have reviewed the above documentation for accuracy and completeness, and I agree with the above. Johney Maine MD

## 2023-11-16 NOTE — Assessment & Plan Note (Signed)
 Well controlled. Continue current medication regimen.

## 2023-11-16 NOTE — Assessment & Plan Note (Signed)
 Advised reasonable to try backing off protonix and seeing how symptoms respond

## 2023-11-18 ENCOUNTER — Telehealth: Payer: Self-pay | Admitting: Hematology

## 2023-11-18 NOTE — Telephone Encounter (Signed)
Called patient number not in service.

## 2023-12-01 ENCOUNTER — Telehealth: Payer: Self-pay | Admitting: Pharmacist

## 2023-12-01 ENCOUNTER — Other Ambulatory Visit: Payer: Self-pay | Admitting: Family Medicine

## 2023-12-01 NOTE — Telephone Encounter (Signed)
 Attempted to contact patient for follow-up of missed appointment for PFT.   Left HIPAA compliant voice mail requesting call back to direct phone: [phone]  Total time with patient call and documentation of interaction: 3 minutes.

## 2023-12-07 ENCOUNTER — Other Ambulatory Visit: Payer: Self-pay | Admitting: Family Medicine

## 2023-12-07 DIAGNOSIS — Z1231 Encounter for screening mammogram for malignant neoplasm of breast: Secondary | ICD-10-CM

## 2023-12-27 ENCOUNTER — Ambulatory Visit
Admission: RE | Admit: 2023-12-27 | Discharge: 2023-12-27 | Disposition: A | Discharge status: Home / Self Care | Source: Ambulatory Visit | Attending: Family Medicine | Admitting: Family Medicine

## 2023-12-27 ENCOUNTER — Other Ambulatory Visit: Payer: Self-pay | Admitting: Family Medicine

## 2023-12-27 DIAGNOSIS — Z1231 Encounter for screening mammogram for malignant neoplasm of breast: Secondary | ICD-10-CM

## 2023-12-27 DIAGNOSIS — N63 Unspecified lump in unspecified breast: Secondary | ICD-10-CM

## 2023-12-28 ENCOUNTER — Ambulatory Visit (HOSPITAL_BASED_OUTPATIENT_CLINIC_OR_DEPARTMENT_OTHER): Attending: Family Medicine | Admitting: Internal Medicine

## 2023-12-28 DIAGNOSIS — G4733 Obstructive sleep apnea (adult) (pediatric): Secondary | ICD-10-CM | POA: Insufficient documentation

## 2023-12-28 DIAGNOSIS — R400 Somnolence: Secondary | ICD-10-CM | POA: Diagnosis not present

## 2023-12-28 DIAGNOSIS — R0602 Shortness of breath: Secondary | ICD-10-CM | POA: Insufficient documentation

## 2023-12-31 DIAGNOSIS — R0602 Shortness of breath: Secondary | ICD-10-CM

## 2023-12-31 NOTE — Procedures (Signed)
 Maryan Smalling [facility] [address] [place], [place] [address] Tel: [phone]   Fax: [phone]  Polysomnography Interpretation  Patient Name:  Sherry Anderson, Sherry Anderson Date:  12/28/2023 Referring Physician:  Daleen Dubs MD  Indications for Polysomnography The patient is a 63 year-old Female who is 5\' 2"  and weighs 168.0 lbs. Her BMI equals 30.9.  A full night polysomnogram was performed to evaluate for -OSA.  Medication   Took meds at 2300  seroquel   eliquis    Polysomnogram Data A full night polysomnogram recorded the standard physiologic parameters including EEG, EOG, EMG, EKG, nasal and oral airflow.  Respiratory parameters of chest and abdominal movements were recorded with Respiratory Inductance Plethysmography belts.  Oxygen saturation was recorded by pulse oximetry.   Sleep Architecture The total recording time of the polysomnogram was 391.1 minutes.  The total sleep time was 254.5 minutes.  The patient spent 0.6% of total sleep time in Stage N1, 58.5% in Stage N2, 16.3% in Stages N3, and 24.6% in REM.  Sleep latency was 17.9 minutes.  REM latency was 82.0 minutes.  Sleep Efficiency was 65.1%.  Wake after Sleep Onset time was 118.0 minutes.  Respiratory Events The polysomnogram revealed a presence of 10 obstructive, 1 central, and - mixed apneas resulting in an Apnea index of 13.7 events per hour.  There were 62 hypopneas (>=3% desaturation and/or arousal) resulting in an Apnea\Hypopnea Index (AHI >=3% desaturation and/or arousal) of 28.3 events per hour.  There were 55 hypopneas (>=4% desaturation) resulting in an Apnea\Hypopnea Index (AHI >=4% desaturation) of 26.6 events per hour.  There were - Respiratory Effort Related Arousals resulting in a RERA index of - events per hour. The Respiratory Disturbance Index is 28.3 events per hour.  The snore index was - events per hour.  Mean oxygen saturation was 92.4%.  The lowest oxygen saturation  during sleep was 82.0%.  Time spent <=88% oxygen saturation was 22.1 minutes (5.7%).  End Tidal CO2 during sleep ranged from - to - mmHg. End Tidal CO2 was greater than 50 mmHg for - minutes and greater than 55 mmHg for - minutes.  Limb Activity There were 52 total limb movements recorded, of this total, 46 were classified as PLMs.  PLM index was 10.8 per hour and PLM associated with Arousals index was 0.2 per hour.  Cardiac Summary The average pulse rate was 64.1 bpm.  The minimum pulse rate was 55.0 bpm while the maximum pulse rate was 102.0 bpm.  Cardiac rhythm was normal.  Comment: Moderate obstructive sleep apnea, AHI (4%) 26.6/hr. Snoring with oxygen desaturation to a nadir of 82% and mean 92.4%. She had significant difficulty initiating and maintaining sleep until 2:30 AM. Insufficient early events and sleep to meet protocol requirement for split CPAP titration. Also note limited English fluency- required assistance with forms. Mild periodic limb movement in sleep.  Diagnosis: Obstructive sleep apnea  Recommendations: Suggest trial of CPAP autopap 5-15. Otherwise she would benefit from return for dedicated CPAP titration study.   This study was personally reviewed and electronically signed by: [employer]  MD Accredited Board Certified in Sleep Medicine Date/Time: 12/31/23  12:10        Diagnostic PSG Report  Patient Name: Sherry Anderson, Sherry Anderson Date: 12/28/2023  Date of Birth: 1960-08-23 Study Type: Split Night  Age: 51 year MRN #: [id number]  Sex: Female Interpreting Physician: [employer] [id number]  Height: 5\' 2"  Referring Physician: Daleen Dubs MD  Weight: 168.0 lbs Recording Tech: Oleh Berliner  RPSGT RST  BMI: 30.9 Scoring Tech: Yvonne McConnico RPSGT RST  ESS: 14 Neck Size: 16.5   Study Overview  Lights Off: 11:05:10 PM  Count Index  Lights On: 05:36:16 AM Awakenings: 21 5.0  Time in Bed: 391.1 min. Arousals: 4 0.9  Total Sleep Time: 254.5  min. AHI (>=3% Desat and/or Ar.): 120 28.3   Sleep Efficiency: 65.1% AHI (>=4% Desat): 113 26.6   Sleep Latency: 17.9 min. Limb Movements: 52 12.3  Wake After Sleep Onset: 118.0 min. Snore: - -  REM Latency from Sleep Onset: 82.0 min. Desaturations: 202 47.6     Minimum SpO2 TST: 82.0%    Sleep Architecture  % of Time in Bed Stages Time (mins) % Sleep Time  Wake 136.0   Stage N1 1.5 0.6%  Stage N2 149.0 58.5%  Stage N3 41.5 16.3%  REM 62.5 24.6%   Arousal Summary   NREM REM Sleep Index  Respiratory Arousals 2 - 2 0.5  PLM Arousals 1 - 1 0.2  Isolated Limb Movement Arousals - - - -  Snore Arousals - - - -  Spontaneous Arousals 1 - 1 0.2  Total 4 - 4 0.9   Limb Movement Summary   Count Index  Isolated Limb Movements 6 1.4  Periodic Limb Movements (PLMs) 46 10.8  Total Limb Movements 52 12.3    Respiratory Summary   By Sleep Stage By Body Position Total   NREM REM Supine Non-Supine   Time (min) 192.0 62.5 - 254.5 254.5         Obstructive Apnea 10 - - 10 10  Mixed Apnea - - - - -  Central Apnea 1 - - 1 1  Total Apneas 50 8 - 58 58  Total Apnea Index 15.6 7.7 - 13.7 13.7         Hypopneas (>=3% Desat and/or Ar.) 55 7 - 62 62  AHI (>=3% Desat and/or Ar.) 32.8 14.4 - 28.3 28.3         Hypopneas (>=4% Desat) 48 7 - 55 55  AHI (>=4% Desat) 30.6 14.4 - 26.6 26.6          RERAs - - - - -  RERA Index - - - - -         RDI 32.8 14.4 - 28.3 28.3    Respiratory Event Type Index  Central Apneas 0.2  Obstructive Apneas 2.4  Mixed Apneas -  Central Hypopneas -  Obstructive Hypopneas 0.2  Central Apnea + Hypopnea (CAHI) 0.2  Obstructive Apnea + Hypopnea (OAHI) 29.5   Respiratory Event Durations   Apnea Hypopnea   NREM REM NREM REM  Average (seconds) 17.5 17.8 16.7 17.9  Maximum (seconds) 28.7 24.4 24.8 24.4    Oxygen Saturation Summary   Wake NREM REM TST TIB  Average SpO2 (%) 93.5% 91.9% 91.3% 91.8% 92.4%  Minimum SpO2 (%) 86.0% 83.0% 82.0% 82.0% 82.0%   Maximum SpO2 (%) 98.0% 97.0% 98.0% 98.0% 98.0%   Oxygen Saturation Distribution  Range (%) Time in range (min) Time in range (%)  90.0 - 100.0 322.1 82.3%  80.0 - 90.0 69.3 17.7%  70.0 - 80.0 - -  60.0 - 70.0 - -  50.0 - 60.0 - -  0.0 - 50.0 - -  Time Spent <=88% SpO2  Range (%) Time in range (min) Time in range (%)  0.0 - 88.0 22.1 5.7%      Count Index  Desaturations 202 47.6    Cardiac Summary  Wake NREM REM Sleep Total  Average Pulse Rate (BPM) 65.6 63.3 63.6 63.4 64.1  Minimum Pulse Rate (BPM) 57.0 55.0 55.0 55.0 55.0  Maximum Pulse Rate (BPM) 87.0 78.0 102.0 102.0 102.0   Pulse Rate Distribution:  Range (bpm) Time in range (min) Time in range (%)  0.0 - 40.0 - -  40.0 - 60.0 58.2 14.9%  60.0 - 80.0 331.6 84.7%  80.0 - 100.0 1.6 0.4%  100.0 - 120.0 0.0 0.0%  120.0 - 140.0 - -  140.0 - 200.0 - -   EtCO2 Summary  Stage Min (mmHg) Average (mmHg) Max (mmHg)  Wake - - -  NREM(1+2+3) - - -  REM - - -   EtCO2 Distribution:  Range (mmHg) Time in range (min) Time in range (%)  20.0 - 40.0 - -  40.0 - 50.0 - -  50.0 - 100.0 - -  55.0 - 100.0 - -  Excluded data <20.0 & >65.0 391.5 100.0%     Hypnograms                         Technologist Comments  Pt arrived late and with wet hair so I started with my other pt.  She also did not have paperwork so i left that without however, i did not realize that she really couldnt write or read English that well so when I went to hook her up , we still had to do all the paper work and then hook her up. She did not sleep even with her sleeping pill.  It took a long time for her to go to sleep.  At  2 am she had only had about 69 min of sleep and 20 events.  She did not meet split night criteria but as the night went on she got into a deep sleep and had many events . I observe hypopneas, apneas and one central. She also had PLMS with or without arousals. I did not observe any cardiac irregularity.  She  will need to return for a CPAP titration.                           [employer] Diplomate, Biomedical engineer of Sleep Medicine  ELECTRONICALLY SIGNED ON:  12/31/2023, 12:00 PM [facility] PH: [phone]   FX: [phone] ACCREDITED BY THE AMERICAN ACADEMY OF SLEEP MEDICINE

## 2024-01-06 ENCOUNTER — Encounter: Payer: Self-pay | Admitting: Family Medicine

## 2024-01-16 ENCOUNTER — Encounter (HOSPITAL_COMMUNITY): Payer: Self-pay

## 2024-01-16 ENCOUNTER — Ambulatory Visit (HOSPITAL_COMMUNITY): Admission: EM | Admit: 2024-01-16 | Discharge: 2024-01-16 | Disposition: A | Discharge status: Home / Self Care

## 2024-01-16 DIAGNOSIS — J039 Acute tonsillitis, unspecified: Secondary | ICD-10-CM | POA: Diagnosis not present

## 2024-01-16 LAB — POC SARS CORONAVIRUS 2 AG -  ED: SARS Coronavirus 2 Ag: NEGATIVE

## 2024-01-16 LAB — POCT RAPID STREP A (OFFICE): Rapid Strep A Screen: NEGATIVE

## 2024-01-16 MED ORDER — IBUPROFEN 200 MG PO TABS
ORAL_TABLET | ORAL | Status: AC
Start: 2024-01-16 — End: ?
  Filled 2024-01-16: qty 3

## 2024-01-16 MED ORDER — IBUPROFEN 200 MG PO TABS
600.0000 mg | ORAL_TABLET | Freq: Once | ORAL | Status: AC
  Administered 2024-01-16: 600 mg via ORAL

## 2024-01-16 MED ORDER — IBUPROFEN 600 MG PO TABS: 600.0000 mg | ORAL_TABLET | Freq: Four times a day (QID) | ORAL | 0 refills | Status: DC | PRN

## 2024-01-16 NOTE — ED Provider Notes (Signed)
 [facility]  Note:  This document was prepared using Dragon voice recognition software and may include unintentional dictation errors.  MRN: [id number] DOB: [birth year]  Subjective:   Sherry Anderson is a 63 y.o. female presenting for acute left-sided sore throat that started this morning approximately 3 AM.  Patient denies any known sick contacts.  Has not taken any over-the-counter medication to treat symptoms.  Patient denies any cough, fever, body aches, fatigue, nasal congestion.  Patient became concerned this morning when she was brushing her teeth and she noticed a erythematous flap of skin hanging from her left tonsil.  Patient denies any significant swelling, difficulty swallowing, stridor, wheezing, difficulty breathing.  No current facility-administered medications for this encounter.  Current Outpatient Medications:    ibuprofen  (ADVIL ) 600 MG tablet, Take 1 tablet (600 mg total) by mouth every 6 (six) hours as needed., Disp: 30 tablet, Rfl: 0   Calcium  Citrate-Vitamin D  (CALCIUM  + D PO), Take 1 tablet by mouth daily., Disp: , Rfl:    Carboxymethylcellulose Sodium (THERATEARS) 0.25 % SOLN, Place 1 drop into both eyes daily as needed (Dry eyes)., Disp: , Rfl:    cetirizine  (ZYRTEC ) 10 MG tablet, Take 1 tablet (10 mg total) by mouth daily., Disp: 90 tablet, Rfl: 1   Cyanocobalamin  (B-12 PO), Take 1,000 mcg by mouth daily., Disp: , Rfl:    ELIQUIS  5 MG TABS tablet, TAKE 1 TABLET BY MOUTH TWICE A DAY, Disp: 180 tablet, Rfl: 0   ferrous sulfate  325 (65 FE) MG tablet, Take 1 tablet (325 mg total) by mouth daily., Disp: 90 tablet, Rfl: 1   levothyroxine  (SYNTHROID ) 75 MCG tablet, Take 1 tablet (75 mcg total) by mouth every morning. 30 minutes before food, Disp: 90 tablet, Rfl: 3   lisinopril  (ZESTRIL ) 20 MG tablet, Take 1 tablet (20 mg total) by mouth at bedtime., Disp: 90 tablet, Rfl: 3   pantoprazole  (PROTONIX ) 40 MG tablet, TAKE 1 TABLET BY MOUTH EVERY DAY, Disp: 90  tablet, Rfl: 1   QUEtiapine  (SEROQUEL ) 50 MG tablet, Take 1 tablet (50 mg total) by mouth at bedtime., Disp: 90 tablet, Rfl: 2   venlafaxine  XR (EFFEXOR -XR) 75 MG 24 hr capsule, TAKE THREE CAPSULES BY MOUTH ONCE DAILY, Disp: 270 capsule, Rfl: 2   No Known Allergies  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   Associated with high-grade dysplasia   Anemia    Anxiety and depression    Arthritis    Breast cancer (HCC) 2002   left breast cancer    Cerebral aneurysm 2021   had craniotomy MVD [facility]   Depression    Diverticulosis of colon    DVT (deep venous thrombosis) (HCC) 2021   provoked by surgery   Essential hypertension, benign    Family history of pancreatic cancer    GERD (gastroesophageal reflux disease)    Heart murmur    systolic ejection murmur   had ECHO   Hepatitis    unsure of which type   High grade squamous intraepithelial cervical dysplasia 05/2017   Associated with adenocarcinoma in situ   History of adenomatous polyp of colon    tubular adenoma's  02-08-2017   History of gastritis    History of hiatal hernia    History of left breast cancer 2002---- per pt no recurrence   dx DCIS --- s/p  left breast lumpectomy;  per pt radiation therapy completed same year and completed antiestrogen therapy  History of radiation therapy    2002   left breast   History of trigeminal neuralgia    Hypothyroidism    Internal hemorrhoids    Pain    right ear and jaw   Personal history of radiation therapy    Pre-diabetes    Spinal stenosis    Spondylolisthesis    SVD (spontaneous vaginal delivery)    x 1   Tremor of both hands    Tubular adenoma of colon      Past Surgical History:  Procedure Laterality Date   BRAIN SURGERY  01/25/2020   craniotomy MVD at [facility]   BREAST LUMPECTOMY Left 2002   ductal CA in situ   CERVICAL CONIZATION W/BX N/A 06/08/2017   Procedure: CONIZATION CERVIX WITH BIOPSY;   Surgeon: Lacretia Piccolo, MD;  Location: [facility];  Service: Gynecology;  Laterality: N/A;   CESAREAN SECTION  1990   twins   COLONOSCOPY  02-08-2017   dr Noreene Bearded   CRANIECTOMY  02/12/2020   VP shunt following craniotomy   DILATATION & CURETTAGE/HYSTEROSCOPY WITH MYOSURE N/A 06/08/2017   Procedure: DILATATION & CURETTAGE/HYSTEROSCOPY WITH MYOSURE;  Surgeon: Lacretia Piccolo, MD;  Location: [facility];  Service: Gynecology;  Laterality: N/A;   ESOPHAGOGASTRODUODENOSCOPY  last one 11-24-2016   dr Noreene Bearded   ROBOTIC ASSISTED TOTAL HYSTERECTOMY WITH BILATERAL SALPINGO OOPHERECTOMY N/A 09/21/2017   Procedure: XI ROBOTIC ASSISTED TOTAL HYSTERECTOMY WITH BILATERAL SALPINGO OOPHORECTOMY, Lysis of Adhesions;  Surgeon: Lavoie, Marie-Lyne, MD;  Location: [facility];  Service: Gynecology;  Laterality: N/A;   TOOTH EXTRACTION     TRANSFORAMINAL LUMBAR INTERBODY FUSION (TLIF) WITH PEDICLE SCREW FIXATION 2 LEVEL N/A 04/13/2018   Procedure: RIGHT-SIDED LUMBAR 4-5 AND LUMBAR 5-SACRUM 1 TRANSFORAMINAL LUMBAR INTERBODY FUSION WITH INSTRUMENTATION AND ALLOGRAFT;  Surgeon: Virl Grimes, MD;  Location: [facility];  Service: Orthopedics;  Laterality: N/A;   UPPER GI ENDOSCOPY N/A 08/30/2023   Procedure: UPPER ENDOSCOPY;  Surgeon: [name], MD;  Location: [facility];  Service: General;  Laterality: N/A;   WISDOM TOOTH EXTRACTION     XI ROBOTIC ASSISTED HIATAL HERNIA REPAIR N/A 08/30/2023   Procedure: XI ROBOTIC ASSISTED HIATAL HERNIA REPAIR;  Surgeon: [name], MD;  Location: [facility];  Service: General;  Laterality: N/A;    Family History  Problem Relation Age of Onset   Diabetes Mother    Hypertension Mother    Hyperlipidemia Father    Heart disease Father        Pacemaker   Dementia Father    Thyroid  disease Sister    Schizophrenia Brother    Pancreatic cancer Cousin 71    Social History   Tobacco Use   Smoking status: Never    Passive exposure: Never   Smokeless  tobacco: Never  Vaping Use   Vaping status: Never Used  Substance Use Topics   Alcohol  use: No   Drug use: No    ROS Refer to HPI for ROS details.  Objective:   Vitals: BP (!) 179/106 (BP Location: Right Arm)   Pulse 66   Temp 98.1 F (36.7 C) (Oral)   Resp 16   LMP  (LMP Unknown) Comment: not sexually active  SpO2 96%   Physical Exam Vitals and nursing note reviewed.  Constitutional:      General: She is not in acute distress.    Appearance: She is well-developed. She is not ill-appearing or toxic-appearing.  HENT:     Head: Normocephalic and atraumatic.  Nose: Nose normal. No congestion or rhinorrhea.     Mouth/Throat:     Lips: Pink.     Mouth: Mucous membranes are moist.     Pharynx: Oropharynx is clear. Posterior oropharyngeal erythema and postnasal drip present. No oropharyngeal exudate.   Eyes:     General:        Right eye: No discharge.        Left eye: No discharge.     Extraocular Movements: Extraocular movements intact.     Conjunctiva/sclera: Conjunctivae normal.  Cardiovascular:     Rate and Rhythm: Normal rate.  Pulmonary:     Effort: Pulmonary effort is normal. No respiratory distress.  Musculoskeletal:        General: Normal range of motion.     Cervical back: Normal range of motion and neck supple. No rigidity or tenderness.  Skin:    General: Skin is warm and dry.  Neurological:     General: No focal deficit present.     Mental Status: She is alert and oriented to person, place, and time.  Psychiatric:        Mood and Affect: Mood normal.        Behavior: Behavior normal.     Procedures  Results for orders placed or performed during the hospital encounter of 01/16/24 (from the past 24 hours)  POC rapid strep A     Status: Normal   Collection Time: 01/16/24  2:17 PM  Result Value Ref Range   Rapid Strep A Screen Negative   POC SARS Coronavirus 2 Ag-ED - Nasal Swab     Status: None   Collection Time: 01/16/24  2:26 PM  Result  Value Ref Range   SARS Coronavirus 2 Ag Negative Negative   Negative      No results found.   Assessment and Plan :     Discharge Instructions       1. Tonsillitis (Primary) - POC rapid strep A performed in UC is negative for strep pharyngitis - POC SARS Coronavirus completed in UC is negative for COVID - ibuprofen  (ADVIL ) tablet 600 mg given in UC for acute throat pain and inflammation - ibuprofen  (ADVIL ) 600 MG tablet; Take 1 tablet (600 mg total) by mouth every 6 (six) hours as needed.  Dispense: 30 tablet; Refill: 0 - Ambulatory referral to ENT for follow-up evaluation if symptoms do not improve over the next 2 to 3 days or if symptoms worsen. -Continue to monitor symptoms for any change in severity if there is any escalation of current symptoms or development of new symptoms follow-up in ER for further evaluation and management.    Ademide Schaberg B Arley Salamone   Pavel Gadd, [place] B, [place] 01/16/24 1439

## 2024-01-16 NOTE — ED Triage Notes (Signed)
 Patient here today with c/o ST that started this morning at around 3. Denies other symptoms.

## 2024-01-16 NOTE — Discharge Instructions (Addendum)
  1. Tonsillitis (Primary) - POC rapid strep A performed in UC is negative for strep pharyngitis - POC SARS Coronavirus completed in UC is negative for COVID - ibuprofen  (ADVIL ) tablet 600 mg given in UC for acute throat pain and inflammation - ibuprofen  (ADVIL ) 600 MG tablet; Take 1 tablet (600 mg total) by mouth every 6 (six) hours as needed.  Dispense: 30 tablet; Refill: 0 - Ambulatory referral to ENT for follow-up evaluation if symptoms do not improve over the next 2 to 3 days or if symptoms worsen. -Continue to monitor symptoms for any change in severity if there is any escalation of current symptoms or development of new symptoms follow-up in ER for further evaluation and management.

## 2024-01-17 ENCOUNTER — Ambulatory Visit (INDEPENDENT_AMBULATORY_CARE_PROVIDER_SITE_OTHER): Admitting: Otolaryngology

## 2024-01-17 ENCOUNTER — Encounter (INDEPENDENT_AMBULATORY_CARE_PROVIDER_SITE_OTHER): Payer: Self-pay | Admitting: Otolaryngology

## 2024-01-17 VITALS — BP 160/96 | HR 100 | Ht 63.0 in | Wt 174.0 lb

## 2024-01-17 DIAGNOSIS — J039 Acute tonsillitis, unspecified: Secondary | ICD-10-CM | POA: Diagnosis not present

## 2024-01-17 DIAGNOSIS — J029 Acute pharyngitis, unspecified: Secondary | ICD-10-CM

## 2024-01-17 MED ORDER — AMOXICILLIN-POT CLAVULANATE 875-125 MG PO TABS: 1.0000 | ORAL_TABLET | Freq: Two times a day (BID) | ORAL | 0 refills | Status: AC

## 2024-01-17 NOTE — Progress Notes (Signed)
 Dear Dr. Dawn Eth, Here is my assessment for our mutual patient, Sherry Anderson. Thank you for allowing me the opportunity to care for your patient. Please do not hesitate to contact me should you have any other questions. Sincerely, Dr. Milon Aloe  Otolaryngology Clinic Note Referring provider: Dr. Dawn Eth HPI:  Sherry Anderson is a 64 y.o. female kindly referred by Dr. Dawn Eth for evaluation of tonsillitis  Initial visit (01/17/2024): Patient reports: woke up yesterday with a sore throat worse on left than right. She was then bruising her teeth and felt something in the back of the throat and pulled it, reports it "looked like meat." Some odynophagia since. Went to UC, who diagnosed her with tonsillitis and referred here. Feeling better with throat but having some ear discomfort and headache and some generalized systemic symptoms such as fatigue and malaise. No objective fevers, sick contacts, nasal congestion.  Patient otherwise denies: - dysphagia, aspiration episodes or PNA - changes in voice, new SOB, hemoptysis - neck masses  H&N Surgery: no Personal or FHx of bleeding dz or anesthesia difficulty: no  Tobacco: no  PMHx: Hiatal Hernia, Iron  Deficiency Anemia, Portal Vein Thrombosis, LLE DVT, OSA, HTN, GERD  Independent Review of Additional Tests or Records:  UC Notes (01/16/2024) Johnathan Reddick: left sore throat, no sick contacts; no OTC meds; no URI sx; no trouble swallowing or breathing; Dx: Tonsillitis; Rx: ref to ENT  COVID and Rapid Strep 01/16/2024: negative CBC and CMP 11/02/2023: WBC 3.4, Hgb 12.0, Plt 165, ANC 1.6; BUN/Cr 15/0.69 Dr. Dawn Eth (11/14/2023): chronic SOB, ref to pulm? PMH/Meds/All/SocHx/FamHx/ROS:   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   Associated with high-grade dysplasia   Anemia    Anxiety and depression    Arthritis    Breast cancer (HCC) 2002   left breast cancer    Cerebral  aneurysm 2021   had craniotomy MVD at [facility]   Depression    Diverticulosis of colon    DVT (deep venous thrombosis) (HCC) 2021   provoked by surgery   Essential hypertension, benign    Family history of pancreatic cancer    GERD (gastroesophageal reflux disease)    Heart murmur    systolic ejection murmur   had ECHO   Hepatitis    unsure of which type   High grade squamous intraepithelial cervical dysplasia 05/2017   Associated with adenocarcinoma in situ   History of adenomatous polyp of colon    tubular adenoma's  02-08-2017   History of gastritis    History of hiatal hernia    History of left breast cancer 2002---- per pt no recurrence   dx DCIS --- s/p  left breast lumpectomy;  per pt radiation therapy completed same year and completed antiestrogen therapy    History of radiation therapy    2002   left breast   History of trigeminal neuralgia    Hypothyroidism    Internal hemorrhoids    Pain    right ear and jaw   Personal history of radiation therapy    Pre-diabetes    Spinal stenosis    Spondylolisthesis    SVD (spontaneous vaginal delivery)    x 1   Tremor of both hands    Tubular adenoma of colon      Past Surgical History:  Procedure Laterality Date   BRAIN SURGERY  01/25/2020   craniotomy MVD at [facility]   BREAST LUMPECTOMY Left 2002   ductal CA in  situ   CERVICAL CONIZATION W/BX N/A 06/08/2017   Procedure: CONIZATION CERVIX WITH BIOPSY;  Surgeon: Lacretia Piccolo, MD;  Location: [facility];  Service: Gynecology;  Laterality: N/A;   CESAREAN SECTION  1990   twins   COLONOSCOPY  02-08-2017   dr Noreene Bearded   CRANIECTOMY  02/12/2020   VP shunt following craniotomy   DILATATION & CURETTAGE/HYSTEROSCOPY WITH MYOSURE N/A 06/08/2017   Procedure: DILATATION & CURETTAGE/HYSTEROSCOPY WITH MYOSURE;  Surgeon: Lacretia Piccolo, MD;  Location: [facility];  Service: Gynecology;  Laterality: N/A;   ESOPHAGOGASTRODUODENOSCOPY  last one  11-24-2016   dr Noreene Bearded   ROBOTIC ASSISTED TOTAL HYSTERECTOMY WITH BILATERAL SALPINGO OOPHERECTOMY N/A 09/21/2017   Procedure: XI ROBOTIC ASSISTED TOTAL HYSTERECTOMY WITH BILATERAL SALPINGO OOPHORECTOMY, Lysis of Adhesions;  Surgeon: Lavoie, Marie-Lyne, MD;  Location: [facility];  Service: Gynecology;  Laterality: N/A;   TOOTH EXTRACTION     TRANSFORAMINAL LUMBAR INTERBODY FUSION (TLIF) WITH PEDICLE SCREW FIXATION 2 LEVEL N/A 04/13/2018   Procedure: RIGHT-SIDED LUMBAR 4-5 AND LUMBAR 5-SACRUM 1 TRANSFORAMINAL LUMBAR INTERBODY FUSION WITH INSTRUMENTATION AND ALLOGRAFT;  Surgeon: Virl Grimes, MD;  Location: [facility];  Service: Orthopedics;  Laterality: N/A;   UPPER GI ENDOSCOPY N/A 08/30/2023   Procedure: UPPER ENDOSCOPY;  Surgeon: [name], MD;  Location: [facility];  Service: General;  Laterality: N/A;   WISDOM TOOTH EXTRACTION     XI ROBOTIC ASSISTED HIATAL HERNIA REPAIR N/A 08/30/2023   Procedure: XI ROBOTIC ASSISTED HIATAL HERNIA REPAIR;  Surgeon: [name], MD;  Location: [facility];  Service: General;  Laterality: N/A;    Family History  Problem Relation Age of Onset   Diabetes Mother    Hypertension Mother    Hyperlipidemia Father    Heart disease Father        Pacemaker   Dementia Father    Thyroid  disease Sister    Schizophrenia Brother    Pancreatic cancer Cousin 73     Social Connections: Socially Isolated (10/17/2023)   Social Connection and Isolation Panel [NHANES]    Frequency of Communication with Friends and Family: Never    Frequency of Social Gatherings with Friends and Family: Never    Attends Religious Services: Never    Database administrator or Organizations: Yes    Attends Engineer, structural: 1 to 4 times per year    Marital Status: Never married      Current Outpatient Medications:    amoxicillin -clavulanate (AUGMENTIN) 875-125 MG tablet, Take 1 tablet by mouth 2 (two) times daily for 7 days., Disp: 14 tablet, Rfl: 0   Calcium  Citrate-Vitamin D  (CALCIUM   + D PO), Take 1 tablet by mouth daily., Disp: , Rfl:    Carboxymethylcellulose Sodium (THERATEARS) 0.25 % SOLN, Place 1 drop into both eyes daily as needed (Dry eyes)., Disp: , Rfl:    cetirizine  (ZYRTEC ) 10 MG tablet, Take 1 tablet (10 mg total) by mouth daily., Disp: 90 tablet, Rfl: 1   Cyanocobalamin  (B-12 PO), Take 1,000 mcg by mouth daily., Disp: , Rfl:    ELIQUIS  5 MG TABS tablet, TAKE 1 TABLET BY MOUTH TWICE A DAY, Disp: 180 tablet, Rfl: 0   ferrous sulfate  325 (65 FE) MG tablet, Take 1 tablet (325 mg total) by mouth daily., Disp: 90 tablet, Rfl: 1   ibuprofen  (ADVIL ) 600 MG tablet, Take 1 tablet (600 mg total) by mouth every 6 (six) hours as needed., Disp: 30 tablet, Rfl: 0   levothyroxine  (SYNTHROID ) 75 MCG tablet,  Take 1 tablet (75 mcg total) by mouth every morning. 30 minutes before food, Disp: 90 tablet, Rfl: 3   lisinopril  (ZESTRIL ) 20 MG tablet, Take 1 tablet (20 mg total) by mouth at bedtime., Disp: 90 tablet, Rfl: 3   pantoprazole  (PROTONIX ) 40 MG tablet, TAKE 1 TABLET BY MOUTH EVERY DAY, Disp: 90 tablet, Rfl: 1   QUEtiapine  (SEROQUEL ) 50 MG tablet, Take 1 tablet (50 mg total) by mouth at bedtime., Disp: 90 tablet, Rfl: 2   triamterene -hydrochlorothiazide  (DYAZIDE ) 37.5-25 MG capsule, Take 1 capsule by mouth daily., Disp: , Rfl:    venlafaxine  XR (EFFEXOR -XR) 75 MG 24 hr capsule, TAKE THREE CAPSULES BY MOUTH ONCE DAILY, Disp: 270 capsule, Rfl: 2   Physical Exam:   BP (!) 160/96 (BP Location: Right Arm, Patient Position: Sitting, Cuff Size: Normal)   Pulse 100   Ht 5\' 3"  (1.6 m)   Wt 174 lb (78.9 kg)   LMP  (LMP Unknown) Comment: not sexually active  SpO2 95%   BMI 30.82 kg/m   Salient findings:  CN II-XII intact  Bilateral EAC clear and TM intact with well pneumatized middle ear spaces Anterior rhinoscopy: Septum intact; bilateral inferior turbinates without significant hypertrophy No lesions of oral cavity/oropharynx; dentition fair; unable to appreciate any tonsil  stones, tonsils 1/1, small stones No obviously palpable neck masses/lymphadenopathy/thyromegaly No respiratory distress or stridor  Seprately Identifiable Procedures:  Prior to initiating any procedures, risks/benefits/alternatives were explained to the patient and verbal consent obtained. Procedure Note Pre-procedure diagnosis: Tonsillitis, sore throat Post-procedure diagnosis: Same Procedure: Transnasal Fiberoptic Laryngoscopy, CPT 31575 - Mod 25 Indication: see above Complications: None apparent EBL: 0 mL  The procedure was undertaken to further evaluate the patient's complaint above, with mirror exam inadequate for appropriate examination due to gag reflex and poor patient tolerance  Procedure:  Patient was identified as correct patient. Verbal consent was obtained. The nose was sprayed with oxymetazoline and 4% lidocaine . The The flexible laryngoscope was passed through the nose to view the nasal cavity, pharynx (oropharynx, hypopharynx) and larynx.  The larynx was examined at rest and during multiple phonatory tasks. Documentation was obtained and reviewed with patient. The scope was removed. The patient tolerated the procedure well.  Findings: The nasal cavity and nasopharynx did not reveal any masses or lesions, mucosa appeared to be without obvious lesions. The tongue base, pharyngeal walls, piriform sinuses, vallecula, epiglottis and postcricoid region are normal in appearance - no foreign bodies noted, no abscess or significant inflammation noted. Small tonsil stone left. The visualized portion of the subglottis and proximal trachea is widely patent. The vocal folds are mobile bilaterally. There are no lesions on the free edge of the vocal folds nor elsewhere in the larynx worrisome for malignancy.       Electronically signed by: Evelina Hippo, MD 01/17/2024 1:36 PM   Impression & Plans:  Quintara Bost is a 63 y.o. female with:  1. Tonsillitis   2. Sore throat     Suspect she has a URI/tonsillitis with resultant sore throat. She was quite afraid of swallowing after the foreign object(?) - unclear (tonsil stone?) came out but TFL is reassuring. We discussed options and I recommended supportive care currently. She was relieved to see the TFL results. Will send in rescue prescription of augmentin BID x7d if she gets worse Return precautions discussed F/u as needed or if persistent sx  See below regarding exact medications prescribed this encounter including dosages and route: Meds ordered this encounter  Medications  amoxicillin -clavulanate (AUGMENTIN) 875-125 MG tablet    Sig: Take 1 tablet by mouth 2 (two) times daily for 7 days.    Dispense:  14 tablet    Refill:  0      Thank you for allowing me the opportunity to care for your patient. Please do not hesitate to contact me should you have any other questions.  Sincerely, Milon Aloe, MD Otolaryngologist (ENT), [facility] Phone: [phone] Fax: [phone]  01/17/2024, 1:36 PM   MDM:  Level 4 - [phone] Complexity/Problems addressed: mod - acute problem, systemic sx Data complexity: mod  independent review of notes, labs - Morbidity: mod  - Prescription Drug prescribed or managed: y

## 2024-01-18 ENCOUNTER — Encounter (INDEPENDENT_AMBULATORY_CARE_PROVIDER_SITE_OTHER): Payer: Self-pay | Admitting: Otolaryngology

## 2024-02-07 ENCOUNTER — Encounter: Payer: Self-pay | Admitting: Family Medicine

## 2024-02-07 ENCOUNTER — Ambulatory Visit (INDEPENDENT_AMBULATORY_CARE_PROVIDER_SITE_OTHER): Admitting: Family Medicine

## 2024-02-07 VITALS — BP 158/80 | HR 94 | Ht 63.0 in | Wt 173.6 lb

## 2024-02-07 DIAGNOSIS — N6310 Unspecified lump in the right breast, unspecified quadrant: Secondary | ICD-10-CM

## 2024-02-07 DIAGNOSIS — G4733 Obstructive sleep apnea (adult) (pediatric): Secondary | ICD-10-CM | POA: Diagnosis not present

## 2024-02-07 DIAGNOSIS — I10 Essential (primary) hypertension: Secondary | ICD-10-CM | POA: Diagnosis not present

## 2024-02-07 NOTE — Patient Instructions (Signed)
 It was great to see you again today.  To schedule your mammogram, call the [facility] at [phone]. They are located at [address].  Ordering CPAP for you  Follow up with me a month after you start the CPAP to see how you are doing.   Be well, Dr. Donah

## 2024-02-07 NOTE — Progress Notes (Unsigned)
  Date of Visit: 02/07/2024   SUBJECTIVE:   HPI:  Sherry Anderson presents today for follow-up of sleep study.  OSA: Recently had sleep study showing moderate OSA with AHI 26.6.  She is amenable to starting CPAP therapy.  Breast lumps: Reports having identified some bumps in her right breast for about 2 months.  Denies nipple drainage or bleeding.  Hypertension: Currently taking lisinopril  20 mg daily.  Remains off of her triamterene -HCTZ.   OBJECTIVE:   BP (!) 158/80   Pulse 94   Ht 5' 3 (1.6 m)   Wt 173 lb 9.6 oz (78.7 kg)   LMP  (LMP Unknown) Comment: not sexually active  SpO2 100%   BMI 30.75 kg/m  Gen: No acute distress, pleasant, cooperative, well-appearing HEENT: Normocephalic, atraumatic Heart: Regular rate and rhythm, no murmur Lungs: Clear bilaterally, normal effort Breasts: bilateral breasts normal in appearance. No erythema, deformity, or nipple discharge. No palpable abnormal masses of left breast.  Some fullness in 10 to 11 o'clock position of right breast, though no discrete masses palpated.  No axillary lymphadenopathy.  Exam chaperoned by Casimir Griffins, CMA Neuro: Alert, grossly nonfocal, speech normal   ASSESSMENT/PLAN:   Assessment & Plan HYPERTENSION, BENIGN ESSENTIAL BP elevated today, though sounds as though it has been well-controlled at home.  Patient will start checking it regularly and let me know if it is persistently above 140/90. OSA (obstructive sleep apnea) Recent diagnosis.  Willing to start CPAP.  Orders entered, will have nursing team coordinate with home health agency. Mass of right breast, unspecified quadrant Diagnostic mammogram previously ordered.  Gave patient phone number to call and schedule this study.    FOLLOW UP: Follow up in 1 month after starting CPAP for OSA  [place] J. Donah, MD [facility]

## 2024-02-08 ENCOUNTER — Encounter (INDEPENDENT_AMBULATORY_CARE_PROVIDER_SITE_OTHER): Payer: Self-pay

## 2024-02-09 DIAGNOSIS — G4733 Obstructive sleep apnea (adult) (pediatric): Secondary | ICD-10-CM | POA: Insufficient documentation

## 2024-02-09 NOTE — Assessment & Plan Note (Signed)
 Recent diagnosis.  Willing to start CPAP.  Orders entered, will have nursing team coordinate with home health agency.

## 2024-02-09 NOTE — Assessment & Plan Note (Signed)
 BP elevated today, though sounds as though it has been well-controlled at home.  Patient will start checking it regularly and let me know if it is persistently above 140/90.

## 2024-02-10 ENCOUNTER — Inpatient Hospital Stay: Attending: Hematology

## 2024-02-10 DIAGNOSIS — Z853 Personal history of malignant neoplasm of breast: Secondary | ICD-10-CM | POA: Diagnosis not present

## 2024-02-10 DIAGNOSIS — R760 Raised antibody titer: Secondary | ICD-10-CM | POA: Insufficient documentation

## 2024-02-10 DIAGNOSIS — I81 Portal vein thrombosis: Secondary | ICD-10-CM | POA: Insufficient documentation

## 2024-02-10 DIAGNOSIS — Z86718 Personal history of other venous thrombosis and embolism: Secondary | ICD-10-CM | POA: Diagnosis not present

## 2024-02-10 DIAGNOSIS — E611 Iron deficiency: Secondary | ICD-10-CM | POA: Insufficient documentation

## 2024-02-10 LAB — CBC WITH DIFFERENTIAL (CANCER CENTER ONLY)
Abs Immature Granulocytes: 0.01 10*3/uL (ref 0.00–0.07)
Basophils Absolute: 0.1 10*3/uL (ref 0.0–0.1)
Basophils Relative: 1 %
Eosinophils Absolute: 0.2 10*3/uL (ref 0.0–0.5)
Eosinophils Relative: 5 %
HCT: 42.4 % (ref 36.0–46.0)
Hemoglobin: 14.4 g/dL (ref 12.0–15.0)
Immature Granulocytes: 0 %
Lymphocytes Relative: 42 %
Lymphs Abs: 2.1 10*3/uL (ref 0.7–4.0)
MCH: 29.3 pg (ref 26.0–34.0)
MCHC: 34 g/dL (ref 30.0–36.0)
MCV: 86.2 fL (ref 80.0–100.0)
Monocytes Absolute: 0.4 10*3/uL (ref 0.1–1.0)
Monocytes Relative: 9 %
Neutro Abs: 2.1 10*3/uL (ref 1.7–7.7)
Neutrophils Relative %: 43 %
Platelet Count: 199 10*3/uL (ref 150–400)
RBC: 4.92 MIL/uL (ref 3.87–5.11)
RDW: 14.4 % (ref 11.5–15.5)
WBC Count: 4.9 10*3/uL (ref 4.0–10.5)
nRBC: 0 % (ref 0.0–0.2)

## 2024-02-12 LAB — CARDIOLIPIN ANTIBODIES, IGG, IGM, IGA
Anticardiolipin IgA: <9 U/mL (ref 0–11)
Anticardiolipin IgG: <9 GPL U/mL (ref 0–14)
Anticardiolipin IgM: 27 [MPL'U]/mL — ABNORMAL HIGH (ref 0–12)

## 2024-02-12 LAB — PROTEIN S PANEL
Protein S Activity: 66 % (ref 63–140)
Protein S Ag, Free: 107 % (ref 61–136)
Protein S Ag, Total: 93 % (ref 60–150)

## 2024-02-14 ENCOUNTER — Telehealth: Payer: Self-pay

## 2024-02-14 NOTE — Telephone Encounter (Signed)
 Patient calls nurse line requesting to speak with PCP.   She reports her blood pressure has been running high since her OV with PCP last week. She reports home BPs in 170s and 180s systolic. Unsure of diastolic.  She reports she decided to resume her BP medication, Triamterene -Hydrochlorothiazide . She reports she started taking this on Thursday.  She reports on Friday she felt fatigue, weakness and shortness of breath. She reports her BP has been ranging in the mid teens and 120s systolic. Unsure of diastolic.   She denies any syncope episodes, vision changes, dizziness, chest pains or fevers. She was speaking in full sentences.   Patient advised a clinic evaluation. Patient scheduled for tomorrow morning with [place].   ED precautions discussed with patient in the meantime.

## 2024-02-14 NOTE — Telephone Encounter (Signed)
 Agree with in person evaluation, thank you! Laymon JINNY Legions, MD

## 2024-02-15 ENCOUNTER — Ambulatory Visit (INDEPENDENT_AMBULATORY_CARE_PROVIDER_SITE_OTHER): Payer: Self-pay | Admitting: Student

## 2024-02-15 ENCOUNTER — Inpatient Hospital Stay: Attending: Hematology | Admitting: Hematology

## 2024-02-15 ENCOUNTER — Encounter: Payer: Self-pay | Admitting: Student

## 2024-02-15 VITALS — BP 116/77 | HR 89 | Wt 172.6 lb

## 2024-02-15 DIAGNOSIS — I10 Essential (primary) hypertension: Secondary | ICD-10-CM | POA: Diagnosis not present

## 2024-02-15 DIAGNOSIS — I81 Portal vein thrombosis: Secondary | ICD-10-CM | POA: Diagnosis not present

## 2024-02-15 DIAGNOSIS — R760 Raised antibody titer: Secondary | ICD-10-CM | POA: Diagnosis not present

## 2024-02-15 MED ORDER — LISINOPRIL 40 MG PO TABS: 20.0000 mg | ORAL_TABLET | Freq: Every day | ORAL | 2 refills | Status: DC

## 2024-02-15 NOTE — Progress Notes (Signed)
    SUBJECTIVE:   CHIEF COMPLAINT / HPI:   Sherry Anderson is a 63 year old female with hypertension who presents with high blood pressure readings at home.  She experiences elevated blood pressure readings at home when taking only lisinopril  .She restarted Triamterene -hydrochlorothiazide   last week after a reading of 190 mmHg. Since then BP improved, however she feels unwell with symptoms of fatigue, shakiness, and joint pain. She discontinued Triamterene -hydrochlorothiazide  yesterday without noticing improvement. She continues lisinopril  20 mg without adverse effects. No increased urination.  PERTINENT  PMH / PSH: Reviewed   OBJECTIVE:   BP 116/77   Pulse 89   Wt 172 lb 9.6 oz (78.3 kg)   LMP  (LMP Unknown) Comment: not sexually active  SpO2 98%   BMI 30.57 kg/m    Physical Exam General: Alert, well appearing, NAD Cardiovascular: RRR, No Murmurs, Normal S2/S2 Respiratory: CTAB, No wheezing or Rales Abdomen: No distension or tenderness Extremities: No edema on extremities     ASSESSMENT/PLAN:   HYPERTENSION, BENIGN ESSENTIAL Normal BP today. Elevated blood pressure at home. Lisinopril  tolerated well. Triamterene  unlikely cause of symptoms however via shared decision patient would like to discontinue the triamterene -hydrochlorothiazide  combo pill and amendable to increased dose of Lisinopril . - Discontinue triamterene .-HCTZ - Increase lisinopril  to 40 mg daily. - Monitor blood pressure daily and record readings. - Follow up with PCP in two weeks.   Norleen April, MD [facility]

## 2024-02-15 NOTE — Progress Notes (Signed)
 HEMATOLOGY/ONCOLOGY PHONE VISIT NOTE  Date of Service: 02/15/2024  Patient Care Team: Donah Laymon PARAS, MD as PCP - General (Family Medicine) Anner Alm ORN, MD as PCP - Cardiology (Cardiology) Skeet Juliene SAUNDERS, DO as Consulting Physician (Neurology) Georgia Blonder, MD as Consulting Physician (Obstetrics and Gynecology)  CHIEF COMPLAINTS/PURPOSE OF CONSULTATION:   Evaluation and management of portal vein thrombosis  HISTORY OF PRESENTING ILLNESS:   Sherry Anderson is a wonderful 63 y.o. female who has been referred to us  by Dr. Laymon Donah, MD for a discussion whether indefinite anticoagulation is warranted. The pt reports that she is doing well overall.  The pt reports that recently she has not been feeling well. She notes major fatigue and SOB. The pt claims that she is unsure as to why she feels this way, noting that fatigue has caused major sweats. The pt reports she had a surgery in June that helped with the trigeminal nerve pain. They noticed a clot in her LE in  August 2021 and was put on Eliquis . A few months later, the pt notes major SOB that came on unexpectedly after minor movement. They discovered a minor lung PE during this that they deemed unprovoked. The pt notes this SOB was new and she has not been as active recently. These symptoms have not resolved since. The pt is on a blood thinner currently and it was thought the Eliquis  was not working, thus switching her to Lovenox . This pt also has a major Hiatal Hernia currently that is great in size. The pt notes that due to her symptoms she is not moving around much and is fairly immobile. The pt was also found to be iron  deficient and was put on ferrous Sulfate  325 mg BID. She has stayed consistent with this and notes no issues tolerating.  The pt also noted that her PCP recommended for the pt to see a Psychiatrist for management of her depression medication and management of her fatigue, sweats, and SOB. The pt  has not talked to her Psychiatrist recently due to the stigma surrounding it and her beliefs regarding the situation.  The pt notes that she has two pregnancies and three kids, no miscarriages. The pt denies any use or birth control ever. She notes no issues regarding her pregnancies. She notes her mother has a history of being on Eliquis , but is unsure of the reason for this. She notes no familial blood disorders or autoimmune diseases.  On review of systems, pt reports depression, fatigue, SOB, sweats, neck/shoulder pain, floating sensation in head and denies constipation, diarrhea, lower back pain, abdominal pain, leg swelling, dizziness, lightheadedness, headaches, spinning feeling, sudden weight loss, decreased appetite, and any other symptoms.  INTERVAL HISTORY:  Sherry Anderson is a 63 y.o. female here for returns for evaluation and management of portal vein thrombosis.   Patient was last connected with me via telemedicine visit on 11/16/2023 and was doing well overall with no new medical concerns.   I connected with Antonetta BIRCH Rilla on 02/15/24 at  3:30 PM EDT by telephone visit and verified that I am speaking with the correct person using two identifiers.   I discussed the limitations, risks, security and privacy concerns of performing an evaluation and management service by telemedicine and the availability of in-person appointments. I also discussed with the patient that there may be a patient responsible charge related to this service. The patient expressed understanding and agreed to proceed.   Other persons participating in the  visit and their role in the encounter: none   Patient's location: home  Provider's location: [facility]   Chief Complaint: evaluation and management of portal vein thrombosis    The results of her recent lab workup from 02/10/2024 was discussed with patient in detail.  MEDICAL HISTORY:  Past Medical History:  Diagnosis Date   Adenocarcinoma in situ  (AIS) of uterine cervix 05/2017   Associated with high-grade dysplasia   Anemia    Anxiety and depression    Arthritis    Breast cancer (HCC) 2002   left breast cancer    Cerebral aneurysm 2021   had craniotomy MVD at [facility]   Depression    Diverticulosis of colon    DVT (deep venous thrombosis) (HCC) 2021   provoked by surgery   Essential hypertension, benign    Family history of pancreatic cancer    GERD (gastroesophageal reflux disease)    Heart murmur    systolic ejection murmur   had ECHO   Hepatitis    unsure of which type   High grade squamous intraepithelial cervical dysplasia 05/2017   Associated with adenocarcinoma in situ   History of adenomatous polyp of colon    tubular adenoma's  02-08-2017   History of gastritis    History of hiatal hernia    History of left breast cancer 2002---- per pt no recurrence   dx DCIS --- s/p  left breast lumpectomy;  per pt radiation therapy completed same year and completed antiestrogen therapy    History of radiation therapy    2002   left breast   History of trigeminal neuralgia    Hypothyroidism    Internal hemorrhoids    Pain    right ear and jaw   Personal history of radiation therapy    Pre-diabetes    Spinal stenosis    Spondylolisthesis    SVD (spontaneous vaginal delivery)    x 1   Tremor of both hands    Tubular adenoma of colon     SURGICAL HISTORY: Past Surgical History:  Procedure Laterality Date   BRAIN SURGERY  01/25/2020   craniotomy MVD at [facility]   BREAST LUMPECTOMY Left 2002   ductal CA in situ   CERVICAL CONIZATION W/BX N/A 06/08/2017   Procedure: CONIZATION CERVIX WITH BIOPSY;  Surgeon: Rockney Evalene SQUIBB, MD;  Location: [facility];  Service: Gynecology;  Laterality: N/A;   CESAREAN SECTION  1990   twins   COLONOSCOPY  02-08-2017   dr gustav   CRANIECTOMY  02/12/2020   VP shunt following craniotomy   DILATATION & CURETTAGE/HYSTEROSCOPY WITH MYOSURE N/A 06/08/2017    Procedure: DILATATION & CURETTAGE/HYSTEROSCOPY WITH MYOSURE;  Surgeon: Rockney Evalene SQUIBB, MD;  Location: [facility];  Service: Gynecology;  Laterality: N/A;   ESOPHAGOGASTRODUODENOSCOPY  last one 11-24-2016   dr gustav   ROBOTIC ASSISTED TOTAL HYSTERECTOMY WITH BILATERAL SALPINGO OOPHERECTOMY N/A 09/21/2017   Procedure: XI ROBOTIC ASSISTED TOTAL HYSTERECTOMY WITH BILATERAL SALPINGO OOPHORECTOMY, Lysis of Adhesions;  Surgeon: Lavoie, Marie-Lyne, MD;  Location: [facility];  Service: Gynecology;  Laterality: N/A;   TOOTH EXTRACTION     TRANSFORAMINAL LUMBAR INTERBODY FUSION (TLIF) WITH PEDICLE SCREW FIXATION 2 LEVEL N/A 04/13/2018   Procedure: RIGHT-SIDED LUMBAR 4-5 AND LUMBAR 5-SACRUM 1 TRANSFORAMINAL LUMBAR INTERBODY FUSION WITH INSTRUMENTATION AND ALLOGRAFT;  Surgeon: Beuford Anes, MD;  Location: [facility];  Service: Orthopedics;  Laterality: N/A;   UPPER GI ENDOSCOPY N/A 08/30/2023   Procedure: UPPER ENDOSCOPY;  Surgeon: Tanda Locus,  MD;  Location: [facility];  Service: General;  Laterality: N/A;   WISDOM TOOTH EXTRACTION     XI ROBOTIC ASSISTED HIATAL HERNIA REPAIR N/A 08/30/2023   Procedure: XI ROBOTIC ASSISTED HIATAL HERNIA REPAIR;  Surgeon: Tanda Locus, MD;  Location: [facility];  Service: General;  Laterality: N/A;    SOCIAL HISTORY: Social History   Socioeconomic History   Marital status: Legally Separated    Spouse name: Not on file   Number of children: 3   Years of education: 6   Highest education level: 6th grade  Occupational History   Occupation: Disability  Tobacco Use   Smoking status: Never    Passive exposure: Never   Smokeless tobacco: Never  Vaping Use   Vaping status: Never Used  Substance and Sexual Activity   Alcohol  use: No   Drug use: No   Sexual activity: Not Currently    Partners: Male    Birth control/protection: Surgical    Comment: 1st intercourse 63 yo-Fewer than 5 partners, hysterectomy  Other Topics Concern   Not on file  Social History  Narrative   Moved from [place] to [place]  1993, to [place] 1998   Lives at home with her nephew and her son.   Right-handed.   Occasional use of caffeine.   Social Drivers of Health   Financial Resource Strain: Medium Risk (10/17/2023)   Overall Financial Resource Strain (CARDIA)    Difficulty of Paying Living Expenses: Somewhat hard  Food Insecurity: No Food Insecurity (10/17/2023)   Hunger Vital Sign    Worried About Running Out of Food in the Last Year: Never true    Ran Out of Food in the Last Year: Never true  Transportation Needs: No Transportation Needs (10/17/2023)   PRAPARE - Administrator, Civil Service (Medical): No    Lack of Transportation (Non-Medical): No  Physical Activity: Inactive (10/17/2023)   Exercise Vital Sign    Days of Exercise per Week: 0 days    Minutes of Exercise per Session: 0 min  Stress: Stress Concern Present (10/17/2023)   [employer] of Occupational Health - Occupational Stress Questionnaire    Feeling of Stress : To some extent  Social Connections: Socially Isolated (10/17/2023)   Social Connection and Isolation Panel    Frequency of Communication with Friends and Family: Never    Frequency of Social Gatherings with Friends and Family: Never    Attends Religious Services: Never    Database administrator or Organizations: Yes    Attends Banker Meetings: 1 to 4 times per year    Marital Status: Never married  Intimate Partner Violence: Not At Risk (10/17/2023)   Humiliation, Afraid, Rape, and Kick questionnaire    Fear of Current or Ex-Partner: No    Emotionally Abused: No    Physically Abused: No    Sexually Abused: No    FAMILY HISTORY: Family History  Problem Relation Age of Onset   Diabetes Mother    Hypertension Mother    Hyperlipidemia Father    Heart disease Father        Pacemaker   Dementia Father    Thyroid  disease Sister    Schizophrenia Brother    Pancreatic cancer Cousin 74    ALLERGIES:  has no  known allergies.  MEDICATIONS:  Current Outpatient Medications  Medication Sig Dispense Refill   Calcium  Citrate-Vitamin D  (CALCIUM  + D PO) Take 1 tablet by mouth daily.     Carboxymethylcellulose Sodium (THERATEARS) 0.25 %  SOLN Place 1 drop into both eyes daily as needed (Dry eyes).     cetirizine  (ZYRTEC ) 10 MG tablet Take 1 tablet (10 mg total) by mouth daily. 90 tablet 1   Cyanocobalamin  (B-12 PO) Take 1,000 mcg by mouth daily.     ELIQUIS  5 MG TABS tablet TAKE 1 TABLET BY MOUTH TWICE A DAY 180 tablet 0   ferrous sulfate  325 (65 FE) MG tablet Take 1 tablet (325 mg total) by mouth daily. 90 tablet 1   ibuprofen  (ADVIL ) 600 MG tablet Take 1 tablet (600 mg total) by mouth every 6 (six) hours as needed. 30 tablet 0   levothyroxine  (SYNTHROID ) 75 MCG tablet Take 1 tablet (75 mcg total) by mouth every morning. 30 minutes before food 90 tablet 3   lisinopril  (ZESTRIL ) 20 MG tablet Take 1 tablet (20 mg total) by mouth at bedtime. 90 tablet 3   pantoprazole  (PROTONIX ) 40 MG tablet TAKE 1 TABLET BY MOUTH EVERY DAY 90 tablet 1   QUEtiapine  (SEROQUEL ) 50 MG tablet Take 1 tablet (50 mg total) by mouth at bedtime. 90 tablet 2   venlafaxine  XR (EFFEXOR -XR) 75 MG 24 hr capsule TAKE THREE CAPSULES BY MOUTH ONCE DAILY 270 capsule 2   No current facility-administered medications for this visit.    REVIEW OF SYSTEMS:    10 Point review of Systems was done is negative except as noted above.   PHYSICAL EXAMINATION: TELEMEDICINE VISIT  LABORATORY DATA:  I have reviewed the data as listed  .    Latest Ref Rng & Units 02/10/2024   10:16 AM 11/02/2023   10:49 AM 10/21/2023   10:54 AM  CBC  WBC 4.0 - 10.5 K/uL 4.9  3.4  3.8   Hemoglobin 12.0 - 15.0 g/dL 85.5  87.9  87.4   Hematocrit 36.0 - 46.0 % 42.4  36.1  36.2   Platelets 150 - 400 K/uL 199  165  171     .    Latest Ref Rng & Units 11/02/2023   10:49 AM 10/21/2023   10:54 AM 09/26/2023   11:04 AM  CMP  Glucose 70 - 99 mg/dL 91  87  873   BUN 8  - 23 mg/dL 15  13  9    Creatinine 0.44 - 1.00 mg/dL 9.30  9.21  9.06   Sodium 135 - 145 mmol/L 141  143  141   Potassium 3.5 - 5.1 mmol/L 3.5  4.1  3.8   Chloride 98 - 111 mmol/L 107  105  104   CO2 22 - 32 mmol/L 26  22  20    Calcium  8.9 - 10.3 mg/dL 9.2  9.4  8.9   Total Protein 6.5 - 8.1 g/dL 7.5   6.9   Total Bilirubin 0.0 - 1.2 mg/dL 0.6   0.3   Alkaline Phos 38 - 126 U/L 77   134   AST 15 - 41 U/L 18   20   ALT 0 - 44 U/L 17   15      RADIOGRAPHIC STUDIES: I have personally reviewed the radiological images as listed and agreed with the findings in the report. No results found.   ASSESSMENT & PLAN:   63 yo female with   1) Provoked LLE extensive DVT in 03/2020 - femoral and popliteal- provoked by craniotomy/microvascular decompression with infection complication and VP shunt  2) Pulmonary embolism - tiny right sided without rt heart strain. CTA chest. Likely occurred with DVT but was picked on CTA  chest later. Would not be considered separately unprovoked and not considered Eliquis  failure. 3) Portal vein thrombosis in the context of hiatal hernia surgery.  PLAN:  -Discussed lab results from 02/10/2024 in detail with patient. CBC showed WBC of 4.9K, hemoglobin of 14.4, and platelets of 199K. -her blood counts normalized after surgery -anticardiolipin antibodies remains elevated -protein S levels normalized -we discussed that the portal vein thrombosis was atleast partly provoked by surgery but the persistently elevated cardiolipin IgM antibody could be an additional risk factor for recurrent VTE and we would recommend long term anticoagulation with Eliquis  and continue f/u and management with PCP  FOLLOW UP: RTC with PCP  The total time spent in the appointment was 15 minutes* .  All of the patient's questions were answered with apparent satisfaction. The patient knows to call the clinic with any problems, questions or concerns.   Emaline Saran MD MS AAHIVMS [facility]  [facility] Hematology/Oncology Physician [facility]  .*Total Encounter Time as defined by the Centers for Medicare and Medicaid Services includes, in addition to the face-to-face time of a patient visit (documented in the note above) non-face-to-face time: obtaining and reviewing outside history, ordering and reviewing medications, tests or procedures, care coordination (communications with other health care professionals or caregivers) and documentation in the medical record.    I,Mitra Faeizi,acting as a Neurosurgeon for Emaline Saran, MD.,have documented all relevant documentation on the behalf of Emaline Saran, MD,as directed by  Emaline Saran, MD while in the presence of Emaline Saran, MD.  .I have reviewed the above documentation for accuracy and completeness, and I agree with the above. .Lon Klippel Kishore Basia Mcginty MD

## 2024-02-15 NOTE — Assessment & Plan Note (Signed)
 Normal BP today. Elevated blood pressure at home. Lisinopril  tolerated well. Triamterene  unlikely cause of symptoms however via shared decision patient would like to discontinue the triamterene -hydrochlorothiazide  combo pill and amendable to increased dose of Lisinopril . - Discontinue triamterene .-HCTZ - Increase lisinopril  to 40 mg daily. - Monitor blood pressure daily and record readings. - Follow up with PCP in two weeks.

## 2024-02-15 NOTE — Patient Instructions (Addendum)
 Un placer verle hoy.  Su presin arterial hoy estuvo normal.  Dado que an no se siente bien, le recomiendo suspender el medicamento triamtereno-hidroclorotiazida hoy mismo.  Le he aumentado la dosis de lisinopril  a 40 mg al [employer]. Contine tomando 2 comprimidos de los 20 mg que toma actualmente.  Por favor, lleve un registro diario de su presin arterial y consulte con su mdico de cabecera en 2 semanas para que vuelva a evaluarla con el nuevo cambio.   Pleasure to see you today.  Blood pressure today was normal.  Given that you do not feel well yet triamterene -hydrochlorothiazide  medication and I recommend discontinuing that today.  I have increased your lisinopril  to 40 mg daily.  Continue to take 2 tablets of the 20 mg that you currently have.  Please keep a daily log of your blood pressures and follow-up in 2 weeks with your PCP to reassess your blood pressure with the new change.

## 2024-02-27 ENCOUNTER — Ambulatory Visit: Admitting: Family Medicine

## 2024-02-28 ENCOUNTER — Ambulatory Visit
Admission: RE | Admit: 2024-02-28 | Discharge: 2024-02-28 | Disposition: A | Discharge status: Home / Self Care | Source: Ambulatory Visit | Attending: Family Medicine | Admitting: Family Medicine

## 2024-02-28 ENCOUNTER — Ambulatory Visit (INDEPENDENT_AMBULATORY_CARE_PROVIDER_SITE_OTHER)

## 2024-02-28 ENCOUNTER — Telehealth: Payer: Self-pay

## 2024-02-28 VITALS — BP 151/84 | HR 65 | Wt 173.8 lb

## 2024-02-28 DIAGNOSIS — N6311 Unspecified lump in the right breast, upper outer quadrant: Secondary | ICD-10-CM | POA: Diagnosis not present

## 2024-02-28 DIAGNOSIS — M79672 Pain in left foot: Secondary | ICD-10-CM

## 2024-02-28 DIAGNOSIS — N63 Unspecified lump in unspecified breast: Secondary | ICD-10-CM

## 2024-02-28 DIAGNOSIS — I10 Essential (primary) hypertension: Secondary | ICD-10-CM | POA: Diagnosis not present

## 2024-02-28 DIAGNOSIS — R92331 Mammographic heterogeneous density, right breast: Secondary | ICD-10-CM | POA: Diagnosis not present

## 2024-02-28 MED ORDER — DICLOFENAC SODIUM 1 % EX GEL
2.0000 g | Freq: Four times a day (QID) | CUTANEOUS | 1 refills | Status: DC | PRN
Start: 2024-02-28 — End: 2024-05-29

## 2024-02-28 MED ORDER — BLOOD PRESSURE CUFF MISC: 0 refills | Status: AC

## 2024-02-28 MED ORDER — LISINOPRIL 40 MG PO TABS: 40.0000 mg | ORAL_TABLET | Freq: Every day | ORAL | 2 refills | Status: DC

## 2024-02-28 NOTE — Progress Notes (Signed)
 SUBJECTIVE:   CHIEF COMPLAINT / HPI:   Sherry Anderson is a very pleasant 63yo F who presents to the office for HTN follow up. She states her BP has remained high at home with systolic readings in the 170-190s/80s. She reports intermittent headaches, about 2/week, which resolve with Tylenol  use. She does not feel as though these headaches correlate with an increase in her BP. She denies any vision changes, N/V, recent illness, new stressors, or any other complaints at this time.Patient states she uses an arm BP cuff that is quite old and was her mothers.   She was referred to home health for a CPAP machine but never received a call for set up and so has not begin treatment for her moderate OSA.    L foot pain: x1 week. Described as achey to the top of her foot, from the base of the great toe to the ankle. Patient denies any recent trauma, new footwear, insect bites, swelling, overlying skin changes, or any other complaints. She is worried because of a prior blood clot. Arnica cream and massage of the top of the foot at night has been helpful and she does not wake up with pain. It worsens over the course of the day as she walks.   PERTINENT  PMH / PSH: anxiety, depression, HTN, portal vein thrombosis, DVT  OBJECTIVE:   BP (!) 151/84   Pulse 65   Wt 173 lb 12.8 oz (78.8 kg)   LMP  (LMP Unknown) Comment: not sexually active  SpO2 94%   BMI 30.79 kg/m    General: A&O, NAD HEENT: No sign of trauma, EOM grossly intact Respiratory: normal WOB GI: non-distended  Extremities: no peripheral edema, no overlying skin changes of the L foot, mild TTP of the dorsal aspect of the medial mid-L foot, full ROM of toes and ankle, no edema, negative Homan's sign, no TTP of the entire LLE Neuro: Normal gait, moves all four extremities appropriately, no sensation deficits to bilateral feet, no muscle weakness  Skin: no lesions/rashes visualized Psych: Appropriate mood and affect   ASSESSMENT/PLAN:    Assessment & Plan Essential hypertension, benign High home readings likely to be related to an outdated cuff. Patient has had no new life changes. She has been taking her lisinopril  40mg  at bedtime appropriately. Unable to begin treatment for OSA with a CPAP. Increasing BP medication not appropriate at this time, would like for patient to continue on current regimen.  -Ordered BP cuff through [facility] which Medicaid will cover 100%. If not due to secondary coverage, explained to patient to call Medicare and see if/what they will cover regarding a new BP cuff. Patient understands she may reach out anytime to the clinic for help regarding this. Once received, check BP only once a day and keep a diary to bring to office visits.  -Continue home lisinopril  40mg  at bedtime. Refill has been sent to pharmacy.  -Initial office BP in the 140s. Persistent HTN likely a factor of untreated OSA as well. Will reach out to home health to find out what happened to referral/CPAP machine and keep patient updated as I receive news.  -Obtain BMP today to monitor renal function.  Left foot pain Most likely tendon strain or tendinitis of the extensor hallucis longus tendon. Went over red flag symptoms of DVT and reassured patient this is not the same presentation, especially on Eliquis . Patient verbalizes understanding and is aware of when to visit the ED.  -Continue home arnica cream  and massage. -You may try Voltaren  Gel that I have sent into the pharmacy for extra pain relief as needed.    Camie Dixons, DO [facility]

## 2024-02-28 NOTE — Telephone Encounter (Signed)
 Community message sent to [employer] regarding CPAP machine.   Will await response.   Veronda Prude, RN

## 2024-02-28 NOTE — Assessment & Plan Note (Addendum)
 High home readings likely to be related to an outdated cuff. Patient has had no new life changes. She has been taking her lisinopril  40mg  at bedtime appropriately. Unable to begin treatment for OSA with a CPAP. Increasing BP medication not appropriate at this time, would like for patient to continue on current regimen.  -Ordered BP cuff through [facility] which Medicaid will cover 100%. If not due to secondary coverage, explained to patient to call Medicare and see if/what they will cover regarding a new BP cuff. Patient understands she may reach out anytime to the clinic for help regarding this. Once received, check BP only once a day and keep a diary to bring to office visits.  -Continue home lisinopril  40mg  at bedtime. Refill has been sent to pharmacy.  -Initial office BP in the 140s. Persistent HTN likely a factor of untreated OSA as well. Will reach out to home health to find out what happened to referral/CPAP machine and keep patient updated as I receive news.  -Obtain BMP today to monitor renal function.

## 2024-02-28 NOTE — Patient Instructions (Addendum)
 It was wonderful to see you today.  Please bring ALL of your medications with you to every visit.   Today we talked about:  Your high blood pressure. Continue to take your lisinopril  40mg  at bedtime. We will call you about your blood work results. Please begin CPAP therapy as soon as you are able to get the machine at home. We will work with home health to make that happen.   Your left foot pain. This is not concerning for a blood clot, which is unlikely since you are taking your blood thinner appropriately. Continue your medications as appropriate. You may continue to use arnica cream and massages on your foot at night since this helps. I have sent in voltaren  gel to your pharmacy as well.   We will see you back in clinic for a 1 month follow up on your HTN and OSA.  Thank you for choosing [facility].   Please call [phone] with any questions about today's appointment.  Please arrive at least 15 minutes prior to your scheduled appointments.   If you had blood work today, I will send you a MyChart message or a letter if results are normal. Otherwise, I will give you a call.   If you had a referral placed, they will call you to set up an appointment. Please give us  a call if you don't hear back in the next 2 weeks.   If you need additional refills before your next appointment, please call your pharmacy first.   You should follow up in our clinic in 1 month for HTN follow up.  Camie Dixons, DO Family Medicine

## 2024-02-29 LAB — BASIC METABOLIC PANEL WITH GFR
BUN/Creatinine Ratio: 18 (ref 12–28)
BUN: 16 mg/dL (ref 8–27)
CO2: 18 mmol/L — ABNORMAL LOW (ref 20–29)
Calcium: 9.1 mg/dL (ref 8.7–10.3)
Chloride: 104 mmol/L (ref 96–106)
Creatinine, Ser: 0.87 mg/dL (ref 0.57–1.00)
Glucose: 99 mg/dL (ref 70–99)
Potassium: 4 mmol/L (ref 3.5–5.2)
Sodium: 142 mmol/L (ref 134–144)
eGFR: 74 mL/min/1.73 (ref >59)

## 2024-03-01 ENCOUNTER — Telehealth: Payer: Self-pay

## 2024-03-01 NOTE — Telephone Encounter (Signed)
 Receipt confirmed by [employer].   Veronda Prude, RN

## 2024-03-01 NOTE — Telephone Encounter (Signed)
 Spoke to the patient regarding her BMP which is within normal limits and updated on home health. Sherry Anderson has reached out to [employer] and they have stated they will reach out to the patient with information and a delivery date for the CPAP. Advised patient to reach back out to office if she doesn't hear from [employer] within 1 week. Patient verbalizes understanding.

## 2024-03-06 DIAGNOSIS — Z7901 Long term (current) use of anticoagulants: Secondary | ICD-10-CM | POA: Diagnosis not present

## 2024-03-06 DIAGNOSIS — Z8249 Family history of ischemic heart disease and other diseases of the circulatory system: Secondary | ICD-10-CM | POA: Diagnosis not present

## 2024-03-06 DIAGNOSIS — E785 Hyperlipidemia, unspecified: Secondary | ICD-10-CM | POA: Diagnosis not present

## 2024-03-06 DIAGNOSIS — G4700 Insomnia, unspecified: Secondary | ICD-10-CM | POA: Diagnosis not present

## 2024-03-06 DIAGNOSIS — G4733 Obstructive sleep apnea (adult) (pediatric): Secondary | ICD-10-CM | POA: Diagnosis not present

## 2024-03-06 DIAGNOSIS — F324 Major depressive disorder, single episode, in partial remission: Secondary | ICD-10-CM | POA: Diagnosis not present

## 2024-03-06 DIAGNOSIS — E669 Obesity, unspecified: Secondary | ICD-10-CM | POA: Diagnosis not present

## 2024-03-06 DIAGNOSIS — Z833 Family history of diabetes mellitus: Secondary | ICD-10-CM | POA: Diagnosis not present

## 2024-03-06 DIAGNOSIS — I129 Hypertensive chronic kidney disease with stage 1 through stage 4 chronic kidney disease, or unspecified chronic kidney disease: Secondary | ICD-10-CM | POA: Diagnosis not present

## 2024-03-06 DIAGNOSIS — I2510 Atherosclerotic heart disease of native coronary artery without angina pectoris: Secondary | ICD-10-CM | POA: Diagnosis not present

## 2024-03-06 DIAGNOSIS — K219 Gastro-esophageal reflux disease without esophagitis: Secondary | ICD-10-CM | POA: Diagnosis not present

## 2024-03-06 DIAGNOSIS — M1990 Unspecified osteoarthritis, unspecified site: Secondary | ICD-10-CM | POA: Diagnosis not present

## 2024-03-16 DIAGNOSIS — G4733 Obstructive sleep apnea (adult) (pediatric): Secondary | ICD-10-CM | POA: Diagnosis not present

## 2024-04-06 ENCOUNTER — Other Ambulatory Visit: Payer: Self-pay | Admitting: Family Medicine

## 2024-04-08 ENCOUNTER — Other Ambulatory Visit: Payer: Self-pay

## 2024-04-08 DIAGNOSIS — I10 Essential (primary) hypertension: Secondary | ICD-10-CM

## 2024-04-16 DIAGNOSIS — G4733 Obstructive sleep apnea (adult) (pediatric): Secondary | ICD-10-CM | POA: Diagnosis not present

## 2024-04-25 ENCOUNTER — Other Ambulatory Visit: Payer: Self-pay | Admitting: Family Medicine

## 2024-05-04 ENCOUNTER — Other Ambulatory Visit: Payer: Self-pay | Admitting: Family Medicine

## 2024-05-06 ENCOUNTER — Other Ambulatory Visit: Payer: Self-pay | Admitting: Family Medicine

## 2024-05-16 DIAGNOSIS — G4733 Obstructive sleep apnea (adult) (pediatric): Secondary | ICD-10-CM | POA: Diagnosis not present

## 2024-05-23 NOTE — Progress Notes (Signed)
 KALYIAH SAINTIL                                          MRN: [id number]   05/23/2024   The [facility]BCI Quality Team Specialist reviewed this patient medical record for the purposes of chart review for care gap closure. The following were reviewed: chart review for care gap closure-controlling blood pressure.    VBCI Quality Team

## 2024-05-29 ENCOUNTER — Ambulatory Visit: Admitting: Family Medicine

## 2024-05-29 ENCOUNTER — Encounter: Payer: Self-pay | Admitting: Family Medicine

## 2024-05-29 VITALS — BP 135/81 | HR 72 | Ht 62.0 in | Wt 171.6 lb

## 2024-05-29 DIAGNOSIS — R8290 Unspecified abnormal findings in urine: Secondary | ICD-10-CM | POA: Diagnosis not present

## 2024-05-29 DIAGNOSIS — E038 Other specified hypothyroidism: Secondary | ICD-10-CM

## 2024-05-29 DIAGNOSIS — F39 Unspecified mood [affective] disorder: Secondary | ICD-10-CM

## 2024-05-29 DIAGNOSIS — I10 Essential (primary) hypertension: Secondary | ICD-10-CM

## 2024-05-29 DIAGNOSIS — Z23 Encounter for immunization: Secondary | ICD-10-CM | POA: Diagnosis not present

## 2024-05-29 DIAGNOSIS — R7303 Prediabetes: Secondary | ICD-10-CM | POA: Diagnosis not present

## 2024-05-29 DIAGNOSIS — Z0184 Encounter for antibody response examination: Secondary | ICD-10-CM

## 2024-05-29 DIAGNOSIS — K219 Gastro-esophageal reflux disease without esophagitis: Secondary | ICD-10-CM

## 2024-05-29 DIAGNOSIS — G4733 Obstructive sleep apnea (adult) (pediatric): Secondary | ICD-10-CM

## 2024-05-29 DIAGNOSIS — E782 Mixed hyperlipidemia: Secondary | ICD-10-CM

## 2024-05-29 DIAGNOSIS — Z0000 Encounter for general adult medical examination without abnormal findings: Secondary | ICD-10-CM

## 2024-05-29 LAB — POCT GLYCOSYLATED HEMOGLOBIN (HGB A1C): Hemoglobin A1C: 5.3 % (ref 4.0–5.6)

## 2024-05-29 LAB — POCT URINALYSIS DIP (MANUAL ENTRY)
Blood, UA: NEGATIVE
Glucose, UA: NEGATIVE mg/dL
Nitrite, UA: NEGATIVE
Protein Ur, POC: 100 mg/dL — AB
Spec Grav, UA: >=1.03 — AB (ref 1.010–1.025)
Urobilinogen, UA: 1 U/dL
pH, UA: 6 (ref 5.0–8.0)

## 2024-05-29 NOTE — Patient Instructions (Signed)
 It was great to see you again today.  Checking urine  Flu, COVID, pneumonia vaccines today  Checking labs - thyroid , prediabetes, cholesterol, hepatitis B immunity status  Be well, Dr. Donah

## 2024-05-29 NOTE — Progress Notes (Signed)
  Date of Visit: 05/29/2024   SUBJECTIVE:   HPI:  Discussed the use of AI scribe software for clinical note transcription with the patient, who gave verbal consent to proceed.  History of Present Illness Sherry Anderson is a 63 year old female with hypertension and sleep apnea who presents for follow-up.  Lower urinary tract symptoms - Lower abdominal discomfort for the past week - Change in urine odor for the past week - No dysuria - Occasional pruritus in the genital area - Persistent perineal wetness, uncertain if it is urine - History of hysterectomy  Obstructive sleep apnea - Uses CPAP machine most nights - Recent illness has affected CPAP usage but was using consistently prior to this URI and plans to resume - Improved energy levels and overall well-being with CPAP  Mood and psychiatric symptoms - taking quetiapine  50mg  at night and venlafaxine  XR 225mg  daily - mood doing well overall  Gastroesophageal reflux disease - taking protonix  40mg  daily with good control of symptoms   Hypertension  - Takes lisinopril  40 mg daily for blood pressure control  Hypothyroidism - taking levothyroxine  75mcg daily, due for TSH recheck   OBJECTIVE:   BP 135/81   Pulse 72   Ht 5' 2 (1.575 m)   Wt 171 lb 9.6 oz (77.8 kg)   LMP  (LMP Unknown) Comment: not sexually active  SpO2 96%   BMI 31.39 kg/m  Gen: no acute distress, pleasant cooperative well appearing HEENT: normocephalic, atraumatic  Heart: regular rate and rhythm, no murmur Lungs: clear to auscultation bilaterally, normal work of breathing Abdomen: soft, nontender to palpation, no masses or organomegaly, no suprapubic tenderness elicited with palpation Ext: no edema  ASSESSMENT/PLAN:   Assessment & Plan Subclinical hypothyroidism Check TSH, titrate levothyroxine  as needed based on results Prediabetes Update A1c today Abnormal urine odor UA without obvious signs of infection Had planned to culture urine  but was not able Will offer for patient to return to give another sample HYPERTENSION, BENIGN ESSENTIAL Blood pressure well-controlled on current medication with good home monitoring results. - Continue lisinopril  40 mg daily. - Monitor blood pressure at home. Mixed hyperlipidemia Update lipids today, continue statin Mood disorder Well controlled. Continue current medication regimen.  Gastroesophageal reflux disease, unspecified whether esophagitis present Well controlled. Continue current medication regimen.  OSA (obstructive sleep apnea) Improved energy and well-being with CPAP use, continue Routine adult health maintenance Due for routine vaccinations and screenings. Discussed and agreed to receive flu, COVID, and pneumonia vaccines. Blood work to include cholesterol and hemoglobin A1c. Check hepatitis B immunity status.    [place] J. Donah, MD [facility]

## 2024-05-30 LAB — LIPID PANEL
Chol/HDL Ratio: 3.2 ratio (ref 0.0–4.4)
Cholesterol, Total: 189 mg/dL (ref 100–199)
HDL: 59 mg/dL (ref >39)
LDL Chol Calc (NIH): 106 mg/dL — ABNORMAL HIGH (ref 0–99)
Triglycerides: 139 mg/dL (ref 0–149)
VLDL Cholesterol Cal: 24 mg/dL (ref 5–40)

## 2024-05-30 LAB — TSH: TSH: 6.42 u[IU]/mL — ABNORMAL HIGH (ref 0.450–4.500)

## 2024-05-30 LAB — HEPATITIS B SURFACE ANTIBODY, QUANTITATIVE: Hepatitis B Surf Ab Quant: <3.5 m[IU]/mL — ABNORMAL LOW

## 2024-05-31 NOTE — Assessment & Plan Note (Signed)
 Well controlled. Continue current medication regimen.

## 2024-05-31 NOTE — Assessment & Plan Note (Signed)
 Improved energy and well-being with CPAP use, continue

## 2024-05-31 NOTE — Assessment & Plan Note (Signed)
 Check TSH, titrate levothyroxine  as needed based on results

## 2024-05-31 NOTE — Assessment & Plan Note (Signed)
Update lipids today, continue statin 

## 2024-05-31 NOTE — Assessment & Plan Note (Signed)
 Blood pressure well-controlled on current medication with good home monitoring results. - Continue lisinopril  40 mg daily. - Monitor blood pressure at home.

## 2024-05-31 NOTE — Assessment & Plan Note (Signed)
Update A1c today 

## 2024-05-31 NOTE — Assessment & Plan Note (Signed)
 Due for routine vaccinations and screenings. Discussed and agreed to receive flu, COVID, and pneumonia vaccines. Blood work to include cholesterol and hemoglobin A1c. Check hepatitis B immunity status.

## 2024-06-26 ENCOUNTER — Encounter: Payer: Self-pay | Admitting: Family Medicine

## 2024-06-26 DIAGNOSIS — E038 Other specified hypothyroidism: Secondary | ICD-10-CM

## 2024-06-29 MED ORDER — LEVOTHYROXINE SODIUM 88 MCG PO TABS: 88.0000 ug | ORAL_TABLET | Freq: Every day | ORAL | 1 refills | Status: DC

## 2024-06-29 NOTE — Progress Notes (Signed)
 Sherry Anderson                                          MRN: [id number]   06/29/2024   The [facility] Quality Team Specialist reviewed this patient medical record for the purposes of chart review for care gap closure. The following were reviewed: abstraction for care gap closure-controlling blood pressure.    [facility] Quality Team

## 2024-06-29 NOTE — Telephone Encounter (Signed)
 Called patient, she is not having any urinary symptoms. No need for repeat specimen.   Reviewed recent labs, TSH mildly elevated so will increase levothyroxine  to . Recheck in 1 month, lab visit scheduled  Also reviewed nonimmune to Hep B, offered vaccine series, patient amenable, scheduled nurse visit coinciding with lab visit  Patient very appreciative  Laymon JINNY Legions, MD

## 2024-07-03 ENCOUNTER — Other Ambulatory Visit: Payer: Self-pay | Admitting: Family Medicine

## 2024-07-16 DIAGNOSIS — G4733 Obstructive sleep apnea (adult) (pediatric): Secondary | ICD-10-CM | POA: Diagnosis not present

## 2024-08-04 ENCOUNTER — Other Ambulatory Visit: Payer: Self-pay | Admitting: Family Medicine

## 2024-08-06 ENCOUNTER — Other Ambulatory Visit: Payer: Self-pay

## 2024-08-06 ENCOUNTER — Ambulatory Visit (INDEPENDENT_AMBULATORY_CARE_PROVIDER_SITE_OTHER): Payer: Self-pay

## 2024-08-06 DIAGNOSIS — Z23 Encounter for immunization: Secondary | ICD-10-CM

## 2024-08-06 DIAGNOSIS — E038 Other specified hypothyroidism: Secondary | ICD-10-CM | POA: Diagnosis not present

## 2024-08-06 NOTE — Progress Notes (Signed)
 Patient presents to nurse clinic for Hepatitis B vaccination. Administered in right deltoid without complication.   Scheduled patient for next injection on 09/03/24.  Patient also scheduled for lab visit today. Assisted patient to lab for lab work.   Chiquita JAYSON English, RN

## 2024-08-07 ENCOUNTER — Ambulatory Visit: Payer: Self-pay | Admitting: Family Medicine

## 2024-08-07 LAB — TSH: TSH: 2.87 u[IU]/mL (ref 0.450–4.500)

## 2024-08-07 MED ORDER — LEVOTHYROXINE SODIUM 88 MCG PO TABS: 88.0000 ug | ORAL_TABLET | Freq: Every day | ORAL | 3 refills | Status: DC

## 2024-08-09 ENCOUNTER — Other Ambulatory Visit: Payer: Self-pay | Admitting: Family Medicine

## 2024-08-09 DIAGNOSIS — F39 Unspecified mood [affective] disorder: Secondary | ICD-10-CM

## 2024-08-26 NOTE — Progress Notes (Unsigned)
" ° °  SUBJECTIVE:   CHIEF COMPLAINT / HPI:   Discussed the use of AI scribe software for clinical note transcription with the patient, who gave verbal consent to proceed.  History of Present Illness Sherry Anderson is a 64 year old female with hypertension who presents for blood pressure follow-up.  Hypertension - Blood pressure remains elevated despite current antihypertensive regimen. - Lisinopril  dose self increased from 40 mg to 80 mg daily for the past week. - Home blood pressure readings consistently above 170/82, with a recent value of 179/82. - Triamterene  previously discontinued during July hospitalization due to feeling very unwell while on the medication. - No leg swelling. - Unilateral headaches that worsen at night when lying down. - Associated with some vision changes.   OBJECTIVE:   BP (!) 148/90   Pulse 81   Ht 5' 2 (1.575 m)   Wt 174 lb 6.4 oz (79.1 kg)   LMP  (LMP Unknown) Comment: not sexually active  SpO2 95%   BMI 31.90 kg/m   Gen: well appearing, in NAD Card: RRR Lungs: CTAB Ext: WWP, no edema  ASSESSMENT/PLAN:   HYPERTENSION, BENIGN ESSENTIAL Hypertension remains uncontrolled on 80 mg lisinopril . Blood pressure consistently elevated. Previous adverse effects with triamterene . Headaches and vision changes possibly related to hypertension. - Reduced lisinopril  to 40 mg daily given max dose - Added hydrochlorothiazide . - Ordered lab work to assess kidney function and potassium levels. - Scheduled follow-up blood pressure check next week, anticipate regimen increase    Donald CHRISTELLA Lai, DO "

## 2024-08-27 ENCOUNTER — Encounter: Payer: Self-pay | Admitting: Family Medicine

## 2024-08-27 ENCOUNTER — Ambulatory Visit: Admitting: Family Medicine

## 2024-08-27 VITALS — BP 148/90 | HR 81 | Ht 62.0 in | Wt 174.4 lb

## 2024-08-27 DIAGNOSIS — I10 Essential (primary) hypertension: Secondary | ICD-10-CM | POA: Diagnosis not present

## 2024-08-27 MED ORDER — HYDROCHLOROTHIAZIDE 12.5 MG PO TABS: 12.5000 mg | ORAL_TABLET | Freq: Every day | ORAL | 0 refills | Status: AC

## 2024-08-27 NOTE — Assessment & Plan Note (Signed)
 Hypertension remains uncontrolled on 80 mg lisinopril . Blood pressure consistently elevated. Previous adverse effects with triamterene . Headaches and vision changes possibly related to hypertension. - Reduced lisinopril  to 40 mg daily given max dose - Added hydrochlorothiazide . - Ordered lab work to assess kidney function and potassium levels. - Scheduled follow-up blood pressure check next week, anticipate regimen increase

## 2024-08-27 NOTE — Patient Instructions (Addendum)
¡  Fue psychiatrist verte!  Nuestros planes para hoy: - Aadiremos un medicamento para la presin arterial, hidroclorotiazida. Tmalo diariamente por la maana. Contina tomando 40 mg de lisinopril . - Controla tu presin arterial en casa y lleva un registro de las [place]. Asegrate de estar sentado/a durante al menos 5 minutos antes de la medicin y de no estar con dolor ni estresado/a para obtener resultados ms precisos. Trae este registro a tu prxima cita. - Regresa en una semana.  Hoy te haremos algunos anlisis de laboratorio; los resultados estarn disponibles en tu cuenta de Clinical Cytogeneticist.  Cudate y, si presentas alguna inquietud, busca atencin mdica de inmediato.  Doctora Murice Barbar   It was great to see you!  Our plans for today:  - We are adding a blood pressure medication, hydrochlorothiazide . Take this daily in the morning. Continue your 40mg  of lisinopril . - Monitor your blood pressure at home and keep a log of your readings. Make sure to be seated for at least 5 minutes prior to testing and not in pain or worked up for the most accurate readings. Bring this log with you to follow up.  - Come back in 1 week.  We are checking some labs today, we will release these results to your MyChart.  Take care and seek immediate care sooner if you develop any concerns.   Dr. Dublin Grayer

## 2024-08-28 ENCOUNTER — Ambulatory Visit: Payer: Self-pay | Admitting: Family Medicine

## 2024-08-28 LAB — BASIC METABOLIC PANEL WITH GFR
BUN/Creatinine Ratio: 18 (ref 12–28)
BUN: 13 mg/dL (ref 8–27)
CO2: 19 mmol/L — ABNORMAL LOW (ref 20–29)
Calcium: 9.3 mg/dL (ref 8.7–10.3)
Chloride: 108 mmol/L — ABNORMAL HIGH (ref 96–106)
Creatinine, Ser: 0.74 mg/dL (ref 0.57–1.00)
Glucose: 101 mg/dL — ABNORMAL HIGH (ref 70–99)
Potassium: 4.2 mmol/L (ref 3.5–5.2)
Sodium: 143 mmol/L (ref 134–144)
eGFR: 90 mL/min/1.73

## 2024-09-02 NOTE — Progress Notes (Unsigned)
" ° °  SUBJECTIVE:   CHIEF COMPLAINT / HPI:   Discussed the use of AI scribe software for clinical note transcription with the patient, who gave verbal consent to proceed.  History of Present Illness Sherry Anderson is a 64 year old female with hypertension who presents for follow-up of her blood pressure management.  Hypertension - Home blood pressure has improved since starting a new medication at the last visit. - Current systolic readings are around 165-180 mmHg, with the lowest recorded at 165/82 mmHg at home. - Blood pressure remains elevated despite use of a new home blood pressure monitor. - tolerating medication ok.  Immunization status - Receiving hepatitis B vaccine series due to nonimmunity on laboratory testing from October. - Receiving the second dose of the hepatitis B vaccine today.   OBJECTIVE:   BP 129/74   Pulse 83   Wt 173 lb 6.4 oz (78.7 kg)   LMP  (LMP Unknown) Comment: not sexually active  SpO2 95%   BMI 31.72 kg/m   Gen: well appearing, in NAD Card: Reg rate Lungs: Comfortable WOB on RA Ext: WWP, no edema   ASSESSMENT/PLAN:   HYPERTENSION, BENIGN ESSENTIAL Discrepancy between home and office blood pressure readings. Office readings satisfactory. - Scheduled ambulatory blood pressure monitoring with pharmacist. - Checked kidney function and potassium levels given hydrochlorothiazide  start last week - Continue current antihypertensive medication regimen.   Immunization management (hepatitis B vaccine) Lack of immunity to hepatitis B. Receiving vaccination series. - Administered second dose of hepatitis B vaccine. - Schedule third dose in one month.   Donald CHRISTELLA Lai, DO "

## 2024-09-03 ENCOUNTER — Ambulatory Visit: Admitting: Family Medicine

## 2024-09-03 ENCOUNTER — Ambulatory Visit: Payer: Self-pay

## 2024-09-03 ENCOUNTER — Encounter: Payer: Self-pay | Admitting: Family Medicine

## 2024-09-03 VITALS — BP 129/74 | HR 83 | Wt 173.4 lb

## 2024-09-03 DIAGNOSIS — I10 Essential (primary) hypertension: Secondary | ICD-10-CM | POA: Diagnosis not present

## 2024-09-03 DIAGNOSIS — Z23 Encounter for immunization: Secondary | ICD-10-CM | POA: Diagnosis not present

## 2024-09-03 NOTE — Addendum Note (Signed)
 Addended by: ELIBERTO BLACKBIRD A on: 09/03/2024 09:05 AM   Modules accepted: Orders

## 2024-09-03 NOTE — Patient Instructions (Signed)
 It was great to see you!  Our plans for today:  - Make an appointment to see our pharmacist for ambulatory blood pressure monitoring.  - no changes to your medications.  - We are checking some labs today, we will release these results to your MyChart.  Take care and seek immediate care sooner if you develop any concerns.   Dr. Jasier Calabretta

## 2024-09-03 NOTE — Assessment & Plan Note (Signed)
 Discrepancy between home and office blood pressure readings. Office readings satisfactory. - Scheduled ambulatory blood pressure monitoring with pharmacist. - Checked kidney function and potassium levels given hydrochlorothiazide  start last week - Continue current antihypertensive medication regimen.

## 2024-09-04 ENCOUNTER — Ambulatory Visit: Payer: Self-pay | Admitting: Family Medicine

## 2024-09-04 LAB — BASIC METABOLIC PANEL WITH GFR
BUN/Creatinine Ratio: 14 (ref 12–28)
BUN: 12 mg/dL (ref 8–27)
CO2: 22 mmol/L (ref 20–29)
Calcium: 9.1 mg/dL (ref 8.7–10.3)
Chloride: 102 mmol/L (ref 96–106)
Creatinine, Ser: 0.86 mg/dL (ref 0.57–1.00)
Glucose: 100 mg/dL — ABNORMAL HIGH (ref 70–99)
Potassium: 4 mmol/L (ref 3.5–5.2)
Sodium: 141 mmol/L (ref 134–144)
eGFR: 75 mL/min/1.73

## 2024-09-06 ENCOUNTER — Encounter: Payer: Self-pay | Admitting: Pharmacist

## 2024-09-06 ENCOUNTER — Ambulatory Visit (INDEPENDENT_AMBULATORY_CARE_PROVIDER_SITE_OTHER): Admitting: Pharmacist

## 2024-09-06 VITALS — BP 127/76 | HR 69 | Wt 172.8 lb

## 2024-09-06 DIAGNOSIS — I10 Essential (primary) hypertension: Secondary | ICD-10-CM | POA: Diagnosis not present

## 2024-09-06 DIAGNOSIS — F39 Unspecified mood [affective] disorder: Secondary | ICD-10-CM | POA: Diagnosis not present

## 2024-09-06 MED ORDER — LEVOTHYROXINE SODIUM 88 MCG PO TABS: 88.0000 ug | ORAL_TABLET | Freq: Every day | ORAL | 3 refills | Status: AC

## 2024-09-06 MED ORDER — VENLAFAXINE HCL ER 75 MG PO CP24: ORAL_CAPSULE | ORAL | 2 refills | Status: DC

## 2024-09-06 NOTE — Progress Notes (Addendum)
 "  S:     Chief Complaint  Patient presents with   Medication Management    Ambulatory Bp   64 y.o. female who presents for hypertension evaluation, education, and management. Patient arrives in  good spirits and presents without any assistance.  Patient was referred and last seen by Primary Care Provider, Dr. Madelon, on 09/03/24.   PMH is significant for hypertension, DVT, GERD, mood disorder.   Diagnosed with Hypertension in 25-30 years ago.    Discussed procedure for wearing the monitor and gave patient written instructions. Monitor was placed on non-dominant arm with instructions to return in the morning.   Current BP Medications include: lisinopril  40mg  daily and hydrochlorothiazide  12.5mg  daily   Antihypertensives tried in the past include: triamterene  hydrochlorothiazide  37.5 - 25 mg    O:  Review of Systems  Psychiatric/Behavioral:  Positive for depression.   All other systems reviewed and are negative.   Physical Exam Constitutional:      Appearance: Normal appearance.  Pulmonary:     Effort: Pulmonary effort is normal.  Neurological:     Mental Status: She is alert.  Psychiatric:        Mood and Affect: Mood normal.        Behavior: Behavior normal.        Thought Content: Thought content normal.        Judgment: Judgment normal.     Last 3 Office BP readings: BP Readings from Last 3 Encounters:  09/06/24 127/76  09/03/24 129/74  08/27/24 (!) 148/90    Clinical Atherosclerotic Cardiovascular Disease (ASCVD): No  The 10-year ASCVD risk score (Arnett DK, et al., 2019) is: 6.2%   Values used to calculate the score:     Age: 1 years     Clinically relevant sex: Female     Is Non-Hispanic African American: No     Diabetic: No     Tobacco smoker: No     Systolic Blood Pressure: 127 mmHg     Is BP treated: Yes     HDL Cholesterol: 59 mg/dL     Total Cholesterol: 189 mg/dL  Basic Metabolic Panel    Component Value Date/Time   NA 141 09/03/2024  0906   K 4.0 09/03/2024 0906   CL 102 09/03/2024 0906   CO2 22 09/03/2024 0906   GLUCOSE 100 (H) 09/03/2024 0906   GLUCOSE 91 11/02/2023 1049   BUN 12 09/03/2024 0906   CREATININE 0.86 09/03/2024 0906   CREATININE 0.69 11/02/2023 1049   CREATININE 1.07 (H) 06/30/2020 1547   CALCIUM  9.1 09/03/2024 0906   GFRNONAA >60 11/02/2023 1049   GFRNONAA 76 07/27/2016 1001   GFRAA 76 05/26/2020 0908   GFRAA 87 07/27/2016 1001    Renal function: Estimated Creatinine Clearance: 64.1 mL/min (by C-G formula based on SCr of 0.86 mg/dL).   ABPM Study Data: Arm Placement right arm  For Office Goal BP of <130/80 mmHg:  ABPM thresholds: Overall BP <125/75 mmHg, daytime BP <130/80 mmHg, sleeptime BP <110/65 mmHg     A/P: History of hypertension longstanding 25-30 years currently taking; lisinopril  40 mg daily, hydrochlorothiazide  12.5 mg daily with goal presssure of <130/80 mm Hg. Use of venlafaxine  may be contributing to blood pressure control.  Home monitor results of elevated readings of >160 systolic, consistently are concerning.  -Placed blood pressure cuff, provided education, patient instructed to wear cuff for 24 hours and return tomorrow to review results. -Asked to bring recently purchased, arm Blood Pressure monitor to  visit tomorrow for comparison to in-office device.     Written patient instructions provided including activity/symptom/event log. Patient verbalized understanding of plan. Total time in face to face counseling 26 minutes.    Follow-up: Tomorrow AM - early morning appointment 09/07/24 8:30 am  Patient seen with Sabra Schuller, PharmD Candidate - PY2 student and Megan McGill, PharmD Candidate - PY4 student.       Discussed patient the PCP, Dr. Donah.  Option of change from venlafaxine  to duloxetine to minimize impact of venlafaxine  on Blood Pressure was acceptable for patient to consider.  - Contacted patient - she shared that she has 1-2 days supply of venlafaxine   available.   I asked her to NOT fill the refill for that medication until we could discuss option of switching to alternative antidepressant tomorrow at Blood Pressure follow-up visit.  Patient agreed with plan.   "

## 2024-09-06 NOTE — Progress Notes (Signed)
 Reviewed and agree with Dr Rennis plan.

## 2024-09-06 NOTE — Assessment & Plan Note (Signed)
 History of hypertension longstanding 25-30 years currently taking; lisinopril  40 mg daily, hydrochlorothiazide  12.5 mg daily with goal presssure of <130/80 mm Hg. Use of venlafaxine  may be contributing to blood pressure control.  Home monitor results of elevated readings of >160 systolic, consistently are concerning.  -Placed blood pressure cuff, provided education, patient instructed to wear cuff for 24 hours and return tomorrow to review results. -Asked to bring recently purchased, arm Blood Pressure monitor to visit tomorrow for comparison to in-office device.

## 2024-09-06 NOTE — Patient Instructions (Addendum)
 Blood Pressure Activity Diary Time Lying down/ Sleeping Walking/ Exercise Stressed/ Angry Headache/ Pain Dizzy  9 AM       10 AM       11 AM       12 PM       1 PM       2 PM       Time Lying down/ Sleeping Walking/ Exercise Stressed/ Angry Headache/ Pain Dizzy  3 PM       4 PM        5 PM       6 PM       7 PM       8 PM       Time Lying down/ Sleeping Walking/ Exercise Stressed/ Angry Headache/ Pain Dizzy  9 PM       10 PM       11 PM       12 AM       1 AM       2 AM       3 AM       Time Lying down/ Sleeping Walking/ Exercise Stressed/ Angry Headache/ Pain Dizzy  4 AM       5 AM       6 AM       7 AM       8 AM       9 AM       10 AM        Time you woke up: _________                  Time you went to sleep:__________  Come back tomorrow at 8:30 a.m. to have the monitor removed  Call the [facility] if you have any questions before then ([phone])  Wearing the Blood Pressure Monitor The cuff will inflate every 20 minutes during the day and every 30 minutes while you sleep. Fill out the blood pressure-activity diary during the day, especially during activities that may affect your reading -- such as exercise, stress, walking, taking your blood pressure medications  Important things to know: Avoid taking the monitor off for the next 24 hours, unless it causes you discomfort or pain. Do NOT get the monitor wet and do NOT try to clean the monitor with any cleaning products. Do NOT put the monitor on anyone else's arm. When the cuff inflates, avoid excess movement. Let the cuffed arm hang loosely, slightly away from the body. Avoid flexing the muscles or moving the hand/fingers. Remember to fill out the blood pressure activity diary. If you experience severe pain or unusual pain (not associated with getting your blood pressure checked), remove the monitor.  Troubleshooting:  Code  Troubleshooting   1  Check cuff position, tighten cuff   2, 3  Remain  still during reading   4, 87  Check air hose connections and make sure cuff is tight   85, 89  Check hose connections and make tubing is not crimped   86  Push START/STOP to restart reading   88, 91  Retry by pushing START/STOP   90  Replace batteries. If problem persists, remove monitor and bring back to   clinic at follow up   97, 98, 99  Service required - Remove monitor and bring back to clinic at follow up

## 2024-09-07 ENCOUNTER — Ambulatory Visit: Admitting: Pharmacist

## 2024-09-07 ENCOUNTER — Encounter: Payer: Self-pay | Admitting: Pharmacist

## 2024-09-07 VITALS — BP 143/74 | HR 74

## 2024-09-07 DIAGNOSIS — F39 Unspecified mood [affective] disorder: Secondary | ICD-10-CM | POA: Diagnosis not present

## 2024-09-07 DIAGNOSIS — I10 Essential (primary) hypertension: Secondary | ICD-10-CM

## 2024-09-07 MED ORDER — DULOXETINE HCL 30 MG PO CPEP: 30.0000 mg | ORAL_CAPSULE | Freq: Every day | ORAL | 0 refills | Status: AC

## 2024-09-07 NOTE — Assessment & Plan Note (Signed)
 History of hypertension longstanding 25-30 years currently taking; lisinopril  40 mg daily, hydrochlorothiazide  12.5 mg daily with goal presssure of <130/80 mm Hg. Found to have isolated systolic hypertension with 24-hour ambulatory blood pressure evaluation which demonstrates an average AWAKE blood pressure of 143/74 mmHg.  Elevation in pressure possibly related to use of venlafaxine .  Nocturnal dipping pattern is normal.   Changes to medications -Continued lisinopril  40 mg and hydrochlorothiazide  12.5 mg daily  -Cross-tapered from venlafaxine  to duloxetine 30 mg daily over the next few days.  - Follow-up in 2 weeks to assess blood pressure control and need for medication adjustments.  - Consider combination thiazide at next visit. - Counseled patient to contact us  if she begins to experience dizziness.

## 2024-09-07 NOTE — Patient Instructions (Addendum)
 It was nice to see you today!  Thank you for completing the blood pressure monitoring evaluation.  Your goal blood pressure is < 130/80 mmHg   Medication Changes:  Decrease venlafaxine  to 2 tablets daily for 2-3 days then 1 tablet daily for 2-3 days until you are out of the medication. START duloxetine 1 tablet (30 mg) daily when you get to 1 tablet of venlafaxine  daily.   Continue all other medication the same.   Monitor blood pressure at home and keep a log (on a piece of paper) to bring with you to your next visit.

## 2024-09-07 NOTE — Progress Notes (Signed)
 "  S:     Chief Complaint  Patient presents with   Medication Management    Ambulatory Blood Pressure Monitor day 2   64 y.o. female who presents for hypertension evaluation, education, and management.  Patient arrives in good spirits and presents without any assistance.   Patient returns to clinic with 24 hour blood pressure monitor and reports they were able to wear the ambulatory blood pressure cuff for the entire 24 evaluation period.   O:  Review of Systems  All other systems reviewed and are negative.  Physical Exam Constitutional:      Appearance: Normal appearance.  Pulmonary:     Effort: Pulmonary effort is normal.  Neurological:     Mental Status: She is alert.  Psychiatric:        Mood and Affect: Mood normal.        Behavior: Behavior normal.        Thought Content: Thought content normal.        Judgment: Judgment normal.   Last 3 Office BP readings: BP Readings from Last 3 Encounters:  09/06/24 127/76  09/03/24 129/74  08/27/24 (!) 148/90   Clinical Atherosclerotic Cardiovascular Disease (ASCVD): No  The 10-year ASCVD risk score (Arnett DK, et al., 2019) is: 6.2%   Values used to calculate the score:     Age: 41 years     Clinically relevant sex: Female     Is Non-Hispanic African American: No     Diabetic: No     Tobacco smoker: No     Systolic Blood Pressure: 127 mmHg     Is BP treated: Yes     HDL Cholesterol: 59 mg/dL     Total Cholesterol: 189 mg/dL  Basic Metabolic Panel    Component Value Date/Time   NA 141 09/03/2024 0906   K 4.0 09/03/2024 0906   CL 102 09/03/2024 0906   CO2 22 09/03/2024 0906   GLUCOSE 100 (H) 09/03/2024 0906   GLUCOSE 91 11/02/2023 1049   BUN 12 09/03/2024 0906   CREATININE 0.86 09/03/2024 0906   CREATININE 0.69 11/02/2023 1049   CREATININE 1.07 (H) 06/30/2020 1547   CALCIUM  9.1 09/03/2024 0906   GFRNONAA >60 11/02/2023 1049   GFRNONAA 76 07/27/2016 1001   GFRAA 76 05/26/2020 0908   GFRAA 87 07/27/2016 1001     Renal function: Estimated Creatinine Clearance: 64.1 mL/min (by C-G formula based on SCr of 0.86 mg/dL).   ABPM Study Data: Arm Placement right arm  Overall Mean 24hr BP:   134/69 mmHg  HR: 74  Daytime Mean BP:  143/74 mmHg  HR: 79  Nighttime Mean BP:  119/61 mmHg  HR: 63  Dipping Pattern: Yes.    Sys:   16.7%   Dia: 17.7%  [normal dipping ~10-20%]  Patient is participating in a Managed Medicaid Plan:  Yes   A/P: History of hypertension longstanding 25-30 years currently taking; lisinopril  40 mg daily, hydrochlorothiazide  12.5 mg daily with goal presssure of <130/80 mm Hg. Found to have isolated systolic hypertension with 24-hour ambulatory blood pressure evaluation which demonstrates an average AWAKE blood pressure of 143/74 mmHg.  Elevation in pressure possibly related to use of venlafaxine .  Nocturnal dipping pattern is normal.   Changes to medications -Continued lisinopril  40 mg and hydrochlorothiazide  12.5 mg daily  -Cross-tapered from venlafaxine  to duloxetine 30 mg daily over the next few days.  - Follow-up in 2 weeks to assess blood pressure control and need for medication adjustments.  -  Consider combination thiazide at next visit. - Counseled patient to contact us  if she begins to experience dizziness.   Results reviewed and written information provided.    Written patient instructions provided. Patient verbalized understanding of treatment plan.  Total time in face to face counseling 29 minutes.    Follow-up:  Pharmacist 09/24/2024  PCP clinic visit in TBD  Patient seen with Sabra Schuller, PharmD Candidate - PY2 student and Megan McGill, PharmD Candidate - PY4 student.    "

## 2024-09-11 NOTE — Progress Notes (Signed)
 Reviewed and agree with Dr Rennis plan.

## 2024-09-17 ENCOUNTER — Encounter (HOSPITAL_COMMUNITY): Payer: Self-pay | Admitting: Student

## 2024-09-24 ENCOUNTER — Ambulatory Visit: Admitting: Pharmacist

## 2024-10-18 ENCOUNTER — Encounter
